# Patient Record
Sex: Female | Born: 1970 | Race: Black or African American | Hispanic: No | Smoking: Former smoker
Health system: Southern US, Community
[De-identification: ages and names within clinical notes are randomized; demographics above are authoritative.]

## PROBLEM LIST (undated history)

## (undated) DIAGNOSIS — B20 Human immunodeficiency virus [HIV] disease: Secondary | ICD-10-CM

## (undated) DIAGNOSIS — M1990 Unspecified osteoarthritis, unspecified site: Secondary | ICD-10-CM

## (undated) DIAGNOSIS — B9689 Other specified bacterial agents as the cause of diseases classified elsewhere: Secondary | ICD-10-CM

## (undated) DIAGNOSIS — F32A Depression, unspecified: Secondary | ICD-10-CM

## (undated) DIAGNOSIS — F329 Major depressive disorder, single episode, unspecified: Secondary | ICD-10-CM

## (undated) DIAGNOSIS — F419 Anxiety disorder, unspecified: Secondary | ICD-10-CM

## (undated) DIAGNOSIS — N760 Acute vaginitis: Secondary | ICD-10-CM

## (undated) DIAGNOSIS — G510 Bell's palsy: Secondary | ICD-10-CM

## (undated) DIAGNOSIS — E669 Obesity, unspecified: Secondary | ICD-10-CM

## (undated) DIAGNOSIS — I10 Essential (primary) hypertension: Secondary | ICD-10-CM

## (undated) HISTORY — PX: CHOLECYSTECTOMY: SHX55

---

## 2004-04-22 ENCOUNTER — Other Ambulatory Visit: Payer: Self-pay

## 2004-10-20 ENCOUNTER — Emergency Department: Payer: Self-pay | Admitting: Emergency Medicine

## 2004-12-23 ENCOUNTER — Emergency Department: Payer: Self-pay | Admitting: Unknown Physician Specialty

## 2005-03-15 ENCOUNTER — Emergency Department: Payer: Self-pay | Admitting: Internal Medicine

## 2005-03-26 ENCOUNTER — Emergency Department: Payer: Self-pay | Admitting: Emergency Medicine

## 2005-08-14 ENCOUNTER — Ambulatory Visit: Payer: Self-pay | Admitting: Family Medicine

## 2005-08-24 ENCOUNTER — Ambulatory Visit: Payer: Self-pay | Admitting: Family Medicine

## 2005-10-11 ENCOUNTER — Emergency Department: Payer: Self-pay | Admitting: Unknown Physician Specialty

## 2006-01-27 ENCOUNTER — Emergency Department: Payer: Self-pay | Admitting: Internal Medicine

## 2006-03-23 ENCOUNTER — Emergency Department: Payer: Self-pay | Admitting: General Practice

## 2006-06-22 ENCOUNTER — Emergency Department: Payer: Self-pay | Admitting: Emergency Medicine

## 2006-06-23 ENCOUNTER — Other Ambulatory Visit: Payer: Self-pay

## 2006-08-30 ENCOUNTER — Ambulatory Visit: Payer: Self-pay | Admitting: Internal Medicine

## 2006-08-31 ENCOUNTER — Emergency Department: Payer: Self-pay | Admitting: Emergency Medicine

## 2007-01-05 ENCOUNTER — Inpatient Hospital Stay: Payer: Self-pay | Admitting: Internal Medicine

## 2007-01-05 ENCOUNTER — Other Ambulatory Visit: Payer: Self-pay

## 2007-01-21 ENCOUNTER — Emergency Department: Payer: Self-pay | Admitting: Emergency Medicine

## 2007-01-31 ENCOUNTER — Emergency Department: Payer: Self-pay | Admitting: Emergency Medicine

## 2007-02-10 ENCOUNTER — Emergency Department: Payer: Self-pay | Admitting: Emergency Medicine

## 2007-03-22 ENCOUNTER — Other Ambulatory Visit: Payer: Self-pay

## 2007-03-22 ENCOUNTER — Emergency Department: Payer: Self-pay | Admitting: Emergency Medicine

## 2007-04-09 ENCOUNTER — Emergency Department: Payer: Self-pay | Admitting: Unknown Physician Specialty

## 2007-05-04 ENCOUNTER — Emergency Department: Payer: Self-pay | Admitting: Emergency Medicine

## 2007-05-04 ENCOUNTER — Other Ambulatory Visit: Payer: Self-pay

## 2007-05-11 ENCOUNTER — Other Ambulatory Visit: Payer: Self-pay

## 2007-05-11 ENCOUNTER — Emergency Department: Payer: Self-pay | Admitting: Emergency Medicine

## 2007-07-09 ENCOUNTER — Inpatient Hospital Stay: Payer: Self-pay | Admitting: Internal Medicine

## 2007-07-09 ENCOUNTER — Other Ambulatory Visit: Payer: Self-pay

## 2007-10-27 ENCOUNTER — Emergency Department: Payer: Self-pay | Admitting: Emergency Medicine

## 2007-10-27 ENCOUNTER — Other Ambulatory Visit: Payer: Self-pay

## 2007-11-23 ENCOUNTER — Emergency Department: Payer: Self-pay | Admitting: Emergency Medicine

## 2007-12-23 ENCOUNTER — Emergency Department: Payer: Self-pay | Admitting: Emergency Medicine

## 2008-01-08 ENCOUNTER — Other Ambulatory Visit: Payer: Self-pay

## 2008-01-08 ENCOUNTER — Emergency Department: Payer: Self-pay | Admitting: Emergency Medicine

## 2008-01-23 ENCOUNTER — Other Ambulatory Visit: Payer: Self-pay

## 2008-01-24 ENCOUNTER — Observation Stay: Payer: Self-pay | Admitting: Internal Medicine

## 2008-01-30 ENCOUNTER — Emergency Department: Payer: Self-pay | Admitting: Emergency Medicine

## 2008-01-30 ENCOUNTER — Other Ambulatory Visit: Payer: Self-pay

## 2008-06-12 ENCOUNTER — Emergency Department: Payer: Self-pay | Admitting: Emergency Medicine

## 2008-06-12 ENCOUNTER — Other Ambulatory Visit: Payer: Self-pay

## 2008-07-03 ENCOUNTER — Emergency Department: Payer: Self-pay | Admitting: Emergency Medicine

## 2008-07-03 ENCOUNTER — Other Ambulatory Visit: Payer: Self-pay

## 2008-08-24 ENCOUNTER — Other Ambulatory Visit: Payer: Self-pay

## 2008-08-24 ENCOUNTER — Emergency Department: Payer: Self-pay | Admitting: Emergency Medicine

## 2008-08-31 ENCOUNTER — Other Ambulatory Visit: Payer: Self-pay

## 2008-08-31 ENCOUNTER — Inpatient Hospital Stay: Payer: Self-pay | Admitting: Internal Medicine

## 2008-10-18 ENCOUNTER — Emergency Department: Payer: Self-pay | Admitting: Emergency Medicine

## 2008-11-04 ENCOUNTER — Emergency Department: Payer: Self-pay | Admitting: Emergency Medicine

## 2009-01-17 IMAGING — CR DG CHEST 1V PORT
1 series · 1 of 1 positions shown · non-contrast
Comparison: none

REASON FOR EXAM: SOB - RM 1
COMMENTS:

PROCEDURE:     DXR - DXR PORTABLE CHEST SINGLE VIEW  - July 09, 2007  [DATE]
RESULT:      The lung fields are clear. No pneumonia, pneumothorax or
pleural effusion is seen.  No interstitial or pulmonary edema is noted. The
heart size is normal.

[view not recorded]
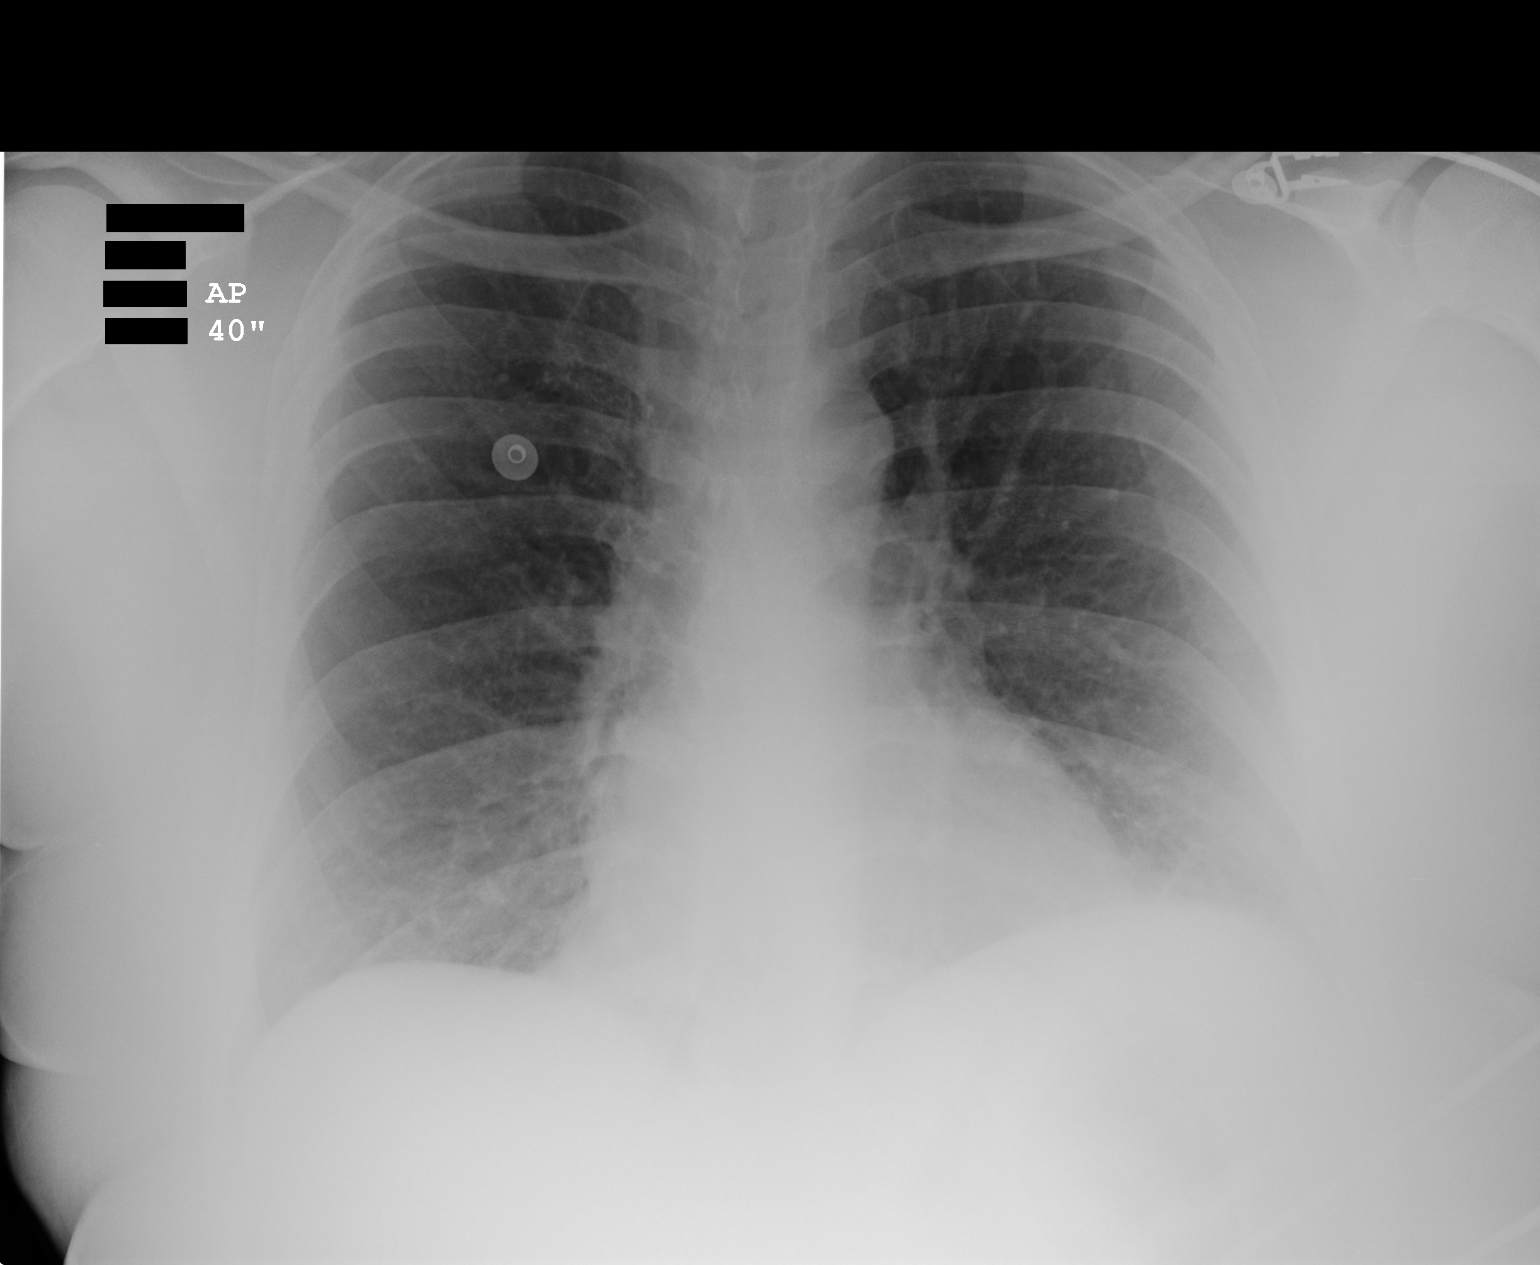

[1 of 1 positions shown; findings below may reference images not displayed]

IMPRESSION: No acute changes are identified.

## 2009-02-01 ENCOUNTER — Inpatient Hospital Stay: Payer: Self-pay | Admitting: Internal Medicine

## 2009-07-19 IMAGING — CR DG CHEST 2V
1 series · 2 of 2 positions shown · non-contrast
Comparison: none

REASON FOR EXAM: shortness of breath, chest pain
COMMENTS:

PROCEDURE:     DXR - DXR CHEST PA (OR AP) AND LATERAL  - January 08, 2008  [DATE]
RESULT:     Comparison: 12/23/2007.

[Series 1: view not recorded · 0.17mm/px · 2 of 2 slices shown]
[im 1/2]
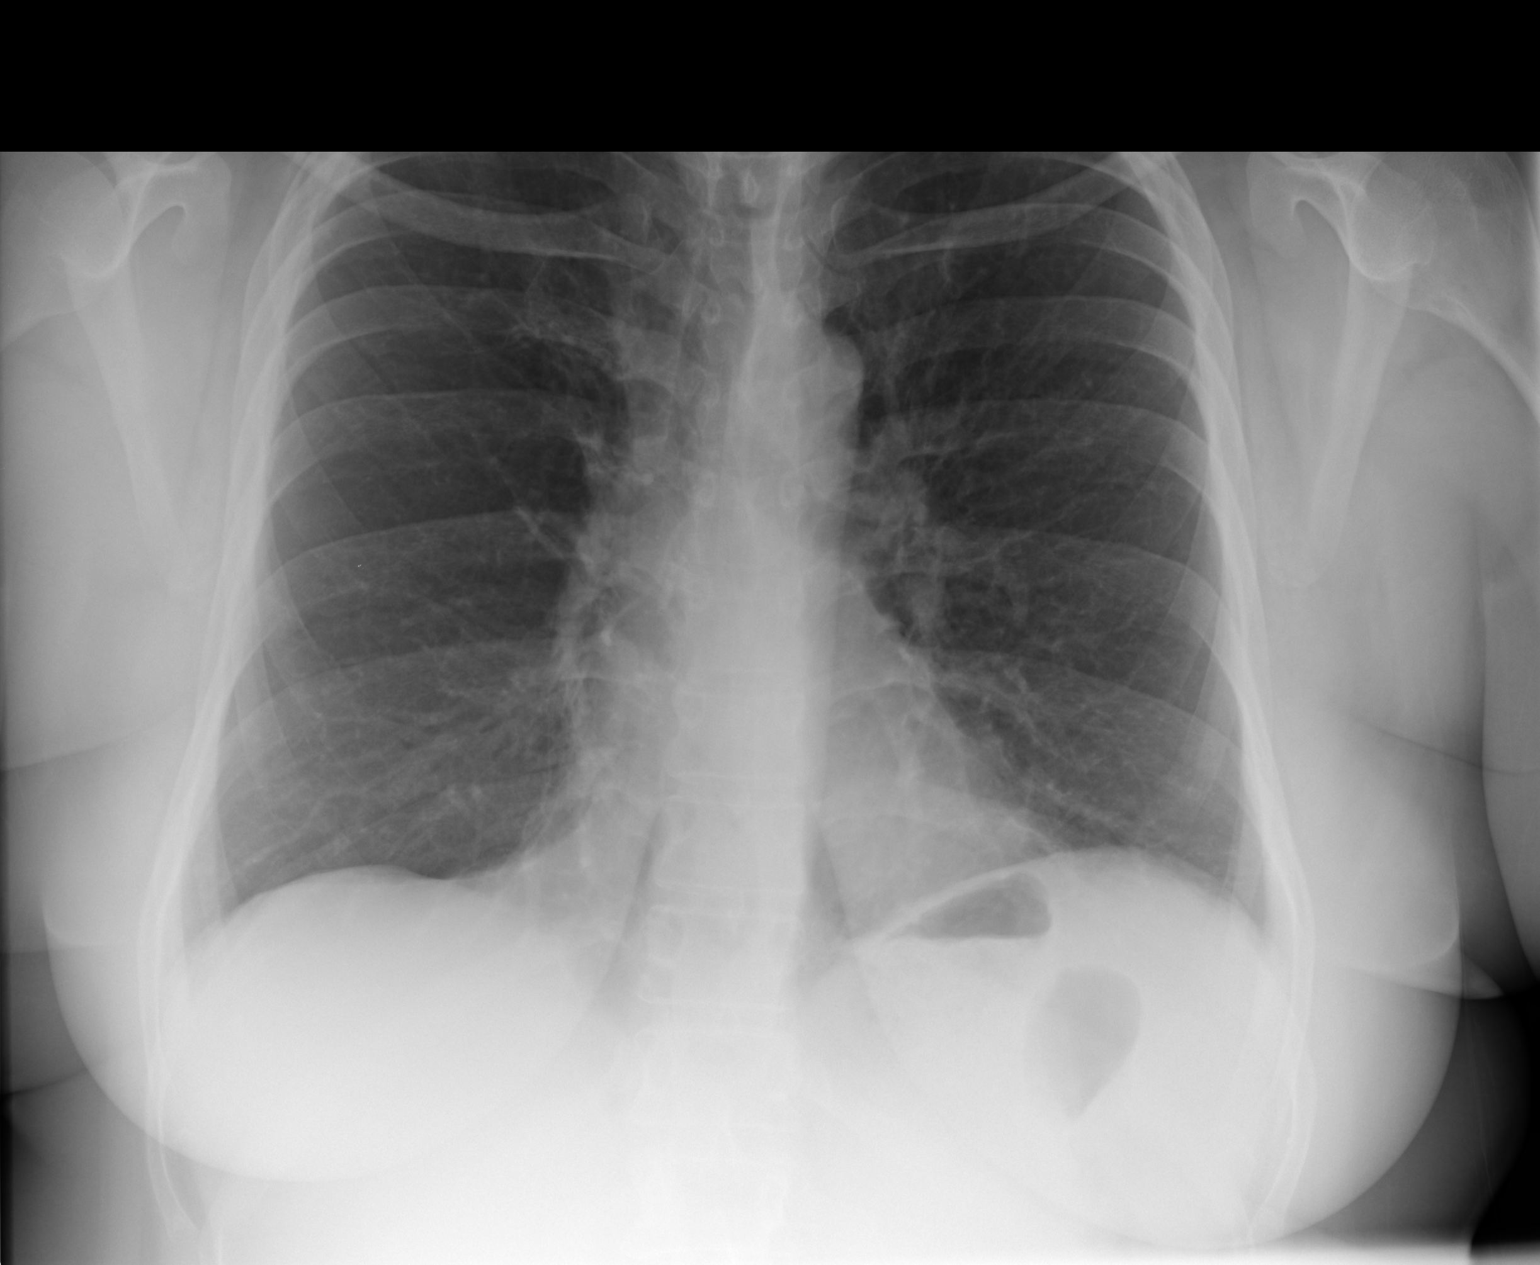
[im 2/2]
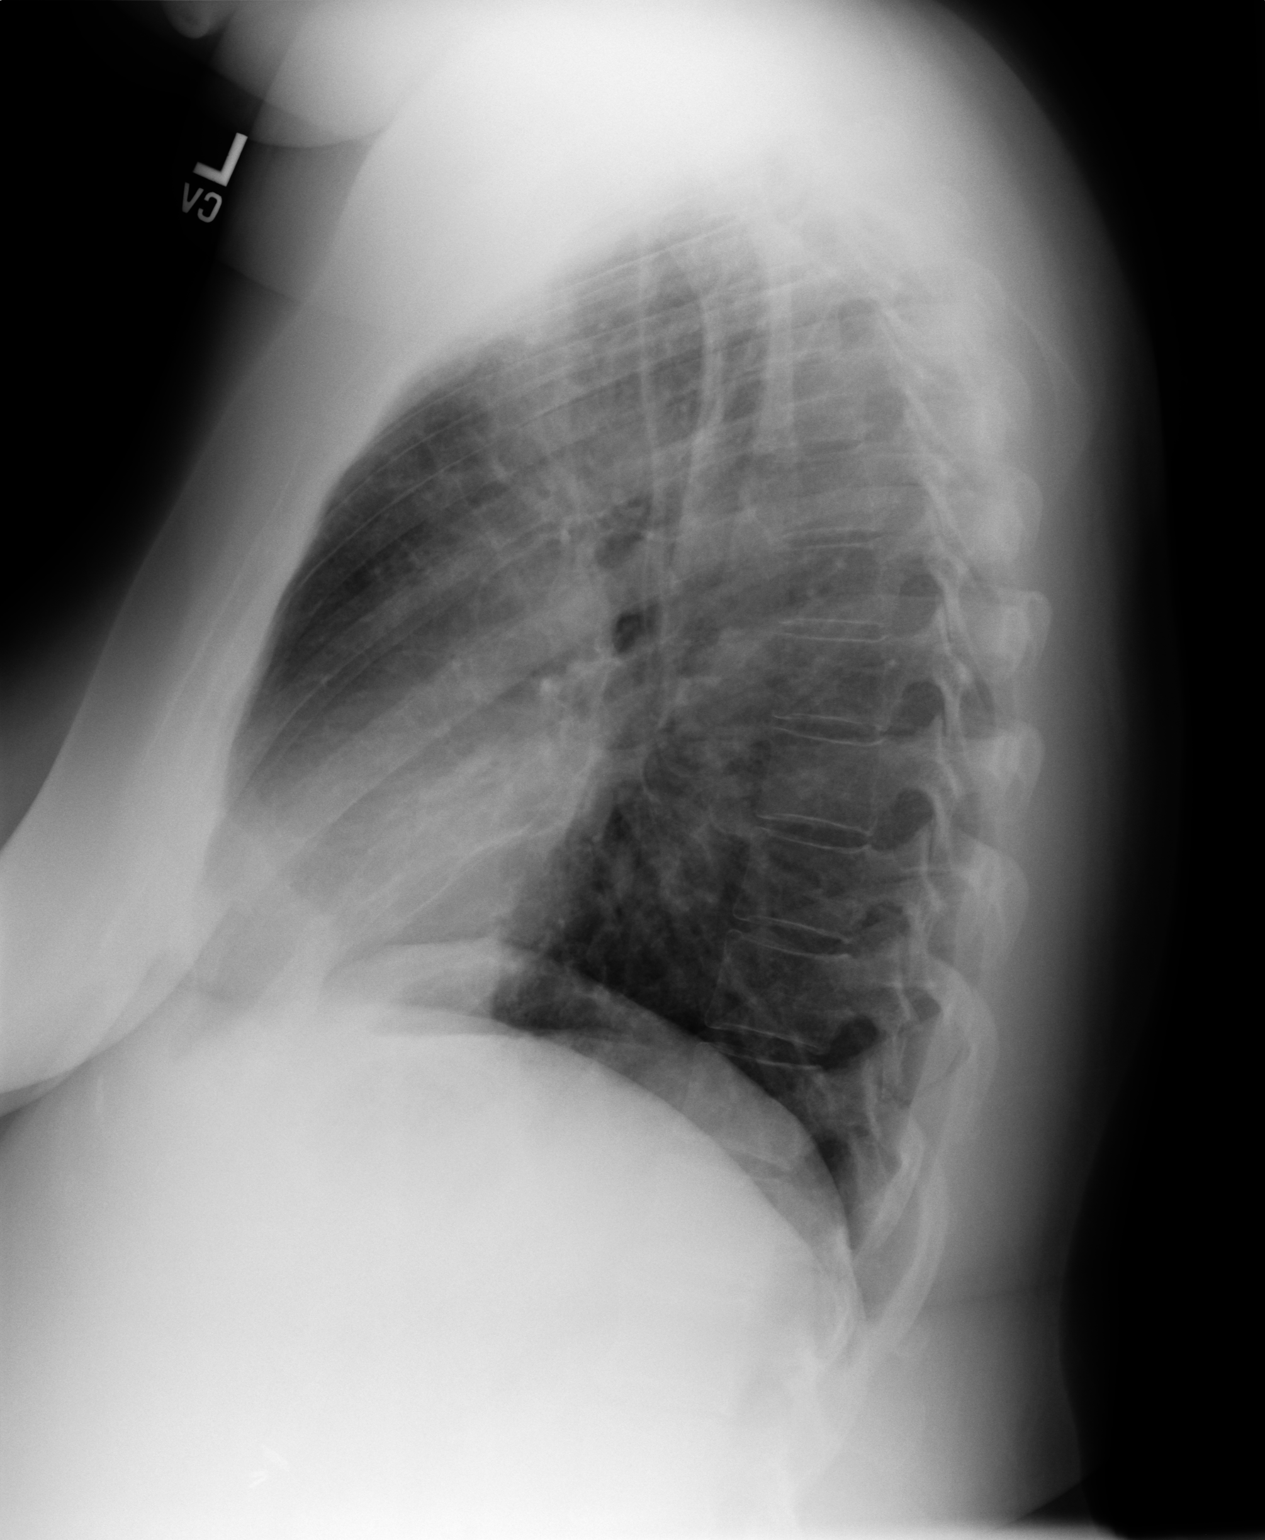

[2 of 2 positions shown; findings below may reference images not displayed]

FINDINGS: There is no significant pulmonary consolidation, pulmonary edema, pleural
effusion, nor pneumothorax. The cardiomediastinal silhouette is within
normal limits.

No grossly displaced rib fracture is noted.
IMPRESSION: 1. No acute cardiopulmonary abnormality is noted.

## 2010-02-25 ENCOUNTER — Emergency Department (HOSPITAL_COMMUNITY): Admission: EM | Admit: 2010-02-25 | Discharge: 2010-02-25 | Payer: Self-pay | Admitting: Emergency Medicine

## 2010-04-20 ENCOUNTER — Encounter: Admission: RE | Admit: 2010-04-20 | Discharge: 2010-04-20 | Payer: Self-pay | Admitting: Internal Medicine

## 2010-06-07 ENCOUNTER — Encounter: Admission: RE | Admit: 2010-06-07 | Discharge: 2010-06-07 | Payer: Self-pay | Admitting: Internal Medicine

## 2010-07-22 ENCOUNTER — Inpatient Hospital Stay (HOSPITAL_COMMUNITY): Admission: EM | Admit: 2010-07-22 | Discharge: 2010-07-24 | Payer: Self-pay | Admitting: Emergency Medicine

## 2010-07-24 ENCOUNTER — Encounter (INDEPENDENT_AMBULATORY_CARE_PROVIDER_SITE_OTHER): Payer: Self-pay | Admitting: Internal Medicine

## 2010-07-24 ENCOUNTER — Ambulatory Visit: Payer: Self-pay | Admitting: Surgery

## 2010-11-28 ENCOUNTER — Encounter: Admission: RE | Admit: 2010-11-28 | Discharge: 2010-11-28 | Payer: Self-pay | Admitting: Internal Medicine

## 2011-01-14 ENCOUNTER — Encounter: Payer: Self-pay | Admitting: Internal Medicine

## 2011-01-15 ENCOUNTER — Emergency Department (HOSPITAL_COMMUNITY)
Admission: EM | Admit: 2011-01-15 | Discharge: 2011-01-15 | Payer: Self-pay | Source: Home / Self Care | Admitting: Emergency Medicine

## 2011-01-16 LAB — URINALYSIS, ROUTINE W REFLEX MICROSCOPIC
Bilirubin Urine: NEGATIVE
Hgb urine dipstick: NEGATIVE
Ketones, ur: NEGATIVE mg/dL
Protein, ur: NEGATIVE mg/dL
Urobilinogen, UA: 1 mg/dL (ref 0.0–1.0)

## 2011-03-09 LAB — BASIC METABOLIC PANEL
BUN: 9 mg/dL (ref 6–23)
CO2: 27 mEq/L (ref 19–32)
Calcium: 8.6 mg/dL (ref 8.4–10.5)
Creatinine, Ser: 0.73 mg/dL (ref 0.4–1.2)
GFR calc Af Amer: >60 mL/min (ref >60)
Glucose, Bld: 299 mg/dL — ABNORMAL HIGH (ref 70–99)

## 2011-03-09 LAB — CBC
MCH: 24.7 pg — ABNORMAL LOW (ref 26.0–34.0)
MCHC: 32.3 g/dL (ref 30.0–36.0)
Platelets: 537 10*3/uL — ABNORMAL HIGH (ref 150–400)
RDW: 16 % — ABNORMAL HIGH (ref 11.5–15.5)

## 2011-03-09 LAB — GLUCOSE, CAPILLARY: Glucose-Capillary: 282 mg/dL — ABNORMAL HIGH (ref 70–99)

## 2011-03-10 LAB — BASIC METABOLIC PANEL
CO2: 24 mEq/L (ref 19–32)
Calcium: 8.6 mg/dL (ref 8.4–10.5)
Creatinine, Ser: 0.64 mg/dL (ref 0.4–1.2)
GFR calc Af Amer: >60 mL/min (ref >60)
GFR calc non Af Amer: >60 mL/min (ref >60)
Sodium: 135 mEq/L (ref 135–145)

## 2011-03-10 LAB — DIFFERENTIAL
Basophils Relative: 0 % (ref 0–1)
Eosinophils Absolute: 0 10*3/uL (ref 0.0–0.7)
Eosinophils Absolute: 0 10*3/uL (ref 0.0–0.7)
Eosinophils Relative: 0 % (ref 0–5)
Lymphocytes Relative: 12 % (ref 12–46)
Lymphs Abs: 0.9 10*3/uL (ref 0.7–4.0)
Monocytes Relative: 1 % — ABNORMAL LOW (ref 3–12)
Neutro Abs: 6.7 10*3/uL (ref 1.7–7.7)
Neutrophils Relative %: 87 % — ABNORMAL HIGH (ref 43–77)
Neutrophils Relative %: 95 % — ABNORMAL HIGH (ref 43–77)

## 2011-03-10 LAB — COMPREHENSIVE METABOLIC PANEL
ALT: 9 U/L (ref 0–35)
AST: 14 U/L (ref 0–37)
Albumin: 2.8 g/dL — ABNORMAL LOW (ref 3.5–5.2)
Alkaline Phosphatase: 92 U/L (ref 39–117)
CO2: 25 mEq/L (ref 19–32)
Chloride: 105 mEq/L (ref 96–112)
GFR calc Af Amer: >60 mL/min (ref >60)
GFR calc non Af Amer: >60 mL/min (ref >60)
Potassium: 4.2 mEq/L (ref 3.5–5.1)
Sodium: 137 mEq/L (ref 135–145)
Total Bilirubin: 0.2 mg/dL — ABNORMAL LOW (ref 0.3–1.2)

## 2011-03-10 LAB — POCT I-STAT, CHEM 8
BUN: 7 mg/dL (ref 6–23)
Creatinine, Ser: 0.5 mg/dL (ref 0.4–1.2)
Glucose, Bld: 288 mg/dL — ABNORMAL HIGH (ref 70–99)
Hemoglobin: 12.9 g/dL (ref 12.0–15.0)
Potassium: 3.8 mEq/L (ref 3.5–5.1)
TCO2: 21 mmol/L (ref 0–100)

## 2011-03-10 LAB — GLUCOSE, CAPILLARY
Glucose-Capillary: 202 mg/dL — ABNORMAL HIGH (ref 70–99)
Glucose-Capillary: 211 mg/dL — ABNORMAL HIGH (ref 70–99)
Glucose-Capillary: 259 mg/dL — ABNORMAL HIGH (ref 70–99)
Glucose-Capillary: 361 mg/dL — ABNORMAL HIGH (ref 70–99)

## 2011-03-10 LAB — CBC
Hemoglobin: 11.4 g/dL — ABNORMAL LOW (ref 12.0–15.0)
Hemoglobin: 11.8 g/dL — ABNORMAL LOW (ref 12.0–15.0)
MCH: 24.6 pg — ABNORMAL LOW (ref 26.0–34.0)
MCH: 24.6 pg — ABNORMAL LOW (ref 26.0–34.0)
MCH: 24.8 pg — ABNORMAL LOW (ref 26.0–34.0)
MCHC: 32.8 g/dL (ref 30.0–36.0)
MCV: 75 fL — ABNORMAL LOW (ref 78.0–100.0)
Platelets: 487 10*3/uL — ABNORMAL HIGH (ref 150–400)
Platelets: 495 10*3/uL — ABNORMAL HIGH (ref 150–400)
RBC: 4.58 MIL/uL (ref 3.87–5.11)
RBC: 4.78 MIL/uL (ref 3.87–5.11)
WBC: 19.7 10*3/uL — ABNORMAL HIGH (ref 4.0–10.5)
WBC: 7.7 10*3/uL (ref 4.0–10.5)

## 2011-03-10 LAB — POCT CARDIAC MARKERS: Myoglobin, poc: 44.1 ng/mL (ref 12–200)

## 2011-03-10 LAB — TROPONIN I: Troponin I: 0.01 ng/mL (ref 0.00–0.06)

## 2011-03-10 LAB — CARDIAC PANEL(CRET KIN+CKTOT+MB+TROPI)
CK, MB: 1.3 ng/mL (ref 0.3–4.0)
Total CK: 78 U/L (ref 7–177)

## 2011-03-10 LAB — CK TOTAL AND CKMB (NOT AT ARMC)
CK, MB: 1.4 ng/mL (ref 0.3–4.0)
Total CK: 95 U/L (ref 7–177)

## 2011-03-10 LAB — HEMOGLOBIN A1C: Hgb A1c MFr Bld: 6.5 % — ABNORMAL HIGH (ref <5.7)

## 2011-06-13 ENCOUNTER — Other Ambulatory Visit: Payer: Self-pay | Admitting: Internal Medicine

## 2011-06-13 DIAGNOSIS — N63 Unspecified lump in unspecified breast: Secondary | ICD-10-CM

## 2011-06-21 ENCOUNTER — Ambulatory Visit
Admission: RE | Admit: 2011-06-21 | Discharge: 2011-06-21 | Disposition: A | Discharge status: Home / Self Care | Payer: Medicaid Other | Source: Ambulatory Visit | Attending: Internal Medicine | Admitting: Internal Medicine

## 2011-06-21 DIAGNOSIS — N63 Unspecified lump in unspecified breast: Secondary | ICD-10-CM

## 2011-08-15 ENCOUNTER — Ambulatory Visit (HOSPITAL_COMMUNITY)
Admission: RE | Admit: 2011-08-15 | Discharge: 2011-08-15 | Disposition: A | Discharge status: Home / Self Care | Payer: Medicaid Other | Source: Ambulatory Visit | Attending: Emergency Medicine | Admitting: Emergency Medicine

## 2011-08-15 ENCOUNTER — Inpatient Hospital Stay (INDEPENDENT_AMBULATORY_CARE_PROVIDER_SITE_OTHER)
Admission: RE | Admit: 2011-08-15 | Discharge: 2011-08-15 | Disposition: A | Discharge status: Home / Self Care | Payer: Medicaid Other | Source: Ambulatory Visit | Attending: Family Medicine | Admitting: Family Medicine

## 2011-08-15 DIAGNOSIS — I10 Essential (primary) hypertension: Secondary | ICD-10-CM | POA: Insufficient documentation

## 2011-08-15 DIAGNOSIS — M7989 Other specified soft tissue disorders: Secondary | ICD-10-CM | POA: Insufficient documentation

## 2011-08-15 DIAGNOSIS — J45909 Unspecified asthma, uncomplicated: Secondary | ICD-10-CM | POA: Insufficient documentation

## 2011-08-15 DIAGNOSIS — F3289 Other specified depressive episodes: Secondary | ICD-10-CM | POA: Insufficient documentation

## 2011-08-15 DIAGNOSIS — M79609 Pain in unspecified limb: Secondary | ICD-10-CM | POA: Insufficient documentation

## 2011-08-15 DIAGNOSIS — F329 Major depressive disorder, single episode, unspecified: Secondary | ICD-10-CM | POA: Insufficient documentation

## 2011-08-15 DIAGNOSIS — F411 Generalized anxiety disorder: Secondary | ICD-10-CM | POA: Insufficient documentation

## 2011-08-15 DIAGNOSIS — E119 Type 2 diabetes mellitus without complications: Secondary | ICD-10-CM | POA: Insufficient documentation

## 2011-08-20 ENCOUNTER — Other Ambulatory Visit: Payer: Self-pay | Admitting: Orthopedic Surgery

## 2011-08-20 DIAGNOSIS — R609 Edema, unspecified: Secondary | ICD-10-CM

## 2011-08-20 DIAGNOSIS — M25572 Pain in left ankle and joints of left foot: Secondary | ICD-10-CM

## 2011-08-28 ENCOUNTER — Ambulatory Visit
Admission: RE | Admit: 2011-08-28 | Discharge: 2011-08-28 | Disposition: A | Discharge status: Home / Self Care | Payer: Medicaid Other | Source: Ambulatory Visit | Attending: Orthopedic Surgery | Admitting: Orthopedic Surgery

## 2011-08-28 DIAGNOSIS — R609 Edema, unspecified: Secondary | ICD-10-CM

## 2011-08-28 DIAGNOSIS — M25572 Pain in left ankle and joints of left foot: Secondary | ICD-10-CM

## 2011-08-29 ENCOUNTER — Ambulatory Visit (HOSPITAL_COMMUNITY)
Admission: RE | Admit: 2011-08-29 | Discharge: 2011-08-29 | Disposition: A | Discharge status: Home / Self Care | Payer: Medicaid Other | Source: Ambulatory Visit | Attending: Emergency Medicine | Admitting: Emergency Medicine

## 2011-08-29 DIAGNOSIS — M79609 Pain in unspecified limb: Secondary | ICD-10-CM | POA: Insufficient documentation

## 2011-08-29 DIAGNOSIS — I10 Essential (primary) hypertension: Secondary | ICD-10-CM | POA: Insufficient documentation

## 2011-08-29 DIAGNOSIS — E119 Type 2 diabetes mellitus without complications: Secondary | ICD-10-CM | POA: Insufficient documentation

## 2011-12-29 ENCOUNTER — Emergency Department: Payer: Self-pay | Admitting: Emergency Medicine

## 2012-04-25 ENCOUNTER — Other Ambulatory Visit: Payer: Self-pay | Admitting: Internal Medicine

## 2012-04-25 DIAGNOSIS — Z1231 Encounter for screening mammogram for malignant neoplasm of breast: Secondary | ICD-10-CM

## 2012-05-07 ENCOUNTER — Ambulatory Visit: Payer: Self-pay

## 2012-05-10 ENCOUNTER — Encounter (HOSPITAL_COMMUNITY): Payer: Self-pay | Admitting: *Deleted

## 2012-05-10 ENCOUNTER — Emergency Department (HOSPITAL_COMMUNITY)
Admission: EM | Admit: 2012-05-10 | Discharge: 2012-05-11 | Disposition: A | Discharge status: Home / Self Care | Payer: Medicaid Other | Attending: Emergency Medicine | Admitting: Emergency Medicine

## 2012-05-10 DIAGNOSIS — J320 Chronic maxillary sinusitis: Secondary | ICD-10-CM | POA: Insufficient documentation

## 2012-05-10 DIAGNOSIS — R51 Headache: Secondary | ICD-10-CM | POA: Insufficient documentation

## 2012-05-10 DIAGNOSIS — Z79899 Other long term (current) drug therapy: Secondary | ICD-10-CM | POA: Insufficient documentation

## 2012-05-10 DIAGNOSIS — J45909 Unspecified asthma, uncomplicated: Secondary | ICD-10-CM | POA: Insufficient documentation

## 2012-05-10 DIAGNOSIS — M542 Cervicalgia: Secondary | ICD-10-CM

## 2012-05-10 DIAGNOSIS — M62838 Other muscle spasm: Secondary | ICD-10-CM | POA: Insufficient documentation

## 2012-05-10 DIAGNOSIS — G8929 Other chronic pain: Secondary | ICD-10-CM | POA: Insufficient documentation

## 2012-05-10 DIAGNOSIS — R0981 Nasal congestion: Secondary | ICD-10-CM

## 2012-05-10 DIAGNOSIS — M25579 Pain in unspecified ankle and joints of unspecified foot: Secondary | ICD-10-CM | POA: Insufficient documentation

## 2012-05-10 DIAGNOSIS — E119 Type 2 diabetes mellitus without complications: Secondary | ICD-10-CM | POA: Insufficient documentation

## 2012-05-10 DIAGNOSIS — J321 Chronic frontal sinusitis: Secondary | ICD-10-CM | POA: Insufficient documentation

## 2012-05-10 DIAGNOSIS — I10 Essential (primary) hypertension: Secondary | ICD-10-CM | POA: Insufficient documentation

## 2012-05-10 DIAGNOSIS — M129 Arthropathy, unspecified: Secondary | ICD-10-CM | POA: Insufficient documentation

## 2012-05-10 HISTORY — DX: Essential (primary) hypertension: I10

## 2012-05-10 HISTORY — DX: Unspecified osteoarthritis, unspecified site: M19.90

## 2012-05-10 MED ORDER — KETOROLAC TROMETHAMINE 30 MG/ML IJ SOLN
30.0000 mg | Freq: Once | INTRAMUSCULAR | Status: AC
  Administered 2012-05-10: 30 mg via INTRAMUSCULAR
  Filled 2012-05-10: qty 1

## 2012-05-10 MED ORDER — PSEUDOEPHEDRINE HCL ER 120 MG PO TB12
120.0000 mg | ORAL_TABLET | Freq: Two times a day (BID) | ORAL | Status: DC
  Administered 2012-05-10: 120 mg via ORAL
  Filled 2012-05-10: qty 1

## 2012-05-10 NOTE — ED Notes (Signed)
Pt alert, nad, c/o headache, onset was yesterday, pt speech clear, ambulates to triage, steady gait, noted, resp even unlabored, skin pwd

## 2012-05-10 NOTE — ED Provider Notes (Signed)
History     CSN: [id number]  Arrival date & time 05/10/12  2126   First MD Initiated Contact with Patient 05/10/12 2258      Chief Complaint  Patient presents with  . Headache    (Consider location/radiation/quality/duration/timing/severity/associated sxs/prior treatment) HPI Comments: Patient states that she has recurrent headaches, usually lasting 2 days.  She can take over-the-counter Tylenol with relief.  Today's headache has been there for 2 days.  She has not taken any Tylenol today.  She has facial tenderness and fullness, worse when she bends over and touches her toes.  She does have seasonal allergies, for which she takes serotonin without a decongestant.  She also is complaining of left side.  Pain.  She has pain in her left ankle from a "tendinitis" but her arm.  Has been painful as well.  Pain starts at the base of her neck and radiates down to her fingertips  Patient is a 41 y.o. female presenting with headaches. The history is provided by the patient.  Headache  This is a recurrent problem. The current episode started yesterday. The problem occurs constantly. The problem has not changed since onset.The pain is located in the frontal region. The quality of the pain is described as dull. The pain is at a severity of 5/10. The pain is moderate. The pain does not radiate. Pertinent negatives include no fever, no chest pressure, no syncope, no shortness of breath, no nausea and no vomiting. She has tried nothing for the symptoms.    Past Medical History  Diagnosis Date  . Diabetes mellitus   . Arthritis   . Asthma   . Hypertension     Past Surgical History  Procedure Date  . Cholecystectomy     History reviewed. No pertinent family history.  History  Substance Use Topics  . Smoking status: Never Smoker   . Smokeless tobacco: Not on file  . Alcohol Use:      2/day    OB History    Grav Para Term Preterm Abortions TAB SAB Ect Mult Living                   Review of Systems  Constitutional: Negative for fever and chills.  HENT: Positive for hearing loss, congestion, neck pain and sinus pressure. Negative for ear pain, rhinorrhea, trouble swallowing and neck stiffness.   Respiratory: Negative for shortness of breath and wheezing.   Cardiovascular: Negative for syncope.  Gastrointestinal: Negative for nausea and vomiting.  Musculoskeletal: Negative for back pain and joint swelling.  Skin: Negative for pallor, rash and wound.  Neurological: Positive for headaches. Negative for dizziness, weakness and numbness.    Allergies  Penicillins and Shellfish allergy  Home Medications   Current Outpatient Rx  Name Route Sig Dispense Refill  . ALBUTEROL SULFATE (2.5 MG/3ML) 0.083% IN NEBU Nebulization Take 2.5 mg by nebulization every 6 (six) hours as needed. For shortness of breath    . IPRATROPIUM-ALBUTEROL 18-103 MCG/ACT IN AERO Inhalation Inhale 2 puffs into the lungs 4 (four) times daily.    . BECLOMETHASONE DIPROPIONATE 80 MCG/ACT IN AERS Inhalation Inhale 1 puff into the lungs 2 (two) times daily.    Marland Kitchen CETIRIZINE HCL 10 MG PO TABS Oral Take 10 mg by mouth daily.    Marland Kitchen METFORMIN HCL 500 MG PO TABS Oral Take 500 mg by mouth 2 (two) times daily with a meal.    . NAPROXEN 500 MG PO TABS Oral Take 500 mg by mouth  2 (two) times daily with a meal.    . QUETIAPINE FUMARATE 100 MG PO TABS Oral Take 100-200 mg by mouth at bedtime.    . TRAZODONE HCL 150 MG PO TABS Oral Take 150 mg by mouth at bedtime.    Marland Kitchen PSEUDOEPHEDRINE HCL ER 120 MG PO TB12 Oral Take 1 tablet (120 mg total) by mouth 2 (two) times daily. 60 tablet 0  . TRAMADOL HCL 50 MG PO TABS Oral Take 1 tablet (50 mg total) by mouth every 6 (six) hours as needed for pain. 15 tablet 0    BP 112/64  Pulse 70  Temp(Src) 98.9 F (37.2 C) (Oral)  Resp 18  SpO2 98%  LMP 03/30/2012  Physical Exam  Constitutional: She appears well-developed and well-nourished.  HENT:  Head: Normocephalic.   Right Ear: External ear normal.  Left Ear: External ear normal.  Nose: Right sinus exhibits maxillary sinus tenderness and frontal sinus tenderness. Left sinus exhibits maxillary sinus tenderness and frontal sinus tenderness.  Mouth/Throat: No oropharyngeal exudate.  Neck:    Cardiovascular: Normal rate.   Pulmonary/Chest: Effort normal.  Musculoskeletal:       Arms: Neurological: She is alert.  Skin: Skin is warm and dry. No rash noted.    ED Course  Procedures (including critical care time)  Labs Reviewed - No data to display No results found.   1. Sinus congestion   2. Cervical muscle pain     Approximate one-hour after patient received by mouth Sudafed and IM Toradol.  She reports almost complete resolution of her headache and neck pain  MDM  Sinusitis, cervical neck spasm as well as chronic L ankle pain         Arman Filter, NP 05/11/12 0110

## 2012-05-10 NOTE — ED Notes (Addendum)
Pt with a headache for several days that comes and goes. Currently 6/10 in face and in left temporal area. Pt also complains of "achin' feeling" in entire left side of body. Pt appears comfortable. Denies n/v, denies runny nose. Reports an infrequent cough. Pt reports regular activity and appetite.

## 2012-05-11 MED ORDER — TRAMADOL HCL 50 MG PO TABS: 50.0000 mg | ORAL_TABLET | Freq: Four times a day (QID) | ORAL | Status: AC | PRN

## 2012-05-11 MED ORDER — PSEUDOEPHEDRINE HCL ER 120 MG PO TB12: 120.0000 mg | ORAL_TABLET | Freq: Two times a day (BID) | ORAL | Status: DC

## 2012-05-11 NOTE — Discharge Instructions (Signed)

## 2012-05-11 NOTE — ED Provider Notes (Signed)
Medical screening examination/treatment/procedure(s) were performed by non-physician practitioner and as supervising physician I was immediately available for consultation/collaboration.   Dwayne Bulkley A Rosan Calbert, MD 05/11/12 0733 

## 2012-05-23 ENCOUNTER — Ambulatory Visit
Admission: RE | Admit: 2012-05-23 | Discharge: 2012-05-23 | Disposition: A | Discharge status: Home / Self Care | Payer: Medicaid Other | Source: Ambulatory Visit | Attending: Internal Medicine | Admitting: Internal Medicine

## 2012-05-23 DIAGNOSIS — Z1231 Encounter for screening mammogram for malignant neoplasm of breast: Secondary | ICD-10-CM

## 2012-05-29 ENCOUNTER — Other Ambulatory Visit: Payer: Self-pay | Admitting: Internal Medicine

## 2012-05-29 DIAGNOSIS — R928 Other abnormal and inconclusive findings on diagnostic imaging of breast: Secondary | ICD-10-CM

## 2012-07-02 ENCOUNTER — Ambulatory Visit
Admission: RE | Admit: 2012-07-02 | Discharge: 2012-07-02 | Disposition: A | Discharge status: Home / Self Care | Payer: Medicaid Other | Source: Ambulatory Visit | Attending: Internal Medicine | Admitting: Internal Medicine

## 2012-07-02 DIAGNOSIS — R928 Other abnormal and inconclusive findings on diagnostic imaging of breast: Secondary | ICD-10-CM

## 2012-10-27 ENCOUNTER — Encounter (HOSPITAL_COMMUNITY): Payer: Self-pay | Admitting: Emergency Medicine

## 2012-10-27 ENCOUNTER — Emergency Department (HOSPITAL_COMMUNITY)
Admission: EM | Admit: 2012-10-27 | Discharge: 2012-10-28 | Disposition: A | Discharge status: Home / Self Care | Payer: Medicaid Other | Attending: Emergency Medicine | Admitting: Emergency Medicine

## 2012-10-27 DIAGNOSIS — Z791 Long term (current) use of non-steroidal anti-inflammatories (NSAID): Secondary | ICD-10-CM | POA: Insufficient documentation

## 2012-10-27 DIAGNOSIS — G510 Bell's palsy: Secondary | ICD-10-CM | POA: Insufficient documentation

## 2012-10-27 DIAGNOSIS — Z79899 Other long term (current) drug therapy: Secondary | ICD-10-CM | POA: Insufficient documentation

## 2012-10-27 DIAGNOSIS — IMO0002 Reserved for concepts with insufficient information to code with codable children: Secondary | ICD-10-CM | POA: Insufficient documentation

## 2012-10-27 DIAGNOSIS — F3289 Other specified depressive episodes: Secondary | ICD-10-CM | POA: Insufficient documentation

## 2012-10-27 DIAGNOSIS — F411 Generalized anxiety disorder: Secondary | ICD-10-CM | POA: Insufficient documentation

## 2012-10-27 DIAGNOSIS — E119 Type 2 diabetes mellitus without complications: Secondary | ICD-10-CM | POA: Insufficient documentation

## 2012-10-27 DIAGNOSIS — J45909 Unspecified asthma, uncomplicated: Secondary | ICD-10-CM | POA: Insufficient documentation

## 2012-10-27 DIAGNOSIS — M129 Arthropathy, unspecified: Secondary | ICD-10-CM | POA: Insufficient documentation

## 2012-10-27 DIAGNOSIS — I10 Essential (primary) hypertension: Secondary | ICD-10-CM | POA: Insufficient documentation

## 2012-10-27 DIAGNOSIS — F329 Major depressive disorder, single episode, unspecified: Secondary | ICD-10-CM | POA: Insufficient documentation

## 2012-10-27 HISTORY — DX: Anxiety disorder, unspecified: F41.9

## 2012-10-27 HISTORY — DX: Major depressive disorder, single episode, unspecified: F32.9

## 2012-10-27 HISTORY — DX: Depression, unspecified: F32.A

## 2012-10-27 NOTE — ED Notes (Signed)
PT. REPORTS RIGHT EYE TWITCHING ONSET THIS MORNING .

## 2012-10-28 ENCOUNTER — Emergency Department (HOSPITAL_COMMUNITY): Payer: Medicaid Other

## 2012-10-28 MED ORDER — ACYCLOVIR 200 MG PO CAPS
400.0000 mg | ORAL_CAPSULE | Freq: Once | ORAL | Status: AC
  Administered 2012-10-28: 400 mg via ORAL
  Filled 2012-10-28: qty 2

## 2012-10-28 MED ORDER — PREDNISONE 20 MG PO TABS
60.0000 mg | ORAL_TABLET | Freq: Once | ORAL | Status: AC
  Administered 2012-10-28: 60 mg via ORAL
  Filled 2012-10-28: qty 3

## 2012-10-28 MED ORDER — PREDNISONE 20 MG PO TABS: ORAL_TABLET | ORAL | Status: DC

## 2012-10-28 MED ORDER — ACYCLOVIR 400 MG PO TABS: 400.0000 mg | ORAL_TABLET | Freq: Four times a day (QID) | ORAL | Status: DC

## 2012-10-28 NOTE — ED Notes (Signed)
Pt denies blurred vision or change in visual acutiy

## 2012-10-28 NOTE — ED Provider Notes (Signed)
History     CSN: [id number]  Arrival date & time 10/27/12  2049   First MD Initiated Contact with Patient 10/27/12 2356      Chief Complaint  Patient presents with  . Eye Problem    (Consider location/radiation/quality/duration/timing/severity/associated sxs/prior treatment) HPI Comments: Patient states she woke with R facial droop Also states she is taking her HTN meds regularly does not have HA, nausea, weakness States she does drool when drinking but not having any difficulty swallowing.    Patient is a 41 y.o. female presenting with eye problem. The history is provided by the patient.  Eye Problem  This is a new problem. The problem occurs constantly. The problem has not changed since onset.The right eye is affected.There was no injury mechanism. Pertinent negatives include no numbness, no blurred vision, no decreased vision, no double vision, no foreign body sensation, no photophobia, no eye redness, no nausea, no vomiting, no tingling, no weakness and no itching.    Past Medical History  Diagnosis Date  . Diabetes mellitus   . Arthritis   . Asthma   . Hypertension   . Depression   . Anxiety     Past Surgical History  Procedure Date  . Cholecystectomy     No family history on file.  History  Substance Use Topics  . Smoking status: Never Smoker   . Smokeless tobacco: Not on file  . Alcohol Use: Yes     Comment: 2/day    OB History    Grav Para Term Preterm Abortions TAB SAB Ect Mult Living                  Review of Systems  Constitutional: Negative for fever and chills.  HENT: Negative for ear pain, sore throat, rhinorrhea and trouble swallowing.   Eyes: Negative for blurred vision, double vision, photophobia, redness and visual disturbance.  Gastrointestinal: Negative for nausea and vomiting.  Musculoskeletal: Negative for back pain, joint swelling, arthralgias and gait problem.  Skin: Negative for itching, rash and wound.  Neurological: Negative for  dizziness, tingling, weakness, numbness and headaches.    Allergies  Penicillins and Shellfish allergy  Home Medications   Current Outpatient Rx  Name  Route  Sig  Dispense  Refill  . ALBUTEROL SULFATE (2.5 MG/3ML) 0.083% IN NEBU   Nebulization   Take 2.5 mg by nebulization every 6 (six) hours as needed. For shortness of breath         . IPRATROPIUM-ALBUTEROL 18-103 MCG/ACT IN AERO   Inhalation   Inhale 2 puffs into the lungs 4 (four) times daily.         . BECLOMETHASONE DIPROPIONATE 80 MCG/ACT IN AERS   Inhalation   Inhale 1 puff into the lungs 2 (two) times daily.         Marland Kitchen CETIRIZINE HCL 10 MG PO TABS   Oral   Take 10 mg by mouth daily.         Marland Kitchen METFORMIN HCL 500 MG PO TABS   Oral   Take 500 mg by mouth 2 (two) times daily with a meal.         . NAPROXEN 500 MG PO TABS   Oral   Take 500 mg by mouth 2 (two) times daily with a meal.         . QUETIAPINE FUMARATE 100 MG PO TABS   Oral   Take 100 mg by mouth at bedtime as needed. When needed for sleep         .  TRAZODONE HCL 150 MG PO TABS   Oral   Take 150 mg by mouth at bedtime as needed. For sleep         . ACYCLOVIR 400 MG PO TABS   Oral   Take 1 tablet (400 mg total) by mouth 4 (four) times daily.   50 tablet   0   . PREDNISONE 20 MG PO TABS      3 Tabs PO Days 1-3, then 2 tabs PO Days 4-6, then 1 tab PO Day 7-9, then Half Tab PO Day 10-12   20 tablet   0     BP 156/88  Pulse 73  Temp 98.1 F (36.7 C) (Oral)  Resp 14  SpO2 97%  LMP 10/26/2012  Physical Exam  Constitutional: She appears well-developed and well-nourished.  HENT:  Head: Normocephalic.  Eyes: Pupils are equal, round, and reactive to light.  Neck: Normal range of motion.  Cardiovascular: Normal rate.   Pulmonary/Chest: Effort normal.  Musculoskeletal: Normal range of motion. She exhibits no edema and no tenderness.  Neurological: She is alert. Coordination normal.       R facial droop, tounge deviates to the R  can raise eyebrows smile asymetric   Skin: Skin is warm and dry.    ED Course  Procedures (including critical care time)  Labs Reviewed - No data to display Ct Head Wo Contrast  10/28/2012  *RADIOLOGY REPORT*  Clinical Data: Right facial droop.  CT HEAD WITHOUT CONTRAST  Technique:  Contiguous axial images were obtained from the base of the skull through the vertex without contrast.  Comparison: None.  Findings: Ventricular system normal in size and appearance for age. No mass lesion.  No midline shift.  No acute hemorrhage or hematoma.  No extra-axial fluid collections.  No evidence of acute infarction.  No focal brain parenchymal abnormalities.  No focal osseous abnormalities involving the skull.  Visualized paranasal sinuses, mastoid air cells, and middle ear cavities well- aerated.  IMPRESSION: Normal examination.   Original Report Authenticated By: Hulan Saas, M.D.      1. Bell's palsy       MDM   CT scan reviewed negative for stroke.  Discharge home with prednisone, taper, as well as a second layer to treat a Bell's palsy.  Encourage followup with her primary care physician.  This week       Arman Filter, NP 10/28/12 0202

## 2012-11-18 NOTE — ED Provider Notes (Signed)
Medical screening examination/treatment/procedure(s) were performed by non-physician practitioner and as supervising physician I was immediately available for consultation/collaboration.    Vida Roller, MD 11/18/12 [phone]

## 2012-11-26 ENCOUNTER — Ambulatory Visit: Payer: Medicaid Other | Attending: Internal Medicine | Admitting: Physical Therapy

## 2012-11-26 DIAGNOSIS — R29810 Facial weakness: Secondary | ICD-10-CM | POA: Insufficient documentation

## 2012-11-26 DIAGNOSIS — IMO0001 Reserved for inherently not codable concepts without codable children: Secondary | ICD-10-CM | POA: Insufficient documentation

## 2012-12-03 ENCOUNTER — Ambulatory Visit: Payer: Medicaid Other | Admitting: Physical Therapy

## 2012-12-15 ENCOUNTER — Ambulatory Visit: Payer: Medicaid Other | Admitting: Physical Therapy

## 2013-04-14 ENCOUNTER — Other Ambulatory Visit: Payer: Self-pay

## 2013-04-14 DIAGNOSIS — Z1231 Encounter for screening mammogram for malignant neoplasm of breast: Secondary | ICD-10-CM

## 2013-05-25 ENCOUNTER — Ambulatory Visit
Admission: RE | Admit: 2013-05-25 | Discharge: 2013-05-25 | Disposition: A | Discharge status: Home / Self Care | Payer: Medicaid Other | Source: Ambulatory Visit

## 2013-05-25 DIAGNOSIS — Z1231 Encounter for screening mammogram for malignant neoplasm of breast: Secondary | ICD-10-CM

## 2013-06-16 ENCOUNTER — Other Ambulatory Visit: Payer: Self-pay | Admitting: Internal Medicine

## 2013-06-16 DIAGNOSIS — M79605 Pain in left leg: Secondary | ICD-10-CM

## 2013-06-17 ENCOUNTER — Other Ambulatory Visit: Payer: Self-pay

## 2013-06-19 ENCOUNTER — Ambulatory Visit
Admission: RE | Admit: 2013-06-19 | Discharge: 2013-06-19 | Disposition: A | Discharge status: Home / Self Care | Payer: Medicaid Other | Source: Ambulatory Visit | Attending: Internal Medicine | Admitting: Internal Medicine

## 2013-06-19 DIAGNOSIS — M79605 Pain in left leg: Secondary | ICD-10-CM

## 2013-11-14 ENCOUNTER — Encounter (HOSPITAL_COMMUNITY): Payer: Self-pay | Admitting: Emergency Medicine

## 2013-11-14 ENCOUNTER — Emergency Department (INDEPENDENT_AMBULATORY_CARE_PROVIDER_SITE_OTHER)
Admission: EM | Admit: 2013-11-14 | Discharge: 2013-11-14 | Disposition: A | Discharge status: Home / Self Care | Payer: Medicaid Other | Source: Home / Self Care | Attending: Family Medicine | Admitting: Family Medicine

## 2013-11-14 DIAGNOSIS — E669 Obesity, unspecified: Secondary | ICD-10-CM

## 2013-11-14 DIAGNOSIS — N912 Amenorrhea, unspecified: Secondary | ICD-10-CM

## 2013-11-14 LAB — POCT URINALYSIS DIP (DEVICE)
Nitrite: NEGATIVE
Protein, ur: NEGATIVE mg/dL
pH: 6.5 (ref 5.0–8.0)

## 2013-11-14 NOTE — ED Provider Notes (Signed)
Latoya Roth is a 42 y.o. female who presents to Urgent Care today for amenorrhea since September. She has not had a period since September. She has had unprotected sex and could be pregnant. She has had 2 episodes of vomiting. She feels well currently no fevers or chills dysuria nausea vomiting or diarrhea. She has not checked for pregnancy since October.  She is G2P2001. One of her children died 77 years old due to cancer. She has a living 76 year old son.  She is asymptomatic and feels well currently.   Past Medical History  Diagnosis Date  . Diabetes mellitus   . Arthritis   . Asthma   . Hypertension   . Depression   . Anxiety    History  Substance Use Topics  . Smoking status: Never Smoker   . Smokeless tobacco: Not on file  . Alcohol Use: Yes     Comment: 2/day   ROS as above Medications reviewed. No current facility-administered medications for this encounter.   Current Outpatient Prescriptions  Medication Sig Dispense Refill  . acyclovir (ZOVIRAX) 400 MG tablet Take 1 tablet (400 mg total) by mouth 4 (four) times daily.  50 tablet  0  . albuterol (PROVENTIL) (2.5 MG/3ML) 0.083% nebulizer solution Take 2.5 mg by nebulization every 6 (six) hours as needed. For shortness of breath      . albuterol-ipratropium (COMBIVENT) 18-103 MCG/ACT inhaler Inhale 2 puffs into the lungs 4 (four) times daily.      . beclomethasone (QVAR) 80 MCG/ACT inhaler Inhale 1 puff into the lungs 2 (two) times daily.      . cetirizine (ZYRTEC) 10 MG tablet Take 10 mg by mouth daily.      . metFORMIN (GLUCOPHAGE) 500 MG tablet Take 500 mg by mouth 2 (two) times daily with a meal.      . naproxen (NAPROSYN) 500 MG tablet Take 500 mg by mouth 2 (two) times daily with a meal.      . predniSONE (DELTASONE) 20 MG tablet 3 Tabs PO Days 1-3, then 2 tabs PO Days 4-6, then 1 tab PO Day 7-9, then Half Tab PO Day 10-12  20 tablet  0  . QUEtiapine (SEROQUEL) 100 MG tablet Take 100 mg by mouth at bedtime as  needed. When needed for sleep      . traZODone (DESYREL) 150 MG tablet Take 150 mg by mouth at bedtime as needed. For sleep        Exam:  BP 151/62  Pulse 74  Temp(Src) 98 F (36.7 C) (Oral)  Resp 16  SpO2 100%  LMP 08/24/2013 Gen: Well NAD, obese HEENT: EOMI,  MMM Lungs: Normal work of breathing. CTABL Heart: RRR no MRG Abd: NABS, Soft. NT, ND Exts: Non edematous BL  LE, warm and well perfused.   Results for orders placed during the hospital encounter of 11/14/13 (from the past 24 hour(s))  POCT URINALYSIS DIP (DEVICE)     Status: Abnormal   Collection Time    11/14/13  2:49 PM      Result Value Range   Glucose, UA NEGATIVE  NEGATIVE mg/dL   Bilirubin Urine NEGATIVE  NEGATIVE   Ketones, ur NEGATIVE  NEGATIVE mg/dL   Specific Gravity, Urine 1.020  1.005 - 1.030   Hgb urine dipstick SMALL (*) NEGATIVE   pH 6.5  5.0 - 8.0   Protein, ur NEGATIVE  NEGATIVE mg/dL   Urobilinogen, UA 0.2  0.0 - 1.0 mg/dL   Nitrite NEGATIVE  NEGATIVE  Leukocytes, UA TRACE (*) NEGATIVE  POCT PREGNANCY, URINE     Status: None   Collection Time    11/14/13  2:59 PM      Result Value Range   Preg Test, Ur NEGATIVE  NEGATIVE   No results found.  Assessment and Plan: 42 y.o. female with amenorrhea. Likely related to irregular menstrual bleeding. She has diabetes and I suspect she has anovulatory cycles. Plan to followup with primary care provider for further evaluation and management.  Discussed warning signs or symptoms. Please see discharge instructions. Patient expresses understanding.     Rodolph Bong, MD 11/14/13 [phone]

## 2013-11-14 NOTE — ED Notes (Signed)
Pt reports she has not had a period x 3 months. Pt also reports vomitting on numerous occasions. Home prenancy test in October was negative. Pt is alert and oriented.

## 2013-11-27 ENCOUNTER — Inpatient Hospital Stay (HOSPITAL_COMMUNITY)
Admission: AD | Admit: 2013-11-27 | Discharge: 2013-11-27 | Disposition: A | Discharge status: Home / Self Care | Payer: Medicaid Other | Source: Ambulatory Visit | Attending: Family Medicine | Admitting: Family Medicine

## 2013-11-27 ENCOUNTER — Encounter (HOSPITAL_COMMUNITY): Payer: Self-pay | Admitting: *Deleted

## 2013-11-27 DIAGNOSIS — A499 Bacterial infection, unspecified: Secondary | ICD-10-CM | POA: Insufficient documentation

## 2013-11-27 DIAGNOSIS — N912 Amenorrhea, unspecified: Secondary | ICD-10-CM | POA: Insufficient documentation

## 2013-11-27 DIAGNOSIS — N760 Acute vaginitis: Secondary | ICD-10-CM

## 2013-11-27 DIAGNOSIS — B9689 Other specified bacterial agents as the cause of diseases classified elsewhere: Secondary | ICD-10-CM

## 2013-11-27 DIAGNOSIS — M549 Dorsalgia, unspecified: Secondary | ICD-10-CM | POA: Insufficient documentation

## 2013-11-27 LAB — URINALYSIS, ROUTINE W REFLEX MICROSCOPIC
Bilirubin Urine: NEGATIVE
Glucose, UA: NEGATIVE mg/dL
Protein, ur: NEGATIVE mg/dL
Urobilinogen, UA: 0.2 mg/dL (ref 0.0–1.0)

## 2013-11-27 LAB — URINE MICROSCOPIC-ADD ON

## 2013-11-27 LAB — WET PREP, GENITAL
Trich, Wet Prep: NONE SEEN
Yeast Wet Prep HPF POC: NONE SEEN

## 2013-11-27 LAB — HCG, SERUM, QUALITATIVE: Preg, Serum: NEGATIVE

## 2013-11-27 MED ORDER — METRONIDAZOLE 500 MG PO TABS: 500.0000 mg | ORAL_TABLET | Freq: Two times a day (BID) | ORAL | Status: DC

## 2013-11-27 NOTE — MAU Provider Note (Signed)
Chart reviewed and agree with management and plan.  

## 2013-11-27 NOTE — MAU Note (Signed)
Patient states she has had a vaginal discharge since last night. Has had back pain for about one week. States her abdomen has been getting bigger for the past 4 months and has not had a period for 4 months.

## 2013-11-27 NOTE — MAU Note (Addendum)
No period in 4 months.  Been having back pain, started last night- no pain currently.  Vag d/c past 3 days.  Stomach is getting bigger, has been to primary and to urgent care- told  Not preg.  Had pap smear 6 months ago.

## 2013-11-27 NOTE — MAU Provider Note (Signed)
History     CSN: [id number]  Arrival date and time: 11/27/13 4098   First Provider Initiated Contact with Patient 11/27/13 1020      Chief Complaint  Patient presents with  . Vaginal Discharge  . Back Pain   HPI [name] is a 42 y.o. [id number] who presents to MAU today with complaint of discharge x 3 days, back pain and amenorrhea x 4 months. The patient states that she saw her PCP and had a negative UPT recently. She feels like her abdomen is getting bigger and she feels movement in her abdomen. She also states that her boyfriend was able to hear a heart beat when he put his head on her abdomen. She patient denies abdominal pain, N/V/D or constipation, UTI symptoms, vaginal bleeding or fever. She is having mild lower back pain and a clear, watery discharge x 3 days.    OB History   Grav Para Term Preterm Abortions TAB SAB Ect Mult Living   2 2 2  0 0 0 0 0 0 2      Past Medical History  Diagnosis Date  . Diabetes mellitus   . Arthritis   . Asthma   . Hypertension   . Depression   . Anxiety     Past Surgical History  Procedure Laterality Date  . Cholecystectomy      Family History  Problem Relation Age of Onset  . Hypertension Mother   . Cancer Brother 8    brain tumor    History  Substance Use Topics  . Smoking status: Never Smoker   . Smokeless tobacco: Never Used  . Alcohol Use: 0.6 oz/week    1 Cans of beer per week     Comment: 2/day    Allergies:  Allergies  Allergen Reactions  . Penicillins Anaphylaxis  . Shellfish Allergy Anaphylaxis, Hives and Swelling    No prescriptions prior to admission    Review of Systems  Constitutional: Negative for fever and malaise/fatigue.  Gastrointestinal: Negative for nausea, vomiting, abdominal pain, diarrhea and constipation.  Genitourinary: Negative for dysuria, urgency and frequency.       + vaginal discharge Neg - vaginal bleeding   Physical Exam   Blood pressure 122/73, pulse 80,  temperature 98.3 F (36.8 C), temperature source Oral, resp. rate 18, height 5\' 6"  (1.676 m), weight 262 lb 9.6 oz (119.115 kg), last menstrual period 08/24/2013, SpO2 99.00%.  Physical Exam  Constitutional: She is oriented to person, place, and time. She appears well-developed and well-nourished. No distress.  HENT:  Head: Normocephalic and atraumatic.  Cardiovascular: Normal rate, regular rhythm and normal heart sounds.   Respiratory: Effort normal and breath sounds normal. No respiratory distress.  GI: Soft. Bowel sounds are normal. She exhibits no distension and no mass. There is no tenderness. There is no rebound and no guarding.  Genitourinary: Uterus is not enlarged and not tender. Cervix exhibits no motion tenderness, no discharge and no friability. Right adnexum displays no mass and no tenderness. Left adnexum displays no mass and no tenderness. No bleeding around the vagina. Vaginal discharge (scant thin, white discharge noted) found.  Neurological: She is alert and oriented to person, place, and time.  Skin: Skin is warm and dry. No erythema.  Psychiatric: She has a normal mood and affect.   Results for orders placed during the hospital encounter of 11/27/13 (from the past 24 hour(s))  URINALYSIS, ROUTINE W REFLEX MICROSCOPIC     Status: Abnormal   Collection  Time    11/27/13 10:05 AM      Result Value Range   Color, Urine YELLOW  YELLOW   APPearance HAZY (*) CLEAR   Specific Gravity, Urine 1.015  1.005 - 1.030   pH 7.5  5.0 - 8.0   Glucose, UA NEGATIVE  NEGATIVE mg/dL   Hgb urine dipstick TRACE (*) NEGATIVE   Bilirubin Urine NEGATIVE  NEGATIVE   Ketones, ur NEGATIVE  NEGATIVE mg/dL   Protein, ur NEGATIVE  NEGATIVE mg/dL   Urobilinogen, UA 0.2  0.0 - 1.0 mg/dL   Nitrite NEGATIVE  NEGATIVE   Leukocytes, UA NEGATIVE  NEGATIVE  URINE MICROSCOPIC-ADD ON     Status: Abnormal   Collection Time    11/27/13 10:05 AM      Result Value Range   Squamous Epithelial / LPF MANY (*)  RARE   RBC / HPF 0-2  <3 RBC/hpf   Bacteria, UA RARE  RARE  POCT PREGNANCY, URINE     Status: None   Collection Time    11/27/13 10:28 AM      Result Value Range   Preg Test, Ur NEGATIVE  NEGATIVE  WET PREP, GENITAL     Status: Abnormal   Collection Time    11/27/13 11:00 AM      Result Value Range   Yeast Wet Prep HPF POC NONE SEEN  NONE SEEN   Trich, Wet Prep NONE SEEN  NONE SEEN   Clue Cells Wet Prep HPF POC FEW (*) NONE SEEN   WBC, Wet Prep HPF POC FEW (*) NONE SEEN  HCG, SERUM, QUALITATIVE     Status: None   Collection Time    11/27/13 11:06 AM      Result Value Range   Preg, Serum NEGATIVE  NEGATIVE    MAU Course  Procedures None  MDM Wet prep, GC/Chlamydia, UA, UPT today  Assessment and Plan  A: Secondary amenorrhea Bacterial vaginosis  P: Discharge home Rx for Flagyl sent to patient's pharmacy Patient referred to [facility] clinic for follow-up for Amenorrhea. They will contact the patient with an appointment Patient advised to follow-up with PCP as needed for back pain Patient may return to MAU as needed or if her condition were to change or worsen  Freddi Starr, PA-C  11/27/2013, 2:54 PM

## 2013-11-28 LAB — GC/CHLAMYDIA PROBE AMP
CT Probe RNA: NEGATIVE
GC Probe RNA: NEGATIVE

## 2013-12-07 ENCOUNTER — Encounter: Payer: Medicaid Other | Admitting: Obstetrics & Gynecology

## 2014-01-15 ENCOUNTER — Ambulatory Visit (INDEPENDENT_AMBULATORY_CARE_PROVIDER_SITE_OTHER): Payer: Medicaid Other | Admitting: Advanced Practice Midwife

## 2014-01-15 ENCOUNTER — Encounter: Payer: Self-pay | Admitting: Advanced Practice Midwife

## 2014-01-15 ENCOUNTER — Encounter (INDEPENDENT_AMBULATORY_CARE_PROVIDER_SITE_OTHER): Payer: Self-pay

## 2014-01-15 VITALS — BP 144/79 | HR 77 | Temp 98.0°F | Ht 67.0 in | Wt 265.0 lb

## 2014-01-15 DIAGNOSIS — R143 Flatulence: Secondary | ICD-10-CM

## 2014-01-15 DIAGNOSIS — R109 Unspecified abdominal pain: Secondary | ICD-10-CM

## 2014-01-15 DIAGNOSIS — R350 Frequency of micturition: Secondary | ICD-10-CM

## 2014-01-15 DIAGNOSIS — R141 Gas pain: Secondary | ICD-10-CM

## 2014-01-15 DIAGNOSIS — R142 Eructation: Secondary | ICD-10-CM

## 2014-01-15 DIAGNOSIS — R140 Abdominal distension (gaseous): Secondary | ICD-10-CM

## 2014-01-15 DIAGNOSIS — N912 Amenorrhea, unspecified: Secondary | ICD-10-CM

## 2014-01-15 DIAGNOSIS — I10 Essential (primary) hypertension: Secondary | ICD-10-CM | POA: Insufficient documentation

## 2014-01-15 DIAGNOSIS — Z6841 Body Mass Index (BMI) 40.0 and over, adult: Secondary | ICD-10-CM

## 2014-01-15 LAB — POCT URINE PREGNANCY: Preg Test, Ur: NEGATIVE

## 2014-01-15 LAB — POCT URINALYSIS DIPSTICK
Bilirubin, UA: NEGATIVE
Glucose, UA: NEGATIVE
Ketones, UA: NEGATIVE
Leukocytes, UA: NEGATIVE
Nitrite, UA: NEGATIVE
PROTEIN UA: NEGATIVE
RBC UA: NEGATIVE
SPEC GRAV UA: 1.015
UROBILINOGEN UA: NEGATIVE
pH, UA: 7

## 2014-01-15 NOTE — Addendum Note (Signed)
Addended by: Edward Qualia V on: 01/15/2014 12:00 PM   Modules accepted: Orders

## 2014-01-15 NOTE — Progress Notes (Signed)
  Subjective:     Latoya Roth is a 43 y.o. female who presents for evaluation of amenorrhea. Patient reports bloating, distention and amenorrhea since 08/24/2013. She states she wants an [place] and ask what do I have to do to get one. Onset was sudden occurring daily . ago. Symptoms have been unchanged since. Aggravating factors: none. Alleviating factors: none. Associated symptoms: none. The patient denies other symptoms including pain, dysmenorrhea, denies hot flashes, vaginal dryness. Risk factors for pelvic/abdominal pain include none. Patient denies recent contraceptives, reports used pills over 2 years ago. Would like to get pregnant, last had unprotected IC this morning. Is in a monogamous relationship the past few years, desires GC/CT screening today. Denies other screening. Reports had a recent pap w/ her PCP that was normal, denies GYN history, surgical procedures or cervical procedures. Reports has had 2 NVD, last in 1997.   Patient reports she is established at the breast center and it is time for a repeat mammogram.   Patient denies GYN cancer, or BRCA in her family.  Patient reports regular BM, urination. Denies other medical concerns.  Menstrual History: OB History   Grav Para Term Preterm Abortions TAB SAB Ect Mult Living   '2 2 2 '$ 0 0 0 0 0 0 2      Menarche age: NA  Patient's last menstrual period was 08/24/2013.    The following portions of the patient's history were reviewed and updated as appropriate: allergies, current medications, past family history, past medical history, past social history, past surgical history and problem list.   Review of Systems A comprehensive review of systems was negative.    Objective:    BP 144/79  Pulse 77  Temp(Src) 98 F (36.7 C)  Ht $R'5\' 7"'zz$  (1.702 m)  Wt 265 lb (120.203 kg)  BMI 41.50 kg/m2  LMP 08/24/2013 General:   alert and appears stated age  Lungs:   clear to auscultation bilaterally  Heart:   regular rate and rhythm, S1,  S2 normal, no murmur, click, rub or gallop  Abdomen:  soft, non-tender; bowel sounds normal; no masses,  no organomegaly  CVA:   absent  Pelvis:  Vulva and vagina appear normal. Bimanual exam reveals normal uterus and adnexa. Exam limited by body habitus  Extremities:   extremities normal, atraumatic, no cyanosis or edema  Neurologic:   negative  Psychiatric:   normal mood, behavior, speech, dress, and thought processes  Physical Examination: Breasts - breasts appear normal, no suspicious masses, no skin or nipple changes or axillary nodes   Lab Review Labs: Urine pregnancy test and Urinalysis - with micro   Imaging Ultrasound - Pelvic Vaginal    Assessment:    Amenorrhea x4 months  Abdominal bloating and distention Pregnancy test negative Cannot rule out menopause Hypertension Advanced maternal age Negative breast exam today    Plan:    The diagnosis was discussed with the patient and evaluation and treatment plans outlined.  Encouraged patient to use condoms to prevent pregnancy Pelvic US ordered and pending GC/CT pending Encouraged patient to make routine mammogram.   45 min spent with patient greater than 80% spent in counseling and coordination of care.    Kyndahl Jablon Roni Bread CNM

## 2014-01-15 NOTE — Progress Notes (Signed)
Pt in office today for problems visit, reports no menstrual cycle since sept 2014, has had pain to lower abd and lower back since period stopped, states pressure and irritation with urination.

## 2014-01-17 LAB — GC/CHLAMYDIA PROBE AMP
CT PROBE, AMP APTIMA: NEGATIVE
GC Probe RNA: NEGATIVE

## 2014-01-18 ENCOUNTER — Ambulatory Visit (HOSPITAL_COMMUNITY)
Admission: RE | Admit: 2014-01-18 | Discharge: 2014-01-18 | Disposition: A | Discharge status: Home / Self Care | Payer: Medicaid Other | Source: Ambulatory Visit | Attending: Advanced Practice Midwife | Admitting: Advanced Practice Midwife

## 2014-01-18 DIAGNOSIS — R140 Abdominal distension (gaseous): Secondary | ICD-10-CM

## 2014-01-18 DIAGNOSIS — N912 Amenorrhea, unspecified: Secondary | ICD-10-CM | POA: Insufficient documentation

## 2014-01-18 DIAGNOSIS — R143 Flatulence: Secondary | ICD-10-CM

## 2014-01-18 DIAGNOSIS — N949 Unspecified condition associated with female genital organs and menstrual cycle: Secondary | ICD-10-CM | POA: Insufficient documentation

## 2014-01-18 DIAGNOSIS — R142 Eructation: Secondary | ICD-10-CM | POA: Insufficient documentation

## 2014-01-18 DIAGNOSIS — D251 Intramural leiomyoma of uterus: Secondary | ICD-10-CM | POA: Insufficient documentation

## 2014-01-18 DIAGNOSIS — R141 Gas pain: Secondary | ICD-10-CM | POA: Insufficient documentation

## 2014-02-05 ENCOUNTER — Ambulatory Visit (INDEPENDENT_AMBULATORY_CARE_PROVIDER_SITE_OTHER): Payer: Medicaid Other | Admitting: Obstetrics & Gynecology

## 2014-02-05 VITALS — BP 155/88 | HR 89 | Temp 98.1°F | Wt 267.0 lb

## 2014-02-05 DIAGNOSIS — N912 Amenorrhea, unspecified: Secondary | ICD-10-CM

## 2014-02-05 LAB — POCT URINE PREGNANCY: Preg Test, Ur: NEGATIVE

## 2014-02-05 MED ORDER — MEDROXYPROGESTERONE ACETATE 10 MG PO TABS: 10.0000 mg | ORAL_TABLET | Freq: Every day | ORAL | Status: DC

## 2014-02-05 NOTE — Patient Instructions (Signed)
Preparing for Pregnancy Preparing for pregnancy (preconceptual care) by getting counseling and information from your caregiver before getting pregnant is a good idea. It will help you and your baby have a better chance to have a healthy, safe pregnancy and delivery of your baby. Make an appointment with your caregiver to talk about your health, medical, and family history and how to prepare yourself before getting pregnant. Your caregiver will do a complete physical exam and a Pap test. They will want to know:  About you, your spouse or partner, and your family's medical and genetic history.  If you are eating a balanced diet and drinking enough fluids.  What vitamins and mineral supplements you are taking. This includes taking folic acid before getting pregnant to help prevent birth defects.  What medications you are taking including prescription, over-the-counter and herbal medications.  If there is any substance abuse like alcohol, smoking, and illegal drugs.  If there is any mental or physical domestic violence.  If there is any risk of sexually transmitted disease between you and your partner.  What immunizations and vaccinations you have had and what you may need before getting pregnant.  If you should get tested for HIV infection.  If there is any exposure to chemical or toxic substances at home or work.  If there are medical problems you have that need to be treated and kept under control before getting pregnant such as diabetes, high blood pressure or others.  If there were any past surgeries, pregnancies and problems with them.  What your current weight is and to set a goal as to how much weight you should gain while pregnant. Also, they will check if you should lose or gain weight before getting pregnant.  What is your exercise routine and what it is safe when you are pregnant.  If there are any physical disabilities that need to be addressed.  About spacing your  pregnancies when there are other children.  If there is a financial problem that may affect you having a child. After talking about the above points with your caregiver, your caregiver will give you advice on how to help treat and work with you on solving any issues, if necessary, before getting pregnant. The goal is to have a healthy and safe pregnancy for you and your baby. You should keep an accurate record of your menstrual periods because it will help in determining your due date. Immunizations that you should have before getting pregnant:   Regular measles, German measles (rubella) and mumps.  Tetanus and diphtheria.  Chickenpox, if not immune.  Herpes zoster (Varicella) if not immune.  Human papilloma virus vaccine (HPV) between the age of 9 and 26 years old.  Hepatitis A vaccine.  Hepatitis B vaccine.  Influenza vaccine.  Pneumococcal vaccine (pneumonia). You should avoid getting pregnant for one month after getting vaccinated with a live virus vaccine such as German measles (rubella) which is in the MMR (Measles, Mumps and Rubella) vaccine. Other immunizations may be necessary depending on where you live, such as malaria. Ask your caregiver if any other immunizations are needed for you. HOME CARE INSTRUCTIONS   Follow the advice of your caregiver.  Before getting pregnant:  Begin taking vitamins, supplements, and 0.4 milligrams folic acid daily.  Get your immunizations up-to-date.  Get help from a nutrition counselor if you do not understand what a balanced diet is, need help with a special medical diet or if you need help to lose or gain weight.    Begin exercising.  Stop smoking, taking illegal drugs, and drinking alcoholic beverages.  Get counseling if there is and type of domestic violence.  Get checked for sexually transmitted diseases including HIV.  Get any medical problems under control (diabetes, high blood pressure, convulsions, asthma or  others).  Resolve any financial concerns or create a plan to do so.  Be sure you and your spouse or partner are ready to have a baby.  Keep an accurate record of your menstrual periods. Document Released: 11/22/2008 Document Revised: 09/30/2013 Document Reviewed: 11/22/2008 [facility] Patient Information 2014 [place].

## 2014-02-05 NOTE — Progress Notes (Signed)
Pt is in office today to discuss u/s results, complete pelvis on 01/15/14.  A/P Irregular menses ?Fertility  Cycle w/Provera for now Orders Placed This Encounter  Procedures  . Estradiol  . TSH  . Prolactin  . Testosterone, free, total  . 17-Hydroxyprogesterone  . [place]  . Anti mullerian hormone  . DHEA-sulfate  . POCT urine pregnancy  Return in a few weeks

## 2014-02-06 LAB — TSH: TSH: 1.439 u[IU]/mL (ref 0.350–4.500)

## 2014-02-06 LAB — ESTRADIOL: Estradiol: 238.1 pg/mL

## 2014-02-06 LAB — PROLACTIN: Prolactin: 11.9 ng/mL

## 2014-02-06 LAB — FOLLICLE STIMULATING HORMONE: FSH: 11.8 m[IU]/mL

## 2014-02-07 ENCOUNTER — Encounter: Payer: Self-pay | Admitting: Obstetrics & Gynecology

## 2014-02-08 LAB — TESTOSTERONE, FREE, TOTAL, SHBG
SEX HORMONE BINDING: 44 nmol/L (ref 18–114)
TESTOSTERONE FREE: 4.3 pg/mL (ref 0.6–6.8)
Testosterone-% Free: 1.5 % (ref 0.4–2.4)
Testosterone: 29 ng/dL (ref 10–70)

## 2014-02-08 LAB — DHEA-SULFATE: DHEA SO4: 88 ug/dL (ref 35–430)

## 2014-02-08 LAB — ANTI MULLERIAN HORMONE: AMH AssessR: 0.56 ng/mL

## 2014-02-09 LAB — 17-HYDROXYPROGESTERONE: 17-OH-PROGESTERONE, LC/MS/MS: 26 ng/dL

## 2014-02-26 ENCOUNTER — Ambulatory Visit (INDEPENDENT_AMBULATORY_CARE_PROVIDER_SITE_OTHER): Payer: Medicaid Other | Admitting: Advanced Practice Midwife

## 2014-02-26 ENCOUNTER — Encounter: Payer: Self-pay | Admitting: Advanced Practice Midwife

## 2014-02-26 VITALS — BP 134/85 | HR 80 | Temp 97.9°F | Ht 67.0 in | Wt 265.0 lb

## 2014-02-26 DIAGNOSIS — N926 Irregular menstruation, unspecified: Secondary | ICD-10-CM

## 2014-02-26 DIAGNOSIS — N912 Amenorrhea, unspecified: Secondary | ICD-10-CM

## 2014-02-26 DIAGNOSIS — N939 Abnormal uterine and vaginal bleeding, unspecified: Secondary | ICD-10-CM

## 2014-02-26 NOTE — Progress Notes (Signed)
Subjective:     Patient ID: Latoya Roth, female   DOB: 03-Jun-1971, 43 y.o.   MRN: [id number]  Patient had amenorrhea. S/P Progesterone Challenge. Had bleeding x7 days and has stopped. Denies other concerns. Would like to not have to take birth control. Patient is not interested in an IUD. Would like to get pregnant but aware of the risk associated w/ pregnancy. Patient does not smoke.  Vaginal Bleeding The patient is experiencing no pain. She is not pregnant. Pertinent negatives include no abdominal pain. She is sexually active. No, her partner does not have an STD. She uses nothing for contraception. Her menstrual history has been irregular.     Review of Systems  Constitutional: Negative.   HENT: Negative.   Eyes: Negative.   Respiratory: Negative.   Cardiovascular: Negative.   Gastrointestinal: Negative.  Negative for abdominal pain.  Endocrine: Negative.   Genitourinary: Positive for vaginal bleeding.  Musculoskeletal: Negative.   Skin: Negative.   Allergic/Immunologic: Negative.   Neurological: Negative.   Hematological: Negative.   Psychiatric/Behavioral: Negative.        Objective:   Physical Exam  Constitutional: She is oriented to person, place, and time. She appears well-developed and well-nourished.  HENT:  Head: Normocephalic.  Genitourinary: Vagina normal and uterus normal.  Musculoskeletal: Normal range of motion.  Neurological: She is alert and oriented to person, place, and time. She has normal reflexes.  Psychiatric: She has a normal mood and affect. Her behavior is normal. Judgment and thought content normal.   Filed Vitals:   02/26/14 1027  BP: 134/85  Pulse: 80  Temp: 97.9 F (36.6 C)       Assessment:     S/P Progesterone Challenge, +Vaginal Bleeding Patient declines OCP (not ideal b/c of HTN and age) or Mirena at this time Lab panel done and WNL, Pelvic US WNL Patient Active Problem List   Diagnosis Date Noted  . BMI 40.0-44.9, adult  01/15/2014  . High blood pressure 01/15/2014  . Amenorrhea 01/15/2014        Plan:     Patient to see PCP for management of HTN Encouraged Mirena for prevention, patient declined today. Patient to f/u in 3 months to evaluate bleeding.  15 min spent with patient greater than 80% spent in counseling and coordination of care.   Hudson Majkowski Roni Bread CNM

## 2014-02-26 NOTE — Progress Notes (Signed)
Patient is in the office today for a follow up visit for lab results. Patient denies any concerns.

## 2014-03-01 ENCOUNTER — Other Ambulatory Visit: Payer: Self-pay | Admitting: Obstetrics & Gynecology

## 2014-03-02 NOTE — Telephone Encounter (Signed)
Please review

## 2014-03-13 ENCOUNTER — Encounter (HOSPITAL_COMMUNITY): Payer: Self-pay | Admitting: Emergency Medicine

## 2014-03-13 ENCOUNTER — Emergency Department (HOSPITAL_COMMUNITY)
Admission: EM | Admit: 2014-03-13 | Discharge: 2014-03-13 | Disposition: A | Discharge status: Home / Self Care | Payer: Medicaid Other | Attending: Emergency Medicine | Admitting: Emergency Medicine

## 2014-03-13 DIAGNOSIS — Z791 Long term (current) use of non-steroidal anti-inflammatories (NSAID): Secondary | ICD-10-CM | POA: Insufficient documentation

## 2014-03-13 DIAGNOSIS — R358 Other polyuria: Secondary | ICD-10-CM

## 2014-03-13 DIAGNOSIS — R3589 Other polyuria: Secondary | ICD-10-CM

## 2014-03-13 DIAGNOSIS — R631 Polydipsia: Secondary | ICD-10-CM

## 2014-03-13 DIAGNOSIS — J069 Acute upper respiratory infection, unspecified: Secondary | ICD-10-CM | POA: Insufficient documentation

## 2014-03-13 DIAGNOSIS — E119 Type 2 diabetes mellitus without complications: Secondary | ICD-10-CM | POA: Insufficient documentation

## 2014-03-13 DIAGNOSIS — Z8659 Personal history of other mental and behavioral disorders: Secondary | ICD-10-CM | POA: Insufficient documentation

## 2014-03-13 DIAGNOSIS — Z79899 Other long term (current) drug therapy: Secondary | ICD-10-CM | POA: Insufficient documentation

## 2014-03-13 DIAGNOSIS — Z880 Allergy status to penicillin: Secondary | ICD-10-CM | POA: Insufficient documentation

## 2014-03-13 DIAGNOSIS — M545 Low back pain, unspecified: Secondary | ICD-10-CM | POA: Insufficient documentation

## 2014-03-13 DIAGNOSIS — I10 Essential (primary) hypertension: Secondary | ICD-10-CM | POA: Insufficient documentation

## 2014-03-13 DIAGNOSIS — R739 Hyperglycemia, unspecified: Secondary | ICD-10-CM

## 2014-03-13 DIAGNOSIS — R300 Dysuria: Secondary | ICD-10-CM | POA: Insufficient documentation

## 2014-03-13 DIAGNOSIS — M129 Arthropathy, unspecified: Secondary | ICD-10-CM | POA: Insufficient documentation

## 2014-03-13 DIAGNOSIS — J45909 Unspecified asthma, uncomplicated: Secondary | ICD-10-CM | POA: Insufficient documentation

## 2014-03-13 LAB — URINALYSIS, ROUTINE W REFLEX MICROSCOPIC
Bilirubin Urine: NEGATIVE
Glucose, UA: >1000 mg/dL — AB
Ketones, ur: NEGATIVE mg/dL
Leukocytes, UA: NEGATIVE
Nitrite: NEGATIVE
Protein, ur: NEGATIVE mg/dL
Specific Gravity, Urine: 1.02 (ref 1.005–1.030)
Urobilinogen, UA: 1 mg/dL (ref 0.0–1.0)
pH: 7 (ref 5.0–8.0)

## 2014-03-13 LAB — URINE MICROSCOPIC-ADD ON

## 2014-03-13 LAB — CBG MONITORING, ED: Glucose-Capillary: 333 mg/dL — ABNORMAL HIGH (ref 70–99)

## 2014-03-13 MED ORDER — SALINE SPRAY 0.65 % NA SOLN: 1.0000 | NASAL | Status: DC | PRN

## 2014-03-13 MED ORDER — GUAIFENESIN 100 MG/5ML PO LIQD: 100.0000 mg | ORAL | Status: DC | PRN

## 2014-03-13 NOTE — ED Notes (Signed)
Pt states she has been having productive cough, subjective fever, and body aches x 1 wk.

## 2014-03-13 NOTE — ED Provider Notes (Signed)
CSN: [id number]     Arrival date & time 03/13/14  5009 History   First MD Initiated Contact with Patient 03/13/14 [phone]     Chief Complaint  Patient presents with  . Cough  . Headache  . Generalized Body Aches     (Consider location/radiation/quality/duration/timing/severity/associated sxs/prior Treatment) HPI Pt is a 43yo female with hx of DM, asthma, arthritis, HTN, anxiety and depression c/o 1 week hx of cough, congestion, headache, and generalized body aches associated with subjective fever. Pt states she has been trying OTC cough drops w/o much relief. Pt also reports using home albuterol nebulizer more frequently w/o much relief.  Denies n/v/d. Does report some dysuria, but denies hematuria, urgency or frequency. Denies sick contacts or recent travel. Pt seen by Dr. Jeanie Cooks, PCP, was seen 1 month ago.  Did not f/u yesterday for current symptoms due to lack of money for co-pay.  Past Medical History  Diagnosis Date  . Diabetes mellitus   . Arthritis   . Asthma   . Hypertension   . Depression   . Anxiety    Past Surgical History  Procedure Laterality Date  . Cholecystectomy     Family History  Problem Relation Age of Onset  . Hypertension Mother   . Cancer Son    History  Substance Use Topics  . Smoking status: Never Smoker   . Smokeless tobacco: Never Used  . Alcohol Use: 0.6 oz/week    1 Cans of beer per week     Comment: 2/day   OB History   Grav Para Term Preterm Abortions TAB SAB Ect Mult Living   2 2 2  0 0 0 0 0 0 2     Review of Systems  Constitutional: Positive for fever ( subjective). Negative for chills.  HENT: Positive for congestion and sore throat. Negative for trouble swallowing and voice change.   Respiratory: Positive for cough. Negative for shortness of breath, wheezing and stridor.   Cardiovascular: Negative for chest pain.  Gastrointestinal: Negative for nausea, vomiting, abdominal pain and diarrhea.  Endocrine: Positive for polydipsia and  polyuria. Negative for polyphagia.  Genitourinary: Positive for dysuria and frequency. Negative for urgency, hematuria, decreased urine volume, vaginal bleeding, vaginal discharge, vaginal pain and pelvic pain.  Musculoskeletal: Positive for back pain ( lower) and myalgias.  All other systems reviewed and are negative.      Allergies  Penicillins and Shellfish allergy  Home Medications   Current Outpatient Rx  Name  Route  Sig  Dispense  Refill  . albuterol (PROVENTIL) (2.5 MG/3ML) 0.083% nebulizer solution   Nebulization   Take 2.5 mg by nebulization every 6 (six) hours as needed. For shortness of breath         . albuterol-ipratropium (COMBIVENT) 18-103 MCG/ACT inhaler   Inhalation   Inhale 2 puffs into the lungs 4 (four) times daily.         . cetirizine (ZYRTEC) 10 MG tablet   Oral   Take 10 mg by mouth daily.         . furosemide (LASIX) 20 MG tablet   Oral   Take 20 mg by mouth daily.         . hydrochlorothiazide (HYDRODIURIL) 25 MG tablet   Oral   Take 25 mg by mouth daily.         . medroxyPROGESTERone (PROVERA) 10 MG tablet   Oral   Take 1 tablet (10 mg total) by mouth daily.   36 tablet  1   . metFORMIN (GLUCOPHAGE) 500 MG tablet   Oral   Take 500 mg by mouth 2 (two) times daily with a meal.         . naproxen (NAPROSYN) 500 MG tablet   Oral   Take 500 mg by mouth 2 (two) times daily with a meal.         . guaiFENesin (ROBITUSSIN) 100 MG/5ML liquid   Oral   Take 5-10 mLs (100-200 mg total) by mouth every 4 (four) hours as needed for cough.   60 mL   0   . sodium chloride (OCEAN) 0.65 % SOLN nasal spray   Each Nare   Place 1 spray into both nostrils as needed for congestion.   1 Bottle   0    BP 149/89  Pulse 87  Temp(Src) 99 F (37.2 C) (Oral)  Resp 16  SpO2 100%  LMP 02/19/2014 Physical Exam  Nursing note and vitals reviewed. Constitutional: She appears well-developed and well-nourished. No distress.  Pt sitting  comfortably in exam bed, NAD.   HENT:  Head: Normocephalic and atraumatic.  Eyes: Conjunctivae are normal. No scleral icterus.  Neck: Normal range of motion. Neck supple.  Cardiovascular: Normal rate, regular rhythm and normal heart sounds.   Pulmonary/Chest: Effort normal and breath sounds normal. No respiratory distress. She has no wheezes. She has no rales. She exhibits no tenderness.  No respiratory distress, able to speak in full sentences w/o difficulty. Lungs: CTAB  Abdominal: Soft. Bowel sounds are normal. She exhibits no distension and no mass. There is no tenderness. There is no rebound and no guarding.  Soft, non-distended, non-tender. No CVAT  Musculoskeletal: Normal range of motion.  Neurological: She is alert.  Skin: Skin is warm and dry. She is not diaphoretic.    ED Course  Procedures (including critical care time) Labs Review Labs Reviewed  URINALYSIS, ROUTINE W REFLEX MICROSCOPIC - Abnormal; Notable for the following:    Glucose, UA >1000 (*)    Hgb urine dipstick TRACE (*)    All other components within normal limits  CBG MONITORING, ED - Abnormal; Notable for the following:    Glucose-Capillary 333 (*)    All other components within normal limits  URINE MICROSCOPIC-ADD ON   Imaging Review No results found.   EKG Interpretation None      MDM   Final diagnoses:  Upper respiratory infection with cough and congestion  Polyuria  Polydipsia  Hyperglycemia    pt with hx of asthma and DM presenting with 1 week hx of productive cough, subjective fever, body aches, polyuria and polydipsia.  On exam, Vitals: Temp-99, otherwise unremarkable   UA: no evidence of UTI but glucose in urine, change from previous UAs.   CBG: 333. Could be due to pt's current URI. Discussed importance of staying well hydrated and taking all medications as prescribed. Discussed importance of regularly checking CBG and f/u with PCP. Return precautions provided. Pt verbalized  understanding and agreement with tx plan.  Discussed pt with Dr. Maryan Rued who agrees with plan.      Noland Fordyce, PA-C 03/13/14 [phone]

## 2014-03-13 NOTE — Discharge Instructions (Signed)
Take 600 mg Ibuprofen (Motrin) every 6-8 hours for fever and pain  Alternate with Tylenol  Take50-1000 mg Tylenol every 4-6 hours as needed for fever and pain  Follow-up with your primary care provider next week for recheck of symptoms if not improving.  Be sure to drink plenty of fluids and rest, at least 8hrs of sleep a night, preferably more while you are sick. Return to the ED if you cannot keep down fluids/signs of dehydration, fever not reducing with Tylenol, difficulty breathing/wheezing, stiff neck, worsening condition, or other concerns.   For your elevated blood sugar, be sure to drink plenty of water through out the day, take all prescription medications as prescribed, follow up with primary care provider next week for recheck of blood glucose.  See further instructions below.

## 2014-03-14 NOTE — ED Provider Notes (Signed)
Medical screening examination/treatment/procedure(s) were performed by non-physician practitioner and as supervising physician I was immediately available for consultation/collaboration.   EKG Interpretation None        Blanchie Dessert, MD 03/14/14 2133

## 2014-05-27 ENCOUNTER — Other Ambulatory Visit: Payer: Self-pay

## 2014-05-27 DIAGNOSIS — Z1231 Encounter for screening mammogram for malignant neoplasm of breast: Secondary | ICD-10-CM

## 2014-05-31 ENCOUNTER — Ambulatory Visit (INDEPENDENT_AMBULATORY_CARE_PROVIDER_SITE_OTHER): Payer: Medicaid Other | Admitting: Obstetrics & Gynecology

## 2014-05-31 ENCOUNTER — Encounter: Payer: Self-pay | Admitting: Obstetrics & Gynecology

## 2014-05-31 VITALS — BP 139/85 | HR 77 | Temp 97.9°F | Wt 263.0 lb

## 2014-05-31 DIAGNOSIS — N912 Amenorrhea, unspecified: Secondary | ICD-10-CM

## 2014-05-31 DIAGNOSIS — Z6841 Body Mass Index (BMI) 40.0 and over, adult: Secondary | ICD-10-CM

## 2014-05-31 NOTE — Progress Notes (Signed)
Latoya Roth is a 43 y.o.who presents for irregular menses.Recent episode of secondary amenorrhea.  There was a withdrawal bleed with a progesterone challenge.  Subsequently she had 3 normal menstrual cycles.  Patient Active Problem List   Diagnosis Date Noted  . BMI 40.0-44.9, adult 01/15/2014  . High blood pressure 01/15/2014  . Amenorrhea 01/15/2014   Past Medical History  Diagnosis Date  . Diabetes mellitus   . Arthritis   . Asthma   . Hypertension   . Depression   . Anxiety     Past Surgical History  Procedure Laterality Date  . Cholecystectomy      Current outpatient prescriptions:albuterol (PROVENTIL) (2.5 MG/3ML) 0.083% nebulizer solution, Take 2.5 mg by nebulization every 6 (six) hours as needed. For shortness of breath, Disp: , Rfl: ;  albuterol-ipratropium (COMBIVENT) 18-103 MCG/ACT inhaler, Inhale 2 puffs into the lungs 4 (four) times daily., Disp: , Rfl: ;  cetirizine (ZYRTEC) 10 MG tablet, Take 10 mg by mouth daily., Disp: , Rfl:  furosemide (LASIX) 20 MG tablet, Take 20 mg by mouth daily., Disp: , Rfl: ;  hydrochlorothiazide (HYDRODIURIL) 25 MG tablet, Take 25 mg by mouth daily., Disp: , Rfl: ;  metFORMIN (GLUCOPHAGE) 500 MG tablet, Take 500 mg by mouth 2 (two) times daily with a meal., Disp: , Rfl: ;  naproxen (NAPROSYN) 500 MG tablet, Take 500 mg by mouth 2 (two) times daily with a meal., Disp: , Rfl:  sodium chloride (OCEAN) 0.65 % SOLN nasal spray, Place 1 spray into both nostrils as needed for congestion., Disp: 1 Bottle, Rfl: 0;  guaiFENesin (ROBITUSSIN) 100 MG/5ML liquid, Take 5-10 mLs (100-200 mg total) by mouth every 4 (four) hours as needed for cough., Disp: 60 mL, Rfl: 0;  medroxyPROGESTERone (PROVERA) 10 MG tablet, Take 1 tablet (10 mg total) by mouth daily., Disp: 36 tablet, Rfl: 1 Allergies  Allergen Reactions  . Penicillins Anaphylaxis  . Shellfish Allergy Anaphylaxis, Hives and Swelling    History  Substance Use Topics  . Smoking status: Never  Smoker   . Smokeless tobacco: Never Used  . Alcohol Use: 0.6 oz/week    1 Cans of beer per week     Comment: 2/day    Family History  Problem Relation Age of Onset  . Hypertension Mother   . Cancer Son      Review of Systems Constitutional: negative for fatigue and weight loss Respiratory: negative for cough and wheezing Cardiovascular: negative for chest pain, fatigue and palpitations Gastrointestinal: negative for abdominal pain and change in bowel habits Genitourinary:negative for vaginal discharge Integument/breast: negative for nipple discharge Musculoskeletal:negative for myalgias Neurological: negative for gait problems and tremors Behavioral/Psych: negative for abusive relationship, depression Endocrine: negative for temperature intolerance     Lab Review Urine pregnancy test Labs reviewed yes Radiologic studies reviewed yes  Objective:  BP 139/85  Pulse 77  Temp(Src) 97.9 F (36.6 C)  Wt 119.296 kg (263 lb)  LMP 04/30/2014   50% of 15 min visit spent on counseling and coordination of care.  Assessment:    AUB/secondary amenorrhea resolved Small fibroid uterus AMH c/w borderline ovarian reserve     Plan:  Folic acid Weight loss Follow up as needed.

## 2014-05-31 NOTE — Patient Instructions (Signed)
Preparing for Pregnancy Preparing for pregnancy (preconceptual care) by getting counseling and information from your caregiver before getting pregnant is a good idea. It will help you and your baby have a better chance to have a healthy, safe pregnancy and delivery of your baby. Make an appointment with your caregiver to talk about your health, medical, and family history and how to prepare yourself before getting pregnant. Your caregiver will do a complete physical exam and a Pap test. They will want to know:  About you, your spouse or partner, and your family's medical and genetic history.  If you are eating a balanced diet and drinking enough fluids.  What vitamins and mineral supplements you are taking. This includes taking folic acid before getting pregnant to help prevent birth defects.  What medications you are taking including prescription, over-the-counter and herbal medications.  If there is any substance abuse like alcohol, smoking, and illegal drugs.  If there is any mental or physical domestic violence.  If there is any risk of sexually transmitted disease between you and your partner.  What immunizations and vaccinations you have had and what you may need before getting pregnant.  If you should get tested for HIV infection.  If there is any exposure to chemical or toxic substances at home or work.  If there are medical problems you have that need to be treated and kept under control before getting pregnant such as diabetes, high blood pressure or others.  If there were any past surgeries, pregnancies and problems with them.  What your current weight is and to set a goal as to how much weight you should gain while pregnant. Also, they will check if you should lose or gain weight before getting pregnant.  What is your exercise routine and what it is safe when you are pregnant.  If there are any physical disabilities that need to be addressed.  About spacing your  pregnancies when there are other children.  If there is a financial problem that may affect you having a child. After talking about the above points with your caregiver, your caregiver will give you advice on how to help treat and work with you on solving any issues, if necessary, before getting pregnant. The goal is to have a healthy and safe pregnancy for you and your baby. You should keep an accurate record of your menstrual periods because it will help in determining your due date. Immunizations that you should have before getting pregnant:   Regular measles, German measles (rubella) and mumps.  Tetanus and diphtheria.  Chickenpox, if not immune.  Herpes zoster (Varicella) if not immune.  Human papilloma virus vaccine (HPV) between the age of 9 and 26 years old.  Hepatitis A vaccine.  Hepatitis B vaccine.  Influenza vaccine.  Pneumococcal vaccine (pneumonia). You should avoid getting pregnant for one month after getting vaccinated with a live virus vaccine such as German measles (rubella) which is in the MMR (Measles, Mumps and Rubella) vaccine. Other immunizations may be necessary depending on where you live, such as malaria. Ask your caregiver if any other immunizations are needed for you. HOME CARE INSTRUCTIONS   Follow the advice of your caregiver.  Before getting pregnant:  Begin taking vitamins, supplements, and 0.4 milligrams folic acid daily.  Get your immunizations up-to-date.  Get help from a nutrition counselor if you do not understand what a balanced diet is, need help with a special medical diet or if you need help to lose or gain weight.    Begin exercising.  Stop smoking, taking illegal drugs, and drinking alcoholic beverages.  Get counseling if there is and type of domestic violence.  Get checked for sexually transmitted diseases including HIV.  Get any medical problems under control (diabetes, high blood pressure, convulsions, asthma or  others).  Resolve any financial concerns or create a plan to do so.  Be sure you and your spouse or partner are ready to have a baby.  Keep an accurate record of your menstrual periods. Document Released: 11/22/2008 Document Revised: 09/30/2013 Document Reviewed: 11/22/2008 [facility] Patient Information 2014 [place].

## 2014-06-02 ENCOUNTER — Inpatient Hospital Stay: Admission: RE | Admit: 2014-06-02 | Payer: Medicaid Other | Source: Ambulatory Visit

## 2014-06-07 ENCOUNTER — Encounter (HOSPITAL_COMMUNITY): Payer: Self-pay | Admitting: Emergency Medicine

## 2014-06-07 ENCOUNTER — Ambulatory Visit (HOSPITAL_COMMUNITY)
Admission: RE | Admit: 2014-06-07 | Discharge: 2014-06-07 | Disposition: A | Discharge status: Home / Self Care | Payer: Medicaid Other | Source: Ambulatory Visit | Attending: Family Medicine | Admitting: Family Medicine

## 2014-06-07 ENCOUNTER — Emergency Department (INDEPENDENT_AMBULATORY_CARE_PROVIDER_SITE_OTHER)
Admission: EM | Admit: 2014-06-07 | Discharge: 2014-06-07 | Disposition: A | Discharge status: Home / Self Care | Payer: Medicaid Other | Source: Home / Self Care | Attending: Family Medicine | Admitting: Family Medicine

## 2014-06-07 ENCOUNTER — Other Ambulatory Visit (HOSPITAL_COMMUNITY): Payer: Self-pay | Admitting: Family Medicine

## 2014-06-07 DIAGNOSIS — M79609 Pain in unspecified limb: Secondary | ICD-10-CM | POA: Insufficient documentation

## 2014-06-07 DIAGNOSIS — M25579 Pain in unspecified ankle and joints of unspecified foot: Secondary | ICD-10-CM

## 2014-06-07 DIAGNOSIS — M7989 Other specified soft tissue disorders: Secondary | ICD-10-CM

## 2014-06-07 DIAGNOSIS — M25473 Effusion, unspecified ankle: Secondary | ICD-10-CM

## 2014-06-07 DIAGNOSIS — R600 Localized edema: Secondary | ICD-10-CM

## 2014-06-07 DIAGNOSIS — R609 Edema, unspecified: Secondary | ICD-10-CM

## 2014-06-07 MED ORDER — INDOMETHACIN 25 MG PO CAPS: 25.0000 mg | ORAL_CAPSULE | Freq: Three times a day (TID) | ORAL | Status: DC | PRN

## 2014-06-07 NOTE — Discharge Instructions (Signed)
Your ultrasound of your leg was without evidence of a blood clot. Continue to elevate foot as much as possible and apply ice to try to reduce swelling. You may take medication as prescribed for pain and swelling as well. Please follow up with your doctor if symptoms do not improve. Splint as needed for comfort during the day. Do not sleep in splint.  Peripheral Edema You have swelling in your legs (peripheral edema). This swelling is due to excess accumulation of salt and water in your body. Edema may be a sign of heart, kidney or liver disease, or a side effect of a medication. It may also be due to problems in the leg veins. Elevating your legs and using special support stockings may be very helpful, if the cause of the swelling is due to poor venous circulation. Avoid long periods of standing, whatever the cause. Treatment of edema depends on identifying the cause. Chips, pretzels, pickles and other salty foods should be avoided. Restricting salt in your diet is almost always needed. Water pills (diuretics) are often used to remove the excess salt and water from your body via urine. These medicines prevent the kidney from reabsorbing sodium. This increases urine flow. Diuretic treatment may also result in lowering of potassium levels in your body. Potassium supplements may be needed if you have to use diuretics daily. Daily weights can help you keep track of your progress in clearing your edema. You should call your caregiver for follow up care as recommended. SEEK IMMEDIATE MEDICAL CARE IF:   You have increased swelling, pain, redness, or heat in your legs.  You develop shortness of breath, especially when lying down.  You develop chest or abdominal pain, weakness, or fainting.  You have a fever. Document Released: 01/17/2005 Document Revised: 03/03/2012 Document Reviewed: 12/28/2009 [facility] Patient Information 2014 [place].

## 2014-06-07 NOTE — ED Notes (Signed)
C/o left foot pain and swelling since last Thursday  States she does take fluid pill  Did elevate and ice foot Denies any injury States she used naproxen for tx

## 2014-06-07 NOTE — Progress Notes (Signed)
*  PRELIMINARY RESULTS* Vascular Ultrasound Left lower extremity venous duplex has been completed.  Preliminary findings: no evidence of DVT or baker's cyst.  Called results to Annett Gula.   Landry Mellow, RDMS, RVT  06/07/2014, 5:22 PM

## 2014-06-07 NOTE — ED Provider Notes (Signed)
CSN: [id number]     Arrival date & time 06/07/14  1338 History   First MD Initiated Contact with Patient 06/07/14 1556     Chief Complaint  Patient presents with  . Foot Pain   (Consider location/radiation/quality/duration/timing/severity/associated sxs/prior Treatment) HPI Comments: Patient reports progressive pain and swelling of left foot, ankle and calf over past 5 days without history of injury or hx of gout. Right lower extremity without issue. Denies CP, dyspnea or palpitations. Patient states this condition has occurred intermittently over the past three years.  PCP: Dr. Jeanie Cooks  The history is provided by the patient.    Past Medical History  Diagnosis Date  . Diabetes mellitus   . Arthritis   . Asthma   . Hypertension   . Depression   . Anxiety    Past Surgical History  Procedure Laterality Date  . Cholecystectomy     Family History  Problem Relation Age of Onset  . Hypertension Mother   . Cancer Son    History  Substance Use Topics  . Smoking status: Never Smoker   . Smokeless tobacco: Never Used  . Alcohol Use: 0.6 oz/week    1 Cans of beer per week     Comment: 2/day   OB History   Grav Para Term Preterm Abortions TAB SAB Ect Mult Living   2 2 2  0 0 0 0 0 0 2     Review of Systems  All other systems reviewed and are negative.   Allergies  Penicillins and Shellfish allergy  Home Medications   Prior to Admission medications   Medication Sig Start Date End Date Taking? Authorizing Provider  albuterol (PROVENTIL) (2.5 MG/3ML) 0.083% nebulizer solution Take 2.5 mg by nebulization every 6 (six) hours as needed. For shortness of breath    Historical Provider, MD  albuterol-ipratropium (COMBIVENT) 18-103 MCG/ACT inhaler Inhale 2 puffs into the lungs 4 (four) times daily.    Historical Provider, MD  cetirizine (ZYRTEC) 10 MG tablet Take 10 mg by mouth daily.    Historical Provider, MD  furosemide (LASIX) 20 MG tablet Take 20 mg by mouth daily.     Historical Provider, MD  guaiFENesin (ROBITUSSIN) 100 MG/5ML liquid Take 5-10 mLs (100-200 mg total) by mouth every 4 (four) hours as needed for cough. 03/13/14   Noland Fordyce, PA-C  hydrochlorothiazide (HYDRODIURIL) 25 MG tablet Take 25 mg by mouth daily.    Historical Provider, MD  indomethacin (INDOCIN) 25 MG capsule Take 1 capsule (25 mg total) by mouth 3 (three) times daily as needed for mild pain or moderate pain (do not use in combination with ibuprofen or naproxen). 06/07/14   Lahoma Rocker, PA  medroxyPROGESTERone (PROVERA) 10 MG tablet Take 1 tablet (10 mg total) by mouth daily. 02/05/14   Lahoma Crocker, MD  metFORMIN (GLUCOPHAGE) 500 MG tablet Take 500 mg by mouth 2 (two) times daily with a meal.    Historical Provider, MD  naproxen (NAPROSYN) 500 MG tablet Take 500 mg by mouth 2 (two) times daily with a meal.    Historical Provider, MD  sodium chloride (OCEAN) 0.65 % SOLN nasal spray Place 1 spray into both nostrils as needed for congestion. 03/13/14   Noland Fordyce, PA-C   BP 167/86  Pulse 84  Temp(Src) 97.7 F (36.5 C) (Oral)  Resp 20  SpO2 97%  LMP 04/30/2014 Physical Exam  Nursing note and vitals reviewed. Constitutional: She is oriented to person, place, and time. She appears well-developed and well-nourished. No  distress.  +obese  HENT:  Head: Normocephalic and atraumatic.  Eyes: Conjunctivae are normal. No scleral icterus.  Cardiovascular: Normal rate, regular rhythm and normal heart sounds.   Pulmonary/Chest: Effort normal and breath sounds normal.  Musculoskeletal: Normal range of motion. She exhibits edema. She exhibits no tenderness.       Left lower leg: She exhibits swelling and edema. She exhibits no tenderness, no bony tenderness, no deformity and no laceration.       Legs: 2+ non-pitting edema. Distal pulses and sensatin intact  Neurological: She is alert and oriented to person, place, and time.  Skin: Skin is warm and dry. No rash noted. No  erythema.  No erythema or induration to suggest cellulitis. Left foot, ankle and knee joints with FROM and without tenderness.   Psychiatric: She has a normal mood and affect. Her behavior is normal.    ED Course  Procedures (including critical care time) Labs Review Labs Reviewed - No data to display  Imaging Review No results found.   MDM   1. Edema of left foot    Sent to [facility] for LLE doppler U/S and study was reported to be negative for DVT. Will advise patient to continue ice and elevation and use indocin as directed. Patient requests splint for her ankle as she feels this would help with swelling, so she was provided with ASO and ice pack. Advised to follow up with her PCP for further investigation.     [place], [place] 06/07/14 [phone]

## 2014-06-07 NOTE — ED Notes (Signed)
Patient transported to vascular. 

## 2014-06-08 NOTE — ED Provider Notes (Signed)
Medical screening examination/treatment/procedure(s) were performed by resident physician or non-physician practitioner and as supervising physician I was immediately available for consultation/collaboration.   Pauline Good MD.   Billy Fischer, MD 06/08/14 [phone]

## 2014-10-25 ENCOUNTER — Encounter (HOSPITAL_COMMUNITY): Payer: Self-pay | Admitting: Emergency Medicine

## 2014-12-20 ENCOUNTER — Encounter: Payer: Self-pay | Admitting: *Deleted

## 2014-12-21 ENCOUNTER — Encounter: Payer: Self-pay | Admitting: Obstetrics & Gynecology

## 2015-06-08 ENCOUNTER — Encounter (HOSPITAL_COMMUNITY): Payer: Self-pay

## 2015-06-08 ENCOUNTER — Emergency Department (HOSPITAL_COMMUNITY): Payer: Medicaid Other

## 2015-06-08 ENCOUNTER — Emergency Department (HOSPITAL_COMMUNITY)
Admission: EM | Admit: 2015-06-08 | Discharge: 2015-06-08 | Disposition: A | Discharge status: Home / Self Care | Payer: Medicaid Other | Attending: Emergency Medicine | Admitting: Emergency Medicine

## 2015-06-08 DIAGNOSIS — R0789 Other chest pain: Secondary | ICD-10-CM | POA: Insufficient documentation

## 2015-06-08 DIAGNOSIS — E119 Type 2 diabetes mellitus without complications: Secondary | ICD-10-CM | POA: Insufficient documentation

## 2015-06-08 DIAGNOSIS — Z791 Long term (current) use of non-steroidal anti-inflammatories (NSAID): Secondary | ICD-10-CM | POA: Diagnosis not present

## 2015-06-08 DIAGNOSIS — Z8659 Personal history of other mental and behavioral disorders: Secondary | ICD-10-CM | POA: Insufficient documentation

## 2015-06-08 DIAGNOSIS — I10 Essential (primary) hypertension: Secondary | ICD-10-CM | POA: Diagnosis not present

## 2015-06-08 DIAGNOSIS — M79662 Pain in left lower leg: Secondary | ICD-10-CM | POA: Insufficient documentation

## 2015-06-08 DIAGNOSIS — Z79899 Other long term (current) drug therapy: Secondary | ICD-10-CM | POA: Diagnosis not present

## 2015-06-08 DIAGNOSIS — M7989 Other specified soft tissue disorders: Secondary | ICD-10-CM | POA: Diagnosis not present

## 2015-06-08 DIAGNOSIS — Z880 Allergy status to penicillin: Secondary | ICD-10-CM | POA: Diagnosis not present

## 2015-06-08 DIAGNOSIS — R079 Chest pain, unspecified: Secondary | ICD-10-CM | POA: Diagnosis present

## 2015-06-08 DIAGNOSIS — J45901 Unspecified asthma with (acute) exacerbation: Secondary | ICD-10-CM | POA: Insufficient documentation

## 2015-06-08 DIAGNOSIS — M1990 Unspecified osteoarthritis, unspecified site: Secondary | ICD-10-CM | POA: Insufficient documentation

## 2015-06-08 LAB — CBC
HEMATOCRIT: 36.8 % (ref 36.0–46.0)
Hemoglobin: 11.9 g/dL — ABNORMAL LOW (ref 12.0–15.0)
MCH: 23.6 pg — ABNORMAL LOW (ref 26.0–34.0)
MCHC: 32.3 g/dL (ref 30.0–36.0)
MCV: 72.9 fL — ABNORMAL LOW (ref 78.0–100.0)
PLATELETS: 349 10*3/uL (ref 150–400)
RBC: 5.05 MIL/uL (ref 3.87–5.11)
RDW: 16.4 % — AB (ref 11.5–15.5)
WBC: 9.1 10*3/uL (ref 4.0–10.5)

## 2015-06-08 LAB — BASIC METABOLIC PANEL
Anion gap: 8 (ref 5–15)
BUN: 6 mg/dL (ref 6–20)
CO2: 27 mmol/L (ref 22–32)
Calcium: 8.1 mg/dL — ABNORMAL LOW (ref 8.9–10.3)
Chloride: 97 mmol/L — ABNORMAL LOW (ref 101–111)
Creatinine, Ser: 0.58 mg/dL (ref 0.44–1.00)
GFR calc non Af Amer: >60 mL/min (ref >60)
GLUCOSE: 291 mg/dL — AB (ref 65–99)
POTASSIUM: 3.4 mmol/L — AB (ref 3.5–5.1)
Sodium: 132 mmol/L — ABNORMAL LOW (ref 135–145)

## 2015-06-08 LAB — D-DIMER, QUANTITATIVE: D-Dimer, Quant: <0.27 ug/mL-FEU (ref 0.00–0.48)

## 2015-06-08 LAB — I-STAT TROPONIN, ED: Troponin i, poc: 0 ng/mL (ref 0.00–0.08)

## 2015-06-08 MED ORDER — IPRATROPIUM-ALBUTEROL 0.5-2.5 (3) MG/3ML IN SOLN
3.0000 mL | Freq: Once | RESPIRATORY_TRACT | Status: AC
  Administered 2015-06-08: 3 mL via RESPIRATORY_TRACT
  Filled 2015-06-08: qty 3

## 2015-06-08 MED ORDER — GI COCKTAIL ~~LOC~~
30.0000 mL | Freq: Once | ORAL | Status: AC
  Administered 2015-06-08: 30 mL via ORAL
  Filled 2015-06-08: qty 30

## 2015-06-08 MED ORDER — PANTOPRAZOLE SODIUM 40 MG PO TBEC
40.0000 mg | DELAYED_RELEASE_TABLET | Freq: Once | ORAL | Status: AC
  Administered 2015-06-08: 40 mg via ORAL
  Filled 2015-06-08: qty 1

## 2015-06-08 NOTE — ED Provider Notes (Signed)
CSN: [id number]     Arrival date & time 06/08/15  0356 History   First MD Initiated Contact with Patient 06/08/15 0451     Chief Complaint  Patient presents with  . Chest Pain  . Shortness of Breath     (Consider location/radiation/quality/duration/timing/severity/associated sxs/prior Treatment) HPI  Pt is a 44yo female with hx of HTN. NIDDM, asthma, HTN, anxiety and depression, presenting to ED with c/o being sick with cough and congestion for 3 days.  This morning she woke around 02:30 with SOB and centralized chest pain that is pressure-like "pain", aching and burning, 7/10. Has not tried anything for pain. When asked about leg pain or swelling pt states "my left leg has poor circulation."  Pt c/o Left lower leg aching and swelling. Denies prior hx of DVT or PE. She is not on blood thinners.  Past Medical History  Diagnosis Date  . Diabetes mellitus   . Arthritis   . Asthma   . Hypertension   . Depression   . Anxiety    Past Surgical History  Procedure Laterality Date  . Cholecystectomy     Family History  Problem Relation Age of Onset  . Hypertension Mother   . Cancer Son    History  Substance Use Topics  . Smoking status: Never Smoker   . Smokeless tobacco: Never Used  . Alcohol Use: 0.6 oz/week    1 Cans of beer per week     Comment: 2/day   OB History    Gravida Para Term Preterm AB TAB SAB Ectopic Multiple Living   2 2 2  0 0 0 0 0 0 2     Review of Systems  Constitutional: Negative for fever, chills, diaphoresis, appetite change and fatigue.  HENT: Positive for congestion.   Respiratory: Positive for cough and shortness of breath.   Cardiovascular: Positive for chest pain and leg swelling (Left). Negative for palpitations.  Gastrointestinal: Negative for nausea, vomiting, abdominal pain and diarrhea.  Musculoskeletal: Positive for myalgias ( Left lower leg). Negative for arthralgias.  All other systems reviewed and are negative.     Allergies   Penicillins and Shellfish allergy  Home Medications   Prior to Admission medications   Medication Sig Start Date End Date Taking? Authorizing Provider  albuterol (PROVENTIL) (2.5 MG/3ML) 0.083% nebulizer solution Take 2.5 mg by nebulization every 6 (six) hours as needed. For shortness of breath    Historical Provider, MD  albuterol-ipratropium (COMBIVENT) 18-103 MCG/ACT inhaler Inhale 2 puffs into the lungs 4 (four) times daily.    Historical Provider, MD  cetirizine (ZYRTEC) 10 MG tablet Take 10 mg by mouth daily.    Historical Provider, MD  furosemide (LASIX) 20 MG tablet Take 20 mg by mouth daily.    Historical Provider, MD  guaiFENesin (ROBITUSSIN) 100 MG/5ML liquid Take 5-10 mLs (100-200 mg total) by mouth every 4 (four) hours as needed for cough. 03/13/14   Noland Fordyce, PA-C  hydrochlorothiazide (HYDRODIURIL) 25 MG tablet Take 25 mg by mouth daily.    Historical Provider, MD  indomethacin (INDOCIN) 25 MG capsule Take 1 capsule (25 mg total) by mouth 3 (three) times daily as needed for mild pain or moderate pain (do not use in combination with ibuprofen or naproxen). 06/07/14   Audelia Hives Presson, PA  medroxyPROGESTERone (PROVERA) 10 MG tablet Take 1 tablet (10 mg total) by mouth daily. 02/05/14   Lahoma Crocker, MD  metFORMIN (GLUCOPHAGE) 500 MG tablet Take 500 mg by mouth 2 (two)  times daily with a meal.    Historical Provider, MD  naproxen (NAPROSYN) 500 MG tablet Take 500 mg by mouth 2 (two) times daily with a meal.    Historical Provider, MD  sodium chloride (OCEAN) 0.65 % SOLN nasal spray Place 1 spray into both nostrils as needed for congestion. 03/13/14   Noland Fordyce, PA-C   BP 127/72 mmHg  Pulse 82  Temp(Src) 98.5 F (36.9 C) (Oral)  Resp 14  SpO2 95%  LMP 04/30/2014 Physical Exam  Constitutional: She appears well-developed and well-nourished. No distress.  HENT:  Head: Normocephalic and atraumatic.  Eyes: Conjunctivae are normal. No scleral icterus.  Neck:  Normal range of motion. Neck supple.  Cardiovascular: Normal rate, regular rhythm and normal heart sounds.   Pulses:      Dorsalis pedis pulses are 2+ on the right side, and 2+ on the left side.  Pulmonary/Chest: Effort normal and breath sounds normal. No respiratory distress. She has no wheezes. She has no rales. She exhibits no tenderness.  Abdominal: Soft. Bowel sounds are normal. She exhibits no distension and no mass. There is no tenderness. There is no rebound and no guarding.  Musculoskeletal: Normal range of motion. She exhibits edema and tenderness.  Bilateral lower leg edema, L>R. Tenderness to Left calf, compartments is soft.  Neurological: She is alert.  Skin: Skin is warm and dry. She is not diaphoretic. No erythema.  Nursing note and vitals reviewed.   ED Course  Procedures (including critical care time) Labs Review Labs Reviewed  CBC - Abnormal; Notable for the following:    Hemoglobin 11.9 (*)    MCV 72.9 (*)    MCH 23.6 (*)    RDW 16.4 (*)    All other components within normal limits  BASIC METABOLIC PANEL  I-STAT TROPOININ, ED    Imaging Review No results found.   EKG Interpretation None      MDM   Final diagnoses:  None   Pt with recent URI symptoms woke this morning with CP and SOB. Hx of poor circulation in Left lower leg.  Pt is low risk for ACS, however, will get D-dimer to help r/o DVT/PE as Left lower leg pain and swelling sound chronic, however, pt unable to state if symptoms in Left leg have worsened.    Cardiac workup: unremarkable.  D-dimer pending. Pt given GI cocktail due to hx of acid reflux. Pt signed out to [employer], PA-C at shift change. Plan is to f/u with D-dimer, reexamine pt and tx appropriately.   Noland Fordyce, PA-C 06/08/15 [id number]  Linton Flemings, MD 06/08/15 [phone]

## 2015-06-08 NOTE — ED Provider Notes (Signed)
PROGRESS NOTE                                                                                                                 This is a sign-out from [place] at shift change: Latoya Roth is a 44 y.o. female presenting with Q to onset of chest pain which woke her from sleep at 2 AM. EKG is nonischemic, troponin and basic blood work unremarkable. Patient has left lower extremity pain and swelling. D-dimer is ordered. Plan is to follow-up dimer.  Please refer to previous note for full HPI, ROS, PMH and PE.   Dimer is negative, discussed results with patient who states she will follow with her primary care doctor at [facility]. Extensive discussion of return precautions. Patient verbalizes understanding.     Monico Blitz, PA-C 06/08/15 3329  Linton Flemings, MD 06/08/15 1840

## 2015-06-08 NOTE — Discharge Instructions (Signed)
Please follow with your primary care doctor in the next 2 days for a check-up. They must obtain records for further management.   Do not hesitate to return to the Emergency Department for any new, worsening or concerning symptoms.    Chest Pain (Nonspecific) It is often hard to give a diagnosis for the cause of chest pain. There is always a chance that your pain could be related to something serious, such as a heart attack or a blood clot in the lungs. You need to follow up with your doctor. HOME CARE  If antibiotic medicine was given, take it as directed by your doctor. Finish the medicine even if you start to feel better.  For the next few days, avoid activities that bring on chest pain. Continue physical activities as told by your doctor.  Do not use any tobacco products. This includes cigarettes, chewing tobacco, and e-cigarettes.  Avoid drinking alcohol.  Only take medicine as told by your doctor.  Follow your doctor's suggestions for more testing if your chest pain does not go away.  Keep all doctor visits you made. GET HELP IF:  Your chest pain does not go away, even after treatment.  You have a rash with blisters on your chest.  You have a fever. GET HELP RIGHT AWAY IF:   You have more pain or pain that spreads to your arm, neck, jaw, back, or belly (abdomen).  You have shortness of breath.  You cough more than usual or cough up blood.  You have very bad back or belly pain.  You feel sick to your stomach (nauseous) or throw up (vomit).  You have very bad weakness.  You pass out (faint).  You have chills. This is an emergency. Do not wait to see if the problems will go away. Call your local emergency services (911 in [place] Do not drive yourself to the hospital. MAKE SURE YOU:   Understand these instructions.  Will watch your condition.  Will get help right away if you are not doing well or get worse. Document Released: 05/28/2008 Document Revised:  12/15/2013 Document Reviewed: 05/28/2008 [facility] Patient Information 2015 [place], [place]. This information is not intended to replace advice given to you by your health care provider. Make sure you discuss any questions you have with your health care provider.

## 2015-06-08 NOTE — ED Notes (Signed)
Per EMS - pt from home. Sick w/ cold x3 days, woke up around 0230 with shortness of breath and CP. Lungs clear, minor distress. Hx asthma. CP across chest, occurs with cough and deep breathing. No radiation. 98% RA, BP 155/86, hr 78bpm, RR 18. 16G LAC

## 2015-07-30 IMAGING — US US PELVIS COMPLETE
1 series · 14 of 25 positions shown · non-contrast
Comparison: None.

CLINICAL DATA: Pelvic discomfort. Abdominal distention and
bloating. Amenorrhea.

EXAM:
TRANSABDOMINAL ULTRASOUND OF PELVIS
TECHNIQUE: Transabdominal ultrasound examination of the pelvis was performed
including evaluation of the uterus, ovaries, adnexal regions, and
pelvic cul-de-sac. Patient refused transvaginal sonography.

[Series 1: us pelvis complete · 31 acquisitions, 14 frames shown]
[im 1/31]
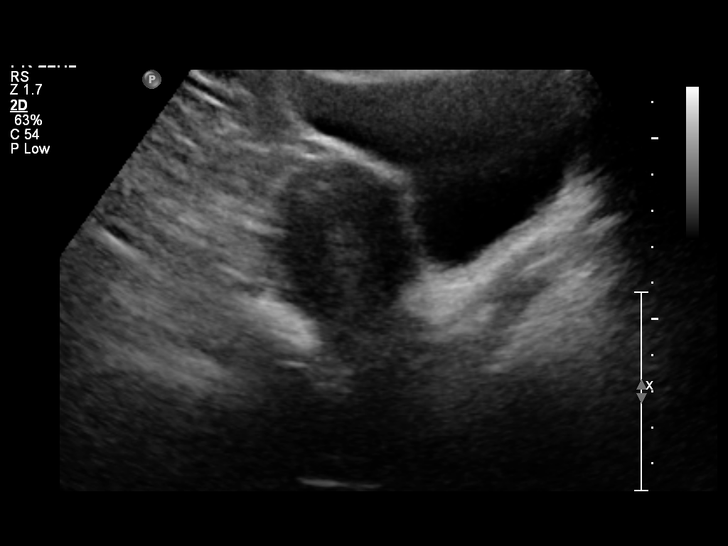
[im 3/31]
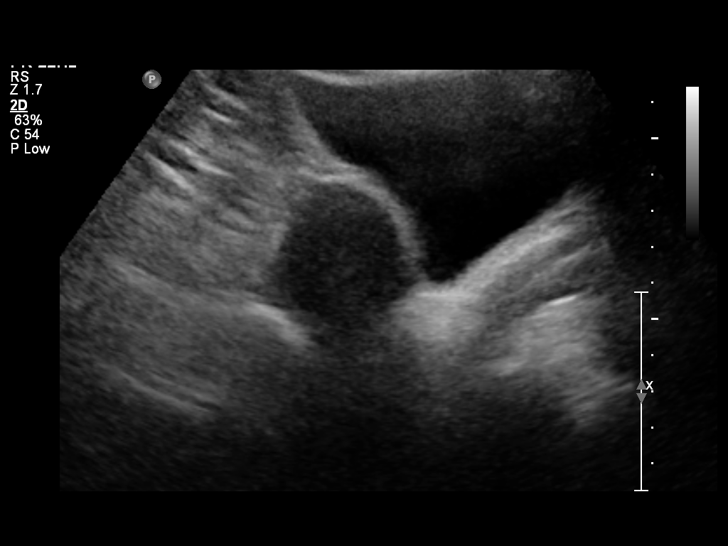
[im 6/31]
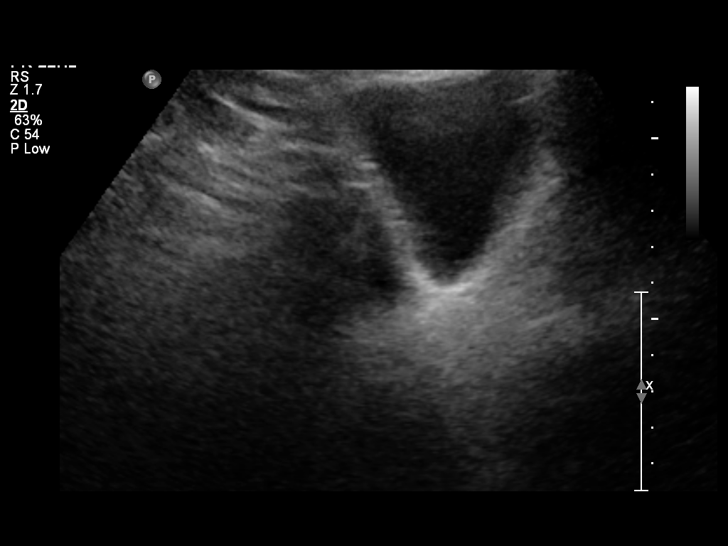
[im 8/31]
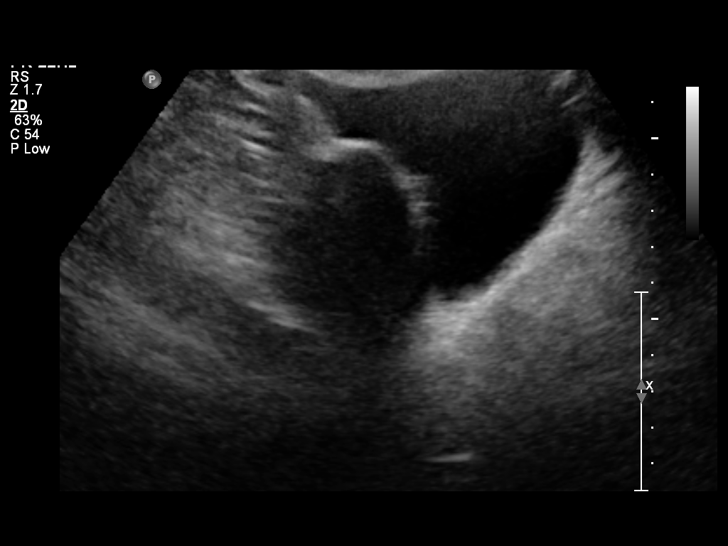
[im 11/31]
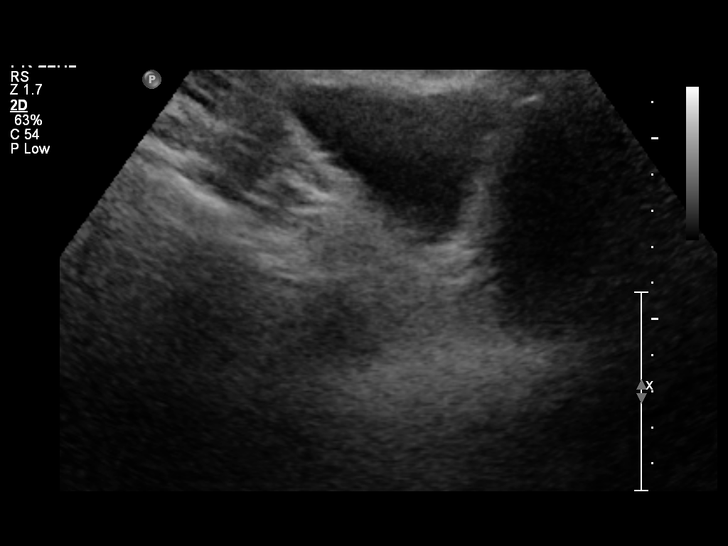
[im 12/31]
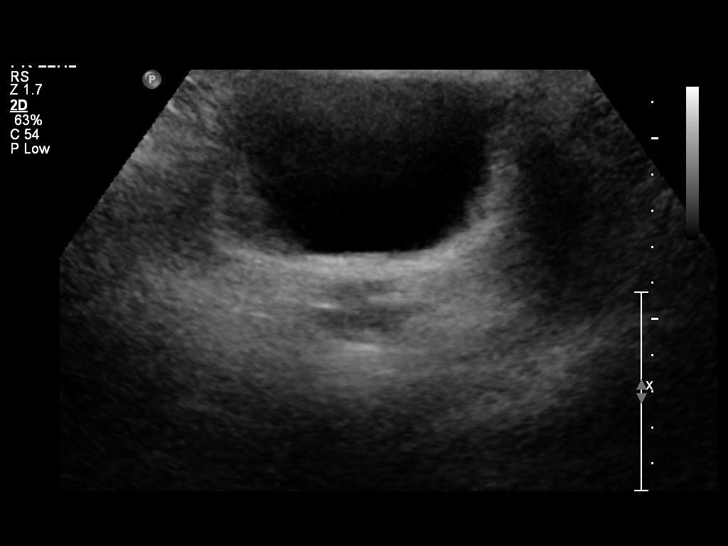
[im 14/31]
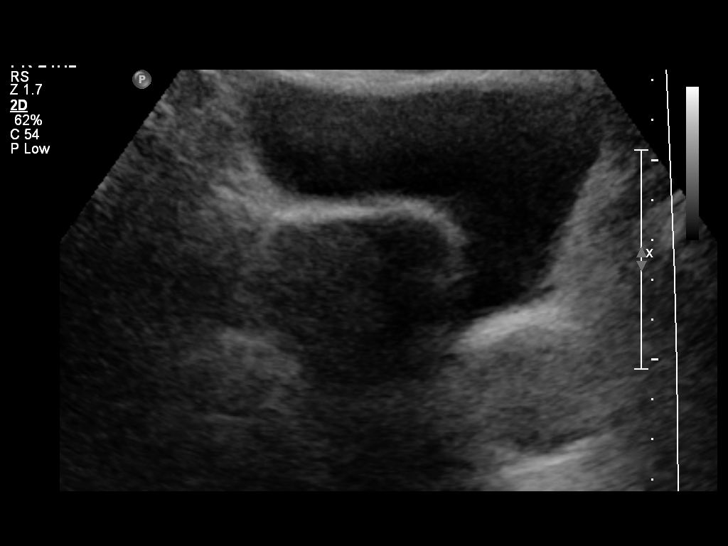
[im 17/31]
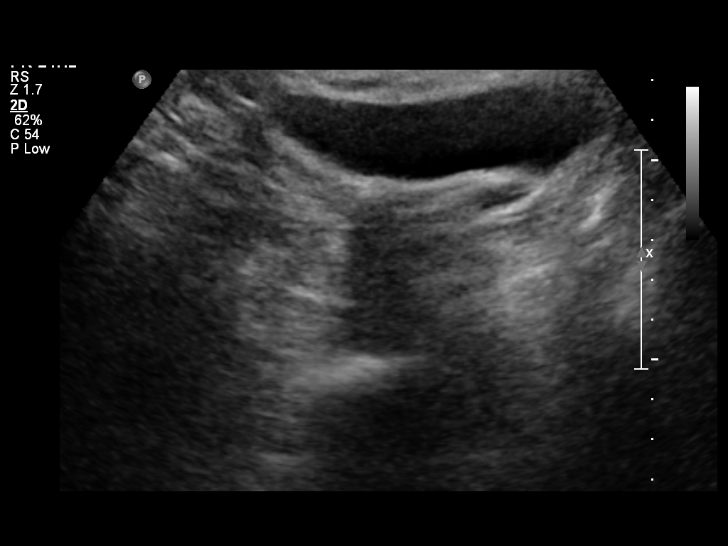
[im 19/31]
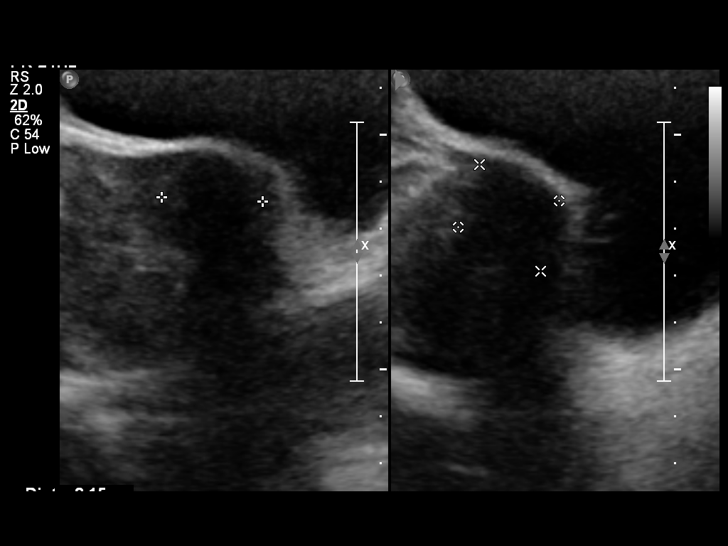
[im 21/31]
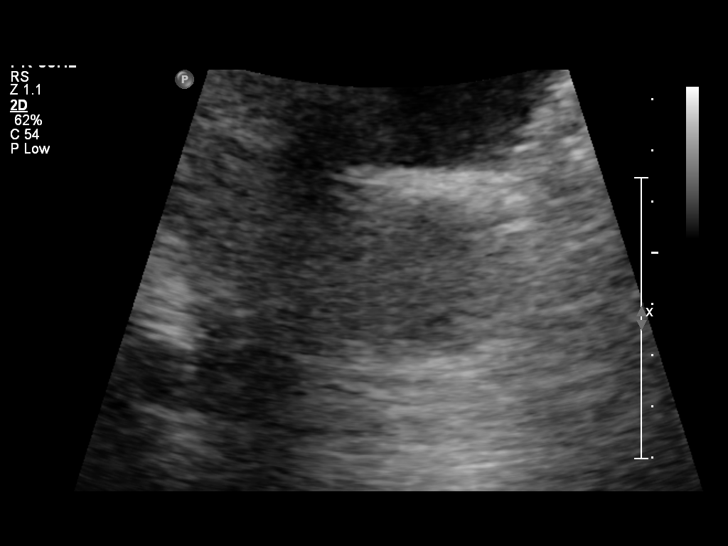
[im 23/31]
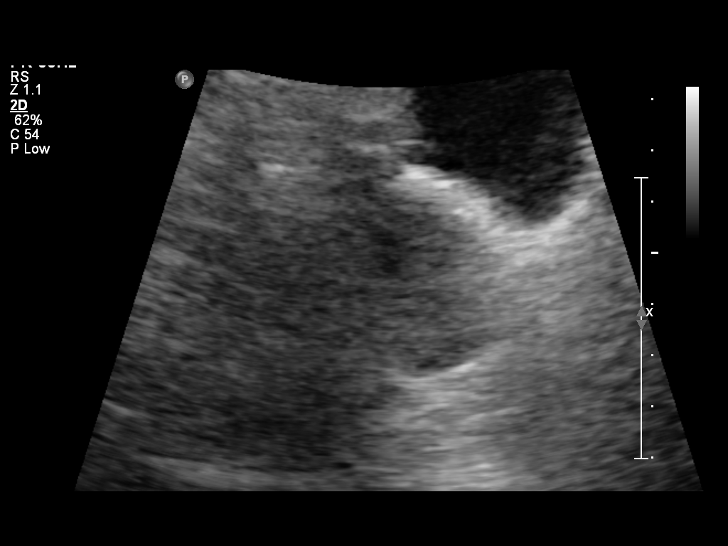
[im 26/31]
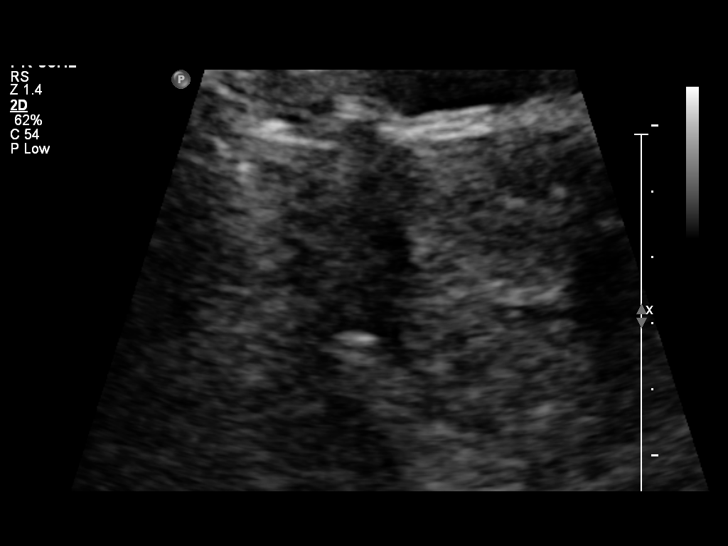
[im 28/31]
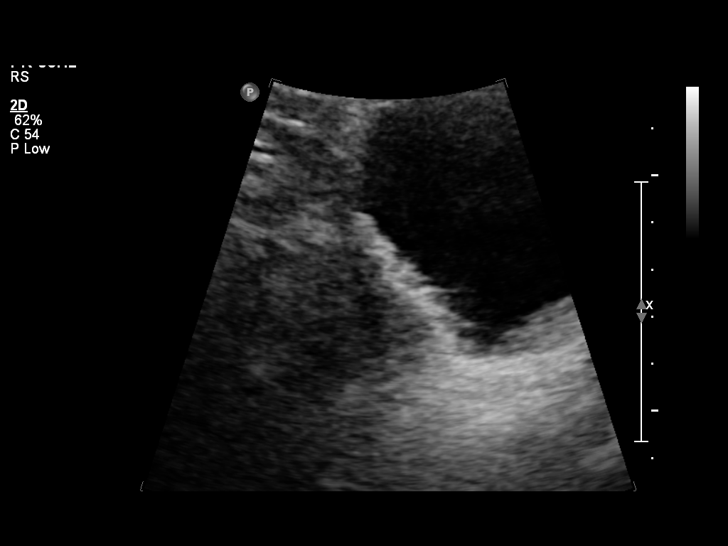
[im 31/31]
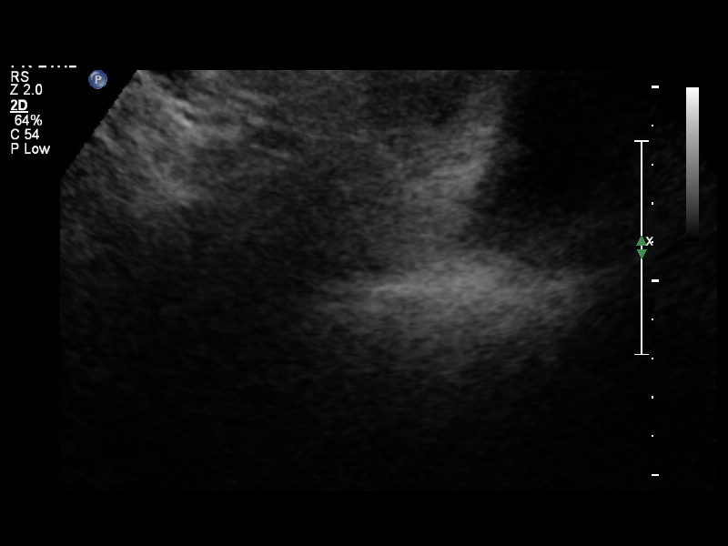

[14 of 25 positions shown; findings below may reference images not displayed]

FINDINGS: Uterus

Measurements: 8.4 x 3.6 by 5.0 cm. An intramural fibroid is seen in
the left fundal region which measures 2.6 cm in maximum diameter. No
other fibroids visualized by trans abdominal sonography.

Endometrium

Thickness: Approximately 7 mm measured transabdominally. No focal
abnormality visualized transabdominally.

Right ovary

Measurements: 2.9 x 2.5 x 1.9 cm. Normal appearance/no adnexal mass.

Left ovary

Measurements: 3.7 x 3.0 x 4.2 cm. Normal appearance/no adnexal mass.

Other findings:  No free fluid
IMPRESSION: 2.6 cm left fundal fibroid. No other definite fibroids visualized by
transabdominal sonography. Transvaginal sonography was refused by
patient.

Normal transabdominal appearance of both ovaries. No adnexal mass
identified.

## 2016-04-12 ENCOUNTER — Encounter (HOSPITAL_COMMUNITY): Payer: Self-pay | Admitting: Emergency Medicine

## 2016-04-12 ENCOUNTER — Emergency Department (HOSPITAL_COMMUNITY)
Admission: EM | Admit: 2016-04-12 | Discharge: 2016-04-13 | Disposition: A | Discharge status: Home / Self Care | Payer: Medicaid Other | Attending: Emergency Medicine | Admitting: Emergency Medicine

## 2016-04-12 ENCOUNTER — Emergency Department (HOSPITAL_COMMUNITY): Payer: Medicaid Other

## 2016-04-12 DIAGNOSIS — Z7951 Long term (current) use of inhaled steroids: Secondary | ICD-10-CM | POA: Diagnosis not present

## 2016-04-12 DIAGNOSIS — Z880 Allergy status to penicillin: Secondary | ICD-10-CM | POA: Diagnosis not present

## 2016-04-12 DIAGNOSIS — E119 Type 2 diabetes mellitus without complications: Secondary | ICD-10-CM | POA: Diagnosis not present

## 2016-04-12 DIAGNOSIS — J45909 Unspecified asthma, uncomplicated: Secondary | ICD-10-CM | POA: Insufficient documentation

## 2016-04-12 DIAGNOSIS — E669 Obesity, unspecified: Secondary | ICD-10-CM | POA: Diagnosis not present

## 2016-04-12 DIAGNOSIS — R509 Fever, unspecified: Secondary | ICD-10-CM | POA: Diagnosis present

## 2016-04-12 DIAGNOSIS — J111 Influenza due to unidentified influenza virus with other respiratory manifestations: Secondary | ICD-10-CM | POA: Diagnosis not present

## 2016-04-12 DIAGNOSIS — Z7984 Long term (current) use of oral hypoglycemic drugs: Secondary | ICD-10-CM | POA: Diagnosis not present

## 2016-04-12 DIAGNOSIS — I10 Essential (primary) hypertension: Secondary | ICD-10-CM | POA: Diagnosis not present

## 2016-04-12 DIAGNOSIS — F419 Anxiety disorder, unspecified: Secondary | ICD-10-CM | POA: Insufficient documentation

## 2016-04-12 DIAGNOSIS — Z79899 Other long term (current) drug therapy: Secondary | ICD-10-CM | POA: Insufficient documentation

## 2016-04-12 DIAGNOSIS — Z3202 Encounter for pregnancy test, result negative: Secondary | ICD-10-CM | POA: Insufficient documentation

## 2016-04-12 DIAGNOSIS — M1990 Unspecified osteoarthritis, unspecified site: Secondary | ICD-10-CM | POA: Diagnosis not present

## 2016-04-12 HISTORY — DX: Obesity, unspecified: E66.9

## 2016-04-12 LAB — URINALYSIS, ROUTINE W REFLEX MICROSCOPIC
BILIRUBIN URINE: NEGATIVE
Glucose, UA: 500 mg/dL — AB
KETONES UR: NEGATIVE mg/dL
NITRITE: NEGATIVE
Protein, ur: 30 mg/dL — AB
SPECIFIC GRAVITY, URINE: 1.009 (ref 1.005–1.030)
pH: 6 (ref 5.0–8.0)

## 2016-04-12 LAB — COMPREHENSIVE METABOLIC PANEL
ALT: 20 U/L (ref 14–54)
ANION GAP: 14 (ref 5–15)
AST: 27 U/L (ref 15–41)
Albumin: 2.9 g/dL — ABNORMAL LOW (ref 3.5–5.0)
Alkaline Phosphatase: 104 U/L (ref 38–126)
BUN: <5 mg/dL — ABNORMAL LOW (ref 6–20)
CHLORIDE: 93 mmol/L — AB (ref 101–111)
CO2: 23 mmol/L (ref 22–32)
CREATININE: 0.83 mg/dL (ref 0.44–1.00)
Calcium: 9 mg/dL (ref 8.9–10.3)
Glucose, Bld: 314 mg/dL — ABNORMAL HIGH (ref 65–99)
Potassium: 3.4 mmol/L — ABNORMAL LOW (ref 3.5–5.1)
Sodium: 130 mmol/L — ABNORMAL LOW (ref 135–145)
Total Bilirubin: 0.4 mg/dL (ref 0.3–1.2)
Total Protein: 8.1 g/dL (ref 6.5–8.1)

## 2016-04-12 LAB — URINE MICROSCOPIC-ADD ON

## 2016-04-12 LAB — CBC WITH DIFFERENTIAL/PLATELET
Basophils Absolute: 0 10*3/uL (ref 0.0–0.1)
Basophils Relative: 0 %
EOS PCT: 0 %
Eosinophils Absolute: 0 10*3/uL (ref 0.0–0.7)
HCT: 38 % (ref 36.0–46.0)
Hemoglobin: 12.6 g/dL (ref 12.0–15.0)
LYMPHS ABS: 2.1 10*3/uL (ref 0.7–4.0)
LYMPHS PCT: 21 %
MCH: 25.3 pg — AB (ref 26.0–34.0)
MCHC: 33.2 g/dL (ref 30.0–36.0)
MCV: 76.2 fL — AB (ref 78.0–100.0)
MONO ABS: 0.6 10*3/uL (ref 0.1–1.0)
MONOS PCT: 6 %
Neutro Abs: 7 10*3/uL (ref 1.7–7.7)
Neutrophils Relative %: 73 %
PLATELETS: 302 10*3/uL (ref 150–400)
RBC: 4.99 MIL/uL (ref 3.87–5.11)
RDW: 16.4 % — AB (ref 11.5–15.5)
WBC: 9.7 10*3/uL (ref 4.0–10.5)

## 2016-04-12 LAB — POC URINE PREG, ED: PREG TEST UR: NEGATIVE

## 2016-04-12 MED ORDER — ACETAMINOPHEN 325 MG PO TABS
650.0000 mg | ORAL_TABLET | Freq: Once | ORAL | Status: AC
  Administered 2016-04-12: 650 mg via ORAL

## 2016-04-12 MED ORDER — ACETAMINOPHEN 325 MG PO TABS
ORAL_TABLET | ORAL | Status: AC
  Administered 2016-04-12: 650 mg via ORAL
  Filled 2016-04-12: qty 2

## 2016-04-12 NOTE — ED Notes (Signed)
Pt. reports productive cough with emesis , fever and fatigue onset today .

## 2016-04-13 MED ORDER — ONDANSETRON HCL 4 MG/2ML IJ SOLN: 4.0000 mg | Freq: Once | INTRAMUSCULAR | Status: DC

## 2016-04-13 MED ORDER — SODIUM CHLORIDE 0.9 % IV BOLUS (SEPSIS)
1000.0000 mL | Freq: Once | INTRAVENOUS | Status: AC
  Administered 2016-04-13: 1000 mL via INTRAVENOUS

## 2016-04-13 MED ORDER — KETOROLAC TROMETHAMINE 30 MG/ML IJ SOLN
30.0000 mg | Freq: Once | INTRAMUSCULAR | Status: AC
  Administered 2016-04-13: 30 mg via INTRAVENOUS
  Filled 2016-04-13: qty 1

## 2016-04-13 MED ORDER — KETOROLAC TROMETHAMINE 30 MG/ML IJ SOLN: 30.0000 mg | Freq: Once | INTRAMUSCULAR | Status: DC

## 2016-04-13 MED ORDER — SODIUM CHLORIDE 0.9 % IV BOLUS (SEPSIS): 1000.0000 mL | Freq: Once | INTRAVENOUS | Status: DC

## 2016-04-13 NOTE — ED Provider Notes (Signed)
CSN: [id number]     Arrival date & time 04/12/16  2113 History   By signing my name below, I, Altamease Oiler, attest that this documentation has been prepared under the direction and in the presence of Everlene Balls, MD. Electronically Signed: Altamease Oiler, ED Scribe. 04/13/2016. 1:39 AM    Chief Complaint  Patient presents with  . Cough  . Emesis    The history is provided by the patient. No language interpreter was used.   KISTA HUSCHKA is a 45 y.o. female who presents to the Emergency Department complaining of constant and diffuse left sided pain with onset 2 days ago. Pt states "my whole left side hurts".  The pain has no modifying factors. Associated symptoms include subjective fever, decreased appetite, 1 episode of emesis, and cough. She also complains of increased frequency stating "every time I drink water it comes right back out". Pt denies rhinorrhea. Pt was vaccinated against influenza this season.  Past Medical History  Diagnosis Date  . Diabetes mellitus   . Arthritis   . Asthma   . Hypertension   . Depression   . Anxiety   . Obesity    Past Surgical History  Procedure Laterality Date  . Cholecystectomy     Family History  Problem Relation Age of Onset  . Hypertension Mother   . Cancer Son    Social History  Substance Use Topics  . Smoking status: Never Smoker   . Smokeless tobacco: Never Used  . Alcohol Use: 0.6 oz/week    1 Cans of beer per week     Comment: 2/day   OB History    Gravida Para Term Preterm AB TAB SAB Ectopic Multiple Living   2 2 2  0 0 0 0 0 0 2     Review of Systems 10 Systems reviewed and all are negative for acute change except as noted in the HPI.   Allergies  Penicillins and Shellfish allergy  Home Medications   Prior to Admission medications   Medication Sig Start Date End Date Taking? Authorizing Provider  albuterol (PROVENTIL HFA;VENTOLIN HFA) 108 (90 BASE) MCG/ACT inhaler Inhale 2 puffs into the lungs every 6  (six) hours as needed for wheezing or shortness of breath.    Historical Provider, MD  albuterol (PROVENTIL) (2.5 MG/3ML) 0.083% nebulizer solution Take 2.5 mg by nebulization every 6 (six) hours as needed. For shortness of breath    Historical Provider, MD  albuterol-ipratropium (COMBIVENT) 18-103 MCG/ACT inhaler Inhale 2 puffs into the lungs 4 (four) times daily.    Historical Provider, MD  budesonide-formoterol (SYMBICORT) 160-4.5 MCG/ACT inhaler Inhale 2 puffs into the lungs 2 (two) times daily.    Historical Provider, MD  cetirizine (ZYRTEC) 10 MG tablet Take 10 mg by mouth daily.    Historical Provider, MD  glimepiride (AMARYL) 4 MG tablet Take 4 mg by mouth daily with breakfast.    Historical Provider, MD  hydrochlorothiazide (HYDRODIURIL) 25 MG tablet Take 25 mg by mouth daily.    Historical Provider, MD  hydrOXYzine (ATARAX/VISTARIL) 25 MG tablet Take 25 mg by mouth 3 (three) times daily as needed for itching.    Historical Provider, MD  metFORMIN (GLUCOPHAGE) 500 MG tablet Take 500 mg by mouth 2 (two) times daily with a meal.    Historical Provider, MD  Multiple Vitamin (MULTIVITAMIN WITH MINERALS) TABS tablet Take 1 tablet by mouth daily.    Historical Provider, MD  naproxen (NAPROSYN) 500 MG tablet Take 500 mg by mouth  2 (two) times daily as needed for mild pain.     Historical Provider, MD   BP 148/92 mmHg  Pulse 108  Temp(Src) 102.4 F (39.1 C) (Oral)  Resp 18  Ht 5\' 6"  (1.676 m)  Wt 250 lb (113.399 kg)  BMI 40.37 kg/m2  SpO2 97%  LMP 04/30/2014 Physical Exam  Constitutional: She is oriented to person, place, and time. She appears well-developed and well-nourished. No distress.  HENT:  Head: Normocephalic and atraumatic.  Nose: Nose normal.  Mouth/Throat: Oropharynx is clear and moist. No oropharyngeal exudate.  Eyes: Conjunctivae and EOM are normal. Pupils are equal, round, and reactive to light. No scleral icterus.  Neck: Normal range of motion. Neck supple. No JVD  present. No tracheal deviation present. No thyromegaly present.  Cardiovascular: Normal rate, regular rhythm and normal heart sounds.  Exam reveals no gallop and no friction rub.   No murmur heard. Pulmonary/Chest: Effort normal and breath sounds normal. No respiratory distress. She has no wheezes. She exhibits no tenderness.  Abdominal: Soft. Bowel sounds are normal. She exhibits no distension and no mass. There is no tenderness. There is no rebound and no guarding.  Musculoskeletal: Normal range of motion. She exhibits no edema or tenderness.  Lymphadenopathy:    She has no cervical adenopathy.  Neurological: She is alert and oriented to person, place, and time. No cranial nerve deficit. She exhibits normal muscle tone.  Skin: Skin is warm and dry. No rash noted. No erythema. No pallor.  Tactile fever  Nursing note and vitals reviewed.   ED Course  Procedures (including critical care time) DIAGNOSTIC STUDIES: Oxygen Saturation is 97% on RA,  normal by my interpretation.    COORDINATION OF CARE: 1:28 AM Discussed treatment plan which includes lab work, CXR, Toradol, and IVF  with pt at bedside and pt agreed to plan.  Labs Review Labs Reviewed  CBC WITH DIFFERENTIAL/PLATELET - Abnormal; Notable for the following:    MCV 76.2 (*)    MCH 25.3 (*)    RDW 16.4 (*)    All other components within normal limits  COMPREHENSIVE METABOLIC PANEL - Abnormal; Notable for the following:    Sodium 130 (*)    Potassium 3.4 (*)    Chloride 93 (*)    Glucose, Bld 314 (*)    BUN <5 (*)    Albumin 2.9 (*)    All other components within normal limits  URINALYSIS, ROUTINE W REFLEX MICROSCOPIC (NOT AT [facility]) - Abnormal; Notable for the following:    APPearance CLOUDY (*)    Glucose, UA 500 (*)    Hgb urine dipstick LARGE (*)    Protein, ur 30 (*)    Leukocytes, UA MODERATE (*)    All other components within normal limits  URINE MICROSCOPIC-ADD ON - Abnormal; Notable for the following:     Squamous Epithelial / LPF 6-30 (*)    Bacteria, UA FEW (*)    All other components within normal limits  POC URINE PREG, ED    Imaging Review Dg Chest 2 View  04/12/2016  CLINICAL DATA:  Weakness EXAM: CHEST  2 VIEW COMPARISON:  06/08/2015 FINDINGS: Cardiac shadow is within normal limits. The lungs are well aerated bilaterally. No focal infiltrate or sizable effusion is seen. Mild chronic interstitial changes are again noted. No bony abnormality is seen. IMPRESSION: No active cardiopulmonary disease. Electronically Signed   By: Inez Catalina M.D.   On: 04/12/2016 21:56   I have personally reviewed and evaluated  these images and lab results as part of my medical decision-making.   EKG Interpretation None      MDM   Final diagnoses:  None   Patient presents emergency department for multiple complaints consistent with a viral syndrome, possibly influenza. She is initially febrile and tachycardic, this resolved after Tylenol and Toradol given. She was also given 1 L of IV fluids. She is advised that her polyuria and polydipsia is related to her diabetes. Primary care follow-up advised in 3 days.    Vital signs have come back to normal. Patient feels better. She is safe for discharge. Tylenol and ibuprofen recommended at home. Aggressive fluid hydration at home as well.   I personally performed the services described in this documentation, which was scribed in my presence. The recorded information has been reviewed and is accurate.     Everlene Balls, MD 04/13/16 [phone]

## 2016-04-13 NOTE — ED Notes (Signed)
Pt verbalized understanding of d/c instructions and has no further questions. Pt stable and NAD.  

## 2016-04-13 NOTE — Discharge Instructions (Signed)
Influenza, Adult Latoya Roth, you likely have the flu.  Your chest xray does not show pneumonia.  See a primary care doctor within 3 days for close follow up.  Take tylenol and ibuprofen as needed for fever and pain.  Be sure to stay well hydrated and drink plenty of fluids.  If any symptoms worsen, come back to the ED immediately. Thank you. Influenza (flu) is an infection in the mouth, nose, and throat (respiratory tract) caused by a virus. The flu can make you feel very ill. Influenza spreads easily from person to person (contagious).  HOME CARE   Only take medicines as told by your doctor.  Use a cool mist humidifier to make breathing easier.  Get plenty of rest until your fever goes away. This usually takes 3 to 4 days.  Drink enough fluids to keep your pee (urine) clear or pale yellow.  Cover your mouth and nose when you cough or sneeze.  Wash your hands well to avoid spreading the flu.  Stay home from work or school until your fever has been gone for at least 1 full day.  Get a flu shot every year. GET HELP RIGHT AWAY IF:   You have trouble breathing or feel short of breath.  Your skin or nails turn blue.  You have severe neck pain or stiffness.  You have a severe headache, facial pain, or earache.  Your fever gets worse or keeps coming back.  You feel sick to your stomach (nauseous), throw up (vomit), or have watery poop (diarrhea).  You have chest pain.  You have a deep cough that gets worse, or you cough up more thick spit (mucus). MAKE SURE YOU:   Understand these instructions.  Will watch your condition.  Will get help right away if you are not doing well or get worse.   This information is not intended to replace advice given to you by your health care provider. Make sure you discuss any questions you have with your health care provider.   Document Released: 09/18/2008 Document Revised: 12/31/2014 Document Reviewed: 03/10/2012 Elsevier Interactive Patient  Education [employer].

## 2016-04-27 ENCOUNTER — Inpatient Hospital Stay (HOSPITAL_COMMUNITY)
Admission: EM | Admit: 2016-04-27 | Discharge: 2016-04-30 | DRG: 638 | Disposition: A | Discharge status: Home / Self Care | Payer: Medicaid Other | Attending: Internal Medicine | Admitting: Internal Medicine

## 2016-04-27 ENCOUNTER — Encounter (HOSPITAL_COMMUNITY): Payer: Self-pay | Admitting: Family Medicine

## 2016-04-27 ENCOUNTER — Emergency Department (HOSPITAL_COMMUNITY): Payer: Medicaid Other

## 2016-04-27 DIAGNOSIS — E1100 Type 2 diabetes mellitus with hyperosmolarity without nonketotic hyperglycemic-hyperosmolar coma (NKHHC): Principal | ICD-10-CM | POA: Diagnosis present

## 2016-04-27 DIAGNOSIS — E860 Dehydration: Secondary | ICD-10-CM | POA: Diagnosis present

## 2016-04-27 DIAGNOSIS — M1990 Unspecified osteoarthritis, unspecified site: Secondary | ICD-10-CM | POA: Diagnosis present

## 2016-04-27 DIAGNOSIS — R531 Weakness: Secondary | ICD-10-CM | POA: Diagnosis present

## 2016-04-27 DIAGNOSIS — Z79899 Other long term (current) drug therapy: Secondary | ICD-10-CM

## 2016-04-27 DIAGNOSIS — Z7984 Long term (current) use of oral hypoglycemic drugs: Secondary | ICD-10-CM

## 2016-04-27 DIAGNOSIS — E1310 Other specified diabetes mellitus with ketoacidosis without coma: Secondary | ICD-10-CM

## 2016-04-27 DIAGNOSIS — E876 Hypokalemia: Secondary | ICD-10-CM | POA: Diagnosis present

## 2016-04-27 DIAGNOSIS — Z6841 Body Mass Index (BMI) 40.0 and over, adult: Secondary | ICD-10-CM

## 2016-04-27 DIAGNOSIS — E669 Obesity, unspecified: Secondary | ICD-10-CM | POA: Diagnosis present

## 2016-04-27 DIAGNOSIS — J45909 Unspecified asthma, uncomplicated: Secondary | ICD-10-CM | POA: Diagnosis present

## 2016-04-27 DIAGNOSIS — N179 Acute kidney failure, unspecified: Secondary | ICD-10-CM | POA: Diagnosis present

## 2016-04-27 DIAGNOSIS — I10 Essential (primary) hypertension: Secondary | ICD-10-CM | POA: Diagnosis present

## 2016-04-27 DIAGNOSIS — R1310 Dysphagia, unspecified: Secondary | ICD-10-CM

## 2016-04-27 DIAGNOSIS — J45901 Unspecified asthma with (acute) exacerbation: Secondary | ICD-10-CM | POA: Diagnosis present

## 2016-04-27 DIAGNOSIS — F419 Anxiety disorder, unspecified: Secondary | ICD-10-CM | POA: Diagnosis present

## 2016-04-27 DIAGNOSIS — B370 Candidal stomatitis: Secondary | ICD-10-CM | POA: Diagnosis present

## 2016-04-27 DIAGNOSIS — N390 Urinary tract infection, site not specified: Secondary | ICD-10-CM | POA: Diagnosis present

## 2016-04-27 DIAGNOSIS — F329 Major depressive disorder, single episode, unspecified: Secondary | ICD-10-CM | POA: Diagnosis present

## 2016-04-27 DIAGNOSIS — E1101 Type 2 diabetes mellitus with hyperosmolarity with coma: Secondary | ICD-10-CM | POA: Diagnosis not present

## 2016-04-27 DIAGNOSIS — E1110 Type 2 diabetes mellitus with ketoacidosis without coma: Secondary | ICD-10-CM | POA: Insufficient documentation

## 2016-04-27 DIAGNOSIS — J4520 Mild intermittent asthma, uncomplicated: Secondary | ICD-10-CM | POA: Diagnosis not present

## 2016-04-27 LAB — CBC
HCT: 41.4 % (ref 36.0–46.0)
HEMOGLOBIN: 13.4 g/dL (ref 12.0–15.0)
MCH: 24.1 pg — ABNORMAL LOW (ref 26.0–34.0)
MCHC: 32.4 g/dL (ref 30.0–36.0)
MCV: 74.6 fL — ABNORMAL LOW (ref 78.0–100.0)
PLATELETS: 499 10*3/uL — AB (ref 150–400)
RBC: 5.55 MIL/uL — ABNORMAL HIGH (ref 3.87–5.11)
RDW: 16.3 % — AB (ref 11.5–15.5)
WBC: 8.7 10*3/uL (ref 4.0–10.5)

## 2016-04-27 LAB — BASIC METABOLIC PANEL
ANION GAP: 19 — AB (ref 5–15)
Anion gap: 16 — ABNORMAL HIGH (ref 5–15)
Anion gap: 15 (ref 5–15)
BUN: 13 mg/dL (ref 6–20)
BUN: 12 mg/dL (ref 6–20)
BUN: 11 mg/dL (ref 6–20)
CALCIUM: 9.1 mg/dL (ref 8.9–10.3)
CHLORIDE: 92 mmol/L — AB (ref 101–111)
CHLORIDE: 94 mmol/L — AB (ref 101–111)
CO2: 24 mmol/L (ref 22–32)
CO2: 27 mmol/L (ref 22–32)
CO2: 26 mmol/L (ref 22–32)
CREATININE: 1.41 mg/dL — AB (ref 0.44–1.00)
CREATININE: 0.99 mg/dL (ref 0.44–1.00)
CREATININE: 0.95 mg/dL (ref 0.44–1.00)
Calcium: 8.7 mg/dL — ABNORMAL LOW (ref 8.9–10.3)
Calcium: 8.4 mg/dL — ABNORMAL LOW (ref 8.9–10.3)
Chloride: 87 mmol/L — ABNORMAL LOW (ref 101–111)
GFR calc Af Amer: 51 mL/min — ABNORMAL LOW (ref >60)
GFR calc non Af Amer: >60 mL/min (ref >60)
GFR calc non Af Amer: >60 mL/min (ref >60)
GFR, EST NON AFRICAN AMERICAN: 44 mL/min — AB (ref >60)
GLUCOSE: 479 mg/dL — AB (ref 65–99)
Glucose, Bld: 220 mg/dL — ABNORMAL HIGH (ref 65–99)
Glucose, Bld: 132 mg/dL — ABNORMAL HIGH (ref 65–99)
Potassium: 2.7 mmol/L — CL (ref 3.5–5.1)
Potassium: 2.8 mmol/L — ABNORMAL LOW (ref 3.5–5.1)
Potassium: 3.3 mmol/L — ABNORMAL LOW (ref 3.5–5.1)
Sodium: 130 mmol/L — ABNORMAL LOW (ref 135–145)
Sodium: 135 mmol/L (ref 135–145)
Sodium: 135 mmol/L (ref 135–145)

## 2016-04-27 LAB — GLUCOSE, CAPILLARY
GLUCOSE-CAPILLARY: 156 mg/dL — AB (ref 65–99)
Glucose-Capillary: 148 mg/dL — ABNORMAL HIGH (ref 65–99)
Glucose-Capillary: 141 mg/dL — ABNORMAL HIGH (ref 65–99)
Glucose-Capillary: 160 mg/dL — ABNORMAL HIGH (ref 65–99)

## 2016-04-27 LAB — URINE MICROSCOPIC-ADD ON

## 2016-04-27 LAB — CBG MONITORING, ED
GLUCOSE-CAPILLARY: 501 mg/dL — AB (ref 65–99)
GLUCOSE-CAPILLARY: 229 mg/dL — AB (ref 65–99)

## 2016-04-27 LAB — URINALYSIS, ROUTINE W REFLEX MICROSCOPIC
Glucose, UA: >1000 mg/dL — AB
KETONES UR: 15 mg/dL — AB
NITRITE: NEGATIVE
PROTEIN: 100 mg/dL — AB
SPECIFIC GRAVITY, URINE: 1.026 (ref 1.005–1.030)
pH: 6 (ref 5.0–8.0)

## 2016-04-27 LAB — BETA-HYDROXYBUTYRIC ACID: Beta-Hydroxybutyric Acid: 2.13 mmol/L — ABNORMAL HIGH (ref 0.05–0.27)

## 2016-04-27 LAB — MAGNESIUM: Magnesium: 1.8 mg/dL (ref 1.7–2.4)

## 2016-04-27 MED ORDER — SODIUM CHLORIDE 0.9 % IV SOLN
INTRAVENOUS | Status: DC
  Administered 2016-04-27: 4.2 [IU]/h via INTRAVENOUS
  Filled 2016-04-27: qty 2.5

## 2016-04-27 MED ORDER — SODIUM CHLORIDE 0.9 % IV SOLN
INTRAVENOUS | Status: DC
  Administered 2016-04-27: 3.4 [IU]/h via INTRAVENOUS

## 2016-04-27 MED ORDER — MAGIC MOUTHWASH
5.0000 mL | Freq: Four times a day (QID) | ORAL | Status: DC | PRN
  Filled 2016-04-27: qty 5

## 2016-04-27 MED ORDER — DEXTROSE-NACL 5-0.45 % IV SOLN
INTRAVENOUS | Status: DC
  Administered 2016-04-27 – 2016-04-28 (×3): via INTRAVENOUS

## 2016-04-27 MED ORDER — MOMETASONE FURO-FORMOTEROL FUM 200-5 MCG/ACT IN AERO
2.0000 | INHALATION_SPRAY | Freq: Two times a day (BID) | RESPIRATORY_TRACT | Status: DC
  Administered 2016-04-28 – 2016-04-29 (×2): 2 via RESPIRATORY_TRACT
  Filled 2016-04-27 (×3): qty 8.8

## 2016-04-27 MED ORDER — IPRATROPIUM-ALBUTEROL 0.5-2.5 (3) MG/3ML IN SOLN
3.0000 mL | Freq: Four times a day (QID) | RESPIRATORY_TRACT | Status: DC
  Administered 2016-04-27: 3 mL via RESPIRATORY_TRACT
  Filled 2016-04-27: qty 3

## 2016-04-27 MED ORDER — SODIUM CHLORIDE 0.9 % IV BOLUS (SEPSIS)
1000.0000 mL | Freq: Once | INTRAVENOUS | Status: AC
  Administered 2016-04-27: 1000 mL via INTRAVENOUS

## 2016-04-27 MED ORDER — POTASSIUM CHLORIDE 10 MEQ/100ML IV SOLN
10.0000 meq | INTRAVENOUS | Status: AC
  Administered 2016-04-27 (×6): 10 meq via INTRAVENOUS
  Filled 2016-04-27 (×6): qty 100

## 2016-04-27 MED ORDER — ALBUTEROL SULFATE HFA 108 (90 BASE) MCG/ACT IN AERS: 2.0000 | INHALATION_SPRAY | Freq: Four times a day (QID) | RESPIRATORY_TRACT | Status: DC | PRN

## 2016-04-27 MED ORDER — POTASSIUM CHLORIDE CRYS ER 20 MEQ PO TBCR
40.0000 meq | EXTENDED_RELEASE_TABLET | Freq: Once | ORAL | Status: AC
  Administered 2016-04-27: 40 meq via ORAL
  Filled 2016-04-27: qty 2

## 2016-04-27 MED ORDER — SODIUM CHLORIDE 0.9 % IV SOLN
INTRAVENOUS | Status: DC
  Administered 2016-04-27: 14:00:00 via INTRAVENOUS

## 2016-04-27 MED ORDER — CIPROFLOXACIN IN D5W 200 MG/100ML IV SOLN
200.0000 mg | Freq: Two times a day (BID) | INTRAVENOUS | Status: DC
  Administered 2016-04-27: 200 mg via INTRAVENOUS
  Filled 2016-04-27 (×3): qty 100

## 2016-04-27 MED ORDER — ALBUTEROL SULFATE (2.5 MG/3ML) 0.083% IN NEBU: 2.5000 mg | INHALATION_SOLUTION | Freq: Four times a day (QID) | RESPIRATORY_TRACT | Status: DC | PRN

## 2016-04-27 MED ORDER — CIPROFLOXACIN IN D5W 400 MG/200ML IV SOLN
400.0000 mg | Freq: Once | INTRAVENOUS | Status: AC
  Administered 2016-04-27: 400 mg via INTRAVENOUS
  Filled 2016-04-27: qty 200

## 2016-04-27 MED ORDER — HYDROCHLOROTHIAZIDE 25 MG PO TABS
25.0000 mg | ORAL_TABLET | Freq: Every day | ORAL | Status: DC
  Administered 2016-04-28 – 2016-04-30 (×3): 25 mg via ORAL
  Filled 2016-04-27 (×3): qty 1

## 2016-04-27 MED ORDER — SODIUM CHLORIDE 0.9 % IV SOLN
INTRAVENOUS | Status: DC
  Administered 2016-04-27: 16:00:00 via INTRAVENOUS

## 2016-04-27 MED ORDER — POTASSIUM CHLORIDE CRYS ER 20 MEQ PO TBCR
40.0000 meq | EXTENDED_RELEASE_TABLET | Freq: Once | ORAL | Status: DC
  Filled 2016-04-27: qty 2

## 2016-04-27 MED ORDER — DEXTROSE-NACL 5-0.45 % IV SOLN
INTRAVENOUS | Status: DC
  Administered 2016-04-27: 16:00:00 via INTRAVENOUS

## 2016-04-27 MED ORDER — SODIUM CHLORIDE 0.9 % IV SOLN
INTRAVENOUS | Status: DC
  Administered 2016-04-27 – 2016-04-29 (×2): via INTRAVENOUS

## 2016-04-27 MED ORDER — IPRATROPIUM-ALBUTEROL 18-103 MCG/ACT IN AERO: 2.0000 | INHALATION_SPRAY | Freq: Four times a day (QID) | RESPIRATORY_TRACT | Status: DC

## 2016-04-27 NOTE — ED Notes (Signed)
Pt here with hyperglycemia. sts that she has been weak and off balance the last week. Sent here by her doctor. given insulin PTA

## 2016-04-27 NOTE — ED Provider Notes (Signed)
CSN: [id number]     Arrival date & time 04/27/16  1048 History   First MD Initiated Contact with Patient 04/27/16 1207     Chief Complaint  Patient presents with  . Hyperglycemia     (Consider location/radiation/quality/duration/timing/severity/associated sxs/prior Treatment) HPI Comments: Pt with hx of diabetes comes in with cc of weakness, elevated blood sugar. Pt reports that since the last 2 weeks, she has been having some pain with swallowing and difficulty swallowing. She hasb een having less po consumption due to that. She has no emesis. She also reports being unsteady everytime she walks. No hx of strokes. Pt denies any focal numbness, tingling, weakness, vision complains. Pt also reports that her sugar has been running high today. She has been taking her meds as prescribed.   ROS 10 Systems reviewed and are negative for acute change except as noted in the HPI.     Patient is a 45 y.o. female presenting with hyperglycemia. The history is provided by the patient.  Hyperglycemia   Past Medical History  Diagnosis Date  . Diabetes mellitus   . Arthritis   . Asthma   . Hypertension   . Depression   . Anxiety   . Obesity    Past Surgical History  Procedure Laterality Date  . Cholecystectomy     Family History  Problem Relation Age of Onset  . Hypertension Mother   . Cancer Son    Social History  Substance Use Topics  . Smoking status: Never Smoker   . Smokeless tobacco: Never Used  . Alcohol Use: 0.6 oz/week    1 Cans of beer per week     Comment: 2/day   OB History    Gravida Para Term Preterm AB TAB SAB Ectopic Multiple Living   2 2 2  0 0 0 0 0 0 2     Review of Systems    Allergies  Penicillins and Shellfish allergy  Home Medications   Prior to Admission medications   Medication Sig Start Date End Date Taking? Authorizing Provider  albuterol (PROVENTIL HFA;VENTOLIN HFA) 108 (90 BASE) MCG/ACT inhaler Inhale 2 puffs into the lungs every 6 (six)  hours as needed for wheezing or shortness of breath.    Historical Provider, MD  albuterol (PROVENTIL) (2.5 MG/3ML) 0.083% nebulizer solution Take 2.5 mg by nebulization every 6 (six) hours as needed. For shortness of breath    Historical Provider, MD  albuterol-ipratropium (COMBIVENT) 18-103 MCG/ACT inhaler Inhale 2 puffs into the lungs 4 (four) times daily.    Historical Provider, MD  budesonide-formoterol (SYMBICORT) 160-4.5 MCG/ACT inhaler Inhale 2 puffs into the lungs 2 (two) times daily.    Historical Provider, MD  cetirizine (ZYRTEC) 10 MG tablet Take 10 mg by mouth daily.    Historical Provider, MD  glimepiride (AMARYL) 4 MG tablet Take 4 mg by mouth daily with breakfast.    Historical Provider, MD  hydrochlorothiazide (HYDRODIURIL) 25 MG tablet Take 25 mg by mouth daily.    Historical Provider, MD  hydrOXYzine (ATARAX/VISTARIL) 25 MG tablet Take 25 mg by mouth 3 (three) times daily as needed for itching.    Historical Provider, MD  metFORMIN (GLUCOPHAGE) 500 MG tablet Take 500 mg by mouth 2 (two) times daily with a meal.    Historical Provider, MD  Multiple Vitamin (MULTIVITAMIN WITH MINERALS) TABS tablet Take 1 tablet by mouth daily.    Historical Provider, MD  naproxen (NAPROSYN) 500 MG tablet Take 500 mg by mouth 2 (two) times daily  as needed for mild pain.     Historical Provider, MD   BP 114/83 mmHg  Pulse 98  Temp(Src) 98 F (36.7 C) (Oral)  Resp 18  Ht 5\' 7"  (1.702 m)  Wt 262 lb (118.842 kg)  BMI 41.03 kg/m2  SpO2 96%  LMP 04/30/2014 Physical Exam  Constitutional: She is oriented to person, place, and time. She appears well-developed.  HENT:  Head: Normocephalic and atraumatic.  Mouth/Throat: No oropharyngeal exudate.  No enlarged thyroid  Eyes: EOM are normal.  Neck: Normal range of motion. Neck supple. No JVD present.  Cardiovascular: Normal rate.   Pulmonary/Chest: Effort normal.  Abdominal: Bowel sounds are normal. She exhibits no distension. There is no  tenderness.  Neurological: She is alert and oriented to person, place, and time. No cranial nerve deficit. Coordination normal.  Cerebellar exam is normal (finger to nose) Sensory exam normal for bilateral upper and lower extremities - and patient is able to discriminate between sharp and dull. Motor exam is 4+/5   Skin: Skin is warm and dry.  Nursing note and vitals reviewed.   ED Course  .Critical Care Performed by: Varney Biles Authorized by: Varney Biles Total critical care time: 40 minutes Critical care time was exclusive of separately billable procedures and treating other patients. Critical care was necessary to treat or prevent imminent or life-threatening deterioration of the following conditions: metabolic crisis. Critical care was time spent personally by me on the following activities: blood draw for specimens, evaluation of patient's response to treatment, obtaining history from patient or surrogate, ordering and review of laboratory studies, development of treatment plan with patient or surrogate, discussions with primary provider, ordering and performing treatments and interventions and examination of patient.   (including critical care time) Labs Review Labs Reviewed  BASIC METABOLIC PANEL - Abnormal; Notable for the following:    Sodium 130 (*)    Potassium 2.7 (*)    Chloride 87 (*)    Glucose, Bld 479 (*)    Creatinine, Ser 1.41 (*)    GFR calc non Af Amer 44 (*)    GFR calc Af Amer 51 (*)    Anion gap 19 (*)    All other components within normal limits  CBC - Abnormal; Notable for the following:    RBC 5.55 (*)    MCV 74.6 (*)    MCH 24.1 (*)    RDW 16.3 (*)    Platelets 499 (*)    All other components within normal limits  URINALYSIS, ROUTINE W REFLEX MICROSCOPIC (NOT AT [facility]) - Abnormal; Notable for the following:    Color, Urine AMBER (*)    APPearance TURBID (*)    Glucose, UA >1000 (*)    Hgb urine dipstick MODERATE (*)    Bilirubin Urine  LARGE (*)    Ketones, ur 15 (*)    Protein, ur 100 (*)    Leukocytes, UA LARGE (*)    All other components within normal limits  URINE MICROSCOPIC-ADD ON - Abnormal; Notable for the following:    Squamous Epithelial / LPF 0-5 (*)    Bacteria, UA MANY (*)    All other components within normal limits  BETA-HYDROXYBUTYRIC ACID - Abnormal; Notable for the following:    Beta-Hydroxybutyric Acid 2.13 (*)    All other components within normal limits  CBG MONITORING, ED - Abnormal; Notable for the following:    Glucose-Capillary 501 (*)    All other components within normal limits  MAGNESIUM  CBG MONITORING,  ED  CBG MONITORING, ED  CBG MONITORING, ED    Imaging Review No results found. I have personally reviewed and evaluated these images and lab results as part of my medical decision-making.   EKG Interpretation None      MDM   Final diagnoses:  Type 2 diabetes mellitus with ketoacidosis without coma, unspecified long term insulin use status (HCC)  UTI (lower urinary tract infection)  Hypokalemia    Pt comes in with cc of elevated blood sugar. She also c/o slurred speech, trouble swallowing and abnormal balance.  - DKA - Stroke -Esophagitis - UTI - Dehydration -Hypokalemia  She has a non focal neuro exam.  ? Stroke. CT head ordered.  There is no trismus, crepitus of the neck. Our exam reveals no signs of infection in the posterior pharynx. No lymphadenopathy. No indication for CT/Xrays - will need swallow evaluation. Esophagitis is a possible dx as well.  DKA treatment will be started here. AG is 19. UA is + - so we will start meds for that, as it might be the underlying cause of the DKA.    Varney Biles, MD 04/27/16 1345

## 2016-04-27 NOTE — H&P (Signed)
History and Physical    MARINELLE HODGMAN [id number] DOB: 13-Aug-1971 DOA: 04/27/2016  Referring MD/NP/PA: EDP PCP: PROVIDER NOT IN SYSTEM  Outpatient Specialists: Patient Care Team: Provider Not In System as PCP - General  Patient coming from:  Home   Chief Complaint: Hyperglycemia  HPI: Latoya Roth is a 45 y.o. female with medical history significant for HTN, Asthma, DM2 non insulin dependent, presnting to the ED in increasing generalized weakness. Over the last 2 weeks, she reported decreased oral intake, due to increased odynophagia without dysphagia or dysphasia, accompanied by increased gait instability.  Denies any chest pain, or shortness of breath. Denies any fever or chills, or night sweats. Had some dizziness, with acute onset of nausea   Does not smoke, drink or partake recreational drugs. No new meds or hormonal supplements. Denies any recent long distance trips.   ED Course:  BP 110/67 mmHg  Pulse 94  Temp(Src) 98.1 F (36.7 C) (Oral)  Resp 16  Ht 5\' 7"  (1.702 m)  Wt 118.842 kg (262 lb)  BMI 41.03 kg/m2  SpO2 96%  LMP 04/30/2014     Review of Systems: As per HPI otherwise 10 point review of systems negative.   Past Medical History  Diagnosis Date  . Diabetes mellitus   . Arthritis   . Asthma   . Hypertension   . Depression   . Anxiety   . Obesity     Past Surgical History  Procedure Laterality Date  . Cholecystectomy       reports that she has never smoked. She has never used smokeless tobacco. She reports that she drinks about 0.6 oz of alcohol per week. She reports that she does not use illicit drugs.  Allergies  Allergen Reactions  . Penicillins Anaphylaxis  . Shellfish Allergy Anaphylaxis, Hives and Swelling    Family History  Problem Relation Age of Onset  . Hypertension Mother   . Cancer Son     Family history reviewed and not pertinent (If you reviewed it)  Prior to Admission medications   Medication Sig Start Date End  Date Taking? Authorizing Provider  albuterol (PROVENTIL HFA;VENTOLIN HFA) 108 (90 BASE) MCG/ACT inhaler Inhale 2 puffs into the lungs every 6 (six) hours as needed for wheezing or shortness of breath.    Historical Provider, MD  albuterol (PROVENTIL) (2.5 MG/3ML) 0.083% nebulizer solution Take 2.5 mg by nebulization every 6 (six) hours as needed. For shortness of breath    Historical Provider, MD  albuterol-ipratropium (COMBIVENT) 18-103 MCG/ACT inhaler Inhale 2 puffs into the lungs 4 (four) times daily. Reported on 04/27/2016    Historical Provider, MD  budesonide-formoterol (SYMBICORT) 160-4.5 MCG/ACT inhaler Inhale 2 puffs into the lungs 2 (two) times daily.    Historical Provider, MD  cetirizine (ZYRTEC) 10 MG tablet Take 10 mg by mouth daily.    Historical Provider, MD  glimepiride (AMARYL) 4 MG tablet Take 4 mg by mouth daily with breakfast.    Historical Provider, MD  hydrochlorothiazide (HYDRODIURIL) 25 MG tablet Take 25 mg by mouth daily.    Historical Provider, MD  hydrOXYzine (ATARAX/VISTARIL) 25 MG tablet Take 25 mg by mouth 3 (three) times daily as needed for itching.    Historical Provider, MD  metFORMIN (GLUCOPHAGE) 500 MG tablet Take 500 mg by mouth 2 (two) times daily with a meal.    Historical Provider, MD  Multiple Vitamin (MULTIVITAMIN WITH MINERALS) TABS tablet Take 1 tablet by mouth daily.    Historical Provider, MD  naproxen (NAPROSYN) 500 MG tablet Take 500 mg by mouth 2 (two) times daily as needed for mild pain.     Historical Provider, MD    Physical Exam:    Filed Vitals:   04/27/16 1100 04/27/16 1317 04/27/16 1423 04/27/16 1458  BP: 101/76 114/83  110/67  Pulse: 107 98  94  Temp: 98 F (36.7 C)  98.1 F (36.7 C)   TempSrc: Oral     Resp: 18   16  Height: 5\' 7"  (1.702 m)     Weight: 118.842 kg (262 lb)     SpO2: 98% 96%  96%      Constitutional: NAD, calm, comfortable Filed Vitals:   04/27/16 1100 04/27/16 1317 04/27/16 1423 04/27/16 1458  BP: 101/76  114/83  110/67  Pulse: 107 98  94  Temp: 98 F (36.7 C)  98.1 F (36.7 C)   TempSrc: Oral     Resp: 18   16  Height: 5\' 7"  (1.702 m)     Weight: 118.842 kg (262 lb)     SpO2: 98% 96%  96%   Eyes: PERRL, lids and conjunctivae normal ENMT: Mucous membranes are moist. Posterior pharynx clear of any exudate or lesions.Normal dentition.  Neck: normal, supple, no masses, no thyromegaly Respiratory: clear to auscultation bilaterally, no wheezing, no crackles. Normal respiratory effort. No accessory muscle use.  Cardiovascular: Regular rate and rhythm, no murmurs / rubs / gallops. No extremity edema. 2+ pedal pulses. No carotid bruits.  Abdomen: no tenderness, no masses palpated. No hepatosplenomegaly. Bowel sounds positive.  Musculoskeletal: no clubbing / cyanosis. No joint deformity upper and lower extremities. Good ROM, no contractures. Normal muscle tone.  Skin: no rashes, lesions, ulcers. No induration Neurologic: CN 2-12 grossly intact. Sensation intact, DTR normal. Strength 5/5 in all 4.  Psychiatric: Normal judgment and insight. Alert and oriented x 3. Normal mood.     Labs on Admission: I have personally reviewed following labs and imaging studies  CBC:  Recent Labs Lab 04/27/16 1122  WBC 8.7  HGB 13.4  HCT 41.4  MCV 74.6*  PLT 499*    Basic Metabolic Panel:  Recent Labs Lab 04/27/16 1122 04/27/16 1242  NA 130*  --   K 2.7*  --   CL 87*  --   CO2 24  --   GLUCOSE 479*  --   BUN 13  --   CREATININE 1.41*  --   CALCIUM 9.1  --   MG  --  1.8    GFR: Estimated Creatinine Clearance: 66.5 mL/min (by C-G formula based on Cr of 1.41).  Liver Function Tests: No results for input(s): AST, ALT, ALKPHOS, BILITOT, PROT, ALBUMIN in the last 168 hours. No results for input(s): LIPASE, AMYLASE in the last 168 hours. No results for input(s): AMMONIA in the last 168 hours.  Coagulation Profile: No results for input(s): INR, PROTIME in the last 168 hours.  Cardiac  Enzymes: No results for input(s): CKTOTAL, CKMB, CKMBINDEX, TROPONINI in the last 168 hours.  BNP (last 3 results) No results for input(s): PROBNP in the last 8760 hours.  HbA1C: No results for input(s): HGBA1C in the last 72 hours.  CBG:  Recent Labs Lab 04/27/16 1100 04/27/16 1419  GLUCAP 501* 229*    Lipid Profile: No results for input(s): CHOL, HDL, LDLCALC, TRIG, CHOLHDL, LDLDIRECT in the last 72 hours.  Thyroid Function Tests: No results for input(s): TSH, T4TOTAL, FREET4, T3FREE, THYROIDAB in the last 72 hours.  Anemia Panel: No  results for input(s): VITAMINB12, FOLATE, FERRITIN, TIBC, IRON, RETICCTPCT in the last 72 hours.  Urine analysis:    Component Value Date/Time   COLORURINE AMBER* 04/27/2016 1124   APPEARANCEUR TURBID* 04/27/2016 1124   LABSPEC 1.026 04/27/2016 1124   PHURINE 6.0 04/27/2016 1124   GLUCOSEU >1000* 04/27/2016 1124   HGBUR MODERATE* 04/27/2016 1124   BILIRUBINUR LARGE* 04/27/2016 1124   BILIRUBINUR neg 01/15/2014 0933   KETONESUR 15* 04/27/2016 1124   PROTEINUR 100* 04/27/2016 1124   PROTEINUR neg 01/15/2014 0933   UROBILINOGEN 1.0 03/13/2014 0902   UROBILINOGEN negative 01/15/2014 0933   NITRITE NEGATIVE 04/27/2016 1124   NITRITE neg 01/15/2014 0933   LEUKOCYTESUR LARGE* 04/27/2016 1124    Sepsis Labs: @LABRCNTIP (procalcitonin:4,lacticidven:4) )No results found for this or any previous visit (from the past 240 hour(s)).   Radiological Exams on Admission: Ct Head Wo Contrast  04/27/2016  CLINICAL DATA:  Hyperglycemia.  Weakness.  Off balance. EXAM: CT HEAD WITHOUT CONTRAST TECHNIQUE: Contiguous axial images were obtained from the base of the skull through the vertex without intravenous contrast. COMPARISON:  10/28/2012 FINDINGS: Examination is degraded due to patient motion artifact Brain: Gray-white differentiation is maintained. No CT evidence of acute large territory infarct. No intraparenchymal or extra-axial mass or hemorrhage.  Normal size and configuration of the ventricles and basilar cisterns. No midline shift. Vascular: No hyperdense vessel or unexpected calcification. Skull: Negative for fracture or focal lesion. Sinuses/Orbits: There is underpneumatization the bilateral frontal sinuses. The remaining paranasal sinuses and mastoid air cells are normal. No air-fluid levels. Other: Regional soft tissues appear normal. IMPRESSION: Degraded examination without acute intracranial process. Electronically Signed   By: Sandi Mariscal M.D.   On: 04/27/2016 13:54    EKG: Independently reviewed.  Assessment/Plan Active Problems:   DKA (diabetic ketoacidoses) (HCC)   Hypertension   Generalized weakness   Odynophagia   Oral candidiasis   Asthma    Diabetic Ketoacidosis unknown etiology. She is on oral agents as outpatient and on inhaled steroids.  Admission Glucose was 501 479, Anion Gap was 19, Lactic Acid 3.82,  K 2.7  S/p 1 L NS, then 125 cc/h, Insulin drip initiated . She also received Kdur 40 meq oral x1 Admit for SDU Aggressive hydration at 125 cc/h with 20 kcl Check BMET q 4 hrs  Potassium IV 6 runs  DKA protocol    Odynophagia without dysphagia,  in the setting of DM and Asthma on inhalers; Candidiasis noted in posterior pharynx   Magic mouthwash 5 ml q 4 hrs  UTI  WBC 8.7, UA with large leuko -  F/u urine culture Start Cipro    History of Asthma, not currently on exacerbation. Afebrile. WBC 8.7  Continue home inhalers  O2 - CBC in am Incentive spirometry   Hypertension BP 110/67 mmHg  Pulse 94  Temp(Src) 98.1 F (36.7 C) (Oral)  Resp 16  Ht 5\' 7"  (1.702 m)  Wt 118.842 kg (262 lb)  BMI 41.03 kg/m2  SpO2 96%  LMP 04/30/2014 Controlled Continue home anti-hypertensive medications.  Add Hydralazine Q6 hours as needed for SBP >160 and /or DBP >110.   Acute Kidney Injury due to Volume depletion Cr 1.41 IV Fluids as above   Strict I/O and daily weights  Follow Cr in am.     Deconditioning/Generalized weakness due to decreased oral intake over the last 1 weeks due to odynophagia  Will consider OT/PT if symptoms not improving   DVT prophylaxis: Heparin Code Status:   Full  Family Communication:  Discussed with  husband  Disposition Plan: Expect patient to be discharged to home Consults called:    None Admission status:SDU    Rondel Jumbo, PA-C [facility]   If 7PM-7AM, please contact night-coverage [email or web address] Password [email or web address]  04/27/2016, 3:20 PM

## 2016-04-28 ENCOUNTER — Inpatient Hospital Stay (HOSPITAL_COMMUNITY): Payer: Medicaid Other

## 2016-04-28 DIAGNOSIS — N179 Acute kidney failure, unspecified: Secondary | ICD-10-CM | POA: Diagnosis present

## 2016-04-28 DIAGNOSIS — N390 Urinary tract infection, site not specified: Secondary | ICD-10-CM | POA: Diagnosis present

## 2016-04-28 DIAGNOSIS — J4520 Mild intermittent asthma, uncomplicated: Secondary | ICD-10-CM

## 2016-04-28 DIAGNOSIS — I10 Essential (primary) hypertension: Secondary | ICD-10-CM

## 2016-04-28 DIAGNOSIS — R1310 Dysphagia, unspecified: Secondary | ICD-10-CM

## 2016-04-28 DIAGNOSIS — B370 Candidal stomatitis: Secondary | ICD-10-CM

## 2016-04-28 LAB — BASIC METABOLIC PANEL
ANION GAP: 12 (ref 5–15)
ANION GAP: 14 (ref 5–15)
BUN: 8 mg/dL (ref 6–20)
BUN: 6 mg/dL (ref 6–20)
CALCIUM: 8.3 mg/dL — AB (ref 8.9–10.3)
CHLORIDE: 95 mmol/L — AB (ref 101–111)
CO2: 28 mmol/L (ref 22–32)
CO2: 28 mmol/L (ref 22–32)
Calcium: 8.2 mg/dL — ABNORMAL LOW (ref 8.9–10.3)
Chloride: 95 mmol/L — ABNORMAL LOW (ref 101–111)
Creatinine, Ser: 0.88 mg/dL (ref 0.44–1.00)
Creatinine, Ser: 0.81 mg/dL (ref 0.44–1.00)
GFR calc non Af Amer: >60 mL/min (ref >60)
GLUCOSE: 154 mg/dL — AB (ref 65–99)
Glucose, Bld: 144 mg/dL — ABNORMAL HIGH (ref 65–99)
POTASSIUM: 2.8 mmol/L — AB (ref 3.5–5.1)
Potassium: 3.2 mmol/L — ABNORMAL LOW (ref 3.5–5.1)
SODIUM: 135 mmol/L (ref 135–145)
SODIUM: 137 mmol/L (ref 135–145)

## 2016-04-28 LAB — GLUCOSE, CAPILLARY
GLUCOSE-CAPILLARY: 153 mg/dL — AB (ref 65–99)
GLUCOSE-CAPILLARY: 164 mg/dL — AB (ref 65–99)
GLUCOSE-CAPILLARY: 204 mg/dL — AB (ref 65–99)
GLUCOSE-CAPILLARY: 162 mg/dL — AB (ref 65–99)
GLUCOSE-CAPILLARY: 160 mg/dL — AB (ref 65–99)
Glucose-Capillary: 155 mg/dL — ABNORMAL HIGH (ref 65–99)
Glucose-Capillary: 159 mg/dL — ABNORMAL HIGH (ref 65–99)
Glucose-Capillary: 165 mg/dL — ABNORMAL HIGH (ref 65–99)
Glucose-Capillary: 173 mg/dL — ABNORMAL HIGH (ref 65–99)

## 2016-04-28 LAB — MAGNESIUM: Magnesium: 1.7 mg/dL (ref 1.7–2.4)

## 2016-04-28 LAB — MRSA PCR SCREENING: MRSA BY PCR: NEGATIVE

## 2016-04-28 LAB — TROPONIN I
Troponin I: 0.03 ng/mL (ref <0.031)
Troponin I: <0.03 ng/mL (ref <0.031)

## 2016-04-28 MED ORDER — INSULIN ASPART 100 UNIT/ML ~~LOC~~ SOLN: 0.0000 [IU] | Freq: Every day | SUBCUTANEOUS | Status: DC

## 2016-04-28 MED ORDER — FLUCONAZOLE 100 MG PO TABS
100.0000 mg | ORAL_TABLET | Freq: Every day | ORAL | Status: DC
  Administered 2016-04-29 – 2016-04-30 (×2): 100 mg via ORAL
  Filled 2016-04-28 (×3): qty 1

## 2016-04-28 MED ORDER — FLUCONAZOLE IN SODIUM CHLORIDE 200-0.9 MG/100ML-% IV SOLN
200.0000 mg | Freq: Once | INTRAVENOUS | Status: DC
  Administered 2016-04-28: 200 mg via INTRAVENOUS
  Filled 2016-04-28: qty 100

## 2016-04-28 MED ORDER — CIPROFLOXACIN IN D5W 400 MG/200ML IV SOLN: 400.0000 mg | Freq: Two times a day (BID) | INTRAVENOUS | Status: DC

## 2016-04-28 MED ORDER — INSULIN STARTER KIT- PEN NEEDLES (ENGLISH)
1.0000 | Freq: Once | Status: DC
  Filled 2016-04-28: qty 1

## 2016-04-28 MED ORDER — MAGNESIUM SULFATE 50 % IJ SOLN
3.0000 g | Freq: Once | INTRAVENOUS | Status: AC
  Administered 2016-04-28: 3 g via INTRAVENOUS
  Filled 2016-04-28 (×2): qty 6

## 2016-04-28 MED ORDER — INSULIN ASPART 100 UNIT/ML ~~LOC~~ SOLN
0.0000 [IU] | Freq: Three times a day (TID) | SUBCUTANEOUS | Status: DC
  Administered 2016-04-28: 2 [IU] via SUBCUTANEOUS

## 2016-04-28 MED ORDER — IPRATROPIUM-ALBUTEROL 0.5-2.5 (3) MG/3ML IN SOLN: 3.0000 mL | Freq: Four times a day (QID) | RESPIRATORY_TRACT | Status: DC | PRN

## 2016-04-28 MED ORDER — ADULT MULTIVITAMIN W/MINERALS CH
1.0000 | ORAL_TABLET | Freq: Every day | ORAL | Status: DC
  Administered 2016-04-28 – 2016-04-30 (×3): 1 via ORAL
  Filled 2016-04-28 (×3): qty 1

## 2016-04-28 MED ORDER — INSULIN GLARGINE 100 UNIT/ML ~~LOC~~ SOLN
10.0000 [IU] | Freq: Every day | SUBCUTANEOUS | Status: DC
  Administered 2016-04-28: 10 [IU] via SUBCUTANEOUS
  Filled 2016-04-28 (×2): qty 0.1

## 2016-04-28 MED ORDER — POTASSIUM CHLORIDE CRYS ER 20 MEQ PO TBCR
40.0000 meq | EXTENDED_RELEASE_TABLET | ORAL | Status: DC
  Administered 2016-04-28: 40 meq via ORAL
  Filled 2016-04-28: qty 2

## 2016-04-28 MED ORDER — LIVING WELL WITH DIABETES BOOK
Freq: Once | Status: AC
  Administered 2016-04-28: 10:00:00
  Filled 2016-04-28: qty 1

## 2016-04-28 MED ORDER — LORATADINE 10 MG PO TABS
10.0000 mg | ORAL_TABLET | Freq: Every day | ORAL | Status: DC
  Administered 2016-04-28 – 2016-04-30 (×3): 10 mg via ORAL
  Filled 2016-04-28 (×3): qty 1

## 2016-04-28 MED ORDER — POTASSIUM CHLORIDE CRYS ER 20 MEQ PO TBCR
40.0000 meq | EXTENDED_RELEASE_TABLET | ORAL | Status: AC
  Administered 2016-04-28 (×3): 40 meq via ORAL
  Filled 2016-04-28 (×3): qty 2

## 2016-04-28 MED ORDER — FLUCONAZOLE 100 MG PO TABS: 100.0000 mg | ORAL_TABLET | Freq: Every day | ORAL | Status: DC

## 2016-04-28 MED ORDER — HYDROXYZINE HCL 25 MG PO TABS: 25.0000 mg | ORAL_TABLET | Freq: Three times a day (TID) | ORAL | Status: DC | PRN

## 2016-04-28 MED ORDER — CIPROFLOXACIN HCL 500 MG PO TABS
500.0000 mg | ORAL_TABLET | Freq: Two times a day (BID) | ORAL | Status: DC
  Administered 2016-04-28 – 2016-04-30 (×5): 500 mg via ORAL
  Filled 2016-04-28 (×6): qty 1

## 2016-04-28 MED ORDER — INSULIN ASPART 100 UNIT/ML ~~LOC~~ SOLN
0.0000 [IU] | Freq: Three times a day (TID) | SUBCUTANEOUS | Status: DC
  Administered 2016-04-28: 5 [IU] via SUBCUTANEOUS
  Administered 2016-04-28 – 2016-04-29 (×3): 3 [IU] via SUBCUTANEOUS
  Administered 2016-04-29: 5 [IU] via SUBCUTANEOUS
  Administered 2016-04-30: 2 [IU] via SUBCUTANEOUS
  Administered 2016-04-30 (×2): 3 [IU] via SUBCUTANEOUS

## 2016-04-28 NOTE — Progress Notes (Addendum)
ANTIBIOTIC CONSULT NOTE  Pharmacy Consult for Cipro Indication: UTI  Allergies  Allergen Reactions  . Penicillins Anaphylaxis  . Shellfish Allergy Anaphylaxis, Hives and Swelling   Patient Measurements: Height: 5\' 7"  (170.2 cm) Weight: 239 lb (108.41 kg) IBW/kg (Calculated) : 61.6  Labs:  Recent Labs  04/27/16 1122 04/27/16 1543 04/27/16 2323 04/28/16 0224  WBC 8.7  --   --   --   HGB 13.4  --   --   --   PLT 499*  --   --   --   CREATININE 1.41* 0.99 0.95 0.88   Estimated Creatinine Clearance: 101.3 mL/min (by C-G formula based on Cr of 0.88).  Microbiology: Recent Results (from the past 720 hour(s))  MRSA PCR Screening     Status: None   Collection Time: 04/27/16  7:56 PM  Result Value Ref Range Status   MRSA by PCR NEGATIVE NEGATIVE Final    Comment:        The GeneXpert MRSA Assay (FDA approved for NASAL specimens only), is one component of a comprehensive MRSA colonization surveillance program. It is not intended to diagnose MRSA infection nor to guide or monitor treatment for MRSA infections.    Medical History: Past Medical History  Diagnosis Date  . Diabetes mellitus   . Arthritis   . Asthma   . Hypertension   . Depression   . Anxiety   . Obesity    Medications:  Anti-infectives    Start     Dose/Rate Route Frequency Ordered Stop   04/27/16 2200  ciprofloxacin (CIPRO) IVPB 200 mg     200 mg 100 mL/hr over 60 Minutes Intravenous Every 12 hours 04/27/16 1557     04/27/16 1315  ciprofloxacin (CIPRO) IVPB 400 mg     400 mg 200 mL/hr over 60 Minutes Intravenous  Once 04/27/16 1301 04/27/16 1712     Assessment: 45yo patient started on IV Cipro for empiric therapy to cover UTI.  She received one dose last night without noted complications.  Her Scr is 0.88 and her estimated crcl is ~ 144ml/min.  She has a noted allergy to penicillins.  No micro - data available. She is afebrile and WBC WNL last evening.    Goal of Therapy:  Therapeutic  response to IV antibiotics  Plan:  IV Cipro as ordered - would aim for 3-5 days of therapy, consider changing to oral therapy in next 24 hours. Pharmacy will sign off, please let us know if we can assist further in the care of this patient.  Rober Minion, PharmD., MS Clinical Pharmacist Pager:  [phone] Thank you for allowing pharmacy to be part of this patients care team. 04/28/2016,8:01 AM

## 2016-04-28 NOTE — Progress Notes (Signed)
Inpatient Diabetes Program Recommendations  AACE/ADA: New Consensus Statement on Inpatient Glycemic Control (2015)  Target Ranges:  Prepandial:   less than 140 mg/dL      Peak postprandial:   less than 180 mg/dL (1-2 hours)      Critically ill patients:  140 - 180 mg/dL   Review of Glycemic ControlResults for LYLIAN, SANAGUSTIN (MRN [id number]) as of 04/28/2016 09:49  Ref. Range 04/27/2016 11:00  Glucose-Capillary Latest Ref Range: 65-99 mg/dL 501 (H)   Diabetes history: Type 2 diabetes- A1C pending Outpatient Diabetes medications:  Amaryl 4 mg with breakfast, Metformin 500 mg bid Current orders for Inpatient glycemic control:  Lantus 10 units q HS, Novolog moderate tid with meals and HS  Inpatient Diabetes Program Recommendations:    Consider increasing Lantus to 20 units daily (0.2 units/kg).  Will order "Living Well with Diabetes Booklet" for patient and insulin starter kit for bedside RN to review with patient.    Thanks, Adah Perl, RN, BC-ADM Inpatient Diabetes Coordinator Pager [phone] (8a-5p)

## 2016-04-28 NOTE — Progress Notes (Signed)
PROGRESS NOTE    Latoya Roth  WEX:[id number] DOB: [birth year] DOA: 04/27/2016 PCP: Benito Mccreedy, MD  Outpatient Specialists:    Assessment & Plan:   Principal Problem:   DKA (diabetic ketoacidoses) ([place]) Active Problems:   Hypokalemia   BMI 40.0-44.9, adult ([place])   Hypertension   Generalized weakness   Odynophagia   Oral candidiasis   Asthma   AKI (acute kidney injury) ([place])  #1 DKA versus hyperosmolar hyperglycemic state Questionable etiology. Likely secondary to UTI as urinalysis was consistent with a UTI. Check urine cultures. Check a EKG. Check a chest x-ray. Cycle cardiac enzymes every 6 hours 3. Check a hemoglobin A1c. Patient was on the glucose stabilizer however have subsequently transitioned to subcutaneous insulin Lantus 10 units daily with sliding scale insulin. Patient has been placed on a carb modified diet. Patient states on the on oral hypoglycemic agents however states she is a type I diabetic. Patient noted on admission to have elevated beta hydroxy butyric acid with a anion gap of 19 and the blood glucose level of 479. Patient however was not acidotic on admission. Anion gap closed. Decrease IV fluids. Also diabetic coordinator for diabetic education. Follow.  #2 hypokalemia Likely secondary to problem #1. Check a magnesium level. Replete.  #3 Odynophagia secondary to oral candidiasis Patient noted to have oral candidiasis per admitting physician and patient had presented with odynophagia. Clinical improvement. Place on Diflucan. Continue Magic mouthwash. Follow.  #4 urinary tract infection Check urine cultures. Change IV ciprofloxacin to oral ciprofloxacin.  #5 asthma Stable. Continue Dulera.  #6 hypertension Stable. Continue HCTZ.  #7 acute kidney injury Likely secondary to dehydration. Resolved with IV fluids. Follow.   DVT prophylaxis: SCDs Code Status: Full Family Communication: Updated patient. No family at bedside. Disposition Plan:  Transfer to telemetry.   Consultants:   None  Procedures:   Chest x-ray pending 04/28/2016  Antimicrobials:   IV ciprofloxacin 04/27/2016>>>> oral ciprofloxacin 04/28/2016   Subjective: Patient states she's feeling better. Patient states odynophagia has improved. No chest pain. No shortness of breath. Tolerating current diet.  Objective: Filed Vitals:   04/28/16 0200 04/28/16 0402 04/28/16 0600 04/28/16 0833  BP: 121/79 125/80 119/81   Pulse: 91 84 81   Temp:  98.2 F (36.8 C)  97.8 F (36.6 C)  TempSrc:  Oral  Oral  Resp: '23 23 16   ' Height:      Weight:      SpO2: 93% 95% 97% 99%    Intake/Output Summary (Last 24 hours) at 04/28/16 1002 Last data filed at 04/28/16 0554  Gross per 24 hour  Intake 1442.08 ml  Output    450 ml  Net 992.08 ml   Filed Weights   04/27/16 1100 04/28/16 0000  Weight: 118.842 kg (262 lb) 108.41 kg (239 lb)    Examination:  General exam: Appears calm and comfortable.  Respiratory system: Clear to auscultation. Respiratory effort normal. Cardiovascular system: S1 & S2 heard, RRR. No JVD, murmurs, rubs, gallops or clicks. No pedal edema. Gastrointestinal system: Abdomen is nondistended, soft and nontender. No organomegaly or masses felt. Normal bowel sounds heard. Central nervous system: Alert and oriented. No focal neurological deficits. Extremities: Symmetric 5 x 5 power. Skin: No rashes, lesions or ulcers Psychiatry: Judgement and insight appear normal. Mood & affect appropriate.     Data Reviewed: I have personally reviewed following labs and imaging studies  CBC:  Recent Labs Lab 04/27/16 1122  WBC 8.7  HGB 13.4  HCT 41.4  MCV  74.6*  PLT 701*   Basic Metabolic Panel:  Recent Labs Lab 04/27/16 1122 04/27/16 1242 04/27/16 1543 04/27/16 2323 04/28/16 0224 04/28/16 0652  NA 130*  --  135 135 135 137  K 2.7*  --  2.8* 3.3* 3.2* 2.8*  CL 87*  --  92* 94* 95* 95*  CO2 24  --  '27 26 28 28  ' GLUCOSE 479*  --  220*  132* 144* 154*  BUN 13  --  '12 11 8 6  ' CREATININE 1.41*  --  0.99 0.95 0.88 0.81  CALCIUM 9.1  --  8.7* 8.4* 8.3* 8.2*  MG  --  1.8  --   --   --   --    GFR: Estimated Creatinine Clearance: 110 mL/min (by C-G formula based on Cr of 0.81). Liver Function Tests: No results for input(s): AST, ALT, ALKPHOS, BILITOT, PROT, ALBUMIN in the last 168 hours. No results for input(s): LIPASE, AMYLASE in the last 168 hours. No results for input(s): AMMONIA in the last 168 hours. Coagulation Profile: No results for input(s): INR, PROTIME in the last 168 hours. Cardiac Enzymes: No results for input(s): CKTOTAL, CKMB, CKMBINDEX, TROPONINI in the last 168 hours. BNP (last 3 results) No results for input(s): PROBNP in the last 8760 hours. HbA1C: No results for input(s): HGBA1C in the last 72 hours. CBG:  Recent Labs Lab 04/28/16 0137 04/28/16 0241 04/28/16 0400 04/28/16 0502 04/28/16 0804  GLUCAP 153* 164* 165* 155* 173*   Lipid Profile: No results for input(s): CHOL, HDL, LDLCALC, TRIG, CHOLHDL, LDLDIRECT in the last 72 hours. Thyroid Function Tests: No results for input(s): TSH, T4TOTAL, FREET4, T3FREE, THYROIDAB in the last 72 hours. Anemia Panel: No results for input(s): VITAMINB12, FOLATE, FERRITIN, TIBC, IRON, RETICCTPCT in the last 72 hours. Urine analysis:    Component Value Date/Time   COLORURINE AMBER* 04/27/2016 1124   APPEARANCEUR TURBID* 04/27/2016 1124   LABSPEC 1.026 04/27/2016 1124   PHURINE 6.0 04/27/2016 1124   GLUCOSEU >1000* 04/27/2016 1124   HGBUR MODERATE* 04/27/2016 1124   BILIRUBINUR LARGE* 04/27/2016 1124   BILIRUBINUR neg 01/15/2014 0933   KETONESUR 15* 04/27/2016 1124   PROTEINUR 100* 04/27/2016 1124   PROTEINUR neg 01/15/2014 0933   UROBILINOGEN 1.0 03/13/2014 0902   UROBILINOGEN negative 01/15/2014 0933   NITRITE NEGATIVE 04/27/2016 1124   NITRITE neg 01/15/2014 0933   LEUKOCYTESUR LARGE* 04/27/2016 1124   Sepsis  Labs: '@LABRCNTIP' (procalcitonin:4,lacticidven:4)  ) Recent Results (from the past 240 hour(s))  MRSA PCR Screening     Status: None   Collection Time: 04/27/16  7:56 PM  Result Value Ref Range Status   MRSA by PCR NEGATIVE NEGATIVE Final    Comment:        The GeneXpert MRSA Assay (FDA approved for NASAL specimens only), is one component of a comprehensive MRSA colonization surveillance program. It is not intended to diagnose MRSA infection nor to guide or monitor treatment for MRSA infections.          Radiology Studies: Ct Head Wo Contrast  04/27/2016  CLINICAL DATA:  Hyperglycemia.  Weakness.  Off balance. EXAM: CT HEAD WITHOUT CONTRAST TECHNIQUE: Contiguous axial images were obtained from the base of the skull through the vertex without intravenous contrast. COMPARISON:  10/28/2012 FINDINGS: Examination is degraded due to patient motion artifact Brain: Gray-white differentiation is maintained. No CT evidence of acute large territory infarct. No intraparenchymal or extra-axial mass or hemorrhage. Normal size and configuration of the ventricles and basilar cisterns. No midline  shift. Vascular: No hyperdense vessel or unexpected calcification. Skull: Negative for fracture or focal lesion. Sinuses/Orbits: There is underpneumatization the bilateral frontal sinuses. The remaining paranasal sinuses and mastoid air cells are normal. No air-fluid levels. Other: Regional soft tissues appear normal. IMPRESSION: Degraded examination without acute intracranial process. Electronically Signed   By: Sandi Mariscal M.D.   On: 04/27/2016 13:54        Scheduled Meds: . ciprofloxacin  400 mg Intravenous Q12H  . fluconazole (DIFLUCAN) IV  200 mg Intravenous Once  . [START ON 04/29/2016] fluconazole  100 mg Oral Daily  . hydrochlorothiazide  25 mg Oral Daily  . insulin aspart  0-15 Units Subcutaneous TID WC  . insulin aspart  0-5 Units Subcutaneous QHS  . insulin glargine  10 Units Subcutaneous QHS   . insulin starter kit- pen needles  1 kit Other Once  . living well with diabetes book   Does not apply Once  . mometasone-formoterol  2 puff Inhalation BID  . potassium chloride  40 mEq Oral Once  . potassium chloride  40 mEq Oral Q4H   Continuous Infusions: . sodium chloride Stopped (04/27/16 1957)     LOS: 1 day    Time spent: 72 minutes    Merwyn Hodapp, MD [facility] Pager [phone]  If 7PM-7AM, please contact night-coverage [email or web address] Password [email or web address] 04/28/2016, 10:02 AM

## 2016-04-29 LAB — GLUCOSE, CAPILLARY
GLUCOSE-CAPILLARY: 228 mg/dL — AB (ref 65–99)
GLUCOSE-CAPILLARY: 227 mg/dL — AB (ref 65–99)
GLUCOSE-CAPILLARY: 177 mg/dL — AB (ref 65–99)
GLUCOSE-CAPILLARY: 216 mg/dL — AB (ref 65–99)
GLUCOSE-CAPILLARY: 167 mg/dL — AB (ref 65–99)
Glucose-Capillary: 214 mg/dL — ABNORMAL HIGH (ref 65–99)
Glucose-Capillary: 186 mg/dL — ABNORMAL HIGH (ref 65–99)

## 2016-04-29 LAB — CBC
HEMATOCRIT: 35.3 % — AB (ref 36.0–46.0)
HEMOGLOBIN: 10.8 g/dL — AB (ref 12.0–15.0)
MCH: 23.3 pg — ABNORMAL LOW (ref 26.0–34.0)
MCHC: 30.6 g/dL (ref 30.0–36.0)
MCV: 76.2 fL — AB (ref 78.0–100.0)
Platelets: 406 10*3/uL — ABNORMAL HIGH (ref 150–400)
RBC: 4.63 MIL/uL (ref 3.87–5.11)
RDW: 17.3 % — ABNORMAL HIGH (ref 11.5–15.5)
WBC: 5.4 10*3/uL (ref 4.0–10.5)

## 2016-04-29 LAB — BASIC METABOLIC PANEL
ANION GAP: 12 (ref 5–15)
BUN: <5 mg/dL — ABNORMAL LOW (ref 6–20)
CHLORIDE: 98 mmol/L — AB (ref 101–111)
CO2: 26 mmol/L (ref 22–32)
Calcium: 8.1 mg/dL — ABNORMAL LOW (ref 8.9–10.3)
Creatinine, Ser: 0.82 mg/dL (ref 0.44–1.00)
GFR calc Af Amer: >60 mL/min (ref >60)
GFR calc non Af Amer: >60 mL/min (ref >60)
GLUCOSE: 195 mg/dL — AB (ref 65–99)
POTASSIUM: 3.5 mmol/L (ref 3.5–5.1)
Sodium: 136 mmol/L (ref 135–145)

## 2016-04-29 LAB — MAGNESIUM: Magnesium: 1.9 mg/dL (ref 1.7–2.4)

## 2016-04-29 MED ORDER — POTASSIUM CHLORIDE CRYS ER 20 MEQ PO TBCR
40.0000 meq | EXTENDED_RELEASE_TABLET | Freq: Once | ORAL | Status: AC
  Administered 2016-04-29: 40 meq via ORAL
  Filled 2016-04-29: qty 2

## 2016-04-29 MED ORDER — INSULIN GLARGINE 100 UNIT/ML ~~LOC~~ SOLN
20.0000 [IU] | Freq: Every day | SUBCUTANEOUS | Status: DC
  Administered 2016-04-29: 20 [IU] via SUBCUTANEOUS
  Filled 2016-04-29 (×2): qty 0.2

## 2016-04-29 NOTE — Progress Notes (Signed)
PROGRESS NOTE    Latoya Roth  CEY:[id number] DOB: [birth year] DOA: 04/27/2016 PCP: Benito Mccreedy, MD  Outpatient Specialists:    Assessment & Plan:   Principal Problem:   DKA (diabetic ketoacidoses) ([place]) Active Problems:   Hypokalemia   BMI 40.0-44.9, adult ([place])   Hypertension   Generalized weakness   Odynophagia   Oral candidiasis   Asthma   AKI (acute kidney injury) ([place])   UTI (lower urinary tract infection)  #1 DKA versus hyperosmolar hyperglycemic state Questionable etiology. Likely secondary to UTI as urinalysis was consistent with a UTI. Check urine cultures. EKG with normal sinus rhythm. No ischemic changes. Prolonged QT. Chest x-ray unremarkable. Cycle cardiac enzymes negative 3. Hemoglobin A1c pending. Patient was on the glucose stabilizer however have subsequently transitioned to subcutaneous insulin Lantus 10 units daily with sliding scale insulin. Patient has been placed on a carb modified diet. Patient states on the on oral hypoglycemic agents however states she is a type I diabetic. Patient noted on admission to have elevated beta hydroxy butyric acid with a anion gap of 19 and the blood glucose level of 479. Patient however was not acidotic on admission. Anion gap closed. Increase Lantus to 20 units daily. Saline lock IV fluids. Diabetic coordinator consulting.  #2 hypokalemia Likely secondary to problem #1 in the setting of diuretics. Magnesium level at 1.9 today. Replete.   #3 Odynophagia secondary to oral candidiasis Patient noted to have oral candidiasis per admitting physician and patient had presented with odynophagia. Clinical improvement. Continue Diflucan. Continue Magic mouthwash. Follow.  #4 urinary tract infection Urine cultures pending. Continue ciprofloxacin.   #5 asthma Stable. Continue Dulera.  #6 hypertension Stable. Continue HCTZ.  #7 acute kidney injury Likely secondary to dehydration. Resolved with IV fluids. Follow.   DVT  prophylaxis: SCDs Code Status: Full Family Communication: Updated patient. No family at bedside. Disposition Plan: Hopefully home tomorrow.   Consultants:   None  Procedures:   Chest x-ray 04/28/2016  Antimicrobials:   IV ciprofloxacin 04/27/2016>>>> oral ciprofloxacin 04/28/2016   Subjective: Patient states feeling better. No chest pain. No shortness of breath. Swallowing difficulties improved.  Objective: Filed Vitals:   04/28/16 1619 04/28/16 2122 04/29/16 0529 04/29/16 0945  BP: 138/92 123/73 134/75 124/81  Pulse: 86 90 83 87  Temp: 97.8 F (36.6 C) 98.4 F (36.9 C) 98 F (36.7 C) 98.2 F (36.8 C)  TempSrc: Oral Oral Oral Oral  Resp: '17 18 16 16  ' Height:      Weight:      SpO2: 96% 97% 97% 98%    Intake/Output Summary (Last 24 hours) at 04/29/16 1531 Last data filed at 04/29/16 0912  Gross per 24 hour  Intake 2412.5 ml  Output    500 ml  Net 1912.5 ml   Filed Weights   04/27/16 1100 04/28/16 0000  Weight: 118.842 kg (262 lb) 108.41 kg (239 lb)    Examination:  General exam: Appears calm and comfortable.  Respiratory system: Clear to auscultation. Respiratory effort normal. Cardiovascular system: S1 & S2 heard, RRR. No JVD, murmurs, rubs, gallops or clicks. No pedal edema. Gastrointestinal system: Abdomen is nondistended, soft and nontender. No organomegaly or masses felt. Normal bowel sounds heard. Central nervous system: Alert and oriented. No focal neurological deficits. Extremities: Symmetric 5 x 5 power. Skin: No rashes, lesions or ulcers Psychiatry: Judgement and insight appear normal. Mood & affect appropriate.     Data Reviewed: I have personally reviewed following labs and imaging studies  CBC:  Recent  Labs Lab 04/27/16 1122 04/29/16 0457  WBC 8.7 5.4  HGB 13.4 10.8*  HCT 41.4 35.3*  MCV 74.6* 76.2*  PLT 499* 491*   Basic Metabolic Panel:  Recent Labs Lab 04/27/16 1242 04/27/16 1543 04/27/16 2323 04/28/16 0224  04/28/16 0652 04/28/16 0929 04/29/16 0457  NA  --  135 135 135 137  --  136  K  --  2.8* 3.3* 3.2* 2.8*  --  3.5  CL  --  92* 94* 95* 95*  --  98*  CO2  --  '27 26 28 28  ' --  26  GLUCOSE  --  220* 132* 144* 154*  --  195*  BUN  --  '12 11 8 6  ' --  <5*  CREATININE  --  0.99 0.95 0.88 0.81  --  0.82  CALCIUM  --  8.7* 8.4* 8.3* 8.2*  --  8.1*  MG 1.8  --   --   --   --  1.7 1.9   GFR: Estimated Creatinine Clearance: 108.7 mL/min (by C-G formula based on Cr of 0.82). Liver Function Tests: No results for input(s): AST, ALT, ALKPHOS, BILITOT, PROT, ALBUMIN in the last 168 hours. No results for input(s): LIPASE, AMYLASE in the last 168 hours. No results for input(s): AMMONIA in the last 168 hours. Coagulation Profile: No results for input(s): INR, PROTIME in the last 168 hours. Cardiac Enzymes:  Recent Labs Lab 04/28/16 0929 04/28/16 1438 04/28/16 1821  TROPONINI 0.03 <0.03 <0.03   BNP (last 3 results) No results for input(s): PROBNP in the last 8760 hours. HbA1C: No results for input(s): HGBA1C in the last 72 hours. CBG:  Recent Labs Lab 04/28/16 0804 04/28/16 1212 04/28/16 1613 04/28/16 2153 04/29/16 0752  GLUCAP 173* 204* 162* 160* 186*   Lipid Profile: No results for input(s): CHOL, HDL, LDLCALC, TRIG, CHOLHDL, LDLDIRECT in the last 72 hours. Thyroid Function Tests: No results for input(s): TSH, T4TOTAL, FREET4, T3FREE, THYROIDAB in the last 72 hours. Anemia Panel: No results for input(s): VITAMINB12, FOLATE, FERRITIN, TIBC, IRON, RETICCTPCT in the last 72 hours. Urine analysis:    Component Value Date/Time   COLORURINE AMBER* 04/27/2016 1124   APPEARANCEUR TURBID* 04/27/2016 1124   LABSPEC 1.026 04/27/2016 1124   PHURINE 6.0 04/27/2016 1124   GLUCOSEU >1000* 04/27/2016 1124   HGBUR MODERATE* 04/27/2016 1124   BILIRUBINUR LARGE* 04/27/2016 1124   BILIRUBINUR neg 01/15/2014 0933   KETONESUR 15* 04/27/2016 1124   PROTEINUR 100* 04/27/2016 1124   PROTEINUR  neg 01/15/2014 0933   UROBILINOGEN 1.0 03/13/2014 0902   UROBILINOGEN negative 01/15/2014 0933   NITRITE NEGATIVE 04/27/2016 1124   NITRITE neg 01/15/2014 0933   LEUKOCYTESUR LARGE* 04/27/2016 1124   Sepsis Labs: '@[email or web address]' (procalcitonin:4,lacticidven:4)  ) Recent Results (from the past 240 hour(s))  MRSA PCR Screening     Status: None   Collection Time: 04/27/16  7:56 PM  Result Value Ref Range Status   MRSA by PCR NEGATIVE NEGATIVE Final    Comment:        The GeneXpert MRSA Assay (FDA approved for NASAL specimens only), is one component of a comprehensive MRSA colonization surveillance program. It is not intended to diagnose MRSA infection nor to guide or monitor treatment for MRSA infections.          Radiology Studies: Dg Chest 2 View  04/28/2016  CLINICAL DATA:  Diabetic ketoacidosis versus hyper osmolar hyperglycemic state. EXAM: CHEST  2 VIEW COMPARISON:  April 12, 2016 FINDINGS: The heart size and  mediastinal contours are within normal limits. Both lungs are clear. The visualized skeletal structures are unremarkable. IMPRESSION: No active cardiopulmonary disease. Electronically Signed   By: Dorise Bullion III M.D   On: 04/28/2016 11:47        Scheduled Meds: . ciprofloxacin  500 mg Oral BID  . fluconazole  100 mg Oral Daily  . hydrochlorothiazide  25 mg Oral Daily  . insulin aspart  0-15 Units Subcutaneous TID WC  . insulin aspart  0-5 Units Subcutaneous QHS  . insulin glargine  20 Units Subcutaneous QHS  . insulin starter kit- pen needles  1 kit Other Once  . loratadine  10 mg Oral Daily  . mometasone-formoterol  2 puff Inhalation BID  . multivitamin with minerals  1 tablet Oral Daily  . potassium chloride  40 mEq Oral Once   Continuous Infusions:     LOS: 2 days    Time spent: 35 minutes    Mohab Ashby, MD [facility] Pager [phone]  If 7PM-7AM, please contact night-coverage [email or web address] Password [email or web address] 04/29/2016, 3:31 PM

## 2016-04-30 DIAGNOSIS — E1101 Type 2 diabetes mellitus with hyperosmolarity with coma: Secondary | ICD-10-CM

## 2016-04-30 DIAGNOSIS — E1100 Type 2 diabetes mellitus with hyperosmolarity without nonketotic hyperglycemic-hyperosmolar coma (NKHHC): Secondary | ICD-10-CM | POA: Diagnosis present

## 2016-04-30 DIAGNOSIS — E1110 Type 2 diabetes mellitus with ketoacidosis without coma: Secondary | ICD-10-CM | POA: Insufficient documentation

## 2016-04-30 LAB — CBC
HCT: 34.9 % — ABNORMAL LOW (ref 36.0–46.0)
Hemoglobin: 10.7 g/dL — ABNORMAL LOW (ref 12.0–15.0)
MCH: 23.9 pg — AB (ref 26.0–34.0)
MCHC: 30.7 g/dL (ref 30.0–36.0)
MCV: 78.1 fL (ref 78.0–100.0)
PLATELETS: 340 10*3/uL (ref 150–400)
RBC: 4.47 MIL/uL (ref 3.87–5.11)
RDW: 17.5 % — AB (ref 11.5–15.5)
WBC: 5.4 10*3/uL (ref 4.0–10.5)

## 2016-04-30 LAB — GLUCOSE, CAPILLARY
GLUCOSE-CAPILLARY: 201 mg/dL — AB (ref 65–99)
Glucose-Capillary: 168 mg/dL — ABNORMAL HIGH (ref 65–99)
Glucose-Capillary: 143 mg/dL — ABNORMAL HIGH (ref 65–99)
Glucose-Capillary: 157 mg/dL — ABNORMAL HIGH (ref 65–99)

## 2016-04-30 LAB — BASIC METABOLIC PANEL
Anion gap: 13 (ref 5–15)
CALCIUM: 8.4 mg/dL — AB (ref 8.9–10.3)
CO2: 28 mmol/L (ref 22–32)
Chloride: 95 mmol/L — ABNORMAL LOW (ref 101–111)
Creatinine, Ser: 0.77 mg/dL (ref 0.44–1.00)
GFR calc Af Amer: >60 mL/min (ref >60)
GLUCOSE: 167 mg/dL — AB (ref 65–99)
POTASSIUM: 3.4 mmol/L — AB (ref 3.5–5.1)
SODIUM: 136 mmol/L (ref 135–145)

## 2016-04-30 LAB — URINE CULTURE: Culture: 60000 — AB

## 2016-04-30 LAB — HEMOGLOBIN A1C
Hgb A1c MFr Bld: 12.7 % — ABNORMAL HIGH (ref 4.8–5.6)
Mean Plasma Glucose: 318 mg/dL

## 2016-04-30 MED ORDER — BLOOD GLUCOSE METER KIT: PACK | Status: DC

## 2016-04-30 MED ORDER — POTASSIUM CHLORIDE CRYS ER 20 MEQ PO TBCR: 20.0000 meq | EXTENDED_RELEASE_TABLET | Freq: Every day | ORAL | Status: DC

## 2016-04-30 MED ORDER — INSULIN PEN NEEDLE 31G X 5 MM MISC: 20.0000 [IU] | Freq: Every day | Status: DC

## 2016-04-30 MED ORDER — POTASSIUM CHLORIDE CRYS ER 20 MEQ PO TBCR
40.0000 meq | EXTENDED_RELEASE_TABLET | Freq: Once | ORAL | Status: AC
  Administered 2016-04-30: 40 meq via ORAL

## 2016-04-30 MED ORDER — FLUCONAZOLE 100 MG PO TABS: 100.0000 mg | ORAL_TABLET | Freq: Every day | ORAL | Status: DC

## 2016-04-30 MED ORDER — POTASSIUM CHLORIDE CRYS ER 20 MEQ PO TBCR
20.0000 meq | EXTENDED_RELEASE_TABLET | Freq: Every day | ORAL | Status: DC
  Filled 2016-04-30 (×2): qty 1

## 2016-04-30 MED ORDER — INSULIN GLARGINE 100 UNIT/ML SOLOSTAR PEN: 20.0000 [IU] | PEN_INJECTOR | Freq: Every day | SUBCUTANEOUS | Status: DC

## 2016-04-30 NOTE — Care Management Note (Signed)
Case Management Note  Patient Details  Name: WILTON ANSARA MRN: [id number] Date of Birth: 09/15/1971  Subjective/Objective:   DKA               Action/Plan: Discharge Planning: AVS reviewed:  NCM spoke to pt at bedside. States her mother and grandmother had DM. States she does have Medicaid. Explained to pt that Lantus is a preferred drug for [place] Medicaid. Pt has received teaching on DM. States she does walk but no exercise program. Encouraged her to follow up with her PCP for continued ways to manage DM.   PCP- Benito Mccreedy MD  Expected Discharge Date:  04/30/2016               Expected Discharge Plan:  Home/Self Care  In-House Referral:  NA  Discharge planning Services  Medication Assistance, CM Consult  Post Acute Care Choice:  NA Choice offered to:  NA  DME Arranged:  N/A DME Agency:  NA  HH Arranged:  NA HH Agency:  NA  Status of Service:  Completed, signed off  Medicare Important Message Given:    Date Medicare IM Given:    Medicare IM give by:    Date Additional Medicare IM Given:    Additional Medicare Important Message give by:     If discussed at [place] of Stay Meetings, dates discussed:    Additional Comments:  Erenest Rasher, RN 04/30/2016, 4:14 PM

## 2016-04-30 NOTE — Plan of Care (Signed)
Problem: Food- and Nutrition-Related Knowledge Deficit (NB-1.1) Goal: Nutrition education Formal process to instruct or train a patient/client in a skill or to impart knowledge to help patients/clients voluntarily manage or modify food choices and eating behavior to maintain or improve health. Outcome: Completed/Met Date Met:  04/30/16 NUTRITION EDUCATION NOTE:  Pt seen for consult for diabetes diet education.   Pt provided with handouts "Diabetes Label Reading Tips" and "Carbohydrate Counting for People with Diabetes" from the [employer]. Intern discussed foods with carbohydrates, servings sizes, how to read a label, and meal planning tips.   Expect poor compliance.    Pt would greatly benefit from outpatient diabetes counseling. Pt had no prior knowledge of a diabetic diet. Intern discussed diet in detail and utilized teach back method. Pt had difficulty understanding content. Will need further follow up upon discharge. Labs and meds reviewed.   Geoffery Lyons, [place] [facility] Dietetic Intern Pager [phone]

## 2016-04-30 NOTE — Progress Notes (Signed)
Per pharmacy insulin Starter Kit on order and should be here tomorrow.

## 2016-04-30 NOTE — Discharge Summary (Signed)
Physician Discharge Summary  Latoya Roth [id number] DOB: 1971/05/13 DOA: 04/27/2016  PCP: Benito Mccreedy, MD  Admit date: 04/27/2016 Discharge date: 04/30/2016  Time spent: 65 minutes  Recommendations for Outpatient Follow-up:  1. Follow-up with OSEI-BONSU,GEORGE, MD in 1 week. On follow-up patient will need a basic metabolic profile done to follow-up on electrolytes and renal function. Patient has been placed on K Dur 20 mEq daily due to hypokalemia felt in part to be secondary to HCTZ. Patient has also been started on Lantus in addition to oral hypoglycemic agents which will need to be followed upon per PCP.   Discharge Diagnoses:  Principal Problem:   Type 2 diabetes mellitus with hyperosmolar nonketotic hyperglycemia (HCC) Active Problems:   Hypokalemia   BMI 40.0-44.9, adult (HCC)   Hypertension   Generalized weakness   Odynophagia   Oral candidiasis   Asthma   AKI (acute kidney injury) ([place])   UTI (lower urinary tract infection)   Discharge Condition: stable and improved.  Diet recommendation: Carb modified  Filed Weights   04/27/16 1100 04/28/16 0000 04/29/16 2137  Weight: 118.842 kg (262 lb) 108.41 kg (239 lb) 117.3 kg (258 lb 9.6 oz)    History of present illness:  Per Dr Landis Gandy Haskel Khan is a 45 y.o. female with medical history significant for HTN, Asthma, DM2 non insulin dependent, presented to the ED in increasing generalized weakness. Over the last 2 weeks, she reported decreased oral intake, due to increased odynophagia without dysphagia or dysphasia, accompanied by increased gait instability. Denied any chest pain, or shortness of breath. Denied any fever or chills, or night sweats. Had some dizziness, with acute onset of nausea Does not smoke, drink or partake recreational drugs. No new meds or hormonal supplements. Denied any recent long distance trips.   Hospital Course:  #1 Type 2 diabetes mellitus with hyperosmolar hyperglycemic  state Questionable etiology. Likely secondary to UTI as urinalysis was consistent with a UTI. Urine cultures were obtained were pending at time of discharge. EKG with normal sinus rhythm. No ischemic changes. Prolonged QT. Chest x-ray unremarkable. Cycle cardiac enzymes negative 3. Patient was admitted placed in the step down unit and placed on the glucose stabilizer. Patient underwent serial BMETs and electrolytes replaced accordingly. Patient's blood sugars improved and patient was subsequently transitioned to subcutaneous insulin. Patient has been placed on a carb modified diet. Patient states on the on oral hypoglycemic agents however states she is a type I diabetic. Patient noted on admission to have elevated beta hydroxy butyric acid with a anion gap of 19 and the blood glucose level of 479. Patient however was not acidotic on admission. Anion gap closed. Lantus dose was adjusted to 20 units daily. Patient's oral hypoglycemic agents were held and patient was maintained on a sliding scale insulin. Hemoglobin A1c obtained came back elevated at 12.7. Patient received diabetic education. Patient be discharged home on Lantus 20 units daily in addition to Amaryl and metformin. Outpatient follow-up.   #2 hypokalemia Likely secondary to problem #1 in the setting of diuretics. Magnesium level at 1.9 today. Repleted. Patient will be discharged home on 20 mEq of potassium daily. Close outpatient follow-up.  #3 Odynophagia secondary to oral candidiasis Patient noted to have oral candidiasis per admitting physician and patient had presented with odynophagia. Patient improved clinically. Patient was placed on Magic mouthwash and Diflucan and will be discharged on 5 more days of treatment.   #4 urinary tract infection Urine cultures pending. Patient received 3 days of ciprofloxacin.   #  5 asthma Stable. Continued on Dulera.  #6 hypertension Stable. Continued on home regimen HCTZ.  #7 acute kidney  injury Likely secondary to dehydration. Resolved with IV fluids. Follow.   Procedures:  Chest x-ray 04/28/2016    Consultations:  None  Discharge Exam: Filed Vitals:   04/30/16 0544 04/30/16 0945  BP: 141/82 115/57  Pulse: 85 84  Temp: 98.9 F (37.2 C) 98.2 F (36.8 C)  Resp: 18 16    General: NAD Cardiovascular: RRR Respiratory: CTAB  Discharge Instructions   Discharge Instructions    Ambulatory referral to Nutrition and Diabetic Education    Complete by:  As directed      Diet Carb Modified    Complete by:  As directed      Discharge instructions    Complete by:  As directed   Follow up with OSEI-BONSU,GEORGE, MD in 1-2 weeks.     Increase activity slowly    Complete by:  As directed           Current Discharge Medication List    START taking these medications   Details  blood glucose meter kit and supplies Dispense based on patient and insurance preference. Use up to four times daily as directed. (FOR ICD-9 250.00, 250.01). Qty: 1 each, Refills: 0    fluconazole (DIFLUCAN) 100 MG tablet Take 1 tablet (100 mg total) by mouth daily. Take for 5 days then stop. Qty: 5 tablet, Refills: 0    Insulin Glargine (LANTUS SOLOSTAR) 100 UNIT/ML Solostar Pen Inject 20 Units into the skin daily at 10 pm. Qty: 15 mL, Refills: 3    Insulin Pen Needle 31G X 5 MM MISC 20 Units by Does not apply route at bedtime. Qty: 100 each, Refills: 0    potassium chloride SA (K-DUR,KLOR-CON) 20 MEQ tablet Take 1 tablet (20 mEq total) by mouth daily. Qty: 30 tablet, Refills: 0      CONTINUE these medications which have NOT CHANGED   Details  albuterol (PROVENTIL HFA;VENTOLIN HFA) 108 (90 BASE) MCG/ACT inhaler Inhale 2 puffs into the lungs every 6 (six) hours as needed for wheezing or shortness of breath.    albuterol (PROVENTIL) (2.5 MG/3ML) 0.083% nebulizer solution Take 2.5 mg by nebulization every 6 (six) hours as needed. For shortness of breath    albuterol-ipratropium  (COMBIVENT) 18-103 MCG/ACT inhaler Inhale 2 puffs into the lungs every 6 (six) hours as needed for shortness of breath. Reported on 04/27/2016    budesonide-formoterol (SYMBICORT) 160-4.5 MCG/ACT inhaler Inhale 2 puffs into the lungs 2 (two) times daily.    cetirizine (ZYRTEC) 10 MG tablet Take 10 mg by mouth daily.    glimepiride (AMARYL) 4 MG tablet Take 4 mg by mouth daily with breakfast.    hydrochlorothiazide (HYDRODIURIL) 25 MG tablet Take 25 mg by mouth daily.    hydrOXYzine (ATARAX/VISTARIL) 25 MG tablet Take 25 mg by mouth 3 (three) times daily as needed for itching.    metFORMIN (GLUCOPHAGE) 500 MG tablet Take 500 mg by mouth 2 (two) times daily with a meal.    Multiple Vitamin (MULTIVITAMIN WITH MINERALS) TABS tablet Take 1 tablet by mouth daily.       Allergies  Allergen Reactions  . Penicillins Anaphylaxis  . Shellfish Allergy Anaphylaxis, Hives and Swelling   Follow-up Information    Follow up with OSEI-BONSU,GEORGE, MD. Schedule an appointment as soon as possible for a visit in 1 week.   Specialty:  Internal Medicine   Why:  f/u in 1 weeks  Contact information:   [address] [place] [address] [phone]        The results of significant diagnostics from this hospitalization (including imaging, microbiology, ancillary and laboratory) are listed below for reference.    Significant Diagnostic Studies: Dg Chest 2 View  04/28/2016  CLINICAL DATA:  Diabetic ketoacidosis versus hyper osmolar hyperglycemic state. EXAM: CHEST  2 VIEW COMPARISON:  April 12, 2016 FINDINGS: The heart size and mediastinal contours are within normal limits. Both lungs are clear. The visualized skeletal structures are unremarkable. IMPRESSION: No active cardiopulmonary disease. Electronically Signed   By: Dorise Bullion III M.D   On: 04/28/2016 11:47   Dg Chest 2 View  04/12/2016  CLINICAL DATA:  Weakness EXAM: CHEST  2 VIEW COMPARISON:  06/08/2015 FINDINGS: Cardiac shadow is  within normal limits. The lungs are well aerated bilaterally. No focal infiltrate or sizable effusion is seen. Mild chronic interstitial changes are again noted. No bony abnormality is seen. IMPRESSION: No active cardiopulmonary disease. Electronically Signed   By: Inez Catalina M.D.   On: 04/12/2016 21:56   Ct Head Wo Contrast  04/27/2016  CLINICAL DATA:  Hyperglycemia.  Weakness.  Off balance. EXAM: CT HEAD WITHOUT CONTRAST TECHNIQUE: Contiguous axial images were obtained from the base of the skull through the vertex without intravenous contrast. COMPARISON:  10/28/2012 FINDINGS: Examination is degraded due to patient motion artifact Brain: Gray-white differentiation is maintained. No CT evidence of acute large territory infarct. No intraparenchymal or extra-axial mass or hemorrhage. Normal size and configuration of the ventricles and basilar cisterns. No midline shift. Vascular: No hyperdense vessel or unexpected calcification. Skull: Negative for fracture or focal lesion. Sinuses/Orbits: There is underpneumatization the bilateral frontal sinuses. The remaining paranasal sinuses and mastoid air cells are normal. No air-fluid levels. Other: Regional soft tissues appear normal. IMPRESSION: Degraded examination without acute intracranial process. Electronically Signed   By: Sandi Mariscal M.D.   On: 04/27/2016 13:54    Microbiology: Recent Results (from the past 240 hour(s))  MRSA PCR Screening     Status: None   Collection Time: 04/27/16  7:56 PM  Result Value Ref Range Status   MRSA by PCR NEGATIVE NEGATIVE Final    Comment:        The GeneXpert MRSA Assay (FDA approved for NASAL specimens only), is one component of a comprehensive MRSA colonization surveillance program. It is not intended to diagnose MRSA infection nor to guide or monitor treatment for MRSA infections.      Labs: Basic Metabolic Panel:  Recent Labs Lab 04/27/16 1242  04/27/16 2323 04/28/16 0224 04/28/16 1093  04/28/16 0929 04/29/16 0457 04/30/16 0502  NA  --   < > 135 135 137  --  136 136  K  --   < > 3.3* 3.2* 2.8*  --  3.5 3.4*  CL  --   < > 94* 95* 95*  --  98* 95*  CO2  --   < > '26 28 28  ' --  26 28  GLUCOSE  --   < > 132* 144* 154*  --  195* 167*  BUN  --   < > '11 8 6  ' --  <5* <5*  CREATININE  --   < > 0.95 0.88 0.81  --  0.82 0.77  CALCIUM  --   < > 8.4* 8.3* 8.2*  --  8.1* 8.4*  MG 1.8  --   --   --   --  1.7 1.9  --   < > =  values in this interval not displayed. Liver Function Tests: No results for input(s): AST, ALT, ALKPHOS, BILITOT, PROT, ALBUMIN in the last 168 hours. No results for input(s): LIPASE, AMYLASE in the last 168 hours. No results for input(s): AMMONIA in the last 168 hours. CBC:  Recent Labs Lab 04/27/16 1122 04/29/16 0457 04/30/16 0502  WBC 8.7 5.4 5.4  HGB 13.4 10.8* 10.7*  HCT 41.4 35.3* 34.9*  MCV 74.6* 76.2* 78.1  PLT 499* 406* 340   Cardiac Enzymes:  Recent Labs Lab 04/28/16 0929 04/28/16 1438 04/28/16 1821  TROPONINI 0.03 <0.03 <0.03   BNP: BNP (last 3 results) No results for input(s): BNP in the last 8760 hours.  ProBNP (last 3 results) No results for input(s): PROBNP in the last 8760 hours.  CBG:  Recent Labs Lab 04/29/16 1158 04/29/16 1708 04/29/16 2136 04/30/16 0807 04/30/16 1219  GLUCAP 201* 216* 167* 168* 143*       Signed:  THOMPSON,DANIEL MD.  [facility] 04/30/2016, 4:20 PM

## 2016-04-30 NOTE — Progress Notes (Signed)
Patient self administered SQ insulin.  RN observed.  When asked about how she felt about giving herself insulin SQ patient stated "I feel very comfortable".

## 2016-04-30 NOTE — Progress Notes (Signed)
Patient viewed Diabetes videos thorough the patient education network.   Discharge instructions and medications discussed with patient. Prescriptions given to patient.  All questions answered.

## 2016-04-30 NOTE — Progress Notes (Signed)
Inpatient Diabetes Program Recommendations  AACE/ADA: New Consensus Statement on Inpatient Glycemic Control (2015)  Target Ranges:  Prepandial:   less than 140 mg/dL      Peak postprandial:   less than 180 mg/dL (1-2 hours)      Critically ill patients:  140 - 180 mg/dL   Review of Glycemic Control:   Results for CARMISHA, LARUSSO (MRN [id number]) as of 04/30/2016 16:02  Ref. Range 04/28/2016 09:21  Hemoglobin A1C Latest Ref Range: 4.8-5.6 % 12.7 (H)   Diabetes history: Type 2 Diabetes Outpatient Diabetes medications:  Amaryl 4 mg with breakfast, Metformin 500 mg bid Current orders for Inpatient glycemic control:  Lantus 20 units q HS, Novolog moderate tid with meals and HS Outpatient Diabetes medications:   Inpatient Diabetes Program Recommendations:    Spoke with patient regarding home diabetes management.  Discussed results of A1C with patient and gave her "Know your numbers" handout regarding standards of care and goal A1C.  Patient states she needs a new glucose meter.  Discussed that she will likely need insulin at d/c and she was agreeable.  Discussed survival skills of diabetes including hyperglycemia, hypoglycemia, and normal blood sugars.  Patient was able to teach back signs and symptoms and treatment of hypoglycemia.  Demonstrated use of insulin pen to patient and gave her insulin pen kit.  She was able to properly demonstrate use of insulin pen including 2 unit prime, dialing dose, administration and counting to 10 seconds.  Discussed proper disposal of sharps also.  Will also order "outpatient diabetes" follow-up per protocol.  Discussed with Dr. Grandville Silos.  Patient is to follow-up with Dr. Vista Lawman.  Thanks, Adah Perl, RN, BC-ADM Inpatient Diabetes Coordinator Pager [phone] (8a-5p)

## 2016-06-11 ENCOUNTER — Other Ambulatory Visit: Payer: Self-pay | Admitting: Family Medicine

## 2016-06-11 NOTE — Telephone Encounter (Signed)
We received a refill request for Tylenol #3.  Dr. Adrian Blackwater has never seen this patient or prescribed any medications for her. This patient has also never been seen in our clinic.  I denied the refill request.  I called [employer] and asked that they find the original prescription and verify that it had Dr. Adrian Blackwater name on that to ensure that it was not a data entry issue on their part. They will call me back with what they find and we can proceed from there.

## 2016-06-12 NOTE — Telephone Encounter (Signed)
Spoke with [employer] and they filed the prescription under the wrong name. They have corrected the issue.

## 2016-10-01 ENCOUNTER — Encounter (HOSPITAL_COMMUNITY): Payer: Self-pay

## 2016-10-01 ENCOUNTER — Emergency Department (HOSPITAL_COMMUNITY)
Admission: EM | Admit: 2016-10-01 | Discharge: 2016-10-01 | Disposition: A | Discharge status: Home / Self Care | Payer: Medicaid Other | Attending: Emergency Medicine | Admitting: Emergency Medicine

## 2016-10-01 ENCOUNTER — Emergency Department (HOSPITAL_COMMUNITY): Payer: Medicaid Other

## 2016-10-01 DIAGNOSIS — Y9301 Activity, walking, marching and hiking: Secondary | ICD-10-CM | POA: Insufficient documentation

## 2016-10-01 DIAGNOSIS — Z7984 Long term (current) use of oral hypoglycemic drugs: Secondary | ICD-10-CM | POA: Diagnosis not present

## 2016-10-01 DIAGNOSIS — I10 Essential (primary) hypertension: Secondary | ICD-10-CM | POA: Insufficient documentation

## 2016-10-01 DIAGNOSIS — Z79899 Other long term (current) drug therapy: Secondary | ICD-10-CM | POA: Diagnosis not present

## 2016-10-01 DIAGNOSIS — Y999 Unspecified external cause status: Secondary | ICD-10-CM | POA: Diagnosis not present

## 2016-10-01 DIAGNOSIS — M25561 Pain in right knee: Secondary | ICD-10-CM | POA: Insufficient documentation

## 2016-10-01 DIAGNOSIS — E1165 Type 2 diabetes mellitus with hyperglycemia: Secondary | ICD-10-CM | POA: Insufficient documentation

## 2016-10-01 DIAGNOSIS — W1839XA Other fall on same level, initial encounter: Secondary | ICD-10-CM | POA: Insufficient documentation

## 2016-10-01 DIAGNOSIS — Y929 Unspecified place or not applicable: Secondary | ICD-10-CM | POA: Diagnosis not present

## 2016-10-01 DIAGNOSIS — Z794 Long term (current) use of insulin: Secondary | ICD-10-CM | POA: Diagnosis not present

## 2016-10-01 DIAGNOSIS — E1110 Type 2 diabetes mellitus with ketoacidosis without coma: Secondary | ICD-10-CM | POA: Insufficient documentation

## 2016-10-01 DIAGNOSIS — J45909 Unspecified asthma, uncomplicated: Secondary | ICD-10-CM | POA: Insufficient documentation

## 2016-10-01 MED ORDER — HYDROCODONE-ACETAMINOPHEN 5-325 MG PO TABS
1.0000 | ORAL_TABLET | Freq: Once | ORAL | Status: AC
  Administered 2016-10-01: 1 via ORAL
  Filled 2016-10-01: qty 1

## 2016-10-01 MED ORDER — HYDROCODONE-ACETAMINOPHEN 5-325 MG PO TABS: 1.0000 | ORAL_TABLET | Freq: Four times a day (QID) | ORAL | 0 refills | Status: DC | PRN

## 2016-10-01 MED ORDER — IBUPROFEN 800 MG PO TABS: 800.0000 mg | ORAL_TABLET | Freq: Three times a day (TID) | ORAL | 0 refills | Status: DC | PRN

## 2016-10-01 NOTE — Discharge Instructions (Signed)
Ibuprofen as needed for mild to moderate pain. Pain medication only as needed for severe pain.  Ice and elevate for additional pain relief.  If symptoms persist on Friday, please call the orthopedic physician listed to schedule a follow up appointment.  Return to ER for new or worsening symptoms, any additional concerns.

## 2016-10-01 NOTE — ED Provider Notes (Signed)
[facility] DEPT Provider Note   CSN: [id number] Arrival date & time: 10/01/16  1243 By signing my name below, I, Georgette Shell, attest that this documentation has been prepared under the direction and in the presence of [facility], PA-C. Electronically Signed: Georgette Shell, ED Scribe. 10/01/16. 3:52 PM.  History   Chief Complaint Chief Complaint  Patient presents with  . knee pain x 1 week   HPI Comments: Latoya Roth is a 45 y.o. female who presents to the Emergency Department complaining of persistent, unchanged 7/10 right knee pain with swelling s/p mechanical fall one week ago. Pt states she was walking and fell on her side. She reports that pain is exacerbated with ambulating. No OTC medications tried PTA. Denies h/o similar symptoms. She denies fever, numbness, weakness.   The history is provided by the patient. No language interpreter was used.    Past Medical History:  Diagnosis Date  . Anxiety   . Arthritis   . Asthma   . Depression   . Diabetes mellitus   . Hypertension   . Obesity     Patient Active Problem List   Diagnosis Date Noted  . Type 2 diabetes mellitus with hyperosmolar nonketotic hyperglycemia ([place]) 04/30/2016  . Type 2 diabetes mellitus with ketoacidosis without coma   . AKI (acute kidney injury) ([place]) 04/28/2016  . UTI (lower urinary tract infection)   . DKA (diabetic ketoacidoses) ([place]) 04/27/2016  . Hypertension 04/27/2016  . Generalized weakness 04/27/2016  . Odynophagia 04/27/2016  . Oral candidiasis 04/27/2016  . Asthma 04/27/2016  . Hypokalemia   . BMI 40.0-44.9, adult ([place]) 01/15/2014  . High blood pressure 01/15/2014    Past Surgical History:  Procedure Laterality Date  . CHOLECYSTECTOMY      OB History    Gravida Para Term Preterm AB Living   2 2 2  0 0 2   SAB TAB Ectopic Multiple Live Births   0 0 0 0 2       Home Medications    Prior to Admission medications   Medication Sig Start Date End Date Taking? Authorizing  Provider  albuterol (PROVENTIL HFA;VENTOLIN HFA) 108 (90 BASE) MCG/ACT inhaler Inhale 2 puffs into the lungs every 6 (six) hours as needed for wheezing or shortness of breath.   Yes Historical Provider, MD  albuterol (PROVENTIL) (2.5 MG/3ML) 0.083% nebulizer solution Take 2.5 mg by nebulization every 6 (six) hours as needed. For shortness of breath   Yes Historical Provider, MD  budesonide-formoterol (SYMBICORT) 160-4.5 MCG/ACT inhaler Inhale 2 puffs into the lungs 2 (two) times daily.   Yes Historical Provider, MD  cetirizine (ZYRTEC) 10 MG tablet Take 10 mg by mouth daily.   Yes Historical Provider, MD  hydrochlorothiazide (HYDRODIURIL) 25 MG tablet Take 25 mg by mouth daily.   Yes Historical Provider, MD  hydrOXYzine (ATARAX/VISTARIL) 25 MG tablet Take 25 mg by mouth 3 (three) times daily as needed for itching.   Yes Historical Provider, MD  Insulin Glargine (LANTUS SOLOSTAR) 100 UNIT/ML Solostar Pen Inject 20 Units into the skin daily at 10 pm. 04/30/16  Yes Eugenie Filler, MD  metFORMIN (GLUCOPHAGE) 500 MG tablet Take 500 mg by mouth 2 (two) times daily with a meal.   Yes Historical Provider, MD  Multiple Vitamin (MULTIVITAMIN WITH MINERALS) TABS tablet Take 1 tablet by mouth daily.   Yes Historical Provider, MD  potassium chloride SA (K-DUR,KLOR-CON) 20 MEQ tablet Take 1 tablet (20 mEq total) by mouth daily. 05/01/16  Yes Quillian Quince  Durene Cal, MD  albuterol-ipratropium (COMBIVENT) 18-103 MCG/ACT inhaler Inhale 2 puffs into the lungs every 6 (six) hours as needed for shortness of breath. Reported on 04/27/2016    Historical Provider, MD  fluconazole (DIFLUCAN) 100 MG tablet Take 1 tablet (100 mg total) by mouth daily. Take for 5 days then stop. Patient not taking: Reported on 10/01/2016 04/30/16   Eugenie Filler, MD  glimepiride (AMARYL) 4 MG tablet Take 4 mg by mouth daily with breakfast.    Historical Provider, MD  HYDROcodone-acetaminophen (NORCO) 5-325 MG tablet Take 1 tablet by mouth every 6 (six)  hours as needed for moderate pain. 10/01/16   Jaime Pilcher Ward, PA-C  ibuprofen (ADVIL,MOTRIN) 800 MG tablet Take 1 tablet (800 mg total) by mouth every 8 (eight) hours as needed. 10/01/16   Ozella Almond Ward, PA-C    Family History Family History  Problem Relation Age of Onset  . Hypertension Mother   . Cancer Son     Social History Social History  Substance Use Topics  . Smoking status: Never Smoker  . Smokeless tobacco: Never Used  . Alcohol use 0.6 oz/week    1 Cans of beer per week     Comment: 2/day     Allergies   Penicillins; Shellfish allergy; and Amoxicillin   Review of Systems Review of Systems  Constitutional: Negative for fever.  Cardiovascular: Positive for leg swelling.  Musculoskeletal: Positive for arthralgias.  Neurological: Negative for numbness.     Physical Exam Updated Vital Signs BP 158/74 (BP Location: Right Arm)   Pulse 77   Temp 98.7 F (37.1 C) (Oral)   Resp 18   LMP 07/31/2014   SpO2 99%   Physical Exam  Constitutional: She is oriented to person, place, and time. She appears well-developed and well-nourished. No distress.  HENT:  Head: Normocephalic and atraumatic.  Cardiovascular: Normal rate, regular rhythm, normal heart sounds and intact distal pulses.   No murmur heard. Pulmonary/Chest: Effort normal and breath sounds normal. No respiratory distress.  Musculoskeletal:  Right knee: No gross deformity noted. Knee with full ROM. TTP along the medial joint line with swelling. + McMurray's. No abnormal alignment or patellar mobility. No bruising, erythema, or warmth overlaying the joint. No varus/valgus laxity. Negative drawer's, negative Lachman's. 2+ DP pulses bilaterally. All compartments are soft. Sensation intact distal to injury.  Neurological: She is alert and oriented to person, place, and time.  Skin: Skin is warm and dry.  Nursing note and vitals reviewed.    ED Treatments / Results  DIAGNOSTIC STUDIES: Oxygen  Saturation is 98% on RA, normal by my interpretation.    COORDINATION OF CARE: 3:52 PM Discussed treatment plan with pt at bedside which includes x-ray and pt agreed to plan.  Labs (all labs ordered are listed, but only abnormal results are displayed) Labs Reviewed - No data to display  EKG  EKG Interpretation None       Radiology Dg Knee Complete 4 Views Right  Result Date: 10/01/2016 CLINICAL DATA:  Patient fell onto right knee outside 7 days ago, now with pain and swelling involving medial aspect of the right knee. EXAM: RIGHT KNEE - COMPLETE 4+ VIEW COMPARISON:  None. FINDINGS: No fracture or dislocation. 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 No evidence of chondrocalcinosis. Small joint effusion. Regional soft tissues appear normal. IMPRESSION: 1. Small joint effusion.  Otherwise, no acute findings.  2. 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 Electronically Signed   By: Sandi Mariscal M.D.   On: 10/01/2016 15:49    Procedures Procedures (including critical care time)  Medications Ordered in ED Medications  HYDROcodone-acetaminophen (NORCO/VICODIN) 5-325 MG per tablet 1 tablet (not administered)     Initial Impression / Assessment and Plan / ED Course  I have reviewed the triage vital signs and the nursing notes.  Pertinent labs & imaging results that were available during my care of the patient were reviewed by me and considered in my medical decision making (see chart for details).  Clinical Course   Latoya Roth is a 45 y.o. female who presents to ED for right knee pain. She is ambulatory in ED. RLE is NVI. X-ray obtained:   IMPRESSION: 1. Small joint effusion. Otherwise, no acute findings. 2. Moderate tricompartmental degenerative change of the  knee, worse within the lateral compartment.  Ace wrap provided in ED. Crutches offered but patient declined. Rx for ibuprofen and short course pain meds given. Ortho follow up if no improvement.   Reasons to return to ED discussed and all questions answered.    Final Clinical Impressions(s) / ED Diagnoses   Final diagnoses:  Acute pain of right knee   I personally performed the services described in this documentation, which was scribed in my presence. The recorded information has been reviewed and is accurate.   New Prescriptions New Prescriptions   HYDROCODONE-ACETAMINOPHEN (NORCO) 5-325 MG TABLET    Take 1 tablet by mouth every 6 (six) hours as needed for moderate pain.   IBUPROFEN (ADVIL,MOTRIN) 800 MG TABLET    Take 1 tablet (800 mg total) by mouth every 8 (eight) hours as needed.     Ozella Almond Ward, PA-C 10/01/16 [place], MD 10/03/16 [phone]

## 2016-10-01 NOTE — ED Triage Notes (Signed)
Patient complains of right knee pain with ambulation x 1 week following fall, NAD. Alert and oriented

## 2016-10-01 NOTE — ED Notes (Signed)
Declined W/C at D/C and was escorted to lobby by RN. 

## 2016-10-01 NOTE — ED Notes (Signed)
Patient returned from xray.

## 2016-10-01 NOTE — ED Notes (Signed)
Patient transported to X-ray 

## 2017-03-13 ENCOUNTER — Ambulatory Visit (INDEPENDENT_AMBULATORY_CARE_PROVIDER_SITE_OTHER): Payer: Medicaid Other | Admitting: Internal Medicine

## 2017-03-13 ENCOUNTER — Encounter: Payer: Self-pay | Admitting: Internal Medicine

## 2017-03-13 DIAGNOSIS — B20 Human immunodeficiency virus [HIV] disease: Secondary | ICD-10-CM | POA: Diagnosis not present

## 2017-03-13 LAB — COMPLETE METABOLIC PANEL WITH GFR
ALT: 11 U/L (ref 6–29)
AST: 19 U/L (ref 10–35)
Albumin: 3.7 g/dL (ref 3.6–5.1)
Alkaline Phosphatase: 100 U/L (ref 33–115)
BUN: 4 mg/dL — ABNORMAL LOW (ref 7–25)
CHLORIDE: 100 mmol/L (ref 98–110)
CO2: 29 mmol/L (ref 20–31)
Calcium: 8.9 mg/dL (ref 8.6–10.2)
Creat: 0.57 mg/dL (ref 0.50–1.10)
Glucose, Bld: 78 mg/dL (ref 65–99)
Potassium: 3.5 mmol/L (ref 3.5–5.3)
Sodium: 137 mmol/L (ref 135–146)
Total Bilirubin: 0.3 mg/dL (ref 0.2–1.2)
Total Protein: 7.7 g/dL (ref 6.1–8.1)

## 2017-03-13 LAB — LIPID PANEL
CHOL/HDL RATIO: 3.7 ratio (ref <5.0)
CHOLESTEROL: 143 mg/dL (ref <200)
HDL: 39 mg/dL — ABNORMAL LOW (ref >50)
LDL Cholesterol: 74 mg/dL (ref <100)
TRIGLYCERIDES: 150 mg/dL — AB (ref <150)
VLDL: 30 mg/dL (ref <30)

## 2017-03-14 LAB — CBC WITH DIFFERENTIAL/PLATELET
BASOS ABS: 0 {cells}/uL (ref 0–200)
Basophils Relative: 0 %
EOS ABS: 88 {cells}/uL (ref 15–500)
Eosinophils Relative: 2 %
HCT: 35.3 % (ref 35.0–45.0)
Hemoglobin: 11.2 g/dL — ABNORMAL LOW (ref 11.7–15.5)
LYMPHS PCT: 51 %
Lymphs Abs: 2244 cells/uL (ref 850–3900)
MCH: 24.1 pg — AB (ref 27.0–33.0)
MCHC: 31.7 g/dL — ABNORMAL LOW (ref 32.0–36.0)
MCV: 75.9 fL — AB (ref 80.0–100.0)
MONOS PCT: 7 %
MPV: 8.7 fL (ref 7.5–12.5)
Monocytes Absolute: 308 cells/uL (ref 200–950)
NEUTROS PCT: 40 %
Neutro Abs: 1760 cells/uL (ref 1500–7800)
PLATELETS: 342 10*3/uL (ref 140–400)
RBC: 4.65 MIL/uL (ref 3.80–5.10)
RDW: 16.2 % — AB (ref 11.0–15.0)
WBC: 4.4 10*3/uL (ref 3.8–10.8)

## 2017-03-14 LAB — URINALYSIS
Bilirubin Urine: NEGATIVE
GLUCOSE, UA: NEGATIVE
HGB URINE DIPSTICK: NEGATIVE
Ketones, ur: NEGATIVE
Nitrite: NEGATIVE
Protein, ur: NEGATIVE
SPECIFIC GRAVITY, URINE: 1.007 (ref 1.001–1.035)
pH: 7 (ref 5.0–8.0)

## 2017-03-14 LAB — RPR

## 2017-03-14 LAB — T-HELPER CELL (CD4) - (RCID CLINIC ONLY)
CD4 % Helper T Cell: 19 % — ABNORMAL LOW (ref 33–55)
CD4 T CELL ABS: 440 /uL (ref 400–2700)

## 2017-03-14 LAB — HEPATITIS B CORE ANTIBODY, TOTAL: Hep B Core Total Ab: NONREACTIVE

## 2017-03-14 LAB — HEPATITIS B SURFACE ANTIGEN: Hepatitis B Surface Ag: NEGATIVE

## 2017-03-14 LAB — HEPATITIS B SURFACE ANTIBODY,QUALITATIVE: Hep B S Ab: NEGATIVE

## 2017-03-14 LAB — HEPATITIS C ANTIBODY: HCV Ab: NEGATIVE

## 2017-03-14 LAB — HEPATITIS A ANTIBODY, TOTAL: Hep A Total Ab: NONREACTIVE

## 2017-03-14 NOTE — Progress Notes (Signed)
Patient ID: Latoya Roth, female    DOB: 10-17-1971, 46 y.o.   MRN: [id number]  Reason for visit: to establish care as a new patient with HIV  HPI:   Patient was first diagnosed about 2 months ago by the health department.  She was tested as part routine screening.  Previously tested negative about 3 years ago.  The CD4 count is unknown, viral load is unknown.  There have been associated diarrhea with about a 10 lb weight loss in the last week.  She is here today with her female partner who states he recently was tested negative, though partner testing was done here and he is positive.   She is adjusting to the diagnosis.  Possible recent thrush but no opportunistic infections otherwise.  Previous hospitalizations reviewed and no previous testing here.  Had recent pneumovax by her PCP.   Past Medical History:  Diagnosis Date  . Anxiety   . Arthritis   . Asthma   . Depression   . Diabetes mellitus   . Hypertension   . Obesity     Prior to Admission medications   Medication Sig Start Date End Date Taking? Authorizing Provider  albuterol (PROVENTIL HFA;VENTOLIN HFA) 108 (90 BASE) MCG/ACT inhaler Inhale 2 puffs into the lungs every 6 (six) hours as needed for wheezing or shortness of breath.   Yes Historical Provider, MD  albuterol (PROVENTIL) (2.5 MG/3ML) 0.083% nebulizer solution Take 2.5 mg by nebulization every 6 (six) hours as needed. For shortness of breath   Yes Historical Provider, MD  albuterol-ipratropium (COMBIVENT) 18-103 MCG/ACT inhaler Inhale 2 puffs into the lungs every 6 (six) hours as needed for shortness of breath. Reported on 04/27/2016   Yes Historical Provider, MD  budesonide-formoterol (SYMBICORT) 160-4.5 MCG/ACT inhaler Inhale 2 puffs into the lungs 2 (two) times daily.   Yes Historical Provider, MD  cetirizine (ZYRTEC) 10 MG tablet Take 10 mg by mouth daily.   Yes Historical Provider, MD  Insulin Glargine (LANTUS SOLOSTAR) 100 UNIT/ML Solostar Pen Inject 20 Units into  the skin daily at 10 pm. 04/30/16  Yes Eugenie Filler, MD  lisinopril (PRINIVIL,ZESTRIL) 10 MG tablet Take 10 mg by mouth daily.   Yes Historical Provider, MD  metFORMIN (GLUCOPHAGE) 500 MG tablet Take 500 mg by mouth 2 (two) times daily with a meal.   Yes Historical Provider, MD  Multiple Vitamin (MULTIVITAMIN WITH MINERALS) TABS tablet Take 1 tablet by mouth daily.   Yes Historical Provider, MD  potassium chloride SA (K-DUR,KLOR-CON) 20 MEQ tablet Take 1 tablet (20 mEq total) by mouth daily. 05/01/16  Yes Eugenie Filler, MD  glimepiride (AMARYL) 4 MG tablet Take 4 mg by mouth daily with breakfast.    Historical Provider, MD  hydrOXYzine (ATARAX/VISTARIL) 25 MG tablet Take 25 mg by mouth 3 (three) times daily as needed for itching.    Historical Provider, MD    Allergies  Allergen Reactions  . Penicillins Anaphylaxis    Has patient had a PCN reaction causing immediate rash, facial/tongue/throat swelling, SOB or lightheadedness with hypotension: Yes Has patient had a PCN reaction causing severe rash involving mucus membranes or skin necrosis: No Has patient had a PCN reaction that required hospitalization No Has patient had a PCN reaction occurring within the last 10 years: No If all of the above answers are "NO", then may proceed with Cephalosporin use.   . Shellfish Allergy Anaphylaxis, Hives and Swelling  . Amoxicillin Rash    Social History  Substance Use  Topics  . Smoking status: Former Research scientist (life sciences)  . Smokeless tobacco: Never Used  . Alcohol use 8.4 oz/week    14 Cans of beer per week     Comment: 2/day    Family History  Problem Relation Age of Onset  . Hypertension Mother   . Cancer Son      Review of Systems Constitutional: negative for fatigue, malaise and anorexia Gastrointestinal: positive for diarrhea, negative for melena Integument/breast: positive for rash and pruritus Musculoskeletal: negative for myalgias and arthralgias Behavioral/Psych: positive for anxiety  related to the diagnosis All other systems reviewed and are negative    CONSTITUTIONAL:in no apparent distress and alert  Vitals:   03/13/17 1511  BP: (!) 165/83  Pulse: 66  Temp: 98.3 F (36.8 C)   EYES: anicteric HENT: no thrush CARD:Cor RRR RESP:CTA B; normal respiratory effort OR:JGYLU sounds are normal, morbidly obese, nt MS:no pedal edema noted SKIN: some subtle facial rash on bilateral cheeks NEURO: non-focal  No results found for: HIV1RNAQUANT No components found for: HIV1GENOTYPRPLUS No components found for: THELPERCELL  Assessment: new patient here with HIV.  Discussed with patient treatment options and side effects, benefits of treatment, long term outcomes.  I discussed the severity of untreated HIV including higher cancer risk, opportunistic infections, renal failure.  Also discussed needing to use condoms, partner disclosure, necessary vaccines, blood monitoring.  All questions answered.    Plan: 1) labs today 2) partner testing  3) rtc in 2 weeks with me to discuss treatment options, will consider [employer]

## 2017-03-15 LAB — HIV-1 RNA,QN PCR W/REFLEX GENOTYPE
HIV-1 RNA, QN PCR: 5 {Log_copies}/mL — AB
HIV-1 RNA, QN PCR: 99100 Copies/mL — ABNORMAL HIGH

## 2017-03-15 LAB — URINE CYTOLOGY ANCILLARY ONLY
Chlamydia: NEGATIVE
NEISSERIA GONORRHEA: NEGATIVE

## 2017-03-18 LAB — QUANTIFERON TB GOLD ASSAY (BLOOD)
INTERFERON GAMMA RELEASE ASSAY: NEGATIVE
QUANTIFERON NIL VALUE: 0.08 [IU]/mL
Quantiferon Tb Ag Minus Nil Value: 0.02 IU/mL

## 2017-03-21 LAB — HIV-1 GENOTYPR PLUS

## 2017-03-21 LAB — HLA B*5701: HLA-B*5701 w/rflx HLA-B High: NEGATIVE

## 2017-03-25 ENCOUNTER — Encounter: Payer: Self-pay | Admitting: Licensed Clinical Social Worker

## 2017-04-04 ENCOUNTER — Ambulatory Visit (INDEPENDENT_AMBULATORY_CARE_PROVIDER_SITE_OTHER): Payer: Medicaid Other | Admitting: Internal Medicine

## 2017-04-04 ENCOUNTER — Encounter: Payer: Self-pay | Admitting: Internal Medicine

## 2017-04-04 VITALS — BP 153/90 | HR 69 | Temp 98.1°F | Ht 67.0 in | Wt 240.0 lb

## 2017-04-04 DIAGNOSIS — Z21 Asymptomatic human immunodeficiency virus [HIV] infection status: Secondary | ICD-10-CM

## 2017-04-04 DIAGNOSIS — Z5900 Homelessness unspecified: Secondary | ICD-10-CM | POA: Insufficient documentation

## 2017-04-04 DIAGNOSIS — I10 Essential (primary) hypertension: Secondary | ICD-10-CM | POA: Diagnosis not present

## 2017-04-04 DIAGNOSIS — E119 Type 2 diabetes mellitus without complications: Secondary | ICD-10-CM | POA: Insufficient documentation

## 2017-04-04 DIAGNOSIS — B20 Human immunodeficiency virus [HIV] disease: Secondary | ICD-10-CM

## 2017-04-04 MED ORDER — ELVITEG-COBIC-EMTRICIT-TENOFAF 150-150-200-10 MG PO TABS: 1.0000 | ORAL_TABLET | Freq: Every day | ORAL | 5 refills | Status: DC

## 2017-04-04 NOTE — Progress Notes (Signed)
   Subjective:    Patient ID: Latoya Roth, female    DOB: 1970/12/28, 46 y.o.   MRN: [id number]  HPI Here for follow up of HIV  I saw her as a new patient recently diagnosed.  Labs done and CD4 of 440, viral load 99,000, genotype wild type.  No new issues.  She is homeless.  Hepatitis A and B non-immune. Feels ready for treatment.  No weight loss, no diarrhea.  Partner tested positive.  Had pneumovax.    Review of Systems  Constitutional: Negative for activity change and appetite change.  Respiratory: Negative for cough.   Skin: Negative for rash.  Neurological: Negative for dizziness.       Objective:   Physical Exam  Constitutional: She appears well-developed and well-nourished. No distress.  Eyes: No scleral icterus.  Cardiovascular: Normal rate, regular rhythm and normal heart sounds.   No murmur heard. Pulmonary/Chest: No respiratory distress.  Skin: No rash noted.    SH: homeless      Assessment & Plan:

## 2017-04-04 NOTE — Assessment & Plan Note (Signed)
Discussed treatment options and will start her on Genvoya.  Labs in 3-4 weeks then follow up with me.

## 2017-04-04 NOTE — Assessment & Plan Note (Signed)
Will get her plugged in with [facility]

## 2017-04-04 NOTE — Assessment & Plan Note (Signed)
Elevated.  Will continue to monitor and she will discuss with her PCP

## 2017-04-22 ENCOUNTER — Other Ambulatory Visit: Payer: Self-pay | Admitting: Internal Medicine

## 2017-04-22 ENCOUNTER — Other Ambulatory Visit: Payer: Medicaid Other

## 2017-04-22 ENCOUNTER — Other Ambulatory Visit (HOSPITAL_BASED_OUTPATIENT_CLINIC_OR_DEPARTMENT_OTHER): Payer: Self-pay

## 2017-04-22 DIAGNOSIS — B20 Human immunodeficiency virus [HIV] disease: Secondary | ICD-10-CM

## 2017-04-22 DIAGNOSIS — G4730 Sleep apnea, unspecified: Secondary | ICD-10-CM

## 2017-04-22 LAB — COMPREHENSIVE METABOLIC PANEL
AG RATIO: 0.9 ratio — AB (ref 1.0–2.5)
ALK PHOS: 85 U/L (ref 33–115)
ALT: 11 U/L (ref 6–29)
AST: 16 U/L (ref 10–35)
Albumin: 3.6 g/dL (ref 3.6–5.1)
BILIRUBIN TOTAL: 0.3 mg/dL (ref 0.2–1.2)
BUN / CREAT RATIO: 9.4 ratio (ref 6–22)
BUN: 6 mg/dL — AB (ref 7–25)
CO2: 31 mmol/L (ref 20–31)
Calcium: 9.3 mg/dL (ref 8.6–10.2)
Chloride: 96 mmol/L — ABNORMAL LOW (ref 98–110)
Creat: 0.64 mg/dL (ref 0.50–1.10)
GFR, Est African American: >89 mL/min (ref >=60)
GLOBULIN: 4.2 g/dL — AB (ref 1.9–3.7)
GLUCOSE: 182 mg/dL — AB (ref 65–99)
POTASSIUM: 4.3 mmol/L (ref 3.5–5.3)
SODIUM: 136 mmol/L (ref 135–146)
Total Protein: 7.8 g/dL (ref 6.1–8.1)

## 2017-04-22 LAB — COMPLETE METABOLIC PANEL WITH GFR

## 2017-04-22 LAB — CBC WITH DIFFERENTIAL/PLATELET
BASOS ABS: 45 {cells}/uL (ref 0–200)
Basophils Relative: 1 %
EOS ABS: 135 {cells}/uL (ref 15–500)
EOS PCT: 3 %
HCT: 38.7 % (ref 35.0–45.0)
Hemoglobin: 12.3 g/dL (ref 11.7–15.5)
Lymphocytes Relative: 48 %
Lymphs Abs: 2160 cells/uL (ref 850–3900)
MCH: 24.2 pg — AB (ref 27.0–33.0)
MCHC: 31.8 g/dL — AB (ref 32.0–36.0)
MCV: 76 fL — AB (ref 80.0–100.0)
MONOS PCT: 8 %
MPV: 8.6 fL (ref 7.5–12.5)
Monocytes Absolute: 360 cells/uL (ref 200–950)
NEUTROS PCT: 40 %
Neutro Abs: 1800 cells/uL (ref 1500–7800)
PLATELETS: 356 10*3/uL (ref 140–400)
RBC: 5.09 MIL/uL (ref 3.80–5.10)
RDW: 15.2 % — ABNORMAL HIGH (ref 11.0–15.0)
WBC: 4.5 10*3/uL (ref 3.8–10.8)

## 2017-04-22 LAB — CMP 10231
AG RATIO: 0.9 ratio — AB (ref 1.0–2.5)
ALBUMIN: 3.6 g/dL (ref 3.6–5.1)
ALK PHOS: 85 U/L (ref 33–115)
ALT: 11 U/L (ref 6–29)
AST: 16 U/L (ref 10–35)
BUN/Creatinine Ratio: 9.4 Ratio (ref 6–22)
BUN: 6 mg/dL — AB (ref 7–25)
CO2: 31 mmol/L (ref 20–31)
Calcium: 9.3 mg/dL (ref 8.6–10.2)
Chloride: 96 mmol/L — ABNORMAL LOW (ref 98–110)
Creat: 0.64 mg/dL (ref 0.50–1.10)
GFR, Est African American: >89 mL/min (ref >=60)
GFR, Est Non African American: >89 mL/min (ref >=60)
Globulin: 4.2 g/dL — ABNORMAL HIGH (ref 1.9–3.7)
Glucose, Bld: 182 mg/dL — ABNORMAL HIGH (ref 65–99)
POTASSIUM: 4.3 mmol/L (ref 3.5–5.3)
SODIUM: 136 mmol/L (ref 135–146)
TOTAL PROTEIN: 7.8 g/dL (ref 6.1–8.1)
Total Bilirubin: 0.3 mg/dL (ref 0.2–1.2)

## 2017-04-22 MED ORDER — ONDANSETRON HCL 4 MG PO TABS: 4.0000 mg | ORAL_TABLET | Freq: Every day | ORAL | 1 refills | Status: DC | PRN

## 2017-04-23 LAB — T-HELPER CELL (CD4) - (RCID CLINIC ONLY)
CD4 % Helper T Cell: 22 % — ABNORMAL LOW (ref 33–55)
CD4 T CELL ABS: 450 /uL (ref 400–2700)

## 2017-04-24 LAB — HIV-1 RNA QUANT-NO REFLEX-BLD
HIV 1 RNA Quant: 181 copies/mL — ABNORMAL HIGH
HIV-1 RNA Quant, Log: 2.26 Log copies/mL — ABNORMAL HIGH

## 2017-05-06 ENCOUNTER — Ambulatory Visit (INDEPENDENT_AMBULATORY_CARE_PROVIDER_SITE_OTHER): Payer: Medicaid Other | Admitting: Internal Medicine

## 2017-05-06 ENCOUNTER — Encounter: Payer: Self-pay | Admitting: Internal Medicine

## 2017-05-06 VITALS — BP 148/87 | HR 75 | Temp 98.0°F | Ht 69.0 in | Wt 239.0 lb

## 2017-05-06 DIAGNOSIS — J4520 Mild intermittent asthma, uncomplicated: Secondary | ICD-10-CM | POA: Diagnosis not present

## 2017-05-06 DIAGNOSIS — R110 Nausea: Secondary | ICD-10-CM

## 2017-05-06 DIAGNOSIS — Z5181 Encounter for therapeutic drug level monitoring: Secondary | ICD-10-CM | POA: Diagnosis not present

## 2017-05-06 DIAGNOSIS — Z23 Encounter for immunization: Secondary | ICD-10-CM

## 2017-05-06 DIAGNOSIS — Z21 Asymptomatic human immunodeficiency virus [HIV] infection status: Secondary | ICD-10-CM

## 2017-05-06 DIAGNOSIS — B20 Human immunodeficiency virus [HIV] disease: Secondary | ICD-10-CM | POA: Diagnosis not present

## 2017-05-06 NOTE — Assessment & Plan Note (Signed)
Helped with zofran. May be partly post tussive emesis.  Continue with zofran to assure she is taking the [place] daily

## 2017-05-06 NOTE — Progress Notes (Signed)
   Subjective:    Patient ID: Latoya Roth, female    DOB: 10/18/1971, 46 y.o.   MRN: [id number]  HPI Here for follow up of HIV.  Started on [place] and has had some nausea relieved with zofran.  She has had a cough recently with URI symptoms but no missed doses.  Her CD4 count is 450 and viral load down from 99,000 to 181.  No other new issues.  No weight loss, no diarrhea.  No associated rash.  Asking if ok to drink occasional alcohol with medication.  Hepatitis A and B immune.     Review of Systems  Constitutional: Negative for chills, fatigue and fever.  Respiratory: Positive for cough. Negative for shortness of breath.   Gastrointestinal: Positive for nausea.  Skin: Negative for rash.  Neurological: Negative for dizziness.       Objective:   Physical Exam  Constitutional: She appears well-developed and well-nourished. No distress.  HENT:  Mouth/Throat: No oropharyngeal exudate.  Eyes: No scleral icterus.  Cardiovascular: Normal rate, regular rhythm and normal heart sounds.   No murmur heard. Pulmonary/Chest: Effort normal and breath sounds normal. No respiratory distress.  Lymphadenopathy:    She has no cervical adenopathy.  Skin: No rash noted.    SH: occasional alcohol but none since diagnosis      Assessment & Plan:

## 2017-05-06 NOTE — Assessment & Plan Note (Addendum)
Doing well and suppressing virus.  rtc 3 months with labs prior

## 2017-05-06 NOTE — Assessment & Plan Note (Signed)
No issues with creat, LFTs.

## 2017-05-06 NOTE — Assessment & Plan Note (Signed)
Some cough now but no sob and no fever.  Exam reassuring

## 2017-05-06 NOTE — Addendum Note (Signed)
Addended by: Roma Kayser on: 05/06/2017 04:34 PM   Modules accepted: Orders

## 2017-05-17 ENCOUNTER — Other Ambulatory Visit: Payer: Self-pay

## 2017-05-17 ENCOUNTER — Encounter (HOSPITAL_COMMUNITY): Payer: Self-pay | Admitting: Emergency Medicine

## 2017-05-17 ENCOUNTER — Emergency Department (HOSPITAL_COMMUNITY)
Admission: EM | Admit: 2017-05-17 | Discharge: 2017-05-17 | Disposition: A | Discharge status: Home / Self Care | Payer: Medicaid Other | Attending: Emergency Medicine | Admitting: Emergency Medicine

## 2017-05-17 ENCOUNTER — Emergency Department (HOSPITAL_COMMUNITY): Payer: Medicaid Other

## 2017-05-17 DIAGNOSIS — J4521 Mild intermittent asthma with (acute) exacerbation: Secondary | ICD-10-CM | POA: Diagnosis not present

## 2017-05-17 DIAGNOSIS — R0789 Other chest pain: Secondary | ICD-10-CM | POA: Diagnosis not present

## 2017-05-17 DIAGNOSIS — Z87891 Personal history of nicotine dependence: Secondary | ICD-10-CM | POA: Diagnosis not present

## 2017-05-17 DIAGNOSIS — E1165 Type 2 diabetes mellitus with hyperglycemia: Secondary | ICD-10-CM | POA: Diagnosis not present

## 2017-05-17 DIAGNOSIS — B9789 Other viral agents as the cause of diseases classified elsewhere: Secondary | ICD-10-CM

## 2017-05-17 DIAGNOSIS — R739 Hyperglycemia, unspecified: Secondary | ICD-10-CM

## 2017-05-17 DIAGNOSIS — Z794 Long term (current) use of insulin: Secondary | ICD-10-CM | POA: Diagnosis not present

## 2017-05-17 DIAGNOSIS — I10 Essential (primary) hypertension: Secondary | ICD-10-CM | POA: Diagnosis not present

## 2017-05-17 DIAGNOSIS — Z79899 Other long term (current) drug therapy: Secondary | ICD-10-CM | POA: Diagnosis not present

## 2017-05-17 DIAGNOSIS — J069 Acute upper respiratory infection, unspecified: Secondary | ICD-10-CM | POA: Diagnosis not present

## 2017-05-17 DIAGNOSIS — R05 Cough: Secondary | ICD-10-CM | POA: Diagnosis present

## 2017-05-17 HISTORY — DX: Human immunodeficiency virus (HIV) disease: B20

## 2017-05-17 HISTORY — DX: Bell's palsy: G51.0

## 2017-05-17 HISTORY — DX: Acute vaginitis: B96.89

## 2017-05-17 HISTORY — DX: Other specified bacterial agents as the cause of diseases classified elsewhere: N76.0

## 2017-05-17 LAB — I-STAT TROPONIN, ED: Troponin i, poc: 0 ng/mL (ref 0.00–0.08)

## 2017-05-17 LAB — CBC
HCT: 35.2 % — ABNORMAL LOW (ref 36.0–46.0)
HEMOGLOBIN: 11.3 g/dL — AB (ref 12.0–15.0)
MCH: 24.6 pg — ABNORMAL LOW (ref 26.0–34.0)
MCHC: 32.1 g/dL (ref 30.0–36.0)
MCV: 76.7 fL — AB (ref 78.0–100.0)
Platelets: 364 10*3/uL (ref 150–400)
RBC: 4.59 MIL/uL (ref 3.87–5.11)
RDW: 14.4 % (ref 11.5–15.5)
WBC: 5.7 10*3/uL (ref 4.0–10.5)

## 2017-05-17 LAB — BASIC METABOLIC PANEL
Anion gap: 8 (ref 5–15)
BUN: 10 mg/dL (ref 6–20)
CHLORIDE: 99 mmol/L — AB (ref 101–111)
CO2: 26 mmol/L (ref 22–32)
Calcium: 8.8 mg/dL — ABNORMAL LOW (ref 8.9–10.3)
Creatinine, Ser: 0.74 mg/dL (ref 0.44–1.00)
GFR calc Af Amer: >60 mL/min (ref >60)
GFR calc non Af Amer: >60 mL/min (ref >60)
Glucose, Bld: 268 mg/dL — ABNORMAL HIGH (ref 65–99)
POTASSIUM: 3.6 mmol/L (ref 3.5–5.1)
SODIUM: 133 mmol/L — AB (ref 135–145)

## 2017-05-17 MED ORDER — GUAIFENESIN-CODEINE 100-10 MG/5ML PO SOLN: 10.0000 mL | Freq: Four times a day (QID) | ORAL | 0 refills | Status: DC | PRN

## 2017-05-17 MED ORDER — GUAIFENESIN-CODEINE 100-10 MG/5ML PO SOLN
10.0000 mL | Freq: Once | ORAL | Status: AC
  Administered 2017-05-17: 10 mL via ORAL
  Filled 2017-05-17: qty 10

## 2017-05-17 MED ORDER — ACETAMINOPHEN 500 MG PO TABS
1000.0000 mg | ORAL_TABLET | Freq: Once | ORAL | Status: AC
  Administered 2017-05-17: 1000 mg via ORAL
  Filled 2017-05-17: qty 2

## 2017-05-17 MED ORDER — IPRATROPIUM-ALBUTEROL 0.5-2.5 (3) MG/3ML IN SOLN
3.0000 mL | Freq: Once | RESPIRATORY_TRACT | Status: AC
  Administered 2017-05-17: 3 mL via RESPIRATORY_TRACT
  Filled 2017-05-17: qty 3

## 2017-05-17 MED ORDER — PREDNISONE 20 MG PO TABS: 60.0000 mg | ORAL_TABLET | Freq: Every day | ORAL | 0 refills | Status: DC

## 2017-05-17 MED ORDER — PREDNISONE 20 MG PO TABS
60.0000 mg | ORAL_TABLET | Freq: Once | ORAL | Status: AC
  Administered 2017-05-17: 60 mg via ORAL
  Filled 2017-05-17: qty 3

## 2017-05-17 MED ORDER — ALBUTEROL SULFATE HFA 108 (90 BASE) MCG/ACT IN AERS
2.0000 | INHALATION_SPRAY | Freq: Once | RESPIRATORY_TRACT | Status: AC
  Administered 2017-05-17: 2 via RESPIRATORY_TRACT
  Filled 2017-05-17: qty 6.7

## 2017-05-17 NOTE — ED Provider Notes (Signed)
TIME SEEN: 4:44 AM  CHIEF COMPLAINT: Chest pain  HPI: Patient is a 46 year old female with history of hypertension, diabetes, HIV, asthma who presents the emergency department with complaints of 3 days of cough and achy chest pain with coughing. She is not a smoker. States she has been wheezing. No fevers, chills. No nausea or vomiting. Has had diarrhea. No shortness of breath. No lower extremity swelling or pain.  Last viral load was 99,000 and a CD4 count of 450. She is being followed by infectious disease.  ROS: See HPI Constitutional: no fever  Eyes: no drainage  ENT: no runny nose   Cardiovascular:   chest pain with coughing Resp: no SOB  GI: no vomiting GU: no dysuria Integumentary: no rash  Allergy: no hives  Musculoskeletal: no leg swelling  Neurological: no slurred speech ROS otherwise negative  PAST MEDICAL HISTORY/PAST SURGICAL HISTORY:  Past Medical History:  Diagnosis Date  . Anxiety   . Arthritis   . Asthma   . Bell's palsy   . BV (bacterial vaginosis)   . Depression   . Diabetes mellitus   . HIV disease ([place])   . Hypertension   . Obesity     MEDICATIONS:  Prior to Admission medications   Medication Sig Start Date End Date Taking? Authorizing Provider  albuterol (PROVENTIL HFA;VENTOLIN HFA) 108 (90 BASE) MCG/ACT inhaler Inhale 2 puffs into the lungs every 6 (six) hours as needed for wheezing or shortness of breath.    [provider]  albuterol (PROVENTIL) (2.5 MG/3ML) 0.083% nebulizer solution Take 2.5 mg by nebulization every 6 (six) hours as needed. For shortness of breath    [provider]  albuterol-ipratropium (COMBIVENT) 18-103 MCG/ACT inhaler Inhale 2 puffs into the lungs every 6 (six) hours as needed for shortness of breath. Reported on 04/27/2016    [provider]  budesonide-formoterol (SYMBICORT) 160-4.5 MCG/ACT inhaler Inhale 2 puffs into the lungs 2 (two) times daily.    [provider]  cetirizine (ZYRTEC) 10  MG tablet Take 10 mg by mouth daily.    [provider]  elvitegravir-cobicistat-emtricitabine-tenofovir (GENVOYA) 150-150-200-10 MG TABS tablet Take 1 tablet by mouth daily. 04/04/17   Comer, Okey Regal, MD  glimepiride (AMARYL) 4 MG tablet Take 4 mg by mouth daily with breakfast.    [provider]  hydrOXYzine (ATARAX/VISTARIL) 25 MG tablet Take 25 mg by mouth 3 (three) times daily as needed for itching.    [provider]  Insulin Glargine (LANTUS SOLOSTAR) 100 UNIT/ML Solostar Pen Inject 20 Units into the skin daily at 10 pm. 04/30/16   Eugenie Filler, MD  lisinopril (PRINIVIL,ZESTRIL) 10 MG tablet Take 10 mg by mouth daily.    [provider]  metFORMIN (GLUCOPHAGE) 500 MG tablet Take 500 mg by mouth 2 (two) times daily with a meal.    [provider]  Multiple Vitamin (MULTIVITAMIN WITH MINERALS) TABS tablet Take 1 tablet by mouth daily.    [provider]  ondansetron (ZOFRAN) 4 MG tablet Take 1 tablet (4 mg total) by mouth daily as needed for nausea or vomiting (30 minutes prior to [facility] as needed). 04/22/17   Comer, Okey Regal, MD  potassium chloride SA (K-DUR,KLOR-CON) 20 MEQ tablet Take 1 tablet (20 mEq total) by mouth daily. 05/01/16   Eugenie Filler, MD    ALLERGIES:  Allergies  Allergen Reactions  . Penicillins Anaphylaxis    Has patient had a PCN reaction causing immediate rash, facial/tongue/throat swelling, SOB or  lightheadedness with hypotension: Yes Has patient had a PCN reaction causing severe rash involving mucus membranes or skin necrosis: No Has patient had a PCN reaction that required hospitalization No Has patient had a PCN reaction occurring within the last 10 years: No If all of the above answers are "NO", then may proceed with Cephalosporin use.   . Shellfish Allergy Anaphylaxis, Hives and Swelling  . Amoxicillin Rash    SOCIAL HISTORY:  Social History  Substance Use Topics  . Smoking status: Former Research scientist (life sciences)   . Smokeless tobacco: Never Used  . Alcohol use 8.4 oz/week    14 Cans of beer per week     Comment: 2/day    FAMILY HISTORY: Family History  Problem Relation Age of Onset  . Hypertension Mother   . Cancer Son     EXAM: BP (!) 141/71 (BP Location: Right Arm)   Pulse 81   Temp 98.9 F (37.2 C) (Oral)   Resp 18   SpO2 93%  CONSTITUTIONAL: Alert and oriented and responds appropriately to questions. Well-appearing; well-nourished, Afebrile, nontoxic-appearing HEAD: Normocephalic EYES: Conjunctivae clear, pupils appear equal, EOMI ENT: normal nose; moist mucous membranes NECK: Supple, no meningismus, no nuchal rigidity, no LAD  CARD: RRR; S1 and S2 appreciated; no murmurs, no clicks, no rubs, no gallops RESP: Normal chest excursion without splinting or tachypnea; breath sounds clear and equal bilaterally; no wheezes, no rhonchi, no rales, no hypoxia or respiratory distress, speaking full sentences ABD/GI: Normal bowel sounds; non-distended; soft, non-tender, no rebound, no guarding, no peritoneal signs, no hepatosplenomegaly BACK:  The back appears normal and is non-tender to palpation, there is no CVA tenderness EXT: Normal ROM in all joints; non-tender to palpation; no edema; normal capillary refill; no cyanosis, no calf tenderness or swelling    SKIN: Normal color for age and race; warm; no rash NEURO: Moves all extremities equally PSYCH: The patient's mood and manner are appropriate. Grooming and personal hygiene are appropriate.  MEDICAL DECISION MAKING: Patient here with chest pain with cough. Atypical and I doubt ACS, PE or dissection. Troponin negative. EKG shows no new ischemic abnormality. Chest x-ray is clear showing no pneumonia, edema, pneumothorax. She reports she has had some wheezing. Her lungs are currently clear and she feels her breathing treatment would be helpful. We'll give DuoNeb, guaifenesin with codeine, prednisone. Laceration mild hyperglycemia without DKA. She  states she has taken prednisone before and done fine. She agrees to monitor her blood sugar very closely at home.  ED PROGRESS: 6:00 AM  Pt Reports feeling like the breathing treatment made her feel significantly better. Lungs are still clear to auscultation without hypoxia. I feel she is safe to be discharged home. Given an albuterol inhaler to take home with her. We'll discharge with prednisone burst, guaifenesin with codeine. Recommended alternating Tylenol and Motrin as needed for fever and pain. Discussed return precautions. Discussed supportive care instructions.   At this time, I do not feel there is any life-threatening condition present. I have reviewed and discussed all results (EKG, imaging, lab, urine as appropriate) and exam findings with patient/family. I have reviewed nursing notes and appropriate previous records.  I feel the patient is safe to be discharged home without further emergent workup and can continue workup as an outpatient as needed. Discussed usual and customary return precautions. Patient/family verbalize understanding and are comfortable with this plan.  Outpatient follow-up has been provided if needed. All questions have been answered.    EKG Interpretation  Date/Time:  Friday  May 17 2017 00:08:18 EDT Ventricular Rate:  82 PR Interval:  138 QRS Duration: 96 QT Interval:  398 QTC Calculation: 464 R Axis:   15 Text Interpretation:  Normal sinus rhythm Nonspecific ST abnormality Abnormal ECG No significant change since last tracing Confirmed by WARD,  DO, KRISTEN ([id number]) on 05/17/2017 4:39:59 AM         Ward, Delice Bison, DO 05/17/17 0559

## 2017-05-17 NOTE — ED Triage Notes (Signed)
Patient reports intermittent central chest pain with SOB , productive cough onset this week  , mild nausea with no diaphoresis , denies fever or chills .

## 2017-05-17 NOTE — Discharge Instructions (Signed)
You may alternate Tylenol 1000 mg every 6 hours as needed for pain and Ibuprofen 800 mg every 8 hours as needed for pain.  Please take Ibuprofen with food. ° °

## 2017-06-16 ENCOUNTER — Ambulatory Visit (HOSPITAL_BASED_OUTPATIENT_CLINIC_OR_DEPARTMENT_OTHER): Payer: Medicaid Other | Attending: Physician Assistant

## 2017-06-18 ENCOUNTER — Other Ambulatory Visit: Payer: Self-pay | Admitting: Internal Medicine

## 2017-07-30 ENCOUNTER — Other Ambulatory Visit: Payer: Medicaid Other

## 2017-07-31 ENCOUNTER — Other Ambulatory Visit: Payer: Medicaid Other

## 2017-08-05 ENCOUNTER — Other Ambulatory Visit: Payer: Medicaid Other

## 2017-08-05 DIAGNOSIS — B20 Human immunodeficiency virus [HIV] disease: Secondary | ICD-10-CM

## 2017-08-06 LAB — T-HELPER CELL (CD4) - (RCID CLINIC ONLY)
CD4 % Helper T Cell: 23 % — ABNORMAL LOW (ref 33–55)
CD4 T Cell Abs: 570 /uL (ref 400–2700)

## 2017-08-08 LAB — HIV-1 RNA QUANT-NO REFLEX-BLD
HIV 1 RNA Quant: 32 copies/mL — ABNORMAL HIGH
HIV-1 RNA Quant, Log: 1.51 Log copies/mL — ABNORMAL HIGH

## 2017-08-12 ENCOUNTER — Other Ambulatory Visit: Payer: Self-pay | Admitting: Internal Medicine

## 2017-08-13 ENCOUNTER — Ambulatory Visit (INDEPENDENT_AMBULATORY_CARE_PROVIDER_SITE_OTHER): Payer: Medicaid Other | Admitting: Internal Medicine

## 2017-08-13 ENCOUNTER — Encounter: Payer: Self-pay | Admitting: Internal Medicine

## 2017-08-13 VITALS — BP 131/86 | HR 85 | Temp 97.9°F | Ht 67.0 in | Wt 244.0 lb

## 2017-08-13 DIAGNOSIS — R110 Nausea: Secondary | ICD-10-CM

## 2017-08-13 DIAGNOSIS — B20 Human immunodeficiency virus [HIV] disease: Secondary | ICD-10-CM

## 2017-08-13 MED ORDER — BICTEGRAVIR-EMTRICITAB-TENOFOV 50-200-25 MG PO TABS: 1.0000 | ORAL_TABLET | Freq: Every day | ORAL | 11 refills | Status: DC

## 2017-08-13 NOTE — Progress Notes (Signed)
   Subjective:    Patient ID: Latoya Roth, female    DOB: 02-04-1971, 46 y.o.   MRN: [id number]  HPI Here for follow up of HIV. Has been on Genvoya and denies any missed doses.  CD4 570 and viral load suppressed at 32 copies.  No new complaints.     Review of Systems  Constitutional: Negative for fatigue.  Gastrointestinal: Negative for diarrhea.  Skin: Negative for rash.       Objective:   Physical Exam  Constitutional: She appears well-developed and well-nourished. No distress.  Eyes: No scleral icterus.  Cardiovascular: Normal rate, regular rhythm and normal heart sounds.   No murmur heard. Pulmonary/Chest: Effort normal and breath sounds normal. No respiratory distress.  Lymphadenopathy:    She has no cervical adenopathy.  Skin: No rash noted.          Assessment & Plan:

## 2017-08-13 NOTE — Assessment & Plan Note (Signed)
resolved 

## 2017-08-13 NOTE — Assessment & Plan Note (Addendum)
Doing well.  rtc 4 months.  Will change to [facility] with next refill due to multiple drugs and potential interactions.  Hepatitis A and B next visit.

## 2017-08-15 ENCOUNTER — Encounter: Payer: Self-pay | Admitting: Internal Medicine

## 2017-10-17 ENCOUNTER — Other Ambulatory Visit: Payer: Self-pay | Admitting: Internal Medicine

## 2017-10-22 IMAGING — DX DG CHEST 2V
2 series · 2 of 2 positions shown · non-contrast
Comparison: 06/08/2015

CLINICAL DATA: Weakness

EXAM:
CHEST  2 VIEW

[chest pa]
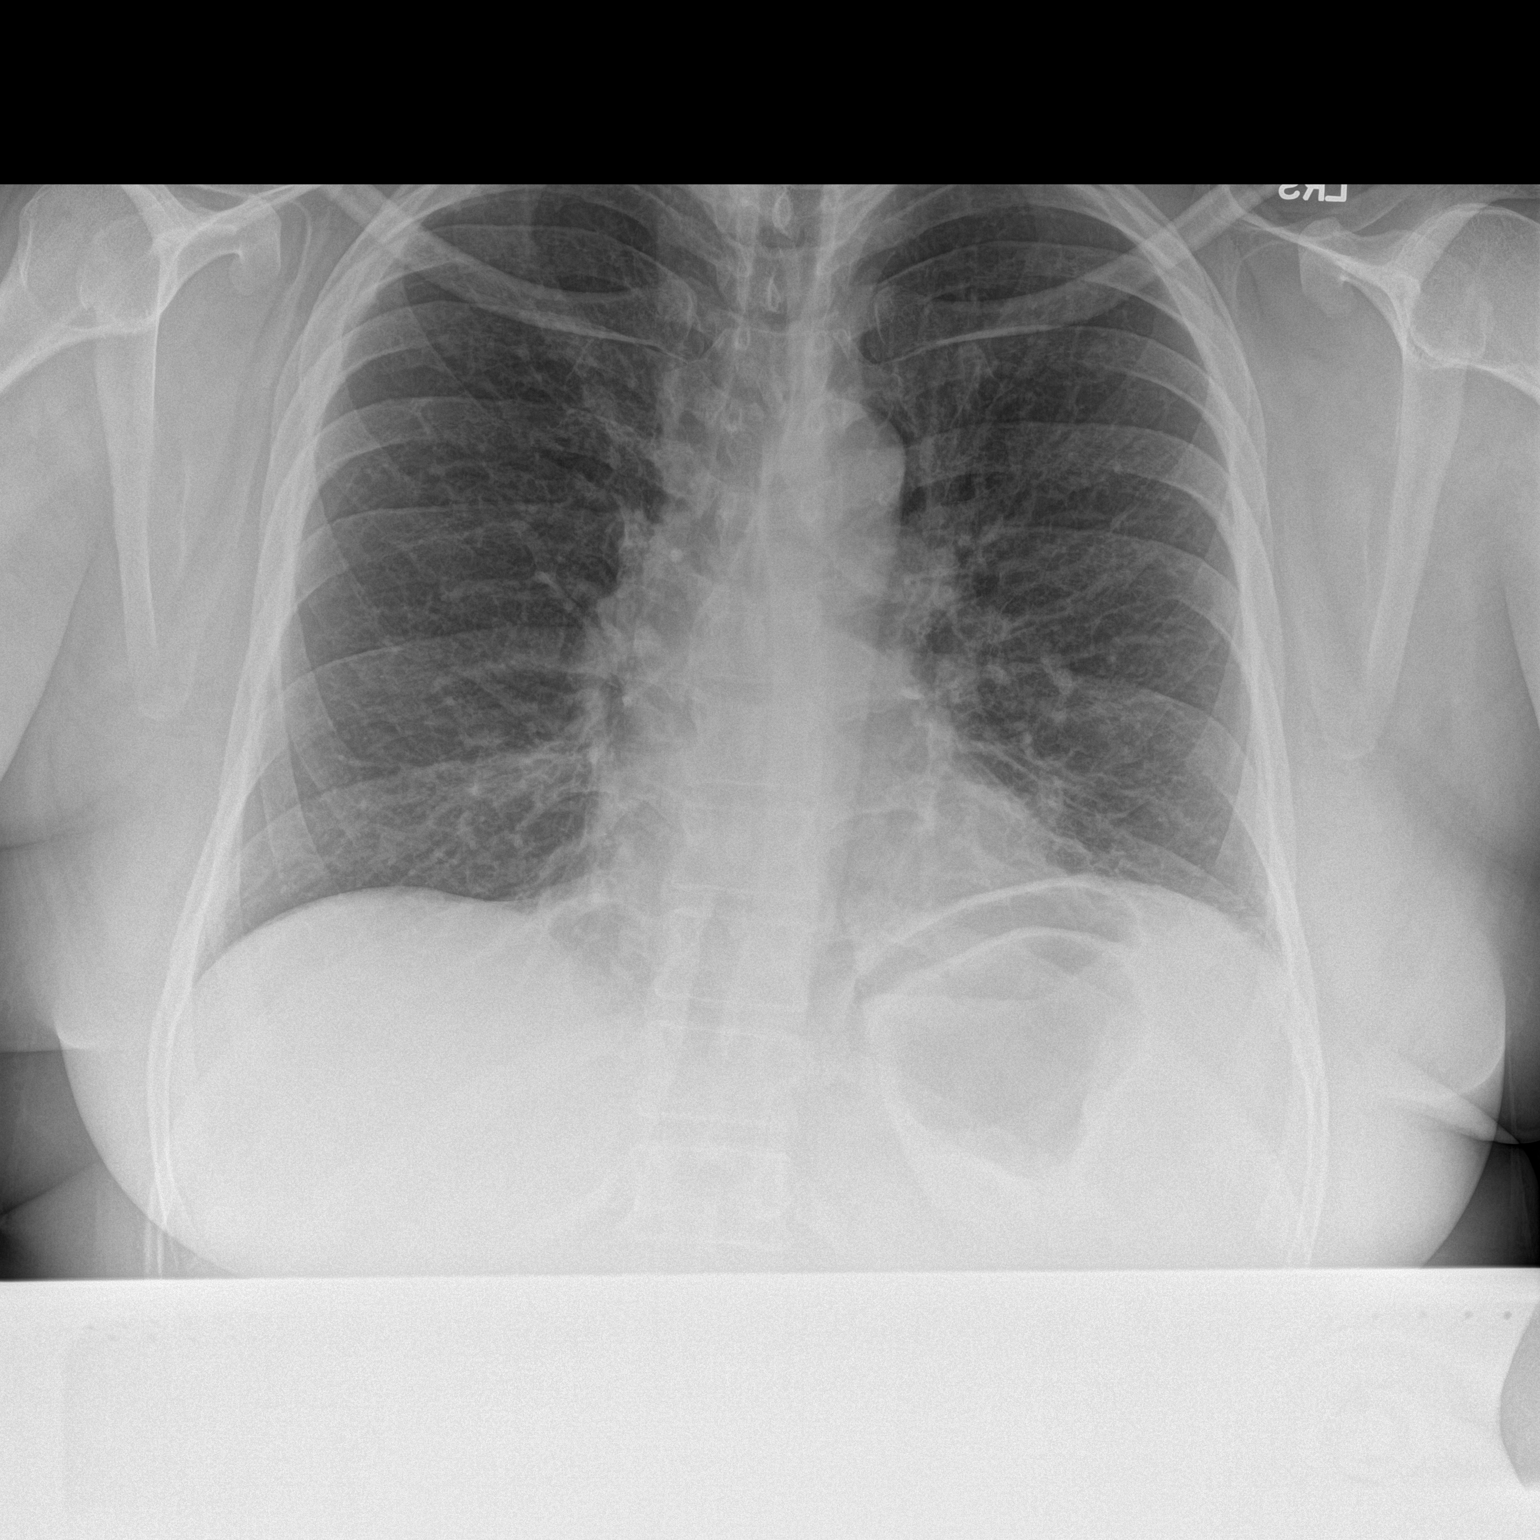

[chest lat]
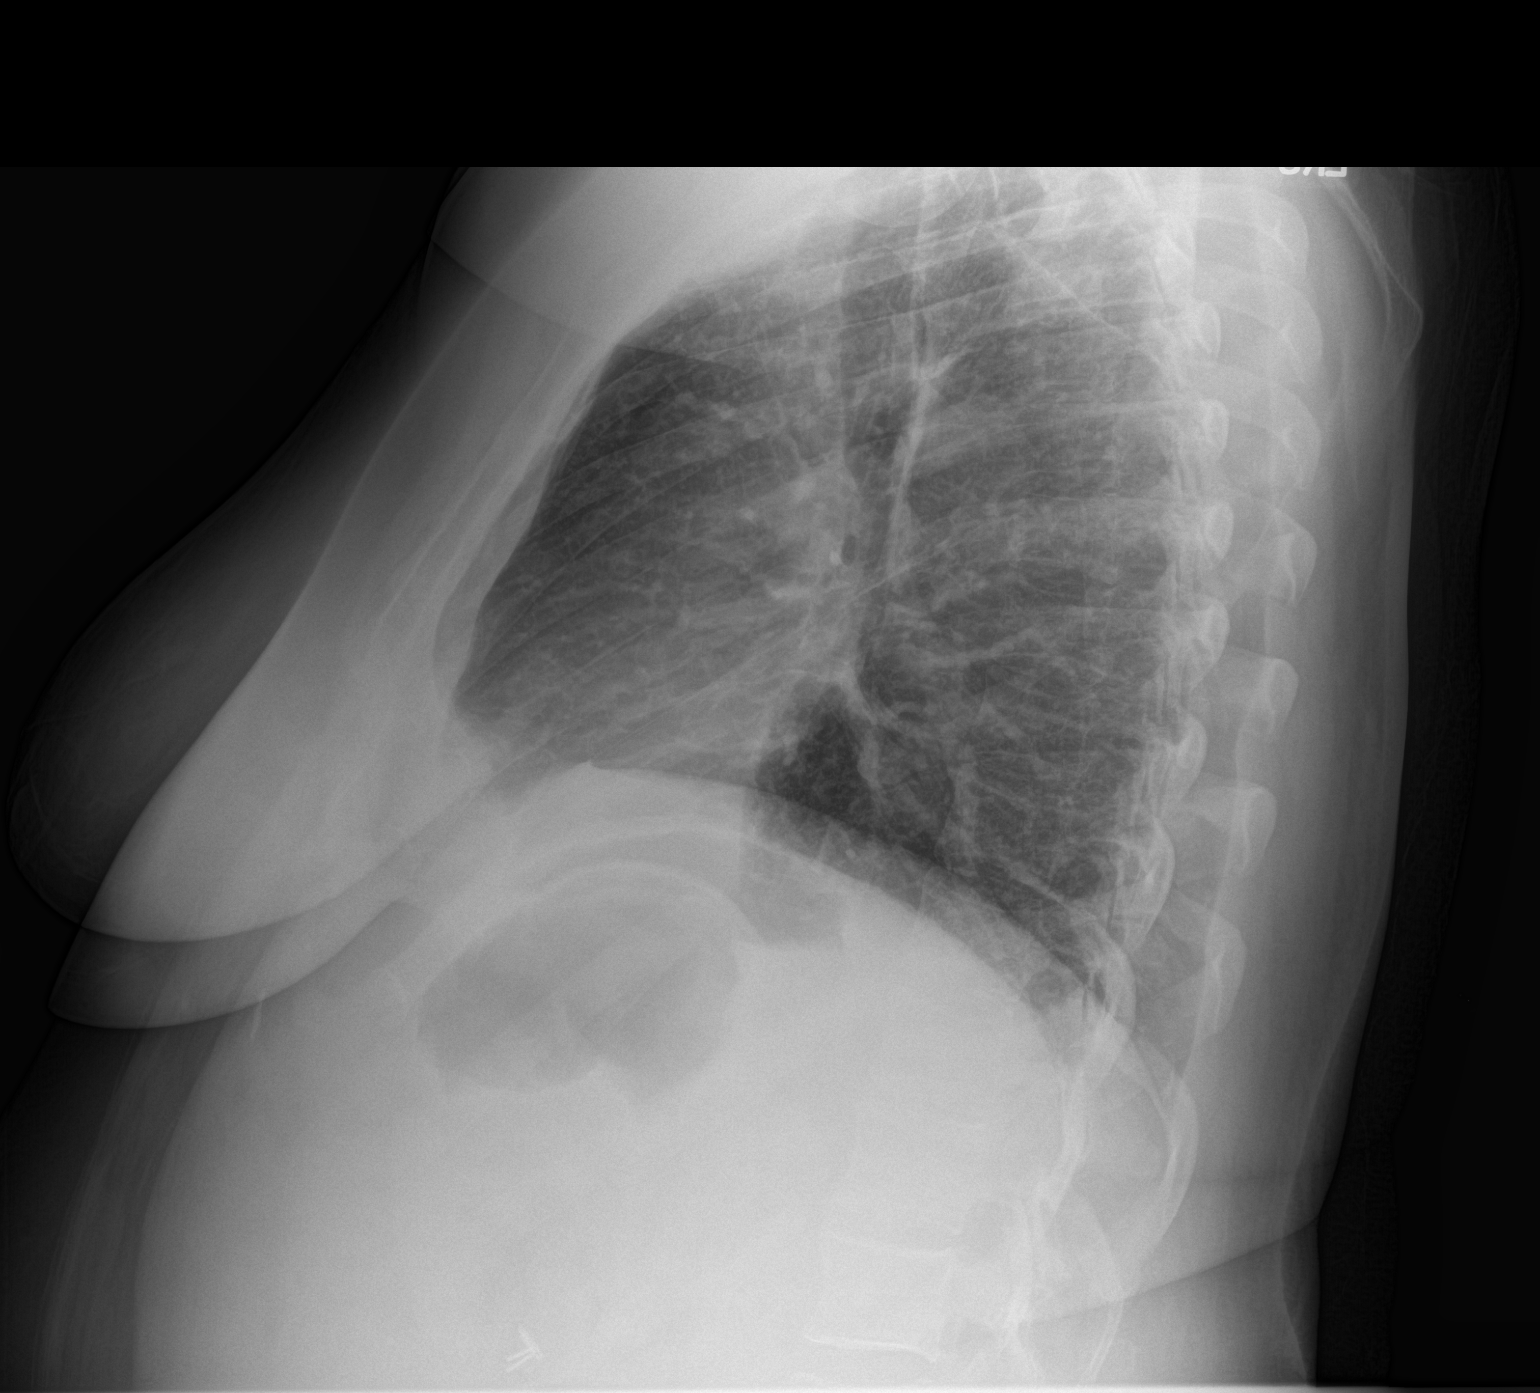

[2 of 2 positions shown; findings below may reference images not displayed]

FINDINGS: Cardiac shadow is within normal limits. The lungs are well aerated
bilaterally. No focal infiltrate or sizable effusion is seen. Mild
chronic interstitial changes are again noted. No bony abnormality is
seen.
IMPRESSION: No active cardiopulmonary disease.

## 2017-12-11 ENCOUNTER — Other Ambulatory Visit: Payer: Self-pay | Admitting: Internal Medicine

## 2017-12-19 ENCOUNTER — Other Ambulatory Visit: Payer: Medicaid Other

## 2017-12-19 DIAGNOSIS — B20 Human immunodeficiency virus [HIV] disease: Secondary | ICD-10-CM

## 2017-12-19 LAB — COMPLETE METABOLIC PANEL WITH GFR
AG Ratio: 1.2 (calc) (ref 1.0–2.5)
ALBUMIN MSPROF: 3.8 g/dL (ref 3.6–5.1)
ALT: 8 U/L (ref 6–29)
AST: 13 U/L (ref 10–35)
Alkaline phosphatase (APISO): 85 U/L (ref 33–115)
BUN / CREAT RATIO: 10 (calc) (ref 6–22)
BUN: 6 mg/dL — AB (ref 7–25)
CO2: 29 mmol/L (ref 20–32)
CREATININE: 0.62 mg/dL (ref 0.50–1.10)
Calcium: 9.1 mg/dL (ref 8.6–10.2)
Chloride: 100 mmol/L (ref 98–110)
GFR, EST AFRICAN AMERICAN: 124 mL/min/{1.73_m2} (ref >=60)
GFR, Est Non African American: 107 mL/min/{1.73_m2} (ref >=60)
GLUCOSE: 187 mg/dL — AB (ref 65–99)
Globulin: 3.1 g/dL (calc) (ref 1.9–3.7)
Potassium: 4.2 mmol/L (ref 3.5–5.3)
Sodium: 136 mmol/L (ref 135–146)
TOTAL PROTEIN: 6.9 g/dL (ref 6.1–8.1)
Total Bilirubin: 0.3 mg/dL (ref 0.2–1.2)

## 2017-12-20 LAB — T-HELPER CELL (CD4) - (RCID CLINIC ONLY)
CD4 % Helper T Cell: 26 % — ABNORMAL LOW (ref 33–55)
CD4 T Cell Abs: 470 /uL (ref 400–2700)

## 2017-12-21 LAB — HIV-1 RNA QUANT-NO REFLEX-BLD
HIV 1 RNA Quant: 27 copies/mL — ABNORMAL HIGH
HIV-1 RNA Quant, Log: 1.43 Log copies/mL — ABNORMAL HIGH

## 2017-12-31 ENCOUNTER — Ambulatory Visit (INDEPENDENT_AMBULATORY_CARE_PROVIDER_SITE_OTHER): Payer: Medicaid Other | Admitting: Internal Medicine

## 2017-12-31 ENCOUNTER — Encounter: Payer: Self-pay | Admitting: Internal Medicine

## 2017-12-31 DIAGNOSIS — Z79899 Other long term (current) drug therapy: Secondary | ICD-10-CM | POA: Diagnosis not present

## 2017-12-31 DIAGNOSIS — Z21 Asymptomatic human immunodeficiency virus [HIV] infection status: Secondary | ICD-10-CM

## 2017-12-31 DIAGNOSIS — Z113 Encounter for screening for infections with a predominantly sexual mode of transmission: Secondary | ICD-10-CM

## 2017-12-31 DIAGNOSIS — B20 Human immunodeficiency virus [HIV] disease: Secondary | ICD-10-CM | POA: Diagnosis not present

## 2017-12-31 DIAGNOSIS — Z5181 Encounter for therapeutic drug level monitoring: Secondary | ICD-10-CM

## 2017-12-31 DIAGNOSIS — Z7189 Other specified counseling: Secondary | ICD-10-CM | POA: Diagnosis not present

## 2017-12-31 DIAGNOSIS — Z23 Encounter for immunization: Secondary | ICD-10-CM | POA: Diagnosis not present

## 2017-12-31 DIAGNOSIS — Z7185 Encounter for immunization safety counseling: Secondary | ICD-10-CM

## 2017-12-31 NOTE — Assessment & Plan Note (Signed)
Creat remains good.

## 2017-12-31 NOTE — Assessment & Plan Note (Signed)
Hepatitis A and B today.  Will still need one more heaptitis A. Flu shot today. Will do pneumovax next visit with hep B #3.

## 2017-12-31 NOTE — Progress Notes (Signed)
   Subjective:    Patient ID: Latoya Roth, female    DOB: October 14, 1971, 47 y.o.   MRN: [id number]  HPI Here for follow up of HIV Changed to [facility] and denies any issues.  No missed doses.  Doing well with no associated nv/d.  Pleased with Biktarvy.  CD4 470 and vrial load just 27.  Creat wnl.  No rashes.     Review of Systems  Constitutional: Negative for fatigue.  Gastrointestinal: Negative for diarrhea.  Skin: Negative for rash.       Objective:   Physical Exam  Constitutional: She appears well-developed and well-nourished. No distress.  HENT:  Mouth/Throat: No oropharyngeal exudate.  Eyes: No scleral icterus.  Cardiovascular: Normal rate, regular rhythm and normal heart sounds.  No murmur heard. Pulmonary/Chest: Effort normal and breath sounds normal. No respiratory distress.  Skin: No rash noted.   SH: no tobacco       Assessment & Plan:

## 2017-12-31 NOTE — Assessment & Plan Note (Signed)
Doing well with Biktarvy.  No changes and rtc 6 months

## 2018-01-07 NOTE — Addendum Note (Signed)
Addended by: Landis Gandy on: 01/07/2018 02:03 PM   Modules accepted: Orders

## 2018-01-24 ENCOUNTER — Encounter (HOSPITAL_COMMUNITY): Payer: Self-pay | Admitting: Family Medicine

## 2018-01-24 ENCOUNTER — Ambulatory Visit (HOSPITAL_COMMUNITY)
Admission: EM | Admit: 2018-01-24 | Discharge: 2018-01-24 | Disposition: A | Discharge status: Home / Self Care | Payer: Medicaid Other | Attending: Internal Medicine | Admitting: Internal Medicine

## 2018-01-24 ENCOUNTER — Ambulatory Visit (INDEPENDENT_AMBULATORY_CARE_PROVIDER_SITE_OTHER): Payer: Medicaid Other

## 2018-01-24 DIAGNOSIS — W000XXA Fall on same level due to ice and snow, initial encounter: Secondary | ICD-10-CM

## 2018-01-24 DIAGNOSIS — M79622 Pain in left upper arm: Secondary | ICD-10-CM

## 2018-01-24 DIAGNOSIS — W19XXXA Unspecified fall, initial encounter: Secondary | ICD-10-CM

## 2018-01-24 MED ORDER — MELOXICAM 7.5 MG PO TABS: 7.5000 mg | ORAL_TABLET | Freq: Every day | ORAL | 0 refills | Status: DC

## 2018-01-24 NOTE — ED Provider Notes (Signed)
[facility]    CSN: [id number] Arrival date & time: 01/24/18  1229     History   Chief Complaint Chief Complaint  Patient presents with  . Fall    HPI JENAE TOMASELLO is a 47 y.o. female.   Roshaunda presents with complaints of left upper arm pain after a fall on 1/30. She slipped on ice and landed on her left upper arm/shoulder. States she did not immediately have pain. Pain worse last night with laying on the shoulder and pain and weakness yesterday with any raising of arm. Without elbow, wrist or hand pain. Denies previous shoulder injury. Has not taken any medications for her pain. Rates pain 7/10. Without numbness or tingling to hand or fingers. History of arthritis, diabetes, hypertension, hiv.    ROS per HPI.       Past Medical History:  Diagnosis Date  . Anxiety   . Arthritis   . Asthma   . Bell's palsy   . BV (bacterial vaginosis)   . Depression   . Diabetes mellitus   . HIV disease ([place])   . Hypertension   . Obesity     Patient Active Problem List   Diagnosis Date Noted  . Screening examination for venereal disease 12/31/2017  . Encounter for long-term (current) use of high-risk medication 12/31/2017  . Vaccine counseling 12/31/2017  . Medication monitoring encounter 05/06/2017  . Homelessness 04/04/2017  . Type 2 diabetes mellitus without complications ([place]) 40/98/1191  . Human immunodeficiency virus I infection ([employer]) 03/13/2017  . Hypertension 04/27/2016  . Generalized weakness 04/27/2016  . Asthma 04/27/2016  . BMI 40.0-44.9, adult ([place]) 01/15/2014    Past Surgical History:  Procedure Laterality Date  . CHOLECYSTECTOMY      OB History    Gravida Para Term Preterm AB Living   2 2 2  0 0 2   SAB TAB Ectopic Multiple Live Births   0 0 0 0 2       Home Medications    Prior to Admission medications   Medication Sig Start Date End Date Taking? Authorizing Provider  albuterol (PROVENTIL HFA;VENTOLIN HFA) 108 (90 BASE)  MCG/ACT inhaler Inhale 2 puffs into the lungs every 6 (six) hours as needed for wheezing or shortness of breath.    [provider]  albuterol (PROVENTIL) (2.5 MG/3ML) 0.083% nebulizer solution Take 2.5 mg by nebulization every 6 (six) hours as needed. For shortness of breath    [provider]  albuterol-ipratropium (COMBIVENT) 18-103 MCG/ACT inhaler Inhale 2 puffs into the lungs every 6 (six) hours as needed for shortness of breath. Reported on 04/27/2016    [provider]  bictegravir-emtricitabine-tenofovir AF (BIKTARVY) 50-200-25 MG TABS tablet Take 1 tablet by mouth daily. 08/13/17   Thayer Headings, MD  budesonide-formoterol (SYMBICORT) 160-4.5 MCG/ACT inhaler Inhale 2 puffs into the lungs 2 (two) times daily.    [provider]  cetirizine (ZYRTEC) 10 MG tablet Take 10 mg by mouth daily.    [provider]  glimepiride (AMARYL) 4 MG tablet Take 4 mg by mouth daily with breakfast.    [provider]  hydrOXYzine (ATARAX/VISTARIL) 25 MG tablet Take 25 mg by mouth 3 (three) times daily as needed for itching.    [provider]  Insulin Glargine (LANTUS SOLOSTAR) 100 UNIT/ML Solostar Pen Inject 20 Units into the skin daily at 10 pm. 04/30/16   Eugenie Filler, MD  lisinopril (PRINIVIL,ZESTRIL) 10 MG tablet Take 10 mg by mouth daily.  [provider]  meloxicam (MOBIC) 7.5 MG tablet Take 1 tablet (7.5 mg total) by mouth daily. 01/24/18   Zigmund Gottron, NP  metFORMIN (GLUCOPHAGE) 500 MG tablet Take 500 mg by mouth 2 (two) times daily with a meal.    [provider]  Multiple Vitamin (MULTIVITAMIN WITH MINERALS) TABS tablet Take 1 tablet by mouth daily.    [provider]  ondansetron (ZOFRAN) 4 MG tablet TAKE 1 TABLET (4 MG TOTAL) BY MOUTH DAILY AS NEEDED FOR NAUSEA OR VOMITING (30 MINUTES PRIOR TO GENVOYA AS NEEDED). 12/18/17   Comer, Okey Regal, MD  potassium chloride SA (K-DUR,KLOR-CON) 20 MEQ tablet Take 1  tablet (20 mEq total) by mouth daily. 05/01/16   Eugenie Filler, MD  predniSONE (DELTASONE) 20 MG tablet Take 3 tablets (60 mg total) by mouth daily. 05/17/17   Ward, Delice Bison, DO    Family History Family History  Problem Relation Age of Onset  . Hypertension Mother   . Cancer Son     Social History Social History   Tobacco Use  . Smoking status: Former Research scientist (life sciences)  . Smokeless tobacco: Never Used  Substance Use Topics  . Alcohol use: Yes    Alcohol/week: 8.4 oz    Types: 14 Cans of beer per week    Comment: 2/day  . Drug use: No     Allergies   Penicillins; Shellfish allergy; and Amoxicillin   Review of Systems Review of Systems   Physical Exam Triage Vital Signs ED Triage Vitals  Enc Vitals Group     BP 01/24/18 1258 (!) 151/94     Pulse Rate 01/24/18 1258 88     Resp 01/24/18 1258 18     Temp 01/24/18 1258 98.2 F (36.8 C)     Temp src --      SpO2 01/24/18 1258 100 %     Weight --      Height --      Head Circumference --      Peak Flow --      Pain Score 01/24/18 1257 10     Pain Loc --      Pain Edu? --      Excl. in [place]? --    No data found.  Updated Vital Signs BP (!) 151/94   Pulse 88   Temp 98.2 F (36.8 C)   Resp 18   SpO2 100%   Visual Acuity Right Eye Distance:   Left Eye Distance:   Bilateral Distance:    Right Eye Near:   Left Eye Near:    Bilateral Near:     Physical Exam  Constitutional: She is oriented to person, place, and time. She appears well-developed and well-nourished. No distress.  Cardiovascular: Normal rate, regular rhythm and normal heart sounds.  Pulmonary/Chest: Effort normal and breath sounds normal.  Musculoskeletal:       Left shoulder: She exhibits decreased range of motion and decreased strength. She exhibits no tenderness, no bony tenderness, no swelling, no effusion, no crepitus, no deformity, no laceration and normal pulse.       Left elbow: Normal. She exhibits normal range of motion, no swelling, no  effusion, no deformity and no laceration. No tenderness found.       Left wrist: Normal.       Left upper arm: She exhibits tenderness and bony tenderness.       Arms:      Left hand: Normal. Normal sensation noted. Normal strength noted.  Unable to raise arm due to pain to lateral deltoid; unable to externally rotate shoulder; unable to perform arc test due to weakness and pain with any raising of arm;  full ROM to elbow wrist and hand; sensation intact; strong radial pulse  Neurological: She is alert and oriented to person, place, and time.  Skin: Skin is warm and dry.     UC Treatments / Results  Labs (all labs ordered are listed, but only abnormal results are displayed) Labs Reviewed - No data to display  EKG  EKG Interpretation None       Radiology Dg Humerus Left  Result Date: 01/24/2018 CLINICAL DATA:  Fall. EXAM: LEFT HUMERUS - 2+ VIEW COMPARISON:  No recent prior. FINDINGS: Mild acromioclavicular and glenohumeral degenerative change. Mild degenerative changes left elbow. No acute bony abnormality identified. No evidence of fracture. IMPRESSION: Mild degenerative changes left shoulder and elbow. No acute bony abnormality identified. Electronically Signed   By: Marcello Moores  Register   On: 01/24/2018 14:13    Procedures Procedures (including critical care time)  Medications Ordered in UC Medications - No data to display   Initial Impression / Assessment and Plan / UC Course  I have reviewed the triage vital signs and the nursing notes.  Pertinent labs & imaging results that were available during my care of the patient were reviewed by me and considered in my medical decision making (see chart for details).     Xray without acute findings. Concern for rotator cuff injury. mobic daily. To follow with pcp as risk for renal injury due to HIV medications with long term nsaid. Follow up in the next 1-2 weeks for recheck as may need ortho referral if symptoms persist or worsen.  ROM exercises provided. Patient verbalized understanding and agreeable to plan.     Final Clinical Impressions(s) / UC Diagnoses   Final diagnoses:  Fall, initial encounter  Left upper arm pain    ED Discharge Orders        Ordered    meloxicam (MOBIC) 7.5 MG tablet  Daily     01/24/18 1439       Controlled Substance Prescriptions [place] Controlled Substance Registry consulted? Not Applicable   Zigmund Gottron, NP 01/24/18 1442

## 2018-01-24 NOTE — ED Triage Notes (Signed)
Pt here for left arm pain after fall on Wednesday in the parking lot. Pt unable to raise arm up.

## 2018-01-24 NOTE — Discharge Instructions (Signed)
Mobic daily for pain. Range of motion exercises as provided. Please make an appointment with your primary care provider for reevaluation of your symptoms in the next 1-2 weeks as you may need to follow with orthopedics if symptoms persist.

## 2018-05-19 ENCOUNTER — Emergency Department (HOSPITAL_COMMUNITY)
Admission: EM | Admit: 2018-05-19 | Discharge: 2018-05-19 | Disposition: A | Discharge status: Home / Self Care | Payer: Medicaid Other | Attending: Emergency Medicine | Admitting: Emergency Medicine

## 2018-05-19 ENCOUNTER — Encounter (HOSPITAL_COMMUNITY): Payer: Self-pay | Admitting: Emergency Medicine

## 2018-05-19 DIAGNOSIS — Z794 Long term (current) use of insulin: Secondary | ICD-10-CM | POA: Insufficient documentation

## 2018-05-19 DIAGNOSIS — I10 Essential (primary) hypertension: Secondary | ICD-10-CM | POA: Insufficient documentation

## 2018-05-19 DIAGNOSIS — M7989 Other specified soft tissue disorders: Secondary | ICD-10-CM | POA: Diagnosis not present

## 2018-05-19 DIAGNOSIS — E119 Type 2 diabetes mellitus without complications: Secondary | ICD-10-CM | POA: Diagnosis not present

## 2018-05-19 DIAGNOSIS — J45909 Unspecified asthma, uncomplicated: Secondary | ICD-10-CM | POA: Diagnosis not present

## 2018-05-19 DIAGNOSIS — L230 Allergic contact dermatitis due to metals: Secondary | ICD-10-CM | POA: Insufficient documentation

## 2018-05-19 DIAGNOSIS — R21 Rash and other nonspecific skin eruption: Secondary | ICD-10-CM | POA: Diagnosis present

## 2018-05-19 DIAGNOSIS — Z79899 Other long term (current) drug therapy: Secondary | ICD-10-CM | POA: Insufficient documentation

## 2018-05-19 DIAGNOSIS — B20 Human immunodeficiency virus [HIV] disease: Secondary | ICD-10-CM | POA: Diagnosis not present

## 2018-05-19 DIAGNOSIS — Z87891 Personal history of nicotine dependence: Secondary | ICD-10-CM | POA: Insufficient documentation

## 2018-05-19 MED ORDER — DIPHENHYDRAMINE HCL 25 MG PO CAPS
50.0000 mg | ORAL_CAPSULE | Freq: Once | ORAL | Status: AC
  Administered 2018-05-19: 50 mg via ORAL
  Filled 2018-05-19: qty 2

## 2018-05-19 MED ORDER — PREDNISONE 20 MG PO TABS
60.0000 mg | ORAL_TABLET | Freq: Once | ORAL | Status: AC
  Administered 2018-05-19: 60 mg via ORAL
  Filled 2018-05-19: qty 3

## 2018-05-19 MED ORDER — HYDROXYZINE HCL 25 MG PO TABS: 25.0000 mg | ORAL_TABLET | Freq: Three times a day (TID) | ORAL | 0 refills | Status: DC | PRN

## 2018-05-19 NOTE — Discharge Instructions (Signed)
Your rash is likely due to the new jewelry.  Please avoid wearing this jewelry.  Take vistaril as needed for itch.  You may use hydrocortisone 1% as needed for your rash.  Follow up with your doctor for further evaluation of your chronic leg swelling.

## 2018-05-19 NOTE — ED Triage Notes (Signed)
Pt states she developed a rash around her chest and neck. Small red bumps. This started last night. Pt also complains of swelling in left lower leg that has been going on for over a year.

## 2018-05-19 NOTE — ED Provider Notes (Signed)
[facility] Provider Note   CSN: [id number] Arrival date & time: 05/19/18  8938     History   Chief Complaint Chief Complaint  Patient presents with  . Rash  . Leg Swelling    HPI Latoya Roth is a 47 y.o. female.  HPI   47 year old female with history of diabetes, HIV, obesity and depression presenting for evaluation of a rash.  Patient reported itchy rash around her neck that started approximately 1 day ago.  Itchiness is mild but she did notice some redness around her neck.  She has history of asthma but denies worsening shortness of breath or wheezing.  No report of fever, headache, lightheadedness, dizziness, productive cough, trouble swallowing, chest pain, abdominal cramping.  When asked if there is any new exposure, patient did recall wearing a new piece of jewelry's prior to the rash appearance.  She denies any new soap, detergent, new pets medication changes.  She also report having left leg swelling for more than a year without any significant pain and has been evaluated for it in the past.  No recent injury and no back pain leg weakness or numbness.  No prior history of DVT.  She is currently compliant with her HIV medication.  Past Medical History:  Diagnosis Date  . Anxiety   . Arthritis   . Asthma   . Bell's palsy   . BV (bacterial vaginosis)   . Depression   . Diabetes mellitus   . HIV disease ([place])   . Hypertension   . Obesity     Patient Active Problem List   Diagnosis Date Noted  . Screening examination for venereal disease 12/31/2017  . Encounter for long-term (current) use of high-risk medication 12/31/2017  . Vaccine counseling 12/31/2017  . Medication monitoring encounter 05/06/2017  . Homelessness 04/04/2017  . Type 2 diabetes mellitus without complications ([place]) 10/09/5101  . Human immunodeficiency virus I infection ([place]) 03/13/2017  . Hypertension 04/27/2016  . Generalized weakness 04/27/2016  .  Asthma 04/27/2016  . BMI 40.0-44.9, adult ([place]) 01/15/2014    Past Surgical History:  Procedure Laterality Date  . CHOLECYSTECTOMY       OB History    Gravida  2   Para  2   Term  2   Preterm  0   AB  0   Living  2     SAB  0   TAB  0   Ectopic  0   Multiple  0   Live Births  2            Home Medications    Prior to Admission medications   Medication Sig Start Date End Date Taking? Authorizing Provider  albuterol (PROVENTIL HFA;VENTOLIN HFA) 108 (90 BASE) MCG/ACT inhaler Inhale 2 puffs into the lungs every 6 (six) hours as needed for wheezing or shortness of breath.    [provider]  albuterol (PROVENTIL) (2.5 MG/3ML) 0.083% nebulizer solution Take 2.5 mg by nebulization every 6 (six) hours as needed. For shortness of breath    [provider]  albuterol-ipratropium (COMBIVENT) 18-103 MCG/ACT inhaler Inhale 2 puffs into the lungs every 6 (six) hours as needed for shortness of breath. Reported on 04/27/2016    [provider]  bictegravir-emtricitabine-tenofovir AF (BIKTARVY) 50-200-25 MG TABS tablet Take 1 tablet by mouth daily. 08/13/17   Thayer Headings, MD  budesonide-formoterol (SYMBICORT) 160-4.5 MCG/ACT inhaler Inhale 2 puffs into the lungs 2 (two) times daily.  [provider]  cetirizine (ZYRTEC) 10 MG tablet Take 10 mg by mouth daily.    [provider]  glimepiride (AMARYL) 4 MG tablet Take 4 mg by mouth daily with breakfast.    [provider]  hydrOXYzine (ATARAX/VISTARIL) 25 MG tablet Take 25 mg by mouth 3 (three) times daily as needed for itching.    [provider]  Insulin Glargine (LANTUS SOLOSTAR) 100 UNIT/ML Solostar Pen Inject 20 Units into the skin daily at 10 pm. 04/30/16   Eugenie Filler, MD  lisinopril (PRINIVIL,ZESTRIL) 10 MG tablet Take 10 mg by mouth daily.    [provider]  meloxicam (MOBIC) 7.5 MG tablet Take 1 tablet (7.5 mg total) by mouth daily. 01/24/18    Zigmund Gottron, NP  metFORMIN (GLUCOPHAGE) 500 MG tablet Take 500 mg by mouth 2 (two) times daily with a meal.    [provider]  Multiple Vitamin (MULTIVITAMIN WITH MINERALS) TABS tablet Take 1 tablet by mouth daily.    [provider]  ondansetron (ZOFRAN) 4 MG tablet TAKE 1 TABLET (4 MG TOTAL) BY MOUTH DAILY AS NEEDED FOR NAUSEA OR VOMITING (30 MINUTES PRIOR TO GENVOYA AS NEEDED). 12/18/17   Comer, Okey Regal, MD  potassium chloride SA (K-DUR,KLOR-CON) 20 MEQ tablet Take 1 tablet (20 mEq total) by mouth daily. 05/01/16   Eugenie Filler, MD  predniSONE (DELTASONE) 20 MG tablet Take 3 tablets (60 mg total) by mouth daily. 05/17/17   Ward, Delice Bison, DO    Family History Family History  Problem Relation Age of Onset  . Hypertension Mother   . Cancer Son     Social History Social History   Tobacco Use  . Smoking status: Former Research scientist (life sciences)  . Smokeless tobacco: Never Used  Substance Use Topics  . Alcohol use: Yes    Alcohol/week: 8.4 oz    Types: 14 Cans of beer per week    Comment: 2/day  . Drug use: No     Allergies   Penicillins; Shellfish allergy; and Amoxicillin   Review of Systems Review of Systems  All other systems reviewed and are negative.    Physical Exam Updated Vital Signs BP (!) 158/80 (BP Location: Right Arm)   Pulse 60   Temp 97.8 F (36.6 C) (Oral)   Resp 16   SpO2 100%   Physical Exam  Constitutional: She is oriented to person, place, and time. She appears well-developed and well-nourished. No distress.  Obese female resting comfortably in no acute discomfort  HENT:  Head: Atraumatic.  Mouth/Throat: Oropharynx is clear and moist.  Eyes: Conjunctivae are normal.  Neck: Normal range of motion. Neck supple.  Cardiovascular: Normal rate and regular rhythm.  Pulmonary/Chest: Effort normal and breath sounds normal.  Abdominal: Soft. She exhibits no distension. There is no tenderness.  Musculoskeletal: She exhibits edema (Left lower  extremity is mildly edematous as compared to right, no calf tenderness, no erythema, no pitting edema.  Dorsalis pedis pulse palpable.).  Neurological: She is alert and oriented to person, place, and time.  Skin: Rash (Localized skin irritation surrounding the neck consistent with contact dermatitis without signs of infection.) noted.  Psychiatric: She has a normal mood and affect.  Nursing note and vitals reviewed.    ED Treatments / Results  Labs (all labs ordered are listed, but only abnormal results are displayed) Labs Reviewed - No data to display  EKG None  Radiology No results found.  Procedures Procedures (including critical care time)  Medications Ordered in ED Medications  diphenhydrAMINE (BENADRYL) capsule 50 mg (50 mg Oral Given 05/19/18 0937)  predniSONE (DELTASONE) tablet 60 mg (60 mg Oral Given 05/19/18 0937)     Initial Impression / Assessment and Plan / ED Course  I have reviewed the triage vital signs and the nursing notes.  Pertinent labs & imaging results that were available during my care of the patient were reviewed by me and considered in my medical decision making (see chart for details).     BP (!) 158/70   Pulse 61   Temp 97.8 F (36.6 C) (Oral)   Resp 16   SpO2 100%    Final Clinical Impressions(s) / ED Diagnoses   Final diagnoses:  Contact dermatitis due to metals, unspecified contact dermatitis type  Leg swelling    ED Discharge Orders        Ordered    hydrOXYzine (ATARAX/VISTARIL) 25 MG tablet  3 times daily PRN     05/19/18 1007     9:37 AM Patient here with a localized rash around her neck after wearing new juries.  It appears to be contact dermatitis without systemic manifestation and no evidence of anaphylaxis.  It does not appears to be infected.  We will give prednisone here, and Benadryl for symptom relief.  Encourage patient to use hydrocortisone 1% cream at home for itchiness and to avoid wearing the same  jewelry.  Patient also mention of left leg swelling for more than a year.  This appears to be a chronic condition.  On exam, no evidence to suggest infectious etiology, low suspicion for DVT and it appears the patient has had DVT study for this in the past that was negative.  She is neurovascular intact.  Encourage patient to follow-up with her primary care provider for further management of her leg swelling.   Domenic Moras, PA-C 05/19/18 [place], Dwight, DO 05/19/18 1013

## 2018-06-16 ENCOUNTER — Other Ambulatory Visit: Payer: Medicaid Other

## 2018-06-16 DIAGNOSIS — Z79899 Other long term (current) drug therapy: Secondary | ICD-10-CM

## 2018-06-16 DIAGNOSIS — B20 Human immunodeficiency virus [HIV] disease: Secondary | ICD-10-CM

## 2018-06-16 DIAGNOSIS — Z113 Encounter for screening for infections with a predominantly sexual mode of transmission: Secondary | ICD-10-CM

## 2018-06-17 LAB — CBC WITH DIFFERENTIAL/PLATELET
Basophils Absolute: 52 cells/uL (ref 0–200)
Basophils Relative: 0.9 %
EOS PCT: 3.6 %
Eosinophils Absolute: 209 cells/uL (ref 15–500)
HEMATOCRIT: 36.9 % (ref 35.0–45.0)
Hemoglobin: 12 g/dL (ref 11.7–15.5)
LYMPHS ABS: 2047 {cells}/uL (ref 850–3900)
MCH: 25.1 pg — ABNORMAL LOW (ref 27.0–33.0)
MCHC: 32.5 g/dL (ref 32.0–36.0)
MCV: 77 fL — ABNORMAL LOW (ref 80.0–100.0)
MPV: 9.5 fL (ref 7.5–12.5)
Monocytes Relative: 7.7 %
NEUTROS PCT: 52.5 %
Neutro Abs: 3045 cells/uL (ref 1500–7800)
PLATELETS: 344 10*3/uL (ref 140–400)
RBC: 4.79 10*6/uL (ref 3.80–5.10)
RDW: 15 % (ref 11.0–15.0)
Total Lymphocyte: 35.3 %
WBC mixed population: 447 cells/uL (ref 200–950)
WBC: 5.8 10*3/uL (ref 3.8–10.8)

## 2018-06-17 LAB — T-HELPER CELL (CD4) - (RCID CLINIC ONLY)
CD4 % Helper T Cell: 26 % — ABNORMAL LOW (ref 33–55)
CD4 T Cell Abs: 510 /uL (ref 400–2700)

## 2018-06-17 LAB — COMPLETE METABOLIC PANEL WITH GFR
AG Ratio: 1.2 (calc) (ref 1.0–2.5)
ALT: 8 U/L (ref 6–29)
AST: 13 U/L (ref 10–35)
Albumin: 4 g/dL (ref 3.6–5.1)
Alkaline phosphatase (APISO): 84 U/L (ref 33–115)
BUN: 8 mg/dL (ref 7–25)
CALCIUM: 9.7 mg/dL (ref 8.6–10.2)
CHLORIDE: 100 mmol/L (ref 98–110)
CO2: 28 mmol/L (ref 20–32)
Creat: 0.75 mg/dL (ref 0.50–1.10)
GFR, Est African American: 109 mL/min/{1.73_m2} (ref >=60)
GFR, Est Non African American: 94 mL/min/{1.73_m2} (ref >=60)
Globulin: 3.3 g/dL (calc) (ref 1.9–3.7)
Glucose, Bld: 114 mg/dL — ABNORMAL HIGH (ref 65–99)
POTASSIUM: 4.1 mmol/L (ref 3.5–5.3)
SODIUM: 137 mmol/L (ref 135–146)
Total Bilirubin: 0.3 mg/dL (ref 0.2–1.2)
Total Protein: 7.3 g/dL (ref 6.1–8.1)

## 2018-06-17 LAB — LIPID PANEL
Cholesterol: 161 mg/dL (ref <200)
HDL: 51 mg/dL (ref >50)
LDL Cholesterol (Calc): 91 mg/dL (calc)
NON-HDL CHOLESTEROL (CALC): 110 mg/dL (ref <130)
TRIGLYCERIDES: 95 mg/dL (ref <150)
Total CHOL/HDL Ratio: 3.2 (calc) (ref <5.0)

## 2018-06-17 LAB — RPR: RPR: NONREACTIVE

## 2018-06-19 LAB — HIV-1 RNA QUANT-NO REFLEX-BLD
HIV 1 RNA Quant: <20 copies/mL — AB
HIV-1 RNA Quant, Log: <1.3 Log copies/mL — AB

## 2018-06-30 ENCOUNTER — Encounter: Payer: Self-pay | Admitting: Internal Medicine

## 2018-06-30 ENCOUNTER — Ambulatory Visit (INDEPENDENT_AMBULATORY_CARE_PROVIDER_SITE_OTHER): Payer: Medicaid Other | Admitting: Internal Medicine

## 2018-06-30 VITALS — BP 136/87 | HR 66 | Temp 97.9°F | Ht 67.0 in | Wt 233.8 lb

## 2018-06-30 DIAGNOSIS — B20 Human immunodeficiency virus [HIV] disease: Secondary | ICD-10-CM | POA: Diagnosis present

## 2018-06-30 DIAGNOSIS — Z7185 Encounter for immunization safety counseling: Secondary | ICD-10-CM

## 2018-06-30 DIAGNOSIS — Z23 Encounter for immunization: Secondary | ICD-10-CM | POA: Insufficient documentation

## 2018-06-30 DIAGNOSIS — Z7189 Other specified counseling: Secondary | ICD-10-CM | POA: Diagnosis not present

## 2018-06-30 MED ORDER — PNEUMOCOCCAL 13-VAL CONJ VACC IM SUSP
0.5000 mL | Freq: Once | INTRAMUSCULAR | Status: AC
  Administered 2018-06-30: 0.5 mL via INTRAMUSCULAR

## 2018-06-30 NOTE — Assessment & Plan Note (Signed)
Doing well on Biktarvy.  No changes.  rtc 6 months.

## 2018-06-30 NOTE — Assessment & Plan Note (Signed)
Counseled on hepatitis B and #3 given today

## 2018-06-30 NOTE — Progress Notes (Signed)
   Subjective:    Patient ID: Latoya Roth, female    DOB: [birth year], 47 y.o.   MRN: [id number]  HPI Here for follow up of HIV Continues on [place] and no missed doses.  CD4 of 510, viral load < 20.  No associated rash, diarrhea.  No new issues.  Gets her PAP from her PCP and will see him later this month.     Review of Systems  Constitutional: Negative for fatigue.  Gastrointestinal: Negative for diarrhea and nausea.  Skin: Negative for rash.       Objective:   Physical Exam  Constitutional: She appears well-developed and well-nourished. No distress.  HENT:  Mouth/Throat: No oropharyngeal exudate.  Eyes: No scleral icterus.  Cardiovascular: Normal rate, regular rhythm and normal heart sounds.  No murmur heard. Pulmonary/Chest: Effort normal and breath sounds normal. No respiratory distress.  Skin: No rash noted.   SH: no current tobacco       Assessment & Plan:

## 2018-06-30 NOTE — Assessment & Plan Note (Signed)
Counseled on pneumonia shot and given today.

## 2018-06-30 NOTE — Assessment & Plan Note (Signed)
Creat, LFTs, wnl.

## 2018-07-01 ENCOUNTER — Emergency Department (HOSPITAL_COMMUNITY): Payer: Medicaid Other

## 2018-07-01 ENCOUNTER — Encounter (HOSPITAL_COMMUNITY): Payer: Self-pay | Admitting: Emergency Medicine

## 2018-07-01 ENCOUNTER — Other Ambulatory Visit: Payer: Self-pay

## 2018-07-01 ENCOUNTER — Emergency Department (HOSPITAL_COMMUNITY)
Admission: EM | Admit: 2018-07-01 | Discharge: 2018-07-01 | Disposition: A | Discharge status: Home / Self Care | Payer: Medicaid Other | Attending: Emergency Medicine | Admitting: Emergency Medicine

## 2018-07-01 DIAGNOSIS — Y9301 Activity, walking, marching and hiking: Secondary | ICD-10-CM | POA: Insufficient documentation

## 2018-07-01 DIAGNOSIS — Z794 Long term (current) use of insulin: Secondary | ICD-10-CM | POA: Insufficient documentation

## 2018-07-01 DIAGNOSIS — W0110XA Fall on same level from slipping, tripping and stumbling with subsequent striking against unspecified object, initial encounter: Secondary | ICD-10-CM | POA: Insufficient documentation

## 2018-07-01 DIAGNOSIS — S52571A Other intraarticular fracture of lower end of right radius, initial encounter for closed fracture: Secondary | ICD-10-CM | POA: Diagnosis not present

## 2018-07-01 DIAGNOSIS — I10 Essential (primary) hypertension: Secondary | ICD-10-CM | POA: Insufficient documentation

## 2018-07-01 DIAGNOSIS — Z87891 Personal history of nicotine dependence: Secondary | ICD-10-CM | POA: Diagnosis not present

## 2018-07-01 DIAGNOSIS — Z79899 Other long term (current) drug therapy: Secondary | ICD-10-CM | POA: Insufficient documentation

## 2018-07-01 DIAGNOSIS — Y999 Unspecified external cause status: Secondary | ICD-10-CM | POA: Insufficient documentation

## 2018-07-01 DIAGNOSIS — J45909 Unspecified asthma, uncomplicated: Secondary | ICD-10-CM | POA: Insufficient documentation

## 2018-07-01 DIAGNOSIS — Y929 Unspecified place or not applicable: Secondary | ICD-10-CM | POA: Insufficient documentation

## 2018-07-01 DIAGNOSIS — E119 Type 2 diabetes mellitus without complications: Secondary | ICD-10-CM | POA: Insufficient documentation

## 2018-07-01 DIAGNOSIS — S6991XA Unspecified injury of right wrist, hand and finger(s), initial encounter: Secondary | ICD-10-CM | POA: Diagnosis present

## 2018-07-01 MED ORDER — OXYCODONE-ACETAMINOPHEN 5-325 MG PO TABS
1.0000 | ORAL_TABLET | Freq: Once | ORAL | Status: AC
  Administered 2018-07-01: 1 via ORAL
  Filled 2018-07-01: qty 1

## 2018-07-01 MED ORDER — OXYCODONE-ACETAMINOPHEN 5-325 MG PO TABS: 1.0000 | ORAL_TABLET | Freq: Four times a day (QID) | ORAL | 0 refills | Status: DC | PRN

## 2018-07-01 NOTE — ED Notes (Signed)
Ortho tech at bedside 

## 2018-07-01 NOTE — ED Provider Notes (Signed)
[facility] EMERGENCY DEPARTMENT Provider Note   CSN: [id number] Arrival date & time: 07/01/18  2297     History   Chief Complaint Chief Complaint  Patient presents with  . Wrist Pain    HPI Latoya Roth is a 47 y.o. female with a hx of anxiety, depression, T2DM, HIV, HTN, homelessness, and obesity who presents to the ED s/p mechanical fall yesterday complaining of R wrist pain. Patient states she was ambulating, tripped, and fell to the ground. She is unsure of exactly how she landed but did fall onto RUE and R wrist was in an extended position. No head injury or LOC, only area of discomfort is the R wrist which has associated swelling. Pain is a 10/10 in severity, worse with movement, no alleviating factors. She took ibuprofen last night without relief. Denies numbness, tingling, or weakness. Patient is R hand dominant.   HPI  Past Medical History:  Diagnosis Date  . Anxiety   . Arthritis   . Asthma   . Bell's palsy   . BV (bacterial vaginosis)   . Depression   . Diabetes mellitus   . HIV disease ([place])   . Hypertension   . Obesity     Patient Active Problem List   Diagnosis Date Noted  . Need for prophylactic vaccination against Streptococcus pneumoniae (pneumococcus) 06/30/2018  . Screening examination for venereal disease 12/31/2017  . Encounter for long-term (current) use of high-risk medication 12/31/2017  . Vaccine counseling 12/31/2017  . Medication monitoring encounter 05/06/2017  . Homelessness 04/04/2017  . Type 2 diabetes mellitus without complications ([place]) 98/92/1194  . Human immunodeficiency virus I infection ([place]) 03/13/2017  . Hypertension 04/27/2016  . Generalized weakness 04/27/2016  . Asthma 04/27/2016  . BMI 40.0-44.9, adult ([place]) 01/15/2014    Past Surgical History:  Procedure Laterality Date  . CHOLECYSTECTOMY       OB History    Gravida  3   Para  2   Term  2   Preterm  0   AB  0   Living  2     SAB    0   TAB  0   Ectopic  0   Multiple  0   Live Births  2            Home Medications    Prior to Admission medications   Medication Sig Start Date End Date Taking? Authorizing Provider  albuterol (PROVENTIL HFA;VENTOLIN HFA) 108 (90 BASE) MCG/ACT inhaler Inhale 2 puffs into the lungs every 6 (six) hours as needed for wheezing or shortness of breath.    [provider]  albuterol (PROVENTIL) (2.5 MG/3ML) 0.083% nebulizer solution Take 2.5 mg by nebulization every 6 (six) hours as needed. For shortness of breath    [provider]  albuterol-ipratropium (COMBIVENT) 18-103 MCG/ACT inhaler Inhale 2 puffs into the lungs every 6 (six) hours as needed for shortness of breath. Reported on 04/27/2016    [provider]  bictegravir-emtricitabine-tenofovir AF (BIKTARVY) 50-200-25 MG TABS tablet Take 1 tablet by mouth daily. 08/13/17   Thayer Headings, MD  budesonide-formoterol (SYMBICORT) 160-4.5 MCG/ACT inhaler Inhale 2 puffs into the lungs 2 (two) times daily.    [provider]  cetirizine (ZYRTEC) 10 MG tablet Take 10 mg by mouth daily.    [provider]  glimepiride (AMARYL) 4 MG tablet Take 4 mg by mouth daily with breakfast.    [provider]  hydrOXYzine (ATARAX/VISTARIL) 25 MG tablet  Take 1 tablet (25 mg total) by mouth 3 (three) times daily as needed for itching. 05/19/18   Domenic Moras, PA-C  Insulin Glargine (LANTUS SOLOSTAR) 100 UNIT/ML Solostar Pen Inject 20 Units into the skin daily at 10 pm. 04/30/16   Eugenie Filler, MD  lisinopril (PRINIVIL,ZESTRIL) 10 MG tablet Take 10 mg by mouth daily.    [provider]  meloxicam (MOBIC) 7.5 MG tablet Take 1 tablet (7.5 mg total) by mouth daily. 01/24/18   Zigmund Gottron, NP  metFORMIN (GLUCOPHAGE) 500 MG tablet Take 500 mg by mouth 2 (two) times daily with a meal.    [provider]  Multiple Vitamin (MULTIVITAMIN WITH MINERALS) TABS tablet Take 1 tablet by mouth  daily.    [provider]  ondansetron (ZOFRAN) 4 MG tablet TAKE 1 TABLET (4 MG TOTAL) BY MOUTH DAILY AS NEEDED FOR NAUSEA OR VOMITING (30 MINUTES PRIOR TO GENVOYA AS NEEDED). 12/18/17   Comer, Okey Regal, MD  potassium chloride SA (K-DUR,KLOR-CON) 20 MEQ tablet Take 1 tablet (20 mEq total) by mouth daily. Patient not taking: Reported on 06/30/2018 05/01/16   Eugenie Filler, MD  predniSONE (DELTASONE) 20 MG tablet Take 3 tablets (60 mg total) by mouth daily. Patient not taking: Reported on 06/30/2018 05/17/17   Ward, Delice Bison, DO    Family History Family History  Problem Relation Age of Onset  . Hypertension Mother   . Cancer Son     Social History Social History   Tobacco Use  . Smoking status: Former Research scientist (life sciences)  . Smokeless tobacco: Never Used  Substance Use Topics  . Alcohol use: Yes    Alcohol/week: 8.4 oz    Types: 14 Cans of beer per week    Comment: 2/day  . Drug use: No     Allergies   Penicillins; Shellfish allergy; and Amoxicillin   Review of Systems Review of Systems  Constitutional: Negative for chills and fever.  Musculoskeletal: Positive for arthralgias (R wrist) and joint swelling (R wrist). Negative for neck pain.  Neurological: Negative for weakness and numbness.   Physical Exam Updated Vital Signs BP (!) 155/87 (BP Location: Left Arm)   Pulse 65   Temp 97.7 F (36.5 C) (Oral)   Resp 16   Ht 5\' 7"  (1.702 m)   Wt 105.7 kg (233 lb)   LMP 06/17/2018   SpO2 100%   BMI 36.49 kg/m   Physical Exam  Constitutional: She appears well-developed and well-nourished.  Non-toxic appearance. No distress.  HENT:  Head: Normocephalic and atraumatic. Head is without raccoon's eyes and without Battle's sign.  Eyes: Pupils are equal, round, and reactive to light. Conjunctivae and EOM are normal. Right eye exhibits no discharge. Left eye exhibits no discharge.  Neck: Normal range of motion. Neck supple. No spinous process tenderness present.  Cardiovascular:  Normal rate and regular rhythm.  Pulses:      Radial pulses are 2+ on the right side, and 2+ on the left side.  Pulmonary/Chest: Effort normal and breath sounds normal.  Musculoskeletal:  Back: No midline tenderness Lower extremities: Normal range of motion.  Nontender. Upper extremities: There is soft tissue swelling to the right wrist, most prominently dorsally, questionable deformity.  No overlying ecchymosis, erythema, open wounds, or warmth.  Patient has normal range of motion to bilateral shoulders, elbows, and left wrist, right wrist flexion and extension are limited, able to minimally move in each direction.  She is able to move all digits in flexion and  extension.  She is diffusely tender over the dorsum and the ventral surface of the right wrist. No one specific focal of most significant tenderness. Compartments are soft. NVI distally.   Neurological: She is alert.  Clear speech. Sensation grossly intact to bilateral upper extremities. Grip strength difficult to assess in R hand secondary to pain in the wrist.   Psychiatric: She has a normal mood and affect. Her behavior is normal. Thought content normal.  Nursing note and vitals reviewed.    ED Treatments / Results  Labs (all labs ordered are listed, but only abnormal results are displayed) Labs Reviewed - No data to display  EKG None  Radiology Dg Wrist Complete Right  Result Date: 07/01/2018 CLINICAL DATA:  47 year old female status post fall last night with distal radius pain and swelling. EXAM: RIGHT WRIST - COMPLETE 3+ VIEW COMPARISON:  None. FINDINGS: Intra-articular fracture of the distal right radius and radial styloid with mild comminution and minimal displacement. See image 2. No DRU involvement. Superimposed abnormal widening of the scapholunate interval (image 4). Other carpal bone alignment and joint spaces are within normal limits. The scaphoid appears intact. Proximal metacarpals and distal ulna intact. IMPRESSION:  1. Fracture of the distal right radius and radial styloid with intra-articular extension to the radiocarpal joint and mild comminution. No DRU involvement. 2. Scapholunate ligament injury and widening of the scapholunate interval, predisposing to SLAC wrist. 3. No other fracture or dislocation identified. Electronically Signed   By: Genevie Ann M.D.   On: 07/01/2018 09:48    Procedures Procedures (including critical care time)  Medications Ordered in ED Medications - No data to display   Initial Impression / Assessment and Plan / ED Course  I have reviewed the triage vital signs and the nursing notes.  Pertinent labs & imaging results that were available during my care of the patient were reviewed by me and considered in my medical decision making (see chart for details).   Patient presents s/p mechanical fall with R wrist injury. Patient nontoxic appearing, in no apparent distress, vitals WNL other than elevated BP, doubt HTN emergency. Patient tender to palpation with swelling over R wrist. NVI distally. No obvious head/neck/back injuries s/p fall. Will obtain R wrist X-ray.   X-ray notable for: 1. Fracture of the distal right radius and radial styloid with intra-articular extension to the radiocarpal joint and mild comminution. No DRU involvement. 2. Scapholunate ligament injury and widening of the scapholunate interval, predisposing to SLAC wrist. 3. No other fracture or dislocation identified.   10:15: CONSULT: Discussed case with Hilbert Odor PA-C working with hand surgeon Dr. Amedeo Plenty, will discuss case with Dr. Amedeo Plenty and return call.   10:30: Hilbert Odor PA-C present in the ED, Dr. Amedeo Plenty recommends surgery, patient does not wish to have surgery today. He subsequently recommends volar splint, pain control and close office follow up.   Patient placed in volar splint. Will give short course of percocet for pain. [place] Controlled Substance reporting System queried. Plan for  follow up with Dr. Amedeo Plenty in office. I discussed results, treatment plan, need for follow-up, and return precautions with the patient. Provided opportunity for questions, patient confirmed understanding and is in agreement with plan.   Findings and plan of care discussed with supervising physician Dr. Alvino Chapel, in agreement.    Final Clinical Impressions(s) / ED Diagnoses   Final diagnoses:  Other closed intra-articular fracture of distal end of right radius, initial encounter    ED Discharge Orders  Ordered    oxyCODONE-acetaminophen (PERCOCET/ROXICET) 5-325 MG tablet  Every 6 hours PRN     07/01/18 [address], [place] R, PA-C 07/01/18 1639    Davonna Belling, MD 07/02/18 [phone]

## 2018-07-01 NOTE — Progress Notes (Signed)
Orthopedic Tech Progress Note Patient Details:  BEMNET TROVATO 11/28/1971 [id number]  Ortho Devices Type of Ortho Device: Ace wrap, Arm sling, Short arm splint Ortho Device/Splint Interventions: Application   Post Interventions Patient Tolerated: Well Instructions Provided: Care of device   [place] Pink 07/01/2018, 11:36 AM

## 2018-07-01 NOTE — ED Notes (Signed)
Pt given ice pack for right wrist. Can feel touch, has radial pulse, limited movent of fingers r/t pain.

## 2018-07-01 NOTE — Discharge Instructions (Addendum)
Keep splint intact and dry.  No lifting, pushing, pulling with right hand.  Ice to wrist (over splint) for 30 minutes 4x/day today and tomorrow.  Keep wrist elevated over heart whenever possible.  Call Dr. Vanetta Shawl office today or tomorrow to set up an appointment within 48 hours.   Return to the ER for new or worsening symptoms including but not limited to worsened pain, numbness/tingling to fingers, fingers appearing discolored (grey/blue), or any other concerns.

## 2018-07-01 NOTE — Consult Note (Signed)
Reason for Consult:Right wrist fx Referring Physician: Robyn Haber  Latoya Roth is an 47 y.o. female.  H[place]: [place] and she fell to the ground. She put out her hand to break her fall and had immediate pain. She tried to tough it out but the pain was too bad and she came to the ED for evaluation. X-rays showed a distal radius fx and hand surgery was consulted. She is RHD and currently unemployed.  Past Medical History:  Diagnosis Date  . Anxiety   . Arthritis   . Asthma   . Bell's palsy   . BV (bacterial vaginosis)   . Depression   . Diabetes mellitus   . HIV disease ([place])   . Hypertension   . Obesity     Past Surgical History:  Procedure Laterality Date  . CHOLECYSTECTOMY      Family History  Problem Relation Age of Onset  . Hypertension Mother   . Cancer Son     Social History:  reports that she has quit smoking. She has never used smokeless tobacco. She reports that she drinks about 8.4 oz of alcohol per week. She reports that she does not use drugs.  Allergies:  Allergies  Allergen Reactions  . Penicillins Anaphylaxis    Has patient had a PCN reaction causing immediate rash, facial/tongue/throat swelling, SOB or lightheadedness with hypotension: Yes Has patient had a PCN reaction causing severe rash involving mucus membranes or skin necrosis: No Has patient had a PCN reaction that required hospitalization No Has patient had a PCN reaction occurring within the last 10 years: No If all of the above answers are "NO", then may proceed with Cephalosporin use.   . Shellfish Allergy Anaphylaxis, Hives and Swelling  . Amoxicillin Rash    Medications: I have reviewed the patient's current medications.  No results found for this or any previous visit (from the past 48 hour(s)).  Dg Wrist Complete Right  Result Date: 07/01/2018 CLINICAL DATA:  47 year old female status post fall last night with distal radius  pain and swelling. EXAM: RIGHT WRIST - COMPLETE 3+ VIEW COMPARISON:  None. FINDINGS: Intra-articular fracture of the distal right radius and radial styloid with mild comminution and minimal displacement. See image 2. No DRU involvement. Superimposed abnormal widening of the scapholunate interval (image 4). Other carpal bone alignment and joint spaces are within normal limits. The scaphoid appears intact. Proximal metacarpals and distal ulna intact. IMPRESSION: 1. Fracture of the distal right radius and radial styloid with intra-articular extension to the radiocarpal joint and mild comminution. No DRU involvement. 2. Scapholunate ligament injury and widening of the scapholunate interval, predisposing to SLAC wrist. 3. No other fracture or dislocation identified. Electronically Signed   By: Genevie Ann M.D.   On: 07/01/2018 09:48    Review of Systems  Constitutional: Negative for weight loss.  HENT: Negative for ear discharge, ear pain, hearing loss and tinnitus.   Eyes: Negative for blurred vision, double vision, photophobia and pain.  Respiratory: Negative for cough, sputum production and shortness of breath.   Cardiovascular: Negative for chest pain.  Gastrointestinal: Negative for abdominal pain, nausea and vomiting.  Genitourinary: Negative for dysuria, flank pain, frequency and urgency.  Musculoskeletal: Positive for joint pain (Right wrist). Negative for back pain, falls, myalgias and neck pain.  Neurological: Negative for dizziness, tingling, sensory change, focal weakness, loss of consciousness and headaches.  Endo/Heme/Allergies: Does not bruise/bleed easily.  Psychiatric/Behavioral: Negative  for depression, memory loss and substance abuse. The patient is not nervous/anxious.    Blood pressure (!) 155/87, pulse 65, temperature 97.7 F (36.5 C), temperature source Oral, resp. rate 16, height 5\' 7"  (1.702 m), weight 105.7 kg (233 lb), last menstrual period 06/17/2018, SpO2 100 %. Physical Exam   Constitutional: She appears well-developed and well-nourished. No distress.  HENT:  Head: Normocephalic and atraumatic.  Eyes: Conjunctivae are normal. Right eye exhibits no discharge. Left eye exhibits no discharge. No scleral icterus.  Neck: Normal range of motion.  Cardiovascular: Normal rate and regular rhythm.  Respiratory: Effort normal. No respiratory distress.  Musculoskeletal:  UEx shoulder, elbow, wrist, digits- no skin wounds, severe TTP radial wrist  Sens  Ax/R/M/U intact  Mot   Ax/ R/ PIN/ M/ AIN/ U intact  Rad 1+  Neurological: She is alert.  Skin: Skin is warm and dry. She is not diaphoretic.  Psychiatric: She has a normal mood and affect. Her behavior is normal.    Assessment/Plan: Right wrist fx -- Will splint and have her f/u with Dr. Amedeo Plenty in the office. NWB.    [name], PA-C Orthopedic Surgery [phone] 07/01/2018, 10:30 AM

## 2018-07-01 NOTE — ED Triage Notes (Signed)
Pt. Stated, I fell yesterday on my rt. Wrist.

## 2018-07-15 ENCOUNTER — Other Ambulatory Visit: Payer: Self-pay | Admitting: Internal Medicine

## 2018-08-13 ENCOUNTER — Encounter: Payer: Self-pay | Admitting: Internal Medicine

## 2018-12-22 ENCOUNTER — Other Ambulatory Visit: Payer: Medicaid Other

## 2018-12-22 ENCOUNTER — Other Ambulatory Visit (HOSPITAL_COMMUNITY)
Admission: RE | Admit: 2018-12-22 | Discharge: 2018-12-22 | Disposition: A | Discharge status: Home / Self Care | Payer: Medicaid Other | Source: Ambulatory Visit | Attending: Internal Medicine | Admitting: Internal Medicine

## 2018-12-22 DIAGNOSIS — Z113 Encounter for screening for infections with a predominantly sexual mode of transmission: Secondary | ICD-10-CM | POA: Diagnosis present

## 2018-12-22 DIAGNOSIS — B20 Human immunodeficiency virus [HIV] disease: Secondary | ICD-10-CM | POA: Insufficient documentation

## 2018-12-23 LAB — T-HELPER CELL (CD4) - (RCID CLINIC ONLY)
CD4 % Helper T Cell: 27 % — ABNORMAL LOW (ref 33–55)
CD4 T Cell Abs: 580 /uL (ref 400–2700)

## 2018-12-25 LAB — HIV-1 RNA QUANT-NO REFLEX-BLD
HIV 1 RNA Quant: <20 copies/mL — AB
HIV-1 RNA QUANT, LOG: DETECTED {Log_copies}/mL — AB

## 2018-12-26 LAB — URINE CYTOLOGY ANCILLARY ONLY
CHLAMYDIA, DNA PROBE: NEGATIVE
NEISSERIA GONORRHEA: NEGATIVE

## 2019-01-05 ENCOUNTER — Encounter: Payer: Self-pay | Admitting: Internal Medicine

## 2019-01-05 ENCOUNTER — Ambulatory Visit (INDEPENDENT_AMBULATORY_CARE_PROVIDER_SITE_OTHER): Payer: Medicaid Other | Admitting: Internal Medicine

## 2019-01-05 VITALS — BP 142/84 | HR 67 | Temp 97.8°F | Wt 244.0 lb

## 2019-01-05 DIAGNOSIS — J4520 Mild intermittent asthma, uncomplicated: Secondary | ICD-10-CM

## 2019-01-05 DIAGNOSIS — Z113 Encounter for screening for infections with a predominantly sexual mode of transmission: Secondary | ICD-10-CM | POA: Diagnosis not present

## 2019-01-05 DIAGNOSIS — B20 Human immunodeficiency virus [HIV] disease: Secondary | ICD-10-CM | POA: Diagnosis not present

## 2019-01-05 DIAGNOSIS — Z5181 Encounter for therapeutic drug level monitoring: Secondary | ICD-10-CM

## 2019-01-05 NOTE — Assessment & Plan Note (Signed)
Doing well, no issues.  rtc 6 months.  

## 2019-01-05 NOTE — Progress Notes (Signed)
   Subjective:    Patient ID: Latoya Roth, female    DOB: 1971/09/16, 48 y.o.   MRN: [id number]  HPI Here for follow up of HIV Continues on [place] and no missed doses.  CD4 of 580, viral load < 20.  No associated rash, diarrhea.  No new issues.  Gets her PAP from her PCP and had flu shot this year.   Feels some wheezing.  Using her inhalers.    Review of Systems  Constitutional: Negative for fatigue.  Gastrointestinal: Negative for diarrhea and nausea.  Skin: Negative for rash.       Objective:   Physical Exam Constitutional:      General: She is not in acute distress.    Appearance: She is well-developed.  HENT:     Mouth/Throat:     Pharynx: No oropharyngeal exudate.  Eyes:     General: No scleral icterus. Cardiovascular:     Rate and Rhythm: Normal rate and regular rhythm.     Heart sounds: Normal heart sounds. No murmur.  Pulmonary:     Effort: Pulmonary effort is normal. No respiratory distress.     Breath sounds: Normal breath sounds. No wheezing.  Skin:    Findings: No rash.          Assessment & Plan:

## 2019-01-05 NOTE — Assessment & Plan Note (Signed)
No significant wheezing on exam now, will follow up with her primary if she feels worse.

## 2019-06-22 ENCOUNTER — Other Ambulatory Visit: Payer: Medicaid Other

## 2019-06-22 ENCOUNTER — Other Ambulatory Visit: Payer: Self-pay

## 2019-06-22 DIAGNOSIS — B20 Human immunodeficiency virus [HIV] disease: Secondary | ICD-10-CM

## 2019-06-22 DIAGNOSIS — Z113 Encounter for screening for infections with a predominantly sexual mode of transmission: Secondary | ICD-10-CM

## 2019-06-22 DIAGNOSIS — Z5181 Encounter for therapeutic drug level monitoring: Secondary | ICD-10-CM

## 2019-06-23 LAB — T-HELPER CELL (CD4) - (RCID CLINIC ONLY)
CD4 % Helper T Cell: 33 % (ref 33–65)
CD4 T Cell Abs: 717 /uL (ref 400–1790)

## 2019-06-30 LAB — CBC WITH DIFFERENTIAL/PLATELET
Absolute Monocytes: 544 cells/uL (ref 200–950)
Basophils Absolute: 41 cells/uL (ref 0–200)
Basophils Relative: 0.6 %
Eosinophils Absolute: 218 cells/uL (ref 15–500)
Eosinophils Relative: 3.2 %
HCT: 36.3 % (ref 35.0–45.0)
Hemoglobin: 11.9 g/dL (ref 11.7–15.5)
Lymphs Abs: 2244 cells/uL (ref 850–3900)
MCH: 26.5 pg — ABNORMAL LOW (ref 27.0–33.0)
MCHC: 32.8 g/dL (ref 32.0–36.0)
MCV: 80.8 fL (ref 80.0–100.0)
MPV: 9 fL (ref 7.5–12.5)
Monocytes Relative: 8 %
Neutro Abs: 3754 cells/uL (ref 1500–7800)
Neutrophils Relative %: 55.2 %
Platelets: 340 10*3/uL (ref 140–400)
RBC: 4.49 10*6/uL (ref 3.80–5.10)
RDW: 14.1 % (ref 11.0–15.0)
Total Lymphocyte: 33 %
WBC: 6.8 10*3/uL (ref 3.8–10.8)

## 2019-06-30 LAB — COMPLETE METABOLIC PANEL WITH GFR
AG Ratio: 1.1 (calc) (ref 1.0–2.5)
ALT: 9 U/L (ref 6–29)
AST: 15 U/L (ref 10–35)
Albumin: 3.8 g/dL (ref 3.6–5.1)
Alkaline phosphatase (APISO): 97 U/L (ref 31–125)
BUN: 8 mg/dL (ref 7–25)
CO2: 25 mmol/L (ref 20–32)
Calcium: 9.3 mg/dL (ref 8.6–10.2)
Chloride: 103 mmol/L (ref 98–110)
Creat: 0.78 mg/dL (ref 0.50–1.10)
GFR, Est African American: 103 mL/min/{1.73_m2} (ref >=60)
GFR, Est Non African American: 89 mL/min/{1.73_m2} (ref >=60)
Globulin: 3.4 g/dL (calc) (ref 1.9–3.7)
Glucose, Bld: 205 mg/dL — ABNORMAL HIGH (ref 65–99)
Potassium: 4.3 mmol/L (ref 3.5–5.3)
Sodium: 140 mmol/L (ref 135–146)
Total Bilirubin: 0.3 mg/dL (ref 0.2–1.2)
Total Protein: 7.2 g/dL (ref 6.1–8.1)

## 2019-06-30 LAB — LIPID PANEL
Cholesterol: 163 mg/dL (ref <200)
HDL: 56 mg/dL (ref >=50)
LDL Cholesterol (Calc): 85 mg/dL (calc)
Non-HDL Cholesterol (Calc): 107 mg/dL (calc) (ref <130)
Total CHOL/HDL Ratio: 2.9 (calc) (ref <5.0)
Triglycerides: 120 mg/dL (ref <150)

## 2019-06-30 LAB — HIV-1 RNA QUANT-NO REFLEX-BLD
HIV 1 RNA Quant: <20 copies/mL
HIV-1 RNA Quant, Log: <1.3 Log copies/mL

## 2019-06-30 LAB — RPR: RPR Ser Ql: NONREACTIVE

## 2019-07-06 ENCOUNTER — Other Ambulatory Visit: Payer: Self-pay

## 2019-07-06 ENCOUNTER — Ambulatory Visit (INDEPENDENT_AMBULATORY_CARE_PROVIDER_SITE_OTHER): Payer: Medicaid Other | Admitting: Internal Medicine

## 2019-07-06 ENCOUNTER — Encounter: Payer: Self-pay | Admitting: Internal Medicine

## 2019-07-06 VITALS — BP 127/83 | HR 72 | Temp 98.1°F | Wt 266.0 lb

## 2019-07-06 DIAGNOSIS — B20 Human immunodeficiency virus [HIV] disease: Secondary | ICD-10-CM

## 2019-07-06 DIAGNOSIS — E119 Type 2 diabetes mellitus without complications: Secondary | ICD-10-CM

## 2019-07-06 DIAGNOSIS — Z113 Encounter for screening for infections with a predominantly sexual mode of transmission: Secondary | ICD-10-CM

## 2019-07-06 DIAGNOSIS — Z5181 Encounter for therapeutic drug level monitoring: Secondary | ICD-10-CM | POA: Diagnosis not present

## 2019-07-06 DIAGNOSIS — I10 Essential (primary) hypertension: Secondary | ICD-10-CM | POA: Diagnosis not present

## 2019-07-06 NOTE — Assessment & Plan Note (Signed)
Screened negative 

## 2019-07-06 NOTE — Assessment & Plan Note (Signed)
WNL today 

## 2019-07-06 NOTE — Assessment & Plan Note (Signed)
Doing well,  No issues.  rtc 6 months.

## 2019-07-06 NOTE — Assessment & Plan Note (Signed)
Creat, LFTs ok 

## 2019-07-06 NOTE — Assessment & Plan Note (Signed)
Glucose was 200 and discussed with her. She will continue to work on her DM

## 2019-07-06 NOTE — Progress Notes (Signed)
   Subjective:    Patient ID: Latoya Roth, female    DOB: 1971-01-21, 48 y.o.   MRN: [id number]  HPI Here for follow up of HIV Continues on [place] and no missed doses.  CD4 of 717, viral load < 20.  No associated rash, diarrhea.  Recently fractured her ankle and is in a boot now on her right leg.  Gets her PAP from her PCP.   Review of Systems  Constitutional: Negative for fatigue.  Gastrointestinal: Negative for diarrhea and nausea.  Skin: Negative for rash.       Objective:   Physical Exam Constitutional:      General: She is not in acute distress.    Appearance: She is well-developed.  HENT:     Mouth/Throat:     Pharynx: No oropharyngeal exudate.  Eyes:     General: No scleral icterus. Cardiovascular:     Rate and Rhythm: Normal rate and regular rhythm.     Heart sounds: Normal heart sounds. No murmur.  Pulmonary:     Effort: Pulmonary effort is normal. No respiratory distress.     Breath sounds: Normal breath sounds. No wheezing.  Skin:    Findings: No rash.   SH: no tobacco       Assessment & Plan:

## 2019-07-14 ENCOUNTER — Other Ambulatory Visit: Payer: Self-pay | Admitting: Internal Medicine

## 2019-12-29 ENCOUNTER — Other Ambulatory Visit: Payer: Self-pay | Admitting: Internal Medicine

## 2020-01-06 ENCOUNTER — Other Ambulatory Visit: Payer: Medicaid Other

## 2020-01-06 ENCOUNTER — Other Ambulatory Visit: Payer: Self-pay

## 2020-01-06 DIAGNOSIS — B20 Human immunodeficiency virus [HIV] disease: Secondary | ICD-10-CM

## 2020-01-07 LAB — T-HELPER CELL (CD4) - (RCID CLINIC ONLY)
CD4 % Helper T Cell: 31 % — ABNORMAL LOW (ref 33–65)
CD4 T Cell Abs: 752 /uL (ref 400–1790)

## 2020-01-10 LAB — HIV-1 RNA QUANT-NO REFLEX-BLD
HIV 1 RNA Quant: <20 copies/mL — AB
HIV-1 RNA Quant, Log: <1.3 Log copies/mL — AB

## 2020-01-25 ENCOUNTER — Encounter: Payer: Self-pay | Admitting: Internal Medicine

## 2020-01-25 ENCOUNTER — Other Ambulatory Visit: Payer: Self-pay

## 2020-01-25 ENCOUNTER — Ambulatory Visit (INDEPENDENT_AMBULATORY_CARE_PROVIDER_SITE_OTHER): Payer: Medicaid Other | Admitting: Internal Medicine

## 2020-01-25 VITALS — BP 180/108 | HR 84 | Temp 97.9°F | Ht 67.0 in | Wt 254.0 lb

## 2020-01-25 DIAGNOSIS — Z113 Encounter for screening for infections with a predominantly sexual mode of transmission: Secondary | ICD-10-CM

## 2020-01-25 DIAGNOSIS — B20 Human immunodeficiency virus [HIV] disease: Secondary | ICD-10-CM | POA: Diagnosis not present

## 2020-01-25 DIAGNOSIS — Z7189 Other specified counseling: Secondary | ICD-10-CM | POA: Diagnosis not present

## 2020-01-25 NOTE — Assessment & Plan Note (Signed)
Doing great, no issues.  rtc 6 months

## 2020-01-25 NOTE — Progress Notes (Signed)
   Subjective:    Patient ID: Latoya Roth, female    DOB: 1971/11/27, 49 y.o.   MRN: [id number]  HPI  Here for follow up of HIV She continues on Biktarvy, no missed doses.  CD4 of 752, viral load < 20.  No new issues.  Gets flu shot and pneumovax from her PCP.  Does not want either here.     Review of Systems  Gastrointestinal: Negative for diarrhea and nausea.  Skin: Negative for rash.       Objective:   Physical Exam Constitutional:      General: She is not in acute distress.    Appearance: She is well-developed.  Eyes:     General: No scleral icterus. Cardiovascular:     Rate and Rhythm: Normal rate and regular rhythm.     Heart sounds: Normal heart sounds. No murmur.  Pulmonary:     Effort: Pulmonary effort is normal.  Skin:    Findings: No rash.   SH: no tobacco       Assessment & Plan:

## 2020-01-25 NOTE — Assessment & Plan Note (Signed)
I discussed the vaccine and encouraged her to get it when it is available.

## 2020-04-30 ENCOUNTER — Other Ambulatory Visit: Payer: Self-pay

## 2020-04-30 ENCOUNTER — Emergency Department (HOSPITAL_COMMUNITY): Payer: Medicaid Other

## 2020-04-30 ENCOUNTER — Emergency Department (HOSPITAL_COMMUNITY)
Admission: EM | Admit: 2020-04-30 | Discharge: 2020-04-30 | Disposition: A | Discharge status: Home / Self Care | Payer: Medicaid Other | Attending: Emergency Medicine | Admitting: Emergency Medicine

## 2020-04-30 ENCOUNTER — Encounter (HOSPITAL_COMMUNITY): Payer: Self-pay

## 2020-04-30 DIAGNOSIS — B20 Human immunodeficiency virus [HIV] disease: Secondary | ICD-10-CM | POA: Diagnosis not present

## 2020-04-30 DIAGNOSIS — Z794 Long term (current) use of insulin: Secondary | ICD-10-CM | POA: Insufficient documentation

## 2020-04-30 DIAGNOSIS — E119 Type 2 diabetes mellitus without complications: Secondary | ICD-10-CM | POA: Diagnosis not present

## 2020-04-30 DIAGNOSIS — R05 Cough: Secondary | ICD-10-CM | POA: Diagnosis present

## 2020-04-30 DIAGNOSIS — Z87891 Personal history of nicotine dependence: Secondary | ICD-10-CM | POA: Diagnosis not present

## 2020-04-30 DIAGNOSIS — Z20822 Contact with and (suspected) exposure to covid-19: Secondary | ICD-10-CM | POA: Diagnosis not present

## 2020-04-30 DIAGNOSIS — J45901 Unspecified asthma with (acute) exacerbation: Secondary | ICD-10-CM | POA: Diagnosis not present

## 2020-04-30 DIAGNOSIS — R0789 Other chest pain: Secondary | ICD-10-CM | POA: Diagnosis not present

## 2020-04-30 DIAGNOSIS — R0602 Shortness of breath: Secondary | ICD-10-CM | POA: Diagnosis not present

## 2020-04-30 DIAGNOSIS — I10 Essential (primary) hypertension: Secondary | ICD-10-CM | POA: Diagnosis not present

## 2020-04-30 DIAGNOSIS — Z79899 Other long term (current) drug therapy: Secondary | ICD-10-CM | POA: Insufficient documentation

## 2020-04-30 LAB — RESPIRATORY PANEL BY RT PCR (FLU A&B, COVID)
Influenza A by PCR: NEGATIVE
Influenza B by PCR: NEGATIVE
SARS Coronavirus 2 by RT PCR: NEGATIVE

## 2020-04-30 MED ORDER — PREDNISONE 20 MG PO TABS
40.0000 mg | ORAL_TABLET | Freq: Every day | ORAL | 0 refills | Status: AC
Start: 2020-05-01 — End: 2020-05-05

## 2020-04-30 MED ORDER — PREDNISONE 20 MG PO TABS
40.0000 mg | ORAL_TABLET | Freq: Once | ORAL | Status: AC
  Administered 2020-04-30: 40 mg via ORAL
  Filled 2020-04-30: qty 2

## 2020-04-30 MED ORDER — ALBUTEROL SULFATE HFA 108 (90 BASE) MCG/ACT IN AERS
4.0000 | INHALATION_SPRAY | Freq: Once | RESPIRATORY_TRACT | Status: AC
  Administered 2020-04-30: 4 via RESPIRATORY_TRACT
  Filled 2020-04-30: qty 6.7

## 2020-04-30 NOTE — ED Notes (Signed)
Patient verbalizes understanding of discharge instructions . Opportunity for questions and answers were provided . Armband removed by staff ,Pt discharged from ED. W/C  offered at D/C  and Declined W/C at D/C and was escorted to lobby by RN.  

## 2020-04-30 NOTE — ED Triage Notes (Signed)
Onset 2-3 days pt c/o "bronchitis" that is not improving.  Has had several times since weather has been changing.  Voice sounds hoarse.  Denies close contacts with anyone sick.  Has used inhalers with no relief.  Coughing has caused several episodes of vomiting.  Cough is dry.

## 2020-04-30 NOTE — Discharge Instructions (Signed)
Please schedule follow-up appointment with your primary doctor for recheck early next week.  If you develop difficulty in breathing, chest pain, other new concerning symptom, return to ER for reassessment.  Recommend completing course of steroids as discussed.  Use your albuterol inhaler as needed for further wheezing.

## 2020-05-01 NOTE — ED Provider Notes (Signed)
[facility] EMERGENCY DEPARTMENT Provider Note   CSN: [id number] Arrival date & time: 04/30/20  1213     History Chief Complaint  Patient presents with  . Asthma    Latoya Roth is a 49 y.o. female.  Presents to the emergency department with chief complaint of cough, wheezing.  Concerned she has bronchitis.  Symptoms have been ongoing for the past few days, worsened over the past day or so.  States that she occasionally feels a slight chest pain when she has bad coughing episode, also has episodes of some brief shortness of breath when she has a coughing episode.  Not coughing anything up.  Currently denies any chest pain or difficulty breathing.  Has not had any fever associated with the symptoms today.  Denies any Covid contacts.  Has not gotten the vaccine.    History asthma, states that she has been using her albuterol inhaler at home, which has helped.  Notable past medical history for HIV, per review of recent ID note, patient is very well controlled, last CD4 count 752.  Patient denies any recent missed doses of her HIV medications.  HPI     Past Medical History:  Diagnosis Date  . Anxiety   . Arthritis   . Asthma   . Bell's palsy   . BV (bacterial vaginosis)   . Depression   . Diabetes mellitus   . HIV disease ([place])   . Hypertension   . Obesity     Patient Active Problem List   Diagnosis Date Noted  . Counseled about COVID-19 virus infection 01/25/2020  . Screening examination for venereal disease 12/31/2017  . Encounter for long-term (current) use of high-risk medication 12/31/2017  . Medication monitoring encounter 05/06/2017  . Homelessness 04/04/2017  . Type 2 diabetes mellitus without complications ([place]) 123456  . Human immunodeficiency virus I infection ([place]) 03/13/2017  . Hypertension 04/27/2016  . Generalized weakness 04/27/2016  . Asthma 04/27/2016  . BMI 40.0-44.9, adult ([place]) 01/15/2014    Past Surgical History:    Procedure Laterality Date  . CHOLECYSTECTOMY       OB History    Gravida  3   Para  2   Term  2   Preterm  0   AB  0   Living  2     SAB  0   TAB  0   Ectopic  0   Multiple  0   Live Births  2           Family History  Problem Relation Age of Onset  . Hypertension Mother   . Cancer Son     Social History   Tobacco Use  . Smoking status: Former Research scientist (life sciences)  . Smokeless tobacco: Never Used  Substance Use Topics  . Alcohol use: Yes    Alcohol/week: 14.0 standard drinks    Types: 14 Cans of beer per week    Comment: 2/day  . Drug use: No    Home Medications Prior to Admission medications   Medication Sig Start Date End Date Taking? Authorizing Provider  albuterol (PROVENTIL HFA;VENTOLIN HFA) 108 (90 BASE) MCG/ACT inhaler Inhale 2 puffs into the lungs every 6 (six) hours as needed for wheezing or shortness of breath.    [provider]  albuterol (PROVENTIL) (2.5 MG/3ML) 0.083% nebulizer solution Take 2.5 mg by nebulization every 6 (six) hours as needed. For shortness of breath    [provider]  albuterol-ipratropium (COMBIVENT) 18-103 MCG/ACT inhaler Inhale  2 puffs into the lungs every 6 (six) hours as needed for shortness of breath. Reported on 04/27/2016    [provider]  BIKTARVY 50-200-25 MG TABS tablet TAKE ONE TABLET BY MOUTH DAILY 12/29/19   Comer, Okey Regal, MD  budesonide-formoterol [facility]) 160-4.5 MCG/ACT inhaler Inhale 2 puffs into the lungs 2 (two) times daily.    [provider]  cetirizine (ZYRTEC) 10 MG tablet Take 10 mg by mouth daily.    [provider]  glimepiride (AMARYL) 4 MG tablet Take 4 mg by mouth daily with breakfast.    [provider]  hydrOXYzine (ATARAX/VISTARIL) 25 MG tablet Take 1 tablet (25 mg total) by mouth 3 (three) times daily as needed for itching. 05/19/18   Domenic Moras, PA-C  Insulin Glargine (LANTUS SOLOSTAR) 100 UNIT/ML Solostar Pen Inject 20 Units into the skin  daily at 10 pm. 04/30/16   Eugenie Filler, MD  lisinopril (PRINIVIL,ZESTRIL) 10 MG tablet Take 10 mg by mouth daily.    [provider]  meloxicam (MOBIC) 7.5 MG tablet Take 1 tablet (7.5 mg total) by mouth daily. 01/24/18   Zigmund Gottron, NP  metFORMIN (GLUCOPHAGE) 500 MG tablet Take 500 mg by mouth 2 (two) times daily with a meal.    [provider]  Multiple Vitamin (MULTIVITAMIN WITH MINERALS) TABS tablet Take 1 tablet by mouth daily.    [provider]  ondansetron (ZOFRAN) 4 MG tablet TAKE 1 TABLET (4 MG TOTAL) BY MOUTH DAILY AS NEEDED FOR NAUSEA OR VOMITING (30 MINUTES PRIOR TO GENVOYA AS NEEDED). 12/18/17   Thayer Headings, MD  oxyCODONE-acetaminophen (PERCOCET/ROXICET) 5-325 MG tablet Take 1-2 tablets by mouth every 6 (six) hours as needed for severe pain. 07/01/18   Petrucelli, Samantha R, PA-C  potassium chloride SA (K-DUR,KLOR-CON) 20 MEQ tablet Take 1 tablet (20 mEq total) by mouth daily. 05/01/16   Eugenie Filler, MD  predniSONE (DELTASONE) 20 MG tablet Take 3 tablets (60 mg total) by mouth daily. 05/17/17   Ward, Delice Bison, DO  predniSONE (DELTASONE) 20 MG tablet Take 2 tablets (40 mg total) by mouth daily for 4 days. 05/01/20 05/05/20  Lucrezia Starch, MD    Allergies    Penicillins, Shellfish allergy, and Amoxicillin  Review of Systems   Review of Systems  Constitutional: Negative for chills and fever.  HENT: Negative for ear pain and sore throat.   Eyes: Negative for pain and visual disturbance.  Respiratory: Positive for cough and shortness of breath.   Cardiovascular: Positive for chest pain. Negative for palpitations.  Gastrointestinal: Negative for abdominal pain and vomiting.  Genitourinary: Negative for dysuria and hematuria.  Musculoskeletal: Negative for arthralgias and back pain.  Skin: Negative for color change and rash.  Neurological: Negative for seizures and syncope.  All other systems reviewed and are negative.   Physical  Exam Updated Vital Signs BP 128/68 (BP Location: Right Arm)   Pulse 81   Temp 98.3 F (36.8 C) (Oral)   Resp 15   Ht 5\' 7"  (1.702 m)   Wt 109.8 kg   LMP 03/28/2020   SpO2 98%   Breastfeeding Unknown   BMI 37.90 kg/m   Physical Exam Vitals and nursing note reviewed.  Constitutional:      General: She is not in acute distress.    Appearance: She is well-developed.  HENT:     Head: Normocephalic and atraumatic.  Eyes:     Conjunctiva/sclera: Conjunctivae normal.  Cardiovascular:  Rate and Rhythm: Normal rate and regular rhythm.     Heart sounds: No murmur.  Pulmonary:     Effort: Pulmonary effort is normal. No respiratory distress.     Comments: Mild end expiratory wheeze, no respiratory distress, no accessory muscle use, speaks in full sentences, frequent cough Abdominal:     Palpations: Abdomen is soft.     Tenderness: There is no abdominal tenderness.  Musculoskeletal:     Cervical back: Neck supple.  Skin:    General: Skin is warm and dry.  Neurological:     General: No focal deficit present.     Mental Status: She is alert.  Psychiatric:        Mood and Affect: Mood normal.        Behavior: Behavior normal.     ED Results / Procedures / Treatments   Labs (all labs ordered are listed, but only abnormal results are displayed) Labs Reviewed  RESPIRATORY PANEL BY RT PCR (FLU A&B, COVID)    EKG EKG Interpretation  Date/Time:  Saturday Apr 30 2020 14:41:22 EDT Ventricular Rate:  79 PR Interval:    QRS Duration: 100 QT Interval:  410 QTC Calculation: 470 R Axis:   -5 Text Interpretation: Sinus rhythm Abnormal R-wave progression, early transition No significant change since last tracing Confirmed by Wandra Arthurs [phone]) on 05/01/2020 8:41:17 AM   Radiology DG Chest Portable 1 View  Result Date: 04/30/2020 CLINICAL DATA:  Cough EXAM: PORTABLE CHEST 1 VIEW COMPARISON:  May 17, 2017 FINDINGS: Lungs are clear. Heart size and pulmonary vascularity are normal.  No adenopathy. No bone lesions. IMPRESSION: Lungs clear.  Cardiac silhouette within normal limits. Electronically Signed   By: Lowella Grip III M.D.   On: 04/30/2020 13:41    Procedures Procedures (including critical care time)  Medications Ordered in ED Medications  albuterol (VENTOLIN HFA) 108 (90 Base) MCG/ACT inhaler 4 puff (4 puffs Inhalation Given 04/30/20 1330)  predniSONE (DELTASONE) tablet 40 mg (40 mg Oral Given 04/30/20 1330)    ED Course  I have reviewed the triage vital signs and the nursing notes.  Pertinent labs & imaging results that were available during my care of the patient were reviewed by me and considered in my medical decision making (see chart for details).    MDM Rules/Calculators/A&P                      49 year old lady who presents to ER with concern for cough.  Notable history for HIV and asthma.  HIV well controlled with normal CD4 count.  No fever.  CXR negative for pneumonia.  Had some shortness of breath and occasional chest pain associated with frequent cough.  Based on description of symptoms, suspect this is related to her frequent cough and low suspicion for other acute cardiopulmonary process besides asthma.  EKG was without acute ischemic changes.  Suspect patient is having some exacerbation, currently relatively mild.  Symptoms improved after receiving additional albuterol in ER.  Will give course of steroids.  Recommend recheck with primary doctor, she has no hypoxia, no tachypnea, no respiratory distress, she can be managed in the outpatient setting.  Reviewed precautions and discharged home.   After the discussed management above, the patient was determined to be safe for discharge.  The patient was in agreement with this plan and all questions regarding their care were answered.  ED return precautions were discussed and the patient will return to the ED with any  significant worsening of condition.  Final Clinical Impression(s) / ED  Diagnoses Final diagnoses:  Exacerbation of asthma, unspecified asthma severity, unspecified whether persistent    Rx / DC Orders ED Discharge Orders         Ordered    predniSONE (DELTASONE) 20 MG tablet  Daily     04/30/20 1427           Lucrezia Starch, MD 05/01/20 1623

## 2020-05-11 ENCOUNTER — Other Ambulatory Visit: Payer: Self-pay | Admitting: Internal Medicine

## 2020-07-25 ENCOUNTER — Other Ambulatory Visit: Payer: Medicaid Other

## 2020-07-25 ENCOUNTER — Other Ambulatory Visit: Payer: Self-pay

## 2020-07-25 DIAGNOSIS — B20 Human immunodeficiency virus [HIV] disease: Secondary | ICD-10-CM

## 2020-07-25 DIAGNOSIS — Z113 Encounter for screening for infections with a predominantly sexual mode of transmission: Secondary | ICD-10-CM

## 2020-07-26 LAB — T-HELPER CELL (CD4) - (RCID CLINIC ONLY)
CD4 % Helper T Cell: 33 % (ref 33–65)
CD4 T Cell Abs: 718 /uL (ref 400–1790)

## 2020-08-01 LAB — CBC WITH DIFFERENTIAL/PLATELET
Absolute Monocytes: 449 cells/uL (ref 200–950)
Basophils Absolute: 20 cells/uL (ref 0–200)
Basophils Relative: 0.3 %
Eosinophils Absolute: 79 cells/uL (ref 15–500)
Eosinophils Relative: 1.2 %
HCT: 37.5 % (ref 35.0–45.0)
Hemoglobin: 12.1 g/dL (ref 11.7–15.5)
Lymphs Abs: 2198 cells/uL (ref 850–3900)
MCH: 25.7 pg — ABNORMAL LOW (ref 27.0–33.0)
MCHC: 32.3 g/dL (ref 32.0–36.0)
MCV: 79.8 fL — ABNORMAL LOW (ref 80.0–100.0)
MPV: 9.5 fL (ref 7.5–12.5)
Monocytes Relative: 6.8 %
Neutro Abs: 3854 cells/uL (ref 1500–7800)
Neutrophils Relative %: 58.4 %
Platelets: 394 10*3/uL (ref 140–400)
RBC: 4.7 10*6/uL (ref 3.80–5.10)
RDW: 13.8 % (ref 11.0–15.0)
Total Lymphocyte: 33.3 %
WBC: 6.6 10*3/uL (ref 3.8–10.8)

## 2020-08-01 LAB — RPR: RPR Ser Ql: NONREACTIVE

## 2020-08-01 LAB — HIV-1 RNA QUANT-NO REFLEX-BLD
HIV 1 RNA Quant: <20 Copies/mL
HIV-1 RNA Quant, Log: <1.3 Log cps/mL

## 2020-08-08 ENCOUNTER — Encounter: Payer: Self-pay | Admitting: Internal Medicine

## 2020-08-08 ENCOUNTER — Other Ambulatory Visit: Payer: Self-pay

## 2020-08-08 ENCOUNTER — Ambulatory Visit (INDEPENDENT_AMBULATORY_CARE_PROVIDER_SITE_OTHER): Payer: Medicaid Other | Admitting: Internal Medicine

## 2020-08-08 VITALS — BP 142/87 | HR 69 | Wt 257.0 lb

## 2020-08-08 DIAGNOSIS — B20 Human immunodeficiency virus [HIV] disease: Secondary | ICD-10-CM

## 2020-08-08 DIAGNOSIS — Z113 Encounter for screening for infections with a predominantly sexual mode of transmission: Secondary | ICD-10-CM | POA: Diagnosis not present

## 2020-08-08 DIAGNOSIS — Z7189 Other specified counseling: Secondary | ICD-10-CM | POA: Diagnosis not present

## 2020-08-08 NOTE — Assessment & Plan Note (Signed)
Screened negative 

## 2020-08-08 NOTE — Assessment & Plan Note (Signed)
She continues to do well with no concerns.  She will continue with Biktarvy and rtc 6 months.

## 2020-08-08 NOTE — Progress Notes (Signed)
   Subjective:    Patient ID: Latoya Roth, female    DOB: 10-Mar-1971, 49 y.o.   MRN: [id number]  HPI  Here for follow up of HIV She continues on Biktarvy, no missed doses.  CD4 of 718, viral load < 20.  No new issues.  Has not had nor willing to get the COVID vaccine. No new complaints.    Review of Systems  Gastrointestinal: Negative for diarrhea and nausea.  Skin: Negative for rash.       Objective:   Physical Exam Constitutional:      General: She is not in acute distress.    Appearance: She is well-developed.  Eyes:     General: No scleral icterus. Cardiovascular:     Rate and Rhythm: Normal rate and regular rhythm.     Heart sounds: Normal heart sounds. No murmur heard.   Pulmonary:     Effort: Pulmonary effort is normal.  Skin:    Findings: No rash.   SH: no tobacco       Assessment & Plan:

## 2020-08-08 NOTE — Assessment & Plan Note (Signed)
I discussed again the importance of the vaccine but she continues to refuse/not consider.

## 2020-09-07 ENCOUNTER — Other Ambulatory Visit: Payer: Self-pay | Admitting: Internal Medicine

## 2020-11-25 ENCOUNTER — Ambulatory Visit: Payer: Medicaid Other | Admitting: Podiatry

## 2021-01-23 ENCOUNTER — Other Ambulatory Visit: Payer: Self-pay

## 2021-01-23 ENCOUNTER — Other Ambulatory Visit (HOSPITAL_COMMUNITY)
Admission: RE | Admit: 2021-01-23 | Discharge: 2021-01-23 | Disposition: A | Discharge status: Home / Self Care | Payer: Medicaid Other | Source: Ambulatory Visit | Attending: Internal Medicine | Admitting: Internal Medicine

## 2021-01-23 ENCOUNTER — Other Ambulatory Visit: Payer: Medicaid Other

## 2021-01-23 DIAGNOSIS — Z113 Encounter for screening for infections with a predominantly sexual mode of transmission: Secondary | ICD-10-CM

## 2021-01-23 DIAGNOSIS — B20 Human immunodeficiency virus [HIV] disease: Secondary | ICD-10-CM | POA: Insufficient documentation

## 2021-01-24 LAB — T-HELPER CELL (CD4) - (RCID CLINIC ONLY)
CD4 % Helper T Cell: 34 % (ref 33–65)
CD4 T Cell Abs: 821 /uL (ref 400–1790)

## 2021-01-24 LAB — URINE CYTOLOGY ANCILLARY ONLY
Chlamydia: NEGATIVE
Comment: NEGATIVE
Comment: NORMAL
Neisseria Gonorrhea: NEGATIVE

## 2021-01-26 LAB — HIV-1 RNA QUANT-NO REFLEX-BLD
HIV 1 RNA Quant: 87 Copies/mL — ABNORMAL HIGH
HIV-1 RNA Quant, Log: 1.94 Log cps/mL — ABNORMAL HIGH

## 2021-02-06 ENCOUNTER — Encounter: Payer: Medicaid Other | Admitting: Internal Medicine

## 2021-02-23 ENCOUNTER — Other Ambulatory Visit: Payer: Self-pay | Admitting: Internal Medicine

## 2021-02-23 NOTE — Telephone Encounter (Signed)
Has appt 3/4

## 2021-02-24 ENCOUNTER — Encounter: Payer: Self-pay | Admitting: Internal Medicine

## 2021-02-24 ENCOUNTER — Other Ambulatory Visit: Payer: Self-pay

## 2021-02-24 ENCOUNTER — Ambulatory Visit (INDEPENDENT_AMBULATORY_CARE_PROVIDER_SITE_OTHER): Payer: Medicaid Other | Admitting: Internal Medicine

## 2021-02-24 VITALS — BP 158/95 | HR 89 | Temp 98.2°F | Wt 252.0 lb

## 2021-02-24 DIAGNOSIS — Z113 Encounter for screening for infections with a predominantly sexual mode of transmission: Secondary | ICD-10-CM

## 2021-02-24 DIAGNOSIS — Z7189 Other specified counseling: Secondary | ICD-10-CM | POA: Diagnosis not present

## 2021-02-24 DIAGNOSIS — B20 Human immunodeficiency virus [HIV] disease: Secondary | ICD-10-CM

## 2021-02-24 MED ORDER — BIKTARVY 50-200-25 MG PO TABS: 1.0000 | ORAL_TABLET | Freq: Every day | ORAL | 11 refills | Status: DC

## 2021-02-24 NOTE — Assessment & Plan Note (Signed)
I congratulated her on getting the vaccine and reminded her of the booster when it is time.

## 2021-02-24 NOTE — Assessment & Plan Note (Signed)
Doing well.  A little bit of detectable virus but not concerning.  rtc in 6 months.

## 2021-02-24 NOTE — Progress Notes (Signed)
   Subjective:    Patient ID: Haskel Khan, female    DOB: 04/11/1971, 50 y.o.   MRN: [id number]  HPI Here for follow up of HIV She continues on Biktarvy and no missed doses.  CD4 of 821 and viral load 87.  No complaints today  She did get the COVID vaccine since her last visit.   Review of Systems  Constitutional: Negative for fever.  Gastrointestinal: Negative for diarrhea and nausea.  Skin: Negative for rash.       Objective:   Physical Exam Eyes:     General: No scleral icterus. Cardiovascular:     Rate and Rhythm: Normal rate and regular rhythm.  Pulmonary:     Effort: Pulmonary effort is normal.  Neurological:     General: No focal deficit present.     Mental Status: She is alert.  Psychiatric:        Mood and Affect: Mood normal.           Assessment & Plan:

## 2021-04-28 ENCOUNTER — Other Ambulatory Visit: Payer: Self-pay

## 2021-04-28 ENCOUNTER — Ambulatory Visit (INDEPENDENT_AMBULATORY_CARE_PROVIDER_SITE_OTHER): Payer: Medicaid Other | Admitting: Podiatry

## 2021-04-28 ENCOUNTER — Ambulatory Visit (INDEPENDENT_AMBULATORY_CARE_PROVIDER_SITE_OTHER): Payer: Medicaid Other

## 2021-04-28 DIAGNOSIS — M7752 Other enthesopathy of left foot: Secondary | ICD-10-CM

## 2021-04-28 DIAGNOSIS — M79672 Pain in left foot: Secondary | ICD-10-CM

## 2021-04-28 DIAGNOSIS — M779 Enthesopathy, unspecified: Secondary | ICD-10-CM | POA: Diagnosis not present

## 2021-04-28 DIAGNOSIS — M778 Other enthesopathies, not elsewhere classified: Secondary | ICD-10-CM | POA: Diagnosis not present

## 2021-05-01 ENCOUNTER — Other Ambulatory Visit: Payer: Self-pay | Admitting: Podiatry

## 2021-05-01 DIAGNOSIS — M779 Enthesopathy, unspecified: Secondary | ICD-10-CM

## 2021-05-03 ENCOUNTER — Encounter: Payer: Self-pay | Admitting: Podiatry

## 2021-05-03 NOTE — Progress Notes (Signed)
Subjective:  Patient ID: Latoya Roth, female    DOB: 1971/02/14,  MRN: [id number]  No chief complaint on file.   50 y.o. female presents with the above complaint.  Patient presents with complaint of dorsal midfoot pain.  She states that is painful to walk on it.  She states that she is a diabetic with last A1c of 12.7.  She has not been very compliant with controlled sugar.  She states is always painful and swollen.  She denies seeing anyone else prior to seeing me.  She would like to discuss treatment options for this dorsal foot pain on the left side.  She has not tried any conservative treatment options.  No acute acute trauma recalled.   Review of Systems: Negative except as noted in the HPI. Denies N/V/F/Ch.  Past Medical History:  Diagnosis Date  . Anxiety   . Arthritis   . Asthma   . Bell's palsy   . BV (bacterial vaginosis)   . Depression   . Diabetes mellitus   . HIV disease ([place])   . Hypertension   . Obesity     Current Outpatient Medications:  .  albuterol (PROVENTIL HFA;VENTOLIN HFA) 108 (90 BASE) MCG/ACT inhaler, Inhale 2 puffs into the lungs every 6 (six) hours as needed for wheezing or shortness of breath., Disp: , Rfl:  .  albuterol-ipratropium (COMBIVENT) 18-103 MCG/ACT inhaler, Inhale 2 puffs into the lungs every 6 (six) hours as needed for shortness of breath. Reported on 04/27/2016, Disp: , Rfl:  .  bictegravir-emtricitabine-tenofovir AF (BIKTARVY) 50-200-25 MG TABS tablet, Take 1 tablet by mouth daily., Disp: 30 tablet, Rfl: 11 .  budesonide-formoterol (SYMBICORT) 160-4.5 MCG/ACT inhaler, Inhale 2 puffs into the lungs 2 (two) times daily., Disp: , Rfl:  .  cetirizine (ZYRTEC) 10 MG tablet, Take 10 mg by mouth daily., Disp: , Rfl:  .  escitalopram (LEXAPRO) 20 MG tablet, Take by mouth., Disp: , Rfl:  .  glimepiride (AMARYL) 4 MG tablet, Take 4 mg by mouth daily with breakfast., Disp: , Rfl:  .  hydrOXYzine (ATARAX/VISTARIL) 25 MG tablet, Take 1 tablet (25  mg total) by mouth 3 (three) times daily as needed for itching., Disp: 30 tablet, Rfl: 0 .  lisinopril (PRINIVIL,ZESTRIL) 10 MG tablet, Take 10 mg by mouth daily., Disp: , Rfl:  .  metFORMIN (GLUCOPHAGE) 500 MG tablet, Take 500 mg by mouth 2 (two) times daily with a meal., Disp: , Rfl:   Social History   Tobacco Use  Smoking Status Former Smoker  Smokeless Tobacco Never Used    Allergies  Allergen Reactions  . Penicillins Anaphylaxis    Has patient had a PCN reaction causing immediate rash, facial/tongue/throat swelling, SOB or lightheadedness with hypotension: Yes Has patient had a PCN reaction causing severe rash involving mucus membranes or skin necrosis: No Has patient had a PCN reaction that required hospitalization No Has patient had a PCN reaction occurring within the last 10 years: No If all of the above answers are "NO", then may proceed with Cephalosporin use.   . Shellfish Allergy Anaphylaxis, Hives and Swelling  . Amoxicillin Rash   Objective:  There were no vitals filed for this visit. There is no height or weight on file to calculate BMI. Constitutional Well developed. Well nourished.  Vascular Dorsalis pedis pulses palpable bilaterally. Posterior tibial pulses palpable bilaterally. Capillary refill normal to all digits.  No cyanosis or clubbing noted. Pedal hair growth normal.  Neurologic Normal speech. Oriented to person, place, and  time. Epicritic sensation to light touch grossly present bilaterally.  Dermatologic Nails well groomed and normal in appearance. No open wounds. No skin lesions.  Orthopedic:  Pain on palpation to the dorsal midfoot.  Pain with resisted dorsiflexion of the digits.  No pain with resisted plantarflexion of the digits.  Negative piano key test.  No concern for Lisfranc injury.   Radiographs: 3 views of skeletally mature adult left foot:Mild midfoot arthritis noted.  Mild ankle arthritis noted as well.  No other bony abnormalities  identified. Assessment:   1. Extensor tendinitis of foot    Plan:  Patient was evaluated and treated and all questions answered.  Left extensor tendinitis foot -I explained the patient the etiology of tendinitis and various treatment options were extensively discussed.  Given the amount of pain she is having she would benefit from cam boot immobilization.  Cam boot was dispensed.  If this does not help we will consider getting an MRI versus steroid injection.  Patient states understanding.  No follow-ups on file.

## 2021-06-21 ENCOUNTER — Other Ambulatory Visit: Payer: Self-pay | Admitting: Family

## 2021-06-21 DIAGNOSIS — B20 Human immunodeficiency virus [HIV] disease: Secondary | ICD-10-CM

## 2021-08-23 ENCOUNTER — Other Ambulatory Visit: Payer: Self-pay

## 2021-08-23 ENCOUNTER — Other Ambulatory Visit (HOSPITAL_COMMUNITY)
Admission: RE | Admit: 2021-08-23 | Discharge: 2021-08-23 | Disposition: A | Discharge status: Home / Self Care | Payer: Medicaid Other | Source: Ambulatory Visit | Attending: Internal Medicine | Admitting: Internal Medicine

## 2021-08-23 ENCOUNTER — Other Ambulatory Visit: Payer: Medicaid Other

## 2021-08-23 DIAGNOSIS — Z113 Encounter for screening for infections with a predominantly sexual mode of transmission: Secondary | ICD-10-CM

## 2021-08-23 DIAGNOSIS — B20 Human immunodeficiency virus [HIV] disease: Secondary | ICD-10-CM

## 2021-08-24 LAB — T-HELPER CELL (CD4) - (RCID CLINIC ONLY)
CD4 % Helper T Cell: 35 % (ref 33–65)
CD4 T Cell Abs: 681 /uL (ref 400–1790)

## 2021-08-24 LAB — URINE CYTOLOGY ANCILLARY ONLY
Chlamydia: NEGATIVE
Comment: NEGATIVE
Comment: NORMAL
Neisseria Gonorrhea: NEGATIVE

## 2021-08-25 LAB — CBC WITH DIFFERENTIAL/PLATELET
Absolute Monocytes: 446 cells/uL (ref 200–950)
Basophils Absolute: 37 cells/uL (ref 0–200)
Basophils Relative: 0.6 %
Eosinophils Absolute: 149 cells/uL (ref 15–500)
Eosinophils Relative: 2.4 %
HCT: 39.3 % (ref 35.0–45.0)
Hemoglobin: 12.6 g/dL (ref 11.7–15.5)
Lymphs Abs: 2269 cells/uL (ref 850–3900)
MCH: 25.8 pg — ABNORMAL LOW (ref 27.0–33.0)
MCHC: 32.1 g/dL (ref 32.0–36.0)
MCV: 80.4 fL (ref 80.0–100.0)
MPV: 9.3 fL (ref 7.5–12.5)
Monocytes Relative: 7.2 %
Neutro Abs: 3298 cells/uL (ref 1500–7800)
Neutrophils Relative %: 53.2 %
Platelets: 377 10*3/uL (ref 140–400)
RBC: 4.89 10*6/uL (ref 3.80–5.10)
RDW: 13 % (ref 11.0–15.0)
Total Lymphocyte: 36.6 %
WBC: 6.2 10*3/uL (ref 3.8–10.8)

## 2021-08-25 LAB — COMPLETE METABOLIC PANEL WITH GFR
AG Ratio: 1.2 (calc) (ref 1.0–2.5)
ALT: 8 U/L (ref 6–29)
AST: 9 U/L — ABNORMAL LOW (ref 10–35)
Albumin: 3.9 g/dL (ref 3.6–5.1)
Alkaline phosphatase (APISO): 100 U/L (ref 37–153)
BUN: 9 mg/dL (ref 7–25)
CO2: 30 mmol/L (ref 20–32)
Calcium: 9.3 mg/dL (ref 8.6–10.4)
Chloride: 99 mmol/L (ref 98–110)
Creat: 0.69 mg/dL (ref 0.50–1.03)
Globulin: 3.3 g/dL (calc) (ref 1.9–3.7)
Glucose, Bld: 242 mg/dL — ABNORMAL HIGH (ref 65–99)
Potassium: 4.3 mmol/L (ref 3.5–5.3)
Sodium: 135 mmol/L (ref 135–146)
Total Bilirubin: 0.3 mg/dL (ref 0.2–1.2)
Total Protein: 7.2 g/dL (ref 6.1–8.1)
eGFR: 105 mL/min/{1.73_m2} (ref >=60)

## 2021-08-25 LAB — HIV-1 RNA QUANT-NO REFLEX-BLD
HIV 1 RNA Quant: NOT DETECTED Copies/mL
HIV-1 RNA Quant, Log: NOT DETECTED Log cps/mL

## 2021-08-25 LAB — RPR: RPR Ser Ql: NONREACTIVE

## 2021-09-04 ENCOUNTER — Other Ambulatory Visit: Payer: Self-pay | Admitting: Internal Medicine

## 2021-09-05 ENCOUNTER — Other Ambulatory Visit: Payer: Self-pay | Admitting: Internal Medicine

## 2021-09-05 NOTE — Telephone Encounter (Signed)
Has appt 9/14.

## 2021-09-06 ENCOUNTER — Encounter: Payer: Medicaid Other | Admitting: Internal Medicine

## 2021-09-08 ENCOUNTER — Encounter: Payer: Self-pay | Admitting: Internal Medicine

## 2021-09-11 ENCOUNTER — Other Ambulatory Visit: Payer: Self-pay

## 2021-09-11 ENCOUNTER — Encounter: Payer: Self-pay | Admitting: Internal Medicine

## 2021-09-11 ENCOUNTER — Ambulatory Visit (INDEPENDENT_AMBULATORY_CARE_PROVIDER_SITE_OTHER): Payer: Medicaid Other | Admitting: Internal Medicine

## 2021-09-11 VITALS — BP 161/89 | HR 70 | Temp 98.2°F | Wt 245.6 lb

## 2021-09-11 DIAGNOSIS — B20 Human immunodeficiency virus [HIV] disease: Secondary | ICD-10-CM

## 2021-09-11 DIAGNOSIS — Z113 Encounter for screening for infections with a predominantly sexual mode of transmission: Secondary | ICD-10-CM

## 2021-09-11 DIAGNOSIS — Z5181 Encounter for therapeutic drug level monitoring: Secondary | ICD-10-CM | POA: Diagnosis not present

## 2021-09-11 MED ORDER — BIKTARVY 50-200-25 MG PO TABS: 1.0000 | ORAL_TABLET | Freq: Every day | ORAL | 11 refills | Status: DC

## 2021-09-11 NOTE — Assessment & Plan Note (Signed)
Screened negative 

## 2021-09-11 NOTE — Assessment & Plan Note (Signed)
Creat, LFTs good.  Blood sugar a bit high and followed by her PCP with known diabetes.

## 2021-09-11 NOTE — Assessment & Plan Note (Signed)
She continues to do well on Biktarvy and no concerns.  Refills sent and she can rtc in 6 months.

## 2021-09-11 NOTE — Progress Notes (Signed)
   Subjective:    Patient ID: Latoya Roth, female    DOB: [birth year], 50 y.o.   MRN: [id number]  HPI Here for follow up of HIV She continues on biktarvy with no missed doses.  CD4 681 and viral load not detected.  Some issues with 'bronchitis' and has inhalers.  No new concerns.      Review of Systems  Constitutional:  Negative for fatigue.  Gastrointestinal:  Negative for diarrhea and nausea.      Objective:   Physical Exam Eyes:     General: No scleral icterus. Pulmonary:     Effort: Pulmonary effort is normal.  Neurological:     General: No focal deficit present.     Mental Status: She is alert.  Psychiatric:        Mood and Affect: Mood normal.   SH: No tobacco       Assessment & Plan:

## 2022-02-05 ENCOUNTER — Other Ambulatory Visit: Payer: Self-pay | Admitting: Physician Assistant

## 2022-02-05 DIAGNOSIS — M7989 Other specified soft tissue disorders: Secondary | ICD-10-CM

## 2022-02-13 ENCOUNTER — Ambulatory Visit
Admission: RE | Admit: 2022-02-13 | Discharge: 2022-02-13 | Disposition: A | Discharge status: Home / Self Care | Payer: Medicaid Other | Source: Ambulatory Visit | Attending: Physician Assistant | Admitting: Physician Assistant

## 2022-02-13 DIAGNOSIS — M7989 Other specified soft tissue disorders: Secondary | ICD-10-CM

## 2022-02-21 ENCOUNTER — Other Ambulatory Visit: Payer: Medicaid Other

## 2022-02-21 ENCOUNTER — Other Ambulatory Visit: Payer: Self-pay

## 2022-02-21 DIAGNOSIS — B20 Human immunodeficiency virus [HIV] disease: Secondary | ICD-10-CM

## 2022-02-21 DIAGNOSIS — Z5181 Encounter for therapeutic drug level monitoring: Secondary | ICD-10-CM

## 2022-02-24 LAB — HIV-1 RNA QUANT-NO REFLEX-BLD
HIV 1 RNA Quant: NOT DETECTED Copies/mL
HIV-1 RNA Quant, Log: NOT DETECTED Log cps/mL

## 2022-02-24 LAB — T-HELPER CELLS (CD4) COUNT (NOT AT ARMC)
Absolute CD4: 817 cells/uL (ref 490–1740)
CD4 T Helper %: 34 % (ref 30–61)
Total lymphocyte count: 2401 cells/uL (ref 850–3900)

## 2022-03-07 ENCOUNTER — Encounter: Payer: Medicaid Other | Admitting: Internal Medicine

## 2022-03-14 ENCOUNTER — Other Ambulatory Visit: Payer: Self-pay | Admitting: Internal Medicine

## 2022-03-22 ENCOUNTER — Encounter: Payer: Self-pay | Admitting: Internal Medicine

## 2022-03-22 ENCOUNTER — Other Ambulatory Visit: Payer: Self-pay

## 2022-03-22 ENCOUNTER — Ambulatory Visit (INDEPENDENT_AMBULATORY_CARE_PROVIDER_SITE_OTHER): Payer: Medicaid Other | Admitting: Internal Medicine

## 2022-03-22 VITALS — BP 161/87 | HR 72 | Temp 98.0°F | Resp 16 | Ht 67.0 in | Wt 250.6 lb

## 2022-03-22 DIAGNOSIS — B20 Human immunodeficiency virus [HIV] disease: Secondary | ICD-10-CM

## 2022-03-22 DIAGNOSIS — Z79899 Other long term (current) drug therapy: Secondary | ICD-10-CM

## 2022-03-22 DIAGNOSIS — Z21 Asymptomatic human immunodeficiency virus [HIV] infection status: Secondary | ICD-10-CM

## 2022-03-22 DIAGNOSIS — Z23 Encounter for immunization: Secondary | ICD-10-CM | POA: Diagnosis not present

## 2022-03-22 DIAGNOSIS — Z5181 Encounter for therapeutic drug level monitoring: Secondary | ICD-10-CM | POA: Diagnosis not present

## 2022-03-22 DIAGNOSIS — Z113 Encounter for screening for infections with a predominantly sexual mode of transmission: Secondary | ICD-10-CM | POA: Diagnosis not present

## 2022-03-22 MED ORDER — BIKTARVY 50-200-25 MG PO TABS: 1.0000 | ORAL_TABLET | Freq: Every day | ORAL | 11 refills | Status: DC

## 2022-03-22 NOTE — Assessment & Plan Note (Signed)
Counseled on Prevnar 20 and declined.  She prefers to get her vaccines from her PCP ?

## 2022-03-22 NOTE — Assessment & Plan Note (Signed)
She is doing well, labs reassuring.  No issues and will continue on Biktarvy.  Refills sent. rtc 6 months.  ?

## 2022-03-22 NOTE — Progress Notes (Signed)
? ?  Subjective:  ? ? Patient ID: Latoya Roth, female    DOB: 1971-01-03, 51 y.o.   MRN: [id number] ? ?HPI ?Here for follow up of HIV ?She continues on [place] and denies any missed doses.  CD4 817, viral load not detected.  No issues with getting, taking or tolerating the medication.   ? ? ?Review of Systems  ?Constitutional:  Negative for fatigue.  ?Gastrointestinal:  Negative for diarrhea and nausea.  ?Skin:  Negative for rash.  ? ?   ?Objective:  ? Physical Exam ?Eyes:  ?   General: No scleral icterus. ?Pulmonary:  ?   Effort: Pulmonary effort is normal.  ?Skin: ?   Findings: No rash.  ?Neurological:  ?   Mental Status: She is alert.  ?Psychiatric:     ?   Mood and Affect: Mood normal.  ? ?SH: no current tobacco ? ? ? ?   ?Assessment & Plan:  ? ? ?

## 2022-03-27 ENCOUNTER — Other Ambulatory Visit (HOSPITAL_BASED_OUTPATIENT_CLINIC_OR_DEPARTMENT_OTHER): Payer: Self-pay | Admitting: Internal Medicine

## 2022-03-27 ENCOUNTER — Telehealth (HOSPITAL_BASED_OUTPATIENT_CLINIC_OR_DEPARTMENT_OTHER): Payer: Self-pay

## 2022-03-27 DIAGNOSIS — Z1231 Encounter for screening mammogram for malignant neoplasm of breast: Secondary | ICD-10-CM

## 2022-04-04 ENCOUNTER — Ambulatory Visit (HOSPITAL_BASED_OUTPATIENT_CLINIC_OR_DEPARTMENT_OTHER)
Admission: RE | Admit: 2022-04-04 | Discharge: 2022-04-04 | Disposition: A | Discharge status: Home / Self Care | Payer: Medicaid Other | Source: Ambulatory Visit | Attending: Internal Medicine | Admitting: Internal Medicine

## 2022-04-04 ENCOUNTER — Encounter (HOSPITAL_BASED_OUTPATIENT_CLINIC_OR_DEPARTMENT_OTHER): Payer: Self-pay

## 2022-04-04 DIAGNOSIS — Z1231 Encounter for screening mammogram for malignant neoplasm of breast: Secondary | ICD-10-CM | POA: Diagnosis present

## 2022-09-19 ENCOUNTER — Ambulatory Visit: Payer: Medicaid Other | Admitting: Internal Medicine

## 2022-09-20 ENCOUNTER — Encounter: Payer: Self-pay | Admitting: Internal Medicine

## 2022-09-20 ENCOUNTER — Other Ambulatory Visit (HOSPITAL_COMMUNITY)
Admission: RE | Admit: 2022-09-20 | Discharge: 2022-09-20 | Disposition: A | Discharge status: Home / Self Care | Payer: Medicaid Other | Source: Ambulatory Visit | Attending: Internal Medicine | Admitting: Internal Medicine

## 2022-09-20 ENCOUNTER — Other Ambulatory Visit: Payer: Self-pay

## 2022-09-20 ENCOUNTER — Ambulatory Visit (INDEPENDENT_AMBULATORY_CARE_PROVIDER_SITE_OTHER): Payer: Medicaid Other | Admitting: Internal Medicine

## 2022-09-20 VITALS — BP 125/81 | HR 75 | Resp 16 | Ht 67.0 in | Wt 251.0 lb

## 2022-09-20 DIAGNOSIS — B20 Human immunodeficiency virus [HIV] disease: Secondary | ICD-10-CM | POA: Diagnosis not present

## 2022-09-20 DIAGNOSIS — Z79899 Other long term (current) drug therapy: Secondary | ICD-10-CM

## 2022-09-20 DIAGNOSIS — Z113 Encounter for screening for infections with a predominantly sexual mode of transmission: Secondary | ICD-10-CM | POA: Diagnosis present

## 2022-09-20 MED ORDER — BIKTARVY 50-200-25 MG PO TABS: 1.0000 | ORAL_TABLET | Freq: Every day | ORAL | 11 refills | Status: DC

## 2022-09-20 NOTE — Assessment & Plan Note (Signed)
Will screen 

## 2022-09-20 NOTE — Assessment & Plan Note (Signed)
Will check her lipid panel.  Managed by her PCP

## 2022-09-20 NOTE — Progress Notes (Signed)
   Subjective:    Patient ID: Latoya Roth, female    DOB: 1971/01/18, 51 y.o.   MRN: [id number]  HPI Latoya Roth is here for follow up of HIV She continues on [place] and denies any missed doses.  No complaints today.  No issues with getting, taking or tolerating her medication.    Review of Systems  Constitutional:  Negative for fatigue.  Skin:  Negative for rash.       Objective:   Physical Exam Eyes:     General: No scleral icterus. Pulmonary:     Effort: Pulmonary effort is normal.  Neurological:     General: No focal deficit present.     Mental Status: She is alert.   SH: no tobacco        Assessment & Plan:

## 2022-09-20 NOTE — Assessment & Plan Note (Signed)
She continues to do well with no concerns.  No changes to her Biktarvy indicated.  Will check her labs today and she can otherwise rtc in 6 months.

## 2022-09-21 LAB — URINE CYTOLOGY ANCILLARY ONLY
Chlamydia: NEGATIVE
Comment: NEGATIVE
Comment: NORMAL
Neisseria Gonorrhea: NEGATIVE

## 2022-09-21 LAB — T-HELPER CELL (CD4) - (RCID CLINIC ONLY)
CD4 % Helper T Cell: 37 % (ref 33–65)
CD4 T Cell Abs: 587 /uL (ref 400–1790)

## 2022-09-25 LAB — CBC WITH DIFFERENTIAL/PLATELET
Absolute Monocytes: 353 cells/uL (ref 200–950)
Basophils Absolute: 49 cells/uL (ref 0–200)
Basophils Relative: 1 %
Eosinophils Absolute: 113 cells/uL (ref 15–500)
Eosinophils Relative: 2.3 %
HCT: 41.5 % (ref 35.0–45.0)
Hemoglobin: 13.5 g/dL (ref 11.7–15.5)
Lymphs Abs: 1749 cells/uL (ref 850–3900)
MCH: 26.3 pg — ABNORMAL LOW (ref 27.0–33.0)
MCHC: 32.5 g/dL (ref 32.0–36.0)
MCV: 80.7 fL (ref 80.0–100.0)
MPV: 9.6 fL (ref 7.5–12.5)
Monocytes Relative: 7.2 %
Neutro Abs: 2636 cells/uL (ref 1500–7800)
Neutrophils Relative %: 53.8 %
Platelets: 392 10*3/uL (ref 140–400)
RBC: 5.14 10*6/uL — ABNORMAL HIGH (ref 3.80–5.10)
RDW: 13.1 % (ref 11.0–15.0)
Total Lymphocyte: 35.7 %
WBC: 4.9 10*3/uL (ref 3.8–10.8)

## 2022-09-25 LAB — HIV-1 RNA QUANT-NO REFLEX-BLD
HIV 1 RNA Quant: <20 Copies/mL — ABNORMAL HIGH
HIV-1 RNA Quant, Log: <1.3 Log cps/mL — ABNORMAL HIGH

## 2022-09-25 LAB — LIPID PANEL
Cholesterol: 180 mg/dL (ref <200)
HDL: 43 mg/dL — ABNORMAL LOW (ref >=50)
LDL Cholesterol (Calc): 105 mg/dL (calc) — ABNORMAL HIGH
Non-HDL Cholesterol (Calc): 137 mg/dL (calc) — ABNORMAL HIGH (ref <130)
Total CHOL/HDL Ratio: 4.2 (calc) (ref <5.0)
Triglycerides: 196 mg/dL — ABNORMAL HIGH (ref <150)

## 2022-09-25 LAB — COMPLETE METABOLIC PANEL WITH GFR
AG Ratio: 1.2 (calc) (ref 1.0–2.5)
ALT: 9 U/L (ref 6–29)
AST: 10 U/L (ref 10–35)
Albumin: 4 g/dL (ref 3.6–5.1)
Alkaline phosphatase (APISO): 95 U/L (ref 37–153)
BUN/Creatinine Ratio: 9 (calc) (ref 6–22)
BUN: 6 mg/dL — ABNORMAL LOW (ref 7–25)
CO2: 31 mmol/L (ref 20–32)
Calcium: 9.2 mg/dL (ref 8.6–10.4)
Chloride: 96 mmol/L — ABNORMAL LOW (ref 98–110)
Creat: 0.64 mg/dL (ref 0.50–1.03)
Globulin: 3.4 g/dL (calc) (ref 1.9–3.7)
Glucose, Bld: 242 mg/dL — ABNORMAL HIGH (ref 65–99)
Potassium: 3.6 mmol/L (ref 3.5–5.3)
Sodium: 138 mmol/L (ref 135–146)
Total Bilirubin: 0.3 mg/dL (ref 0.2–1.2)
Total Protein: 7.4 g/dL (ref 6.1–8.1)
eGFR: 106 mL/min/{1.73_m2} (ref >=60)

## 2022-09-25 LAB — RPR: RPR Ser Ql: NONREACTIVE

## 2022-10-17 ENCOUNTER — Ambulatory Visit (INDEPENDENT_AMBULATORY_CARE_PROVIDER_SITE_OTHER): Payer: Medicaid Other | Admitting: Internal Medicine

## 2022-10-17 ENCOUNTER — Other Ambulatory Visit: Payer: Self-pay

## 2022-10-17 ENCOUNTER — Encounter: Payer: Self-pay | Admitting: Internal Medicine

## 2022-10-17 VITALS — BP 126/79 | HR 81 | Temp 97.8°F | Wt 250.0 lb

## 2022-10-17 DIAGNOSIS — B20 Human immunodeficiency virus [HIV] disease: Secondary | ICD-10-CM

## 2022-10-17 DIAGNOSIS — Z5181 Encounter for therapeutic drug level monitoring: Secondary | ICD-10-CM

## 2022-10-17 DIAGNOSIS — Z113 Encounter for screening for infections with a predominantly sexual mode of transmission: Secondary | ICD-10-CM | POA: Diagnosis not present

## 2022-10-17 NOTE — Assessment & Plan Note (Signed)
Creat, LFTs wnl.  

## 2022-10-17 NOTE — Progress Notes (Signed)
   Subjective:    Patient ID: Latoya Roth, female    DOB: [birth year], 51 y.o.   MRN: [id number]  HPI She is here for follow up of HIV She continues on Biktarvy with no missed doses.  I saw her one month ago and did labs then.  Here to review labs.  No new issues.     Review of Systems  Constitutional:  Negative for fatigue.  Gastrointestinal:  Negative for diarrhea.  Skin:  Negative for rash.       Objective:   Physical Exam Eyes:     General: No scleral icterus. Pulmonary:     Effort: Pulmonary effort is normal.  Neurological:     Mental Status: She is alert.           Assessment & Plan:

## 2022-10-17 NOTE — Assessment & Plan Note (Addendum)
Doing great, no changes indicated.  rtc in 6 months With DM, she likely needs a statin.  She will discuss the appropriateness of this with her PCP

## 2022-10-17 NOTE — Assessment & Plan Note (Signed)
Screened negative 

## 2023-03-21 ENCOUNTER — Encounter: Payer: Self-pay | Admitting: Internal Medicine

## 2023-03-21 ENCOUNTER — Ambulatory Visit (INDEPENDENT_AMBULATORY_CARE_PROVIDER_SITE_OTHER): Payer: Medicaid Other | Admitting: Internal Medicine

## 2023-03-21 ENCOUNTER — Other Ambulatory Visit: Payer: Self-pay

## 2023-03-21 VITALS — BP 157/83 | HR 110 | Temp 97.6°F | Ht 67.0 in | Wt 255.0 lb

## 2023-03-21 DIAGNOSIS — Z113 Encounter for screening for infections with a predominantly sexual mode of transmission: Secondary | ICD-10-CM

## 2023-03-21 DIAGNOSIS — B20 Human immunodeficiency virus [HIV] disease: Secondary | ICD-10-CM | POA: Diagnosis present

## 2023-03-21 DIAGNOSIS — Z79899 Other long term (current) drug therapy: Secondary | ICD-10-CM | POA: Diagnosis not present

## 2023-03-21 DIAGNOSIS — Z5181 Encounter for therapeutic drug level monitoring: Secondary | ICD-10-CM | POA: Diagnosis not present

## 2023-03-21 NOTE — Assessment & Plan Note (Signed)
Creat, LFTs wnl.  

## 2023-03-21 NOTE — Assessment & Plan Note (Signed)
Previous lipid panel reviewed with her.  Has DM so statin indicated and will defer to her PCP.

## 2023-03-21 NOTE — Assessment & Plan Note (Signed)
She continues to do well, no concerns.  Labs today and rtc in 6 months.

## 2023-03-21 NOTE — Progress Notes (Signed)
   Subjective:    Patient ID: Latoya Roth, female    DOB: December 21, 1971, 52 y.o.   MRN: [id number]  HPI Latoya Roth is her for follow up of HIV She continues on [place] with no missed doses.  No new concerns.  No issues with getting or taking her medication.    Review of Systems  Constitutional:  Negative for fatigue.  Gastrointestinal:  Negative for diarrhea.  Skin:  Negative for rash.       Objective:   Physical Exam Eyes:     General: No scleral icterus. Pulmonary:     Effort: Pulmonary effort is normal.  Neurological:     Mental Status: She is alert.   SH: no tobacco        Assessment & Plan:

## 2023-03-21 NOTE — Assessment & Plan Note (Signed)
Screened negative 

## 2023-03-23 LAB — T-HELPER CELLS (CD4) COUNT (NOT AT ARMC)
Absolute CD4: 848 cells/uL (ref 490–1740)
CD4 T Helper %: 36 % (ref 30–61)
Total lymphocyte count: 2342 cells/uL (ref 850–3900)

## 2023-03-23 LAB — HIV-1 RNA QUANT-NO REFLEX-BLD
HIV 1 RNA Quant: <20 Copies/mL — ABNORMAL HIGH
HIV-1 RNA Quant, Log: <1.3 Log cps/mL — ABNORMAL HIGH

## 2023-04-02 ENCOUNTER — Other Ambulatory Visit: Payer: Self-pay | Admitting: Internal Medicine

## 2023-04-11 ENCOUNTER — Emergency Department (HOSPITAL_COMMUNITY): Payer: Medicaid Other

## 2023-04-11 ENCOUNTER — Encounter (HOSPITAL_COMMUNITY): Payer: Self-pay | Admitting: Emergency Medicine

## 2023-04-11 ENCOUNTER — Inpatient Hospital Stay (HOSPITAL_COMMUNITY)
Admission: EM | Admit: 2023-04-11 | Discharge: 2023-04-16 | DRG: 065 | Disposition: A | Discharge status: Rehabilitation Facility | Payer: Medicaid Other | Attending: Internal Medicine | Admitting: Internal Medicine

## 2023-04-11 ENCOUNTER — Other Ambulatory Visit: Payer: Self-pay

## 2023-04-11 DIAGNOSIS — F419 Anxiety disorder, unspecified: Secondary | ICD-10-CM | POA: Diagnosis present

## 2023-04-11 DIAGNOSIS — Z7984 Long term (current) use of oral hypoglycemic drugs: Secondary | ICD-10-CM

## 2023-04-11 DIAGNOSIS — I10 Essential (primary) hypertension: Secondary | ICD-10-CM | POA: Diagnosis present

## 2023-04-11 DIAGNOSIS — F32A Depression, unspecified: Secondary | ICD-10-CM | POA: Diagnosis not present

## 2023-04-11 DIAGNOSIS — Z9049 Acquired absence of other specified parts of digestive tract: Secondary | ICD-10-CM

## 2023-04-11 DIAGNOSIS — I639 Cerebral infarction, unspecified: Principal | ICD-10-CM | POA: Diagnosis present

## 2023-04-11 DIAGNOSIS — Z7951 Long term (current) use of inhaled steroids: Secondary | ICD-10-CM

## 2023-04-11 DIAGNOSIS — J4540 Moderate persistent asthma, uncomplicated: Secondary | ICD-10-CM | POA: Insufficient documentation

## 2023-04-11 DIAGNOSIS — Z87891 Personal history of nicotine dependence: Secondary | ICD-10-CM

## 2023-04-11 DIAGNOSIS — E669 Obesity, unspecified: Secondary | ICD-10-CM | POA: Diagnosis present

## 2023-04-11 DIAGNOSIS — Z79899 Other long term (current) drug therapy: Secondary | ICD-10-CM

## 2023-04-11 DIAGNOSIS — E1165 Type 2 diabetes mellitus with hyperglycemia: Secondary | ICD-10-CM | POA: Diagnosis present

## 2023-04-11 DIAGNOSIS — Z881 Allergy status to other antibiotic agents status: Secondary | ICD-10-CM

## 2023-04-11 DIAGNOSIS — J309 Allergic rhinitis, unspecified: Secondary | ICD-10-CM | POA: Diagnosis present

## 2023-04-11 DIAGNOSIS — B20 Human immunodeficiency virus [HIV] disease: Secondary | ICD-10-CM | POA: Diagnosis present

## 2023-04-11 DIAGNOSIS — I6381 Other cerebral infarction due to occlusion or stenosis of small artery: Principal | ICD-10-CM | POA: Diagnosis present

## 2023-04-11 DIAGNOSIS — R29810 Facial weakness: Secondary | ICD-10-CM | POA: Diagnosis present

## 2023-04-11 DIAGNOSIS — G8191 Hemiplegia, unspecified affecting right dominant side: Secondary | ICD-10-CM | POA: Diagnosis present

## 2023-04-11 DIAGNOSIS — W19XXXA Unspecified fall, initial encounter: Secondary | ICD-10-CM | POA: Diagnosis present

## 2023-04-11 DIAGNOSIS — Z6839 Body mass index (BMI) 39.0-39.9, adult: Secondary | ICD-10-CM

## 2023-04-11 DIAGNOSIS — Z713 Dietary counseling and surveillance: Secondary | ICD-10-CM

## 2023-04-11 DIAGNOSIS — Z91013 Allergy to seafood: Secondary | ICD-10-CM

## 2023-04-11 DIAGNOSIS — Z880 Allergy status to penicillin: Secondary | ICD-10-CM

## 2023-04-11 DIAGNOSIS — R471 Dysarthria and anarthria: Secondary | ICD-10-CM | POA: Diagnosis present

## 2023-04-11 DIAGNOSIS — J301 Allergic rhinitis due to pollen: Secondary | ICD-10-CM

## 2023-04-11 DIAGNOSIS — Z8249 Family history of ischemic heart disease and other diseases of the circulatory system: Secondary | ICD-10-CM

## 2023-04-11 DIAGNOSIS — E785 Hyperlipidemia, unspecified: Secondary | ICD-10-CM | POA: Diagnosis present

## 2023-04-11 DIAGNOSIS — Z794 Long term (current) use of insulin: Secondary | ICD-10-CM

## 2023-04-11 DIAGNOSIS — R29708 NIHSS score 8: Secondary | ICD-10-CM | POA: Diagnosis present

## 2023-04-11 DIAGNOSIS — E871 Hypo-osmolality and hyponatremia: Secondary | ICD-10-CM | POA: Diagnosis present

## 2023-04-11 DIAGNOSIS — Z21 Asymptomatic human immunodeficiency virus [HIV] infection status: Secondary | ICD-10-CM | POA: Diagnosis present

## 2023-04-11 DIAGNOSIS — Z7985 Long-term (current) use of injectable non-insulin antidiabetic drugs: Secondary | ICD-10-CM

## 2023-04-11 DIAGNOSIS — E119 Type 2 diabetes mellitus without complications: Secondary | ICD-10-CM | POA: Diagnosis not present

## 2023-04-11 DIAGNOSIS — M1990 Unspecified osteoarthritis, unspecified site: Secondary | ICD-10-CM | POA: Diagnosis present

## 2023-04-11 LAB — I-STAT CHEM 8, ED
BUN: 11 mg/dL (ref 6–20)
Calcium, Ion: 1.17 mmol/L (ref 1.15–1.40)
Chloride: 97 mmol/L — ABNORMAL LOW (ref 98–111)
Creatinine, Ser: 0.7 mg/dL (ref 0.44–1.00)
Glucose, Bld: 202 mg/dL — ABNORMAL HIGH (ref 70–99)
HCT: 45 % (ref 36.0–46.0)
Hemoglobin: 15.3 g/dL — ABNORMAL HIGH (ref 12.0–15.0)
Potassium: 3.7 mmol/L (ref 3.5–5.1)
Sodium: 135 mmol/L (ref 135–145)
TCO2: 28 mmol/L (ref 22–32)

## 2023-04-11 LAB — CBC
HCT: 42.2 % (ref 36.0–46.0)
Hemoglobin: 13.9 g/dL (ref 12.0–15.0)
MCH: 26.6 pg (ref 26.0–34.0)
MCHC: 32.9 g/dL (ref 30.0–36.0)
MCV: 80.8 fL (ref 80.0–100.0)
Platelets: 414 10*3/uL — ABNORMAL HIGH (ref 150–400)
RBC: 5.22 MIL/uL — ABNORMAL HIGH (ref 3.87–5.11)
RDW: 13.5 % (ref 11.5–15.5)
WBC: 7.7 10*3/uL (ref 4.0–10.5)
nRBC: 0 % (ref 0.0–0.2)

## 2023-04-11 LAB — COMPREHENSIVE METABOLIC PANEL
ALT: 16 U/L (ref 0–44)
AST: 17 U/L (ref 15–41)
Albumin: 4.1 g/dL (ref 3.5–5.0)
Alkaline Phosphatase: 89 U/L (ref 38–126)
Anion gap: 13 (ref 5–15)
BUN: 10 mg/dL (ref 6–20)
CO2: 27 mmol/L (ref 22–32)
Calcium: 9.5 mg/dL (ref 8.9–10.3)
Chloride: 95 mmol/L — ABNORMAL LOW (ref 98–111)
Creatinine, Ser: 0.81 mg/dL (ref 0.44–1.00)
GFR, Estimated: >60 mL/min (ref >60)
Glucose, Bld: 197 mg/dL — ABNORMAL HIGH (ref 70–99)
Potassium: 3.6 mmol/L (ref 3.5–5.1)
Sodium: 135 mmol/L (ref 135–145)
Total Bilirubin: 0.5 mg/dL (ref 0.3–1.2)
Total Protein: 8 g/dL (ref 6.5–8.1)

## 2023-04-11 LAB — URINALYSIS, ROUTINE W REFLEX MICROSCOPIC
Bacteria, UA: NONE SEEN
Bilirubin Urine: NEGATIVE
Glucose, UA: NEGATIVE mg/dL
Hgb urine dipstick: NEGATIVE
Ketones, ur: NEGATIVE mg/dL
Leukocytes,Ua: NEGATIVE
Nitrite: NEGATIVE
Protein, ur: 30 mg/dL — AB
Specific Gravity, Urine: 1.014 (ref 1.005–1.030)
pH: 5 (ref 5.0–8.0)

## 2023-04-11 LAB — DIFFERENTIAL
Abs Immature Granulocytes: 0.03 10*3/uL (ref 0.00–0.07)
Basophils Absolute: 0 10*3/uL (ref 0.0–0.1)
Basophils Relative: 0 %
Eosinophils Absolute: 0.1 10*3/uL (ref 0.0–0.5)
Eosinophils Relative: 2 %
Immature Granulocytes: 0 %
Lymphocytes Relative: 38 %
Lymphs Abs: 2.9 10*3/uL (ref 0.7–4.0)
Monocytes Absolute: 0.6 10*3/uL (ref 0.1–1.0)
Monocytes Relative: 8 %
Neutro Abs: 4 10*3/uL (ref 1.7–7.7)
Neutrophils Relative %: 52 %

## 2023-04-11 LAB — RAPID URINE DRUG SCREEN, HOSP PERFORMED
Amphetamines: NOT DETECTED
Barbiturates: NOT DETECTED
Benzodiazepines: NOT DETECTED
Cocaine: NOT DETECTED
Opiates: NOT DETECTED
Tetrahydrocannabinol: NOT DETECTED

## 2023-04-11 LAB — APTT: aPTT: 25 seconds (ref 24–36)

## 2023-04-11 LAB — I-STAT BETA HCG BLOOD, ED (MC, WL, AP ONLY): I-stat hCG, quantitative: <5 m[IU]/mL (ref <5)

## 2023-04-11 LAB — ETHANOL: Alcohol, Ethyl (B): <10 mg/dL (ref <10)

## 2023-04-11 LAB — PROTIME-INR
INR: 1 (ref 0.8–1.2)
Prothrombin Time: 12.9 seconds (ref 11.4–15.2)

## 2023-04-11 LAB — CBG MONITORING, ED: Glucose-Capillary: 195 mg/dL — ABNORMAL HIGH (ref 70–99)

## 2023-04-11 MED ORDER — ACETAMINOPHEN 650 MG RE SUPP: 650.0000 mg | Freq: Four times a day (QID) | RECTAL | Status: DC | PRN

## 2023-04-11 MED ORDER — STROKE: EARLY STAGES OF RECOVERY BOOK
Freq: Once | Status: AC
  Filled 2023-04-11: qty 1

## 2023-04-11 MED ORDER — MIDAZOLAM HCL 2 MG/2ML IJ SOLN
2.0000 mg | Freq: Once | INTRAMUSCULAR | Status: AC | PRN
  Administered 2023-04-11: 2 mg via INTRAVENOUS
  Filled 2023-04-11: qty 2

## 2023-04-11 MED ORDER — ACETAMINOPHEN 325 MG PO TABS
650.0000 mg | ORAL_TABLET | Freq: Four times a day (QID) | ORAL | Status: DC | PRN
  Administered 2023-04-12 – 2023-04-15 (×6): 650 mg via ORAL
  Filled 2023-04-11 (×6): qty 2

## 2023-04-11 MED ORDER — INSULIN ASPART 100 UNIT/ML IJ SOLN
0.0000 [IU] | Freq: Three times a day (TID) | INTRAMUSCULAR | Status: DC
  Administered 2023-04-12: 5 [IU] via SUBCUTANEOUS
  Administered 2023-04-12: 3 [IU] via SUBCUTANEOUS
  Administered 2023-04-12 – 2023-04-13 (×2): 9 [IU] via SUBCUTANEOUS
  Administered 2023-04-13: 5 [IU] via SUBCUTANEOUS
  Administered 2023-04-13 – 2023-04-14 (×3): 7 [IU] via SUBCUTANEOUS
  Administered 2023-04-14: 5 [IU] via SUBCUTANEOUS

## 2023-04-11 MED ORDER — MELATONIN 3 MG PO TABS
3.0000 mg | ORAL_TABLET | Freq: Every evening | ORAL | Status: DC | PRN
  Administered 2023-04-12 – 2023-04-15 (×4): 3 mg via ORAL
  Filled 2023-04-11 (×4): qty 1

## 2023-04-11 MED ORDER — ONDANSETRON HCL 4 MG/2ML IJ SOLN: 4.0000 mg | Freq: Four times a day (QID) | INTRAMUSCULAR | Status: DC | PRN

## 2023-04-11 NOTE — ED Notes (Signed)
Patient transported to MRI 

## 2023-04-11 NOTE — ED Provider Notes (Signed)
Latoya Roth EMERGENCY DEPARTMENT AT [facility] Provider Note   CSN: [id number] Arrival date & time: 04/11/23  1513     History  Chief Complaint  Patient presents with   Weakness    Latoya Roth is a 52 y.o. female.  Latoya Roth is a 52 y.o. female with a history of hypertension, HIV, diabetes, Bell's palsy, asthma, anxiety and obesity, who presents to the emergency department for evaluation of right-sided weakness, facial droop and dysarthria.  Symptoms started 2 days ago.  Patient reports she noticed right-sided facial droop on Tuesday with associated right-sided weakness.  Had a fall last night due to right leg weakness but denies injury or pain from fall.  Had another fall this morning.  She is not on blood thinners.  She denies history of similar weakness or speech changes.  Patient is accompanied by her sister who helps to provide history.  States that she went to [facility] 2 days ago but was discharged home after reported normal head CT.  The history is provided by the patient and a relative.  Weakness      Home Medications Prior to Admission medications   Medication Sig Start Date End Date Taking? Authorizing Provider  albuterol (PROVENTIL HFA;VENTOLIN HFA) 108 (90 BASE) MCG/ACT inhaler Inhale 2 puffs into the lungs every 6 (six) hours as needed for wheezing or shortness of breath.    [provider]  albuterol-ipratropium (COMBIVENT) 18-103 MCG/ACT inhaler Inhale 2 puffs into the lungs every 6 (six) hours as needed for shortness of breath. Reported on 04/27/2016    [provider]  bictegravir-emtricitabine-tenofovir AF (BIKTARVY) 50-200-25 MG TABS tablet Take 1 tablet by mouth daily. 09/20/22   Gardiner Barefoot, MD  budesonide-formoterol (SYMBICORT) 160-4.5 MCG/ACT inhaler Inhale 2 puffs into the lungs 2 (two) times daily.    [provider]  cetirizine (ZYRTEC) 10 MG tablet Take 10 mg by mouth daily.    [provider]  escitalopram (LEXAPRO) 20 MG tablet Take by mouth.    [provider]  furosemide (LASIX) 40 MG tablet Take 40 mg by mouth daily. 02/22/22   [provider]  glipiZIDE (GLUCOTROL XL) 10 MG 24 hr tablet Take 10 mg by mouth daily. 02/22/22   [provider]  hydrOXYzine (ATARAX/VISTARIL) 25 MG tablet Take 1 tablet (25 mg total) by mouth 3 (three) times daily as needed for itching. 05/19/18   Fayrene Helper, PA-C  LANTUS SOLOSTAR 100 UNIT/ML Solostar Pen  02/22/22   [provider]  lisinopril (PRINIVIL,ZESTRIL) 10 MG tablet Take 10 mg by mouth daily.    [provider]  meloxicam (MOBIC) 15 MG tablet Take 15 mg by mouth daily. 03/18/23   [provider]  metFORMIN (GLUCOPHAGE) 500 MG tablet Take 500 mg by mouth 2 (two) times daily with a meal.    [provider]  OZEMPIC, 0.25 OR 0.5 MG/DOSE, 2 MG/3ML SOPN Inject into the skin. 02/19/23   [provider]  tiZANidine (ZANAFLEX) 4 MG tablet  02/22/22   [provider]      Allergies    Penicillins, Shellfish allergy, and Amoxicillin    Review of Systems   Review of Systems  Neurological:  Positive for facial asymmetry, speech difficulty and weakness.    Physical Exam Updated Vital Signs BP (!) 153/82 (BP Location: Left Arm)   Pulse 75   Temp 98.4 F (36.9 C)   Resp 18   Ht 5\' 7"  (1.702  m)   Wt 113.4 kg   SpO2 98%   BMI 39.16 kg/m  Physical Exam Vitals and nursing note reviewed.  Constitutional:      General: She is not in acute distress.    Appearance: Normal appearance. She is well-developed. She is not diaphoretic.  HENT:     Head: Normocephalic and atraumatic.  Eyes:     General:        Right eye: No discharge.        Left eye: No discharge.     Extraocular Movements: Extraocular movements intact.     Pupils: Pupils are equal, round, and reactive to light.  Cardiovascular:     Rate and Rhythm: Normal rate and regular rhythm.     Pulses:  Normal pulses.     Heart sounds: Normal heart sounds.  Pulmonary:     Effort: Pulmonary effort is normal. No respiratory distress.     Breath sounds: Normal breath sounds. No wheezing or rales.     Comments: Respirations equal and unlabored, patient able to speak in full sentences, lungs clear to auscultation bilaterally  Abdominal:     General: Bowel sounds are normal. There is no distension.     Palpations: Abdomen is soft. There is no mass.     Tenderness: There is no abdominal tenderness. There is no guarding.     Comments: Abdomen soft, nondistended, nontender to palpation in all quadrants without guarding or peritoneal signs  Musculoskeletal:        General: No deformity.     Cervical back: Neck supple.  Skin:    General: Skin is warm and dry.     Capillary Refill: Capillary refill takes less than 2 seconds.  Neurological:     Mental Status: She is alert.     Motor: Weakness present.     Coordination: Coordination normal.     Comments: Alert and oriented, mild dysarthria noted. Right-sided facial droop that patient is able to overcome with smile, no other cranial nerve deficits noted, tongue is midline, PERRLA, EOMI Patient with significant weakness of the right upper and lower extremity, normal strength in the left upper and lower extremity. Sensation intact in all 4 extremities  Psychiatric:        Mood and Affect: Mood normal.        Behavior: Behavior normal.     ED Results / Procedures / Treatments   Labs (all labs ordered are listed, but only abnormal results are displayed) Labs Reviewed  CBC - Abnormal; Notable for the following components:      Result Value   RBC 5.22 (*)    Platelets 414 (*)    All other components within normal limits  COMPREHENSIVE METABOLIC PANEL - Abnormal; Notable for the following components:   Chloride 95 (*)    Glucose, Bld 197 (*)    All other components within normal limits  I-STAT CHEM 8, ED - Abnormal; Notable for the following  components:   Chloride 97 (*)    Glucose, Bld 202 (*)    Hemoglobin 15.3 (*)    All other components within normal limits  CBG MONITORING, ED - Abnormal; Notable for the following components:   Glucose-Capillary 195 (*)    All other components within normal limits  ETHANOL  PROTIME-INR  APTT  DIFFERENTIAL  RAPID URINE DRUG SCREEN, HOSP PERFORMED  URINALYSIS, ROUTINE W REFLEX MICROSCOPIC  I-STAT BETA HCG BLOOD, ED ([facility], [facility], [facility] ONLY)    EKG EKG Interpretation  Date/Time:  Thursday April 11 2023 15:54:29 EDT Ventricular Rate:  74 PR Interval:  138 QRS Duration: 88 QT Interval:  408 QTC Calculation: 452 R Axis:   -6 Text Interpretation:  Poor data quality, interpretation may be adversely affected Sinus rhythm with occasional Premature ventricular complexes Otherwise normal ECG No significant change since last tracing Confirmed by Elayne Snare ([id number]) on 04/11/2023 7:28:06 PM  Radiology CT HEAD WO CONTRAST  Result Date: 04/11/2023 CLINICAL DATA:  Right leg and arm weakness, slurred speech, facial droop EXAM: CT HEAD WITHOUT CONTRAST TECHNIQUE: Contiguous axial images were obtained from the base of the skull through the vertex without intravenous contrast. RADIATION DOSE REDUCTION: This exam was performed according to the departmental dose-optimization program which includes automated exposure control, adjustment of the mA and/or kV according to patient size and/or use of iterative reconstruction technique. COMPARISON:  04/09/2023 CT head FINDINGS: Brain: Increased hypodensity in the left pons (series 3, image 11-12), which in retrospect was quite subtle but likely present on the 04/09/2023 CT. No acute hemorrhage, mass, mass effect, or midline shift. No hydrocephalus or extra-axial collection. Vascular: No hyperdense vessel. Skull: Negative for fracture or focal lesion. Sinuses/Orbits: No acute finding. Status post bilateral lens replacements. Other: The mastoid air cells are well  aerated. IMPRESSION: 1. Increased hypodensity in the left pons, favored to represent an acute or early subacute infarct. An MRI head is recommended for further evaluation. 2. No acute hemorrhage. These results were called by telephone at the time of interpretation on 04/11/2023 at 5:41 pm to provider Brandon Scarbrough, who verbally acknowledged these results. Electronically Signed   By: Wiliam Ke M.D.   On: 04/11/2023 17:44    Procedures Procedures    Medications Ordered in ED Medications  midazolam (VERSED) injection 2 mg (2 mg Intravenous Given 04/11/23 1921)   stroke: early stages of recovery book ( Does not apply Given 04/12/23 1145)  iohexol (OMNIPAQUE) 350 MG/ML injection 75 mL (75 mLs Intravenous Contrast Given 04/12/23 1005)    ED Course/ Medical Decision Making/ A&P                           Medical Decision Making Amount and/or Complexity of Data Reviewed Radiology: ordered.  Risk Prescription drug management. Decision regarding hospitalization.   52 y.o. female presents to the ED with complaints of right sided weakness and facial droop, this involves an extensive number of treatment options, and is a complaint that carries with it a high risk of complications and morbidity.  The differential diagnosis includes ischemic stroke, intracranial hemorrhage, intracranial mass, metabolic derangement, hyperglycemia, hypertensive emergency  On arrival pt is nontoxic, vitals significant for mildly elevated blood press, vitals otherwise stable.   Additional history obtained from sister at bedside. Previous records obtained and reviewed including recent ED visit at [facility] for shortness of breath   Lab Tests:  I Ordered, reviewed, and interpreted labs, which included: No leukocytosis and normal hemoglobin, mild hyponatremia with sodium of 131, glucose 271, no other significant electrolyte derangements and normal renal function, negative pregnancy, ethanol level negative, coags within  normal limits.  UA without signs of infection.  Imaging Studies ordered:  I ordered imaging studies which included CT head without contrast and MRI brain, I independently visualized and interpreted imaging which showed increased hypodensity in the left pons concerning for acute or early subacute infarct.  MRI brain is pending at shift change  ED Course:   52 year old female presents with right-sided  weakness, facial droop and slurred speech, symptoms began 2 days ago.   I discussed CT results with patient and family member at bedside with high concern for stroke.  Discussed recommendation for MRI and likely need for hospital admission.  Patient is initially very upset and states that she does not want an MRI.  I discussed this concern at length with patient.  She has never previously had an MRI but is concerned about going into the tube.  Patient states that this year is based on seeing the movie [place].  I explained the process for MRI in detail with patient and offered medication to ease anxiety and she is now stable with MRI.  At shift change MRI is pending, care signed out to Dr. Theresia Lo who will follow-up on pending MRI results, anticipate neurology consult and hospital admission.  Portions of this note were generated with Scientist, clinical (histocompatibility and immunogenetics). Dictation errors may occur despite best attempts at proofreading.         Final Clinical Impression(s) / ED Diagnoses Final diagnoses:  Cerebrovascular accident (CVA), unspecified mechanism    Rx / DC Orders ED Discharge Orders     None         Dartha Lodge, [place] 04/18/23 0944    Rexford Maus, DO 04/18/23 1600

## 2023-04-11 NOTE — ED Triage Notes (Addendum)
Pt c/o right leg weakness, slurred speech, and facial droop that started two days ago. Pt states that right arm weakness started this morning at approx 0630. Endorses fall x2 yesterday and this morning. Denies blood thinners.

## 2023-04-11 NOTE — Consult Note (Signed)
Neurology Consultation  Reason for Consult: Stroke Referring Physician: Dr. Elayne Snare  CC: Right-sided weakness, right facial droop, dysarthria  History is obtained from: Patient, chart  HPI: Latoya Roth is a 52 y.o. female prior medical history of obesity, HIV, diabetes, prior Bell's palsy, former smoker who presents for evaluation of right-sided weakness and right-sided facial droop that started 4 days ago. She reports that she had been to an outside hospital where they evaluated her and discharged her after working her up for wheezing although she had been complaining of headache as well as slurred speech for the prior 2 days from presentation on 04/09/2023. She was seen in this ER today, head CT obtained with strong suspicion for a pontine infarct which was followed by an MRI that confirmed a subacute left paramedian pons infarct Neurology was consulted for stroke workup   LKW: 4 days ago IV thrombolysis given?: no, outside the window EVT: No-outside the window Premorbid modified Rankin scale (mRS): 1   ROS: Full ROS was performed and is negative except as noted in the HPI.   Past Medical History:  Diagnosis Date   Anxiety    Arthritis    Asthma    Bell's palsy    BV (bacterial vaginosis)    Depression    Diabetes mellitus    HIV disease    Hypertension    Obesity     Family History  Problem Relation Age of Onset   Hypertension Mother    Cancer Son     Social History:   reports that she has quit smoking. She has never used smokeless tobacco. She reports current alcohol use of about 14.0 standard drinks of alcohol per week. She reports that she does not use drugs.  Medications No current facility-administered medications for this encounter.  Current Outpatient Medications:    albuterol (PROVENTIL HFA;VENTOLIN HFA) 108 (90 BASE) MCG/ACT inhaler, Inhale 2 puffs into the lungs every 6 (six) hours as needed for wheezing or shortness of breath., Disp: ,  Rfl:    albuterol-ipratropium (COMBIVENT) 18-103 MCG/ACT inhaler, Inhale 2 puffs into the lungs every 6 (six) hours as needed for shortness of breath. Reported on 04/27/2016, Disp: , Rfl:    bictegravir-emtricitabine-tenofovir AF (BIKTARVY) 50-200-25 MG TABS tablet, Take 1 tablet by mouth daily., Disp: 30 tablet, Rfl: 11   budesonide-formoterol (SYMBICORT) 160-4.5 MCG/ACT inhaler, Inhale 2 puffs into the lungs 2 (two) times daily., Disp: , Rfl:    cetirizine (ZYRTEC) 10 MG tablet, Take 10 mg by mouth daily., Disp: , Rfl:    escitalopram (LEXAPRO) 20 MG tablet, Take by mouth., Disp: , Rfl:    furosemide (LASIX) 40 MG tablet, Take 40 mg by mouth daily., Disp: , Rfl:    glipiZIDE (GLUCOTROL XL) 10 MG 24 hr tablet, Take 10 mg by mouth daily., Disp: , Rfl:    hydrOXYzine (ATARAX/VISTARIL) 25 MG tablet, Take 1 tablet (25 mg total) by mouth 3 (three) times daily as needed for itching., Disp: 30 tablet, Rfl: 0   LANTUS SOLOSTAR 100 UNIT/ML Solostar Pen, , Disp: , Rfl:    lisinopril (PRINIVIL,ZESTRIL) 10 MG tablet, Take 10 mg by mouth daily., Disp: , Rfl:    meloxicam (MOBIC) 15 MG tablet, Take 15 mg by mouth daily., Disp: , Rfl:    metFORMIN (GLUCOPHAGE) 500 MG tablet, Take 500 mg by mouth 2 (two) times daily with a meal., Disp: , Rfl:    OZEMPIC, 0.25 OR 0.5 MG/DOSE, 2 MG/3ML SOPN, Inject into the skin., Disp: ,  Rfl:    tiZANidine (ZANAFLEX) 4 MG tablet, , Disp: , Rfl:   Exam: Current vital signs: BP (!) 148/74   Pulse 73   Temp 98 F (36.7 C)   Resp (!) 23   Ht  (1.702 m)   Wt 113.4 kg   SpO2 100%   BMI 39.16 kg/m  Vital signs in last 24 hours: Temp:  [98 F (36.7 C)-98.4 F (36.9 C)] 98 F (36.7 C) (04/18 2015) Pulse Rate:  [73-81] 73 (04/18 1745) Resp:  [18-23] 23 (04/18 1730) BP: (148-171)/(74-97) 148/74 (04/18 1745) SpO2:  [98 %-100 %] 100 % (04/18 1745) Weight:  [113.4 kg] 113.4 kg (04/18 1558) General: Awake alert in no distress HEENT: Normocephalic atraumatic Lungs:  Clear Cardiovascular: Regular rhythm Abdomen nontender, obese Neurological examination Awake alert oriented x 3 Mild dysarthria No aphasia Cranial nerves: Pupils equal round react light, extraocular movements intact, visual fields full, at rest, there is right lower facial droop which she is able to overcome when smiling.  Tongue and palate midline. Motor examination reveals significant weakness in the right upper and lower extremity.  There is also some weakness in the left lower extremity.  Left upper extremity is full strength. Sensation is diminished on the right in comparison to the left Coordination: Difficult to perform testing on the right due to weakness  NIHSS 1a Level of Conscious.: 0 1b LOC Questions: 0 1c LOC Commands: 0 2 Best Gaze: 0 3 Visual: 0 4 Facial Palsy: 1 5a Motor Arm - left: 1 5b Motor Arm - Right: 0 6a Motor Leg - Left: 2 6b Motor Leg - Right: 1 7 Limb Ataxia: 0 8 Sensory: 2 9 Best Language: 0 10 Dysarthria: 1 11 Extinct. and Inatten.: 0 TOTAL: 8   Labs I have reviewed labs in epic and the results pertinent to this consultation are: CBC    Component Value Date/Time   WBC 7.7 04/11/2023 1606   RBC 5.22 (H) 04/11/2023 1606   HGB 15.3 (H) 04/11/2023 1618   HCT 45.0 04/11/2023 1618   PLT 414 (H) 04/11/2023 1606   MCV 80.8 04/11/2023 1606   MCH 26.6 04/11/2023 1606   MCHC 32.9 04/11/2023 1606   RDW 13.5 04/11/2023 1606   LYMPHSABS 2.9 04/11/2023 1606   MONOABS 0.6 04/11/2023 1606   EOSABS 0.1 04/11/2023 1606   BASOSABS 0.0 04/11/2023 1606    CMP     Component Value Date/Time   NA 135 04/11/2023 1618   K 3.7 04/11/2023 1618   CL 97 (L) 04/11/2023 1618   CO2 27 04/11/2023 1606   GLUCOSE 202 (H) 04/11/2023 1618   BUN 11 04/11/2023 1618   CREATININE 0.70 04/11/2023 1618   CREATININE 0.64 09/20/2022 1058   CALCIUM 9.5 04/11/2023 1606   PROT 8.0 04/11/2023 1606   ALBUMIN 4.1 04/11/2023 1606   AST 17 04/11/2023 1606   ALT 16 04/11/2023  1606   ALKPHOS 89 04/11/2023 1606   BILITOT 0.5 04/11/2023 1606   GFRNONAA >60 04/11/2023 1606   GFRNONAA 89 06/22/2019 1354   GFRAA 103 06/22/2019 1354    Lipid Panel     Component Value Date/Time   CHOL 180 09/20/2022 1058   TRIG 196 (H) 09/20/2022 1058   HDL 43 (L) 09/20/2022 1058   CHOLHDL 4.2 09/20/2022 1058   VLDL 30 03/13/2017 1700   LDLCALC 105 (H) 09/20/2022 1058     Imaging I have reviewed the images obtained:  [facility]: Hypodensity in the  left paramedian pons CT head reviewed in PACS from 04/09/2023 and in hindsight that hypodensity had started to evolve on 04/09/2023 itself. MRI brain without contrast done today [facility] confirms subacute left paramedian pontine infarct without any hemorrhage or mass effect.  Assessment: 52 year old woman with above past medical history with 4 days right facial droop, dysarthria and right-sided weakness, with subacute left paramedian pontine infarction-likely small vessel etiology given her risk factors. 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 2D echo A1c Lipid panel Aspirin 81 and Plavix 75-for 3 weeks followed by aspirin only PT OT Speech therapy N.p.o. until cleared by bedside swallow evaluation No need for permissive hypertension-symptoms at least 71 days old.  Goal blood pressure is normotension Stroke team to follow Plan discussed with the patient, spouse and sister at bedside. Plan also discussed with Dr. Theresia Lo in the ER. -- Milon Dikes, MD Neurologist [facility] Pager: [phone]

## 2023-04-11 NOTE — ED Provider Triage Note (Signed)
Emergency Medicine Provider Triage Evaluation Note  Latoya Roth , a 52 y.o. female  was evaluated in triage.  Pt complains of weakness and facial droop.  Stated she noticed some numbness and weakness in her right leg started 2 days ago along with facial droop and slurred speech.  She fell last night due to the weakness in her leg.  Also stated that she is now having weakness numbness in her right arm that started around 6:30 AM this morning.  States that speech has improved.  States she has had the symptoms in the past before when her sugar was "very high".  Review of Systems  Positive: See above Negative: See above  Physical Exam  BP (!) 153/82 (BP Location: Left Arm)   Pulse 75   Temp 98.4 F (36.9 C)   Resp 18   Ht  (1.702 m)   Wt 113.4 kg   SpO2 98%   BMI 39.16 kg/m  Gen:   Awake, no distress   Resp:  Normal effort  MSK:   Moves extremities without difficulty  Other:  3/5 grip strength in the right hand, 5/5 in the left hand.  Patient reluctant to ambulate stating she may fall due to the weakness in her right leg.  Medical Decision Making  Medically screening exam initiated at 4:07 PM.  Appropriate orders placed.  [name] was informed that the remainder of the evaluation will be completed by another provider, this initial triage assessment does not replace that evaluation, and the importance of remaining in the ED until their evaluation is complete.  Work up started   Gareth Eagle, PA-C 04/11/23 1609

## 2023-04-11 NOTE — ED Provider Notes (Signed)
Clinical Course as of 04/11/23 2046  Thu Apr 11, 2023  1933 I spoke with Dr. Wilford Corner who recommended MRI prior to admission because this area is not as sensitive for acute strokes.  [VK]  2018 MRI with findings concerning for acute/subacute stroke. Dr. Wilford Corner recommended medicine admission for further stroke management.  [VK]    Clinical Course User Index [VK] Rexford Maus, DO      Rexford Maus, [place] 04/11/23 2046

## 2023-04-11 NOTE — H&P (Signed)
History and Physical      Latoya Roth ZOX:[id number] DOB: 02-25-1971 DOA: 04/11/2023  PCP: Latoya Plum, MD  Patient coming from: home   I have personally briefly reviewed patient's old medical records in [facility]  Chief Complaint: slurred speech  HPI: Latoya Roth is a 52 y.o. female with medical history significant for type 2 diabetes mellitus, essential pretension, HIV, moderate persistent asthma, who is admitted to [facility] on 04/11/2023 with acute ischemic stroke after presenting from home to [facility] ED complaining of slurred speech.   The patient reports that on 04/09/23 she experienced sudden onset slurred speech, right upper extremity weakness, and will right-sided facial droop, and notes that the symptoms have been persistent since onset.  Denies any associated acute focal numbness, paresthesias, dysphagia, dizziness, vertigo, nausea, vomiting, acute change in vision, word finding difficulties, or headache.  She also denies any associated chest pain, shortness of breath, palpitations, diaphoresis, recent PE, or syncope.  Denies any previous history of stroke. Medical/social history notable for type 2 diabetes mellitus, essential hypertension, while pt denies any known h/o hyperlipidemia, paroxysmal atrial fibrillation, or obstructive sleep apnea.  She conveys that she is a former smoker, having quit smoking completely several years ago.  On no antiplatelet or anticoagulation medications at the time of the stroke, including no aspirin. Not currently taking a statin.     ED Course:  Vital signs in the ED were notable for the following: Afebrile; heart rates in the 70s 87.  Blood pressures in the 140s and 77 respiratory rate 18; oxygen saturation 98 to 100% on room air.  Labs were notable for the following: CMP notable for sodium 135, creatinine 0.81, glucose 197.  CBC notable for will with cell count 7700, hemoglobin 3.9, platelet count 414.  INR  1.0.  Urinalysis showed no white blood cells and was leukocyte esterase/nitrate negative.  Urinary drug screen was pan negative.  Serum ethanol level less than 10.  Per my interpretation, EKG in ED demonstrated the following: Sinus rhythm with heart rate 74, normal intervals, no evidence of T wave or ST changes, clearness history elevation.  Imaging and additional notable ED work-up: Noncontrast CT head showed increased hypodensity in the left pons, favored to represent an acute versus early subacute infarct, will demonstrating no evidence of intracranial hemorrhage.  MRI brain without contrast, performed radiology read, showed acute infarct in the left paramedian pons, will demonstrate no evidence of acute hemorrhage or any evidence of mass effect/midline shift.  EDP discussed patient's case and imaging with the on-call neurologist, Dr.  Wilford Corner, who recommended admission to the hospitalist service for further evaluation/management of presenting acute ischemic stroke, including further assessment of potential modifiable ischemic CVA risk factors.  Neurology to formally consult, with additional recommendations to follow.  While in the ED, the following were administered: Versed 2 mg IV x 1 in preparation for aforementioned MRI brain.  Subsequently, the patient was admitted for overnight observation for further evaluation management of presenting acute ischemic stroke.    Review of Systems: As per HPI otherwise 10 point review of systems negative.   Past Medical History:  Diagnosis Date   Anxiety    Arthritis    Asthma    Bell's palsy    BV (bacterial vaginosis)    Depression    Diabetes mellitus    HIV disease    Hypertension    Obesity     Past Surgical History:  Procedure Laterality Date   CHOLECYSTECTOMY  Social History:  reports that she has quit smoking. She has never used smokeless tobacco. She reports current alcohol use of about 14.0 standard drinks of alcohol per week.  She reports that she does not use drugs.   Allergies  Allergen Reactions   Penicillins Anaphylaxis    Has patient had a PCN reaction causing immediate rash, facial/tongue/throat swelling, SOB or lightheadedness with hypotension: Yes Has patient had a PCN reaction causing severe rash involving mucus membranes or skin necrosis: No Has patient had a PCN reaction that required hospitalization No Has patient had a PCN reaction occurring within the last 10 years: No If all of the above answers are "NO", then may proceed with Cephalosporin use.    Shellfish Allergy Anaphylaxis, Hives and Swelling   Amoxicillin Rash    Family History  Problem Relation Age of Onset   Hypertension Mother    Cancer Son     Family history reviewed and not pertinent    Prior to Admission medications   Medication Sig Start Date End Date Taking? Authorizing Provider  albuterol (PROVENTIL HFA;VENTOLIN HFA) 108 (90 BASE) MCG/ACT inhaler Inhale 2 puffs into the lungs every 6 (six) hours as needed for wheezing or shortness of breath.    [provider]  albuterol-ipratropium (COMBIVENT) 18-103 MCG/ACT inhaler Inhale 2 puffs into the lungs every 6 (six) hours as needed for shortness of breath. Reported on 04/27/2016    [provider]  bictegravir-emtricitabine-tenofovir AF (BIKTARVY) 50-200-25 MG TABS tablet Take 1 tablet by mouth daily. 09/20/22   Gardiner Barefoot, MD  budesonide-formoterol (SYMBICORT) 160-4.5 MCG/ACT inhaler Inhale 2 puffs into the lungs 2 (two) times daily.    [provider]  cetirizine (ZYRTEC) 10 MG tablet Take 10 mg by mouth daily.    [provider]  escitalopram (LEXAPRO) 20 MG tablet Take by mouth.    [provider]  furosemide (LASIX) 40 MG tablet Take 40 mg by mouth daily. 02/22/22   [provider]  glipiZIDE (GLUCOTROL XL) 10 MG 24 hr tablet Take 10 mg by mouth daily. 02/22/22   [provider]  hydrOXYzine (ATARAX/VISTARIL) 25 MG  tablet Take 1 tablet (25 mg total) by mouth 3 (three) times daily as needed for itching. 05/19/18   Fayrene Helper, PA-C  LANTUS SOLOSTAR 100 UNIT/ML Solostar Pen  02/22/22   [provider]  lisinopril (PRINIVIL,ZESTRIL) 10 MG tablet Take 10 mg by mouth daily.    [provider]  meloxicam (MOBIC) 15 MG tablet Take 15 mg by mouth daily. 03/18/23   [provider]  metFORMIN (GLUCOPHAGE) 500 MG tablet Take 500 mg by mouth 2 (two) times daily with a meal.    [provider]  OZEMPIC, 0.25 OR 0.5 MG/DOSE, 2 MG/3ML SOPN Inject into the skin. 02/19/23   [provider]  tiZANidine (ZANAFLEX) 4 MG tablet  02/22/22   [provider]     Objective    Physical Exam: Vitals:   04/11/23 1715 04/11/23 1730 04/11/23 1745 04/11/23 2015  BP: (!) 159/92 (!) 171/97 (!) 148/74   Pulse: 80 81 73   Resp: 18 (!) 23    Temp:    98 F (36.7 C)  SpO2: 100% 100% 100%   Weight:      Height:        General: appears to be stated age; alert, oriented Skin: warm, dry, no rash Head:  AT/Wilson Mouth:  Oral mucosa membranes appear moist, normal dentition Neck: supple; trachea midline Heart:  RRR; did not appreciate any M/R/G Lungs: CTAB, did not appreciate any wheezes, rales, or rhonchi Abdomen: + BS; soft, ND, NT Vascular: 2+ pedal pulses b/l; 2+ radial pulses b/l Extremities: no peripheral edema, no muscle wasting Neuro: 4 out of 5 strength in the right upper extremity; 5 out of 5 strength in the left upper extremity as well as bilateral lower extremities; sensation intact in upper and lower extremities bilaterally; right-sided facial droop noted; dysarthria noted. Normal muscle tone. No tremors.    Labs on Admission: I have personally reviewed following labs and imaging studies  CBC: Recent Labs  Lab 04/11/23 1606 04/11/23 1618  WBC 7.7  --   NEUTROABS 4.0  --   HGB 13.9 15.3*  HCT 42.2 45.0  MCV 80.8  --   PLT 414*  --    Basic Metabolic  Panel: Recent Labs  Lab 04/11/23 1606 04/11/23 1618  NA 135 135  K 3.6 3.7  CL 95* 97*  CO2 27  --   GLUCOSE 197* 202*  BUN 10 11  CREATININE 0.81 0.70  CALCIUM 9.5  --    GFR: Estimated Creatinine Clearance: 105.7 mL/min (by C-G formula based on SCr of 0.7 mg/dL). Liver Function Tests: Recent Labs  Lab 04/11/23 1606  AST 17  ALT 16  ALKPHOS 89  BILITOT 0.5  PROT 8.0  ALBUMIN 4.1   No results for input(s): "LIPASE", "AMYLASE" in the last 168 hours. No results for input(s): "AMMONIA" in the last 168 hours. Coagulation Profile: Recent Labs  Lab 04/11/23 1606  INR 1.0   Cardiac Enzymes: No results for input(s): "CKTOTAL", "CKMB", "CKMBINDEX", "TROPONINI" in the last 168 hours. BNP (last 3 results) No results for input(s): "PROBNP" in the last 8760 hours. HbA1C: No results for input(s): "HGBA1C" in the last 72 hours. CBG: Recent Labs  Lab 04/11/23 1613  GLUCAP 195*   Lipid Profile: No results for input(s): "CHOL", "HDL", "LDLCALC", "TRIG", "CHOLHDL", "LDLDIRECT" in the last 72 hours. Thyroid Function Tests: No results for input(s): "TSH", "T4TOTAL", "FREET4", "T3FREE", "THYROIDAB" in the last 72 hours. Anemia Panel: No results for input(s): "VITAMINB12", "FOLATE", "FERRITIN", "TIBC", "IRON", "RETICCTPCT" in the last 72 hours. Urine analysis:    Component Value Date/Time   COLORURINE YELLOW 04/11/2023 1543   APPEARANCEUR HAZY (A) 04/11/2023 1543   LABSPEC 1.014 04/11/2023 1543   PHURINE 5.0 04/11/2023 1543   GLUCOSEU NEGATIVE 04/11/2023 1543   HGBUR NEGATIVE 04/11/2023 1543   BILIRUBINUR NEGATIVE 04/11/2023 1543   BILIRUBINUR neg 01/15/2014 0933   KETONESUR NEGATIVE 04/11/2023 1543   PROTEINUR 30 (A) 04/11/2023 1543   UROBILINOGEN 1.0 03/13/2014 0902   NITRITE NEGATIVE 04/11/2023 1543   LEUKOCYTESUR NEGATIVE 04/11/2023 1543    Radiological Exams on Admission: MR BRAIN WO CONTRAST  Result Date: 04/11/2023 CLINICAL DATA:  Acute neurologic deficit  EXAM: MRI HEAD WITHOUT CONTRAST TECHNIQUE: Multiplanar, multiecho pulse sequences of the brain and surrounding structures were obtained without intravenous contrast. COMPARISON:  None Available. 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 No acute hemorrhage. No chronic microhemorrhage. Normal white matter signal, parenchymal volume and CSF spaces. The midline structures are normal. Vascular: Major flow voids are preserved. Skull and upper cervical spine: Normal calvarium and skull base. Visualized upper cervical spine and soft tissues are normal. Sinuses/Orbits:No paranasal sinus fluid levels or advanced mucosal thickening. No mastoid or middle ear effusion. Normal orbits. 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 No acute hemorrhage or mass effect. Electronically Signed   By: Caryn Bee  Chase Picket M.D.   On: 04/11/2023 20:22   CT HEAD WO CONTRAST  Result Date: 04/11/2023 CLINICAL DATA:  Right leg and arm weakness, slurred speech, facial droop EXAM: CT HEAD WITHOUT CONTRAST TECHNIQUE: Contiguous axial images were obtained from the base of the skull through the vertex without intravenous contrast. RADIATION DOSE REDUCTION: This exam was performed according to the departmental dose-optimization program which includes automated exposure control, adjustment of the mA and/or kV according to patient size and/or use of iterative reconstruction technique. COMPARISON:  04/09/2023 CT head FINDINGS: Brain: Increased hypodensity in the left pons (series 3, image 11-12), which in retrospect was quite subtle but likely present on the 04/09/2023 CT. No acute hemorrhage, mass, mass effect, or midline shift. No hydrocephalus or extra-axial collection. Vascular: No hyperdense vessel. Skull: Negative for fracture or focal lesion. Sinuses/Orbits: No acute finding. Status post bilateral lens replacements. Other: The mastoid air cells are well aerated. IMPRESSION: 1. Increased hypodensity in the left pons,  favored to represent an acute or early subacute infarct. An MRI head is recommended for further evaluation. 2. No acute hemorrhage. These results were called by telephone at the time of interpretation on 04/11/2023 at 5:41 pm to provider FORD, who verbally acknowledged these results. Electronically Signed   By: Wiliam Ke M.D.   On: 04/11/2023 17:44      Assessment/Plan   Principal Problem:   Acute ischemic stroke Active Problems:   Essential hypertension   HIV (human immunodeficiency virus infection)   DM2 (diabetes mellitus, type 2)   Depression   Moderate persistent asthma   Allergic rhinitis      #) Acute ischemic CVA: dx on the basis of acute onset of dysarthria, right upper extremity weakness, right-sided facial droop on 04/09/2023, with ensuing MRI brain showing evidence of acute infarct involving the left paramedian pons, with CT head as well as MRI brain showed no evidence of acute intracranial hemorrhage or any evidence of midline shift.  It is noted that, as the patient's acute focal neurologic deficits started 48 hours ago, she is outside of the window for consideration for tPA or thrombectomy.  EDP discussed patient's case and imaging with the on-call neurologist, Dr. Wilford Corner, who recommended admission to the hospitalist service for further evaluation/management of acute ischemic CVA, including further assessment of potential modifiable ischemic CVA risk factors.  Neurology to formally consult, with additional recommendations to follow, including recommendations regarding duration of observance of permissive hypertension as well as recommendations regarding potential antiplatelet intervention.    The patient possesses multiple modifiable CVA risk factors including a history of send hypertension, type 2 diabetes mellitus. No known history of paroxysmal atrial fibrillation. EKG in ED today showed sinus rhythm without overt evidence of acute ischemic changes. Will perform further  assessment of modifiable ischemic CVA risk factors, detailed below.  She is also noted to be a former smoker.  Of note, she is not currently on any antiplatelet medications nor any statin medications at home.   Plan: Nursing bedside swallow evaluation x 1 now, and will not initiate oral medications or diet until the patient has passed this. Head of the bed at 30 degrees. Neuro checks per protocol. VS per protocol. Will allow for permissive hypertension for a total of 72 hours fo onset of the patient's acute focal neurologic deficitsllowing pending formal neurology consultation with associated recommendations for duration of observance of permissive hypertension.  During this timeframe, will hold home antihypertensive medications, with prn IV labetalol ordered for SBP greater than 220  mmHg or DBP greater than 120 mmHg. Monitor on telemetry, including monitoring for atrial fibrillation as modifiable risk factor for acute ischemic CVA.    TTE with  bubble study has been ordered for the morning. Additionally, as component of evaluation of potential modifiable ischemic CVA risk factors, will also check lipid panel and A1c. PT/OT/STconsults have been ordered for the morning.  Neurology to formally consult, will follow further recommendations regarding potential antiplatelet intervention.              #) Type 2 Diabetes Mellitus: documented history of such. Home insulin regimen: None. Home oral hypoglycemic agents: Metformin, glipizide.  Additionally, she is on Ozempic as an outpatient.  Presenting blood sugar: 197. Most recent A1c noted to be 12.7% when checked in May 2017.  Will check updated hemoglobin A1c level as a component evaluation of potential modifiable ischemic CVA risk factors, as further detailed above.   Plan: accuchecks QAC and HS with low dose SSI. hold home oral hypoglycemic agents during this hospitalization.  Add on hemoglobin A1c level, as above.              #)  Depression: documented h/o such. On Lexapro as outpatient.  Of note, presenting EKG shows no evidence of QTc prolongation.  Plan: Continue home Lexapro.             #) HIV: Documented history of such, with most recent CD4 count on 03/13/2023 noted to be 848, at which time viral load was found to be undetectable.  She is on Capitola as an outpatient.  Plan: continue home Biktarvy .             #) History of moderate persistent asthma: Documented history of such, without clinical evidence to suggest acute exacerbation at this time.  Outpatient respiratory regimen includes scheduled Symbicort as well as as needed albuterol inhaler.  Plan: Continue home Symbicort..  Albuterol nebulizer.  Add on serum magnesium level.              #) Allergic Rhinitis: documented h/o such, on scheduled Zyrtec in the absence of intranasal corticosteroid as outpatient.    Plan: cont home Zyrtec.         DVT prophylaxis: SCD's   Code Status: Full code Family Communication: none Disposition Plan: Per Rounding Team Consults called: EDP d/w on-call neurology, Dr.Arora, Who will formally consult, as further detailed above;  Admission status: Observation     I SPENT GREATER THAN 75  MINUTES IN CLINICAL CARE TIME/MEDICAL DECISION-MAKING IN COMPLETING THIS ADMISSION.      Chaney Born Edmar Blankenburg DO [facility]  From 7PM - 7AM   04/11/2023, 9:29 PM

## 2023-04-12 ENCOUNTER — Observation Stay (HOSPITAL_COMMUNITY): Payer: Medicaid Other

## 2023-04-12 ENCOUNTER — Encounter (HOSPITAL_COMMUNITY): Payer: Self-pay | Admitting: Internal Medicine

## 2023-04-12 DIAGNOSIS — J4540 Moderate persistent asthma, uncomplicated: Secondary | ICD-10-CM | POA: Insufficient documentation

## 2023-04-12 DIAGNOSIS — E785 Hyperlipidemia, unspecified: Secondary | ICD-10-CM | POA: Diagnosis present

## 2023-04-12 DIAGNOSIS — Z79899 Other long term (current) drug therapy: Secondary | ICD-10-CM | POA: Diagnosis not present

## 2023-04-12 DIAGNOSIS — Z87891 Personal history of nicotine dependence: Secondary | ICD-10-CM | POA: Diagnosis not present

## 2023-04-12 DIAGNOSIS — W19XXXA Unspecified fall, initial encounter: Secondary | ICD-10-CM | POA: Diagnosis present

## 2023-04-12 DIAGNOSIS — Z6839 Body mass index (BMI) 39.0-39.9, adult: Secondary | ICD-10-CM | POA: Diagnosis not present

## 2023-04-12 DIAGNOSIS — R29810 Facial weakness: Secondary | ICD-10-CM | POA: Diagnosis present

## 2023-04-12 DIAGNOSIS — I6381 Other cerebral infarction due to occlusion or stenosis of small artery: Secondary | ICD-10-CM | POA: Diagnosis present

## 2023-04-12 DIAGNOSIS — J309 Allergic rhinitis, unspecified: Secondary | ICD-10-CM | POA: Diagnosis present

## 2023-04-12 DIAGNOSIS — R471 Dysarthria and anarthria: Secondary | ICD-10-CM | POA: Diagnosis present

## 2023-04-12 DIAGNOSIS — F419 Anxiety disorder, unspecified: Secondary | ICD-10-CM | POA: Diagnosis present

## 2023-04-12 DIAGNOSIS — R29708 NIHSS score 8: Secondary | ICD-10-CM | POA: Diagnosis present

## 2023-04-12 DIAGNOSIS — J301 Allergic rhinitis due to pollen: Secondary | ICD-10-CM | POA: Diagnosis not present

## 2023-04-12 DIAGNOSIS — E1165 Type 2 diabetes mellitus with hyperglycemia: Secondary | ICD-10-CM | POA: Diagnosis present

## 2023-04-12 DIAGNOSIS — I69351 Hemiplegia and hemiparesis following cerebral infarction affecting right dominant side: Secondary | ICD-10-CM | POA: Diagnosis not present

## 2023-04-12 DIAGNOSIS — B20 Human immunodeficiency virus [HIV] disease: Secondary | ICD-10-CM | POA: Diagnosis present

## 2023-04-12 DIAGNOSIS — I6389 Other cerebral infarction: Secondary | ICD-10-CM | POA: Diagnosis not present

## 2023-04-12 DIAGNOSIS — F32A Depression, unspecified: Secondary | ICD-10-CM | POA: Diagnosis not present

## 2023-04-12 DIAGNOSIS — Z794 Long term (current) use of insulin: Secondary | ICD-10-CM | POA: Diagnosis not present

## 2023-04-12 DIAGNOSIS — I10 Essential (primary) hypertension: Secondary | ICD-10-CM | POA: Diagnosis present

## 2023-04-12 DIAGNOSIS — Z7985 Long-term (current) use of injectable non-insulin antidiabetic drugs: Secondary | ICD-10-CM | POA: Diagnosis not present

## 2023-04-12 DIAGNOSIS — M1990 Unspecified osteoarthritis, unspecified site: Secondary | ICD-10-CM | POA: Diagnosis present

## 2023-04-12 DIAGNOSIS — I69398 Other sequelae of cerebral infarction: Secondary | ICD-10-CM | POA: Diagnosis not present

## 2023-04-12 DIAGNOSIS — E669 Obesity, unspecified: Secondary | ICD-10-CM | POA: Diagnosis present

## 2023-04-12 DIAGNOSIS — E119 Type 2 diabetes mellitus without complications: Secondary | ICD-10-CM | POA: Diagnosis not present

## 2023-04-12 DIAGNOSIS — Z7951 Long term (current) use of inhaled steroids: Secondary | ICD-10-CM | POA: Diagnosis not present

## 2023-04-12 DIAGNOSIS — Z881 Allergy status to other antibiotic agents status: Secondary | ICD-10-CM | POA: Diagnosis not present

## 2023-04-12 DIAGNOSIS — I639 Cerebral infarction, unspecified: Secondary | ICD-10-CM | POA: Diagnosis not present

## 2023-04-12 DIAGNOSIS — Z7984 Long term (current) use of oral hypoglycemic drugs: Secondary | ICD-10-CM | POA: Diagnosis not present

## 2023-04-12 DIAGNOSIS — E871 Hypo-osmolality and hyponatremia: Secondary | ICD-10-CM | POA: Diagnosis present

## 2023-04-12 DIAGNOSIS — G8191 Hemiplegia, unspecified affecting right dominant side: Secondary | ICD-10-CM | POA: Diagnosis present

## 2023-04-12 LAB — GLUCOSE, CAPILLARY
Glucose-Capillary: 222 mg/dL — ABNORMAL HIGH (ref 70–99)
Glucose-Capillary: 263 mg/dL — ABNORMAL HIGH (ref 70–99)
Glucose-Capillary: 303 mg/dL — ABNORMAL HIGH (ref 70–99)

## 2023-04-12 LAB — CBG MONITORING, ED: Glucose-Capillary: 382 mg/dL — ABNORMAL HIGH (ref 70–99)

## 2023-04-12 LAB — CBC WITH DIFFERENTIAL/PLATELET
Abs Immature Granulocytes: 0.03 10*3/uL (ref 0.00–0.07)
Basophils Absolute: 0 10*3/uL (ref 0.0–0.1)
Basophils Relative: 1 %
Eosinophils Absolute: 0.1 10*3/uL (ref 0.0–0.5)
Eosinophils Relative: 2 %
HCT: 41.1 % (ref 36.0–46.0)
Hemoglobin: 13.5 g/dL (ref 12.0–15.0)
Immature Granulocytes: 1 %
Lymphocytes Relative: 40 %
Lymphs Abs: 2.6 10*3/uL (ref 0.7–4.0)
MCH: 26.7 pg (ref 26.0–34.0)
MCHC: 32.8 g/dL (ref 30.0–36.0)
MCV: 81.4 fL (ref 80.0–100.0)
Monocytes Absolute: 0.4 10*3/uL (ref 0.1–1.0)
Monocytes Relative: 6 %
Neutro Abs: 3.4 10*3/uL (ref 1.7–7.7)
Neutrophils Relative %: 50 %
Platelets: 378 10*3/uL (ref 150–400)
RBC: 5.05 MIL/uL (ref 3.87–5.11)
RDW: 13.5 % (ref 11.5–15.5)
WBC: 6.6 10*3/uL (ref 4.0–10.5)
nRBC: 0 % (ref 0.0–0.2)

## 2023-04-12 LAB — COMPREHENSIVE METABOLIC PANEL
ALT: 15 U/L (ref 0–44)
AST: 17 U/L (ref 15–41)
Albumin: 3.5 g/dL (ref 3.5–5.0)
Alkaline Phosphatase: 80 U/L (ref 38–126)
Anion gap: 8 (ref 5–15)
BUN: 11 mg/dL (ref 6–20)
CO2: 27 mmol/L (ref 22–32)
Calcium: 9 mg/dL (ref 8.9–10.3)
Chloride: 96 mmol/L — ABNORMAL LOW (ref 98–111)
Creatinine, Ser: 0.95 mg/dL (ref 0.44–1.00)
GFR, Estimated: >60 mL/min (ref >60)
Glucose, Bld: 271 mg/dL — ABNORMAL HIGH (ref 70–99)
Potassium: 3.7 mmol/L (ref 3.5–5.1)
Sodium: 131 mmol/L — ABNORMAL LOW (ref 135–145)
Total Bilirubin: 0.5 mg/dL (ref 0.3–1.2)
Total Protein: 7.2 g/dL (ref 6.5–8.1)

## 2023-04-12 LAB — LIPID PANEL
Cholesterol: 187 mg/dL (ref 0–200)
HDL: 47 mg/dL (ref >40)
LDL Cholesterol: 92 mg/dL (ref 0–99)
Total CHOL/HDL Ratio: 4 RATIO
Triglycerides: 241 mg/dL — ABNORMAL HIGH (ref <150)
VLDL: 48 mg/dL — ABNORMAL HIGH (ref 0–40)

## 2023-04-12 LAB — ECHOCARDIOGRAM COMPLETE BUBBLE STUDY
Area-P 1/2: 2.55 cm2
Calc EF: 55.6 %
S' Lateral: 2.4 cm
Single Plane A2C EF: 55.8 %
Single Plane A4C EF: 54.6 %

## 2023-04-12 LAB — MAGNESIUM
Magnesium: 1.8 mg/dL (ref 1.7–2.4)
Magnesium: 1.8 mg/dL (ref 1.7–2.4)

## 2023-04-12 LAB — HEMOGLOBIN A1C
Hgb A1c MFr Bld: 8 % — ABNORMAL HIGH (ref 4.8–5.6)
Mean Plasma Glucose: 182.9 mg/dL

## 2023-04-12 MED ORDER — ESCITALOPRAM OXALATE 10 MG PO TABS: 20.0000 mg | ORAL_TABLET | Freq: Every day | ORAL | Status: DC

## 2023-04-12 MED ORDER — BICTEGRAVIR-EMTRICITAB-TENOFOV 50-200-25 MG PO TABS
1.0000 | ORAL_TABLET | Freq: Every day | ORAL | Status: DC
  Administered 2023-04-12 – 2023-04-16 (×5): 1 via ORAL
  Filled 2023-04-12 (×5): qty 1

## 2023-04-12 MED ORDER — IOHEXOL 350 MG/ML SOLN
75.0000 mL | Freq: Once | INTRAVENOUS | Status: AC | PRN
  Administered 2023-04-12: 75 mL via INTRAVENOUS

## 2023-04-12 MED ORDER — MOMETASONE FURO-FORMOTEROL FUM 200-5 MCG/ACT IN AERO
2.0000 | INHALATION_SPRAY | Freq: Two times a day (BID) | RESPIRATORY_TRACT | Status: DC
  Administered 2023-04-12 – 2023-04-16 (×9): 2 via RESPIRATORY_TRACT
  Filled 2023-04-12: qty 8.8

## 2023-04-12 MED ORDER — ASPIRIN 81 MG PO TBEC
81.0000 mg | DELAYED_RELEASE_TABLET | Freq: Every day | ORAL | Status: DC
  Administered 2023-04-12 – 2023-04-16 (×5): 81 mg via ORAL
  Filled 2023-04-12 (×5): qty 1

## 2023-04-12 MED ORDER — LABETALOL HCL 5 MG/ML IV SOLN: 10.0000 mg | INTRAVENOUS | Status: DC | PRN

## 2023-04-12 MED ORDER — LORATADINE 10 MG PO TABS
10.0000 mg | ORAL_TABLET | Freq: Every day | ORAL | Status: DC
  Administered 2023-04-12 – 2023-04-16 (×5): 10 mg via ORAL
  Filled 2023-04-12 (×5): qty 1

## 2023-04-12 MED ORDER — LIP MEDEX EX OINT
TOPICAL_OINTMENT | CUTANEOUS | Status: DC | PRN
  Administered 2023-04-16: 75 via TOPICAL
  Filled 2023-04-12 (×2): qty 7

## 2023-04-12 MED ORDER — ENOXAPARIN SODIUM 40 MG/0.4ML IJ SOSY
40.0000 mg | PREFILLED_SYRINGE | INTRAMUSCULAR | Status: DC
  Administered 2023-04-12 – 2023-04-15 (×4): 40 mg via SUBCUTANEOUS
  Filled 2023-04-12 (×4): qty 0.4

## 2023-04-12 MED ORDER — ROSUVASTATIN CALCIUM 20 MG PO TABS
20.0000 mg | ORAL_TABLET | Freq: Every day | ORAL | Status: DC
  Administered 2023-04-12 – 2023-04-16 (×5): 20 mg via ORAL
  Filled 2023-04-12 (×5): qty 1

## 2023-04-12 MED ORDER — ALBUTEROL SULFATE (2.5 MG/3ML) 0.083% IN NEBU: 2.5000 mg | INHALATION_SOLUTION | RESPIRATORY_TRACT | Status: DC | PRN

## 2023-04-12 MED ORDER — LOSARTAN POTASSIUM 50 MG PO TABS
50.0000 mg | ORAL_TABLET | Freq: Every day | ORAL | Status: DC
  Administered 2023-04-12 – 2023-04-16 (×5): 50 mg via ORAL
  Filled 2023-04-12 (×5): qty 1

## 2023-04-12 MED ORDER — CLOPIDOGREL BISULFATE 75 MG PO TABS
75.0000 mg | ORAL_TABLET | Freq: Every day | ORAL | Status: DC
  Administered 2023-04-12 – 2023-04-16 (×5): 75 mg via ORAL
  Filled 2023-04-12 (×5): qty 1

## 2023-04-12 NOTE — Plan of Care (Signed)
  Problem: Education: Goal: Knowledge of secondary prevention will improve (MUST DOCUMENT ALL) Outcome: Progressing   Problem: Ischemic Stroke/TIA Tissue Perfusion: Goal: Complications of ischemic stroke/TIA will be minimized Outcome: Progressing

## 2023-04-12 NOTE — ED Notes (Signed)
I have requested Dulera from pharmacy but have not received yet.

## 2023-04-12 NOTE — Evaluation (Signed)
Physical Therapy Evaluation Patient Details Name: Latoya Roth MRN: [id number] DOB: 02-19-1971 Today's Date: 04/12/2023  History of Present Illness  Patient is a 52 y.o. female who was admitted to [facility] on 04/11/2023 with acute ischemic stroke after presenting from home to [facility] ED complaining of slurred speech. MRI revealed  acute infarct in the left paramedian pons, will demonstrate no evidence of acute hemorrhage or any evidence of mass effect/midline shift. PMH significant for anxiety, depression, DMII, HTN, HIV, moderate persistent asthma.   Clinical Impression  Latoya Roth is 52 y.o. female admitted with above HPI and diagnosis. Patient is currently limited by functional impairments below (see PT problem list). Patient lives alone and is independent at baseline with SPC due to prior LE weakness. Currently pt is limited by Rt hemiparesis, impaired coordination on Rt, decreased awareness of deficits and poor safety awareness. Pt requires Mod assist for sit<>stand with and with RW and Min assist for gait in room to ambulate to bathroom and back to recliner.  Patient will benefit from continued skilled PT interventions to address impairments and progress independence with mobility, recommending intense rehab follow up of >3 hours/day. Acute PT will follow and progress as able.        Recommendations for follow up therapy are one component of a multi-disciplinary discharge planning process, led by the attending physician.  Recommendations may be updated based on patient status, additional functional criteria and insurance authorization.  Follow Up Recommendations       Assistance Recommended at Discharge Frequent or constant Supervision/Assistance  Patient can return home with the following  A little help with bathing/dressing/bathroom;Assistance with cooking/housework;A lot of help with walking and/or transfers;Direct supervision/assist for medications management;Assist  for transportation;Help with stairs or ramp for entrance    Equipment Recommendations Rolling walker (2 wheels) (TBD)  Recommendations for Other Services  Rehab consult    Functional Status Assessment Patient has had a recent decline in their functional status and demonstrates the ability to make significant improvements in function in a reasonable and predictable amount of time.     Precautions / Restrictions Precautions Precautions: Fall Restrictions Weight Bearing Restrictions: No      Mobility  Bed Mobility               General bed mobility comments: pt sitting EOB at start of session.    Transfers Overall transfer level: Needs assistance Equipment used: Rolling walker (2 wheels), 1 person hand held assist Transfers: Sit to/from Stand Sit to Stand: Mod assist           General transfer comment: pt exerting significant effort and Mod assist required for sit<>stand with HHA and with RW. Pt able to maintain standing balacne with min guard.    Ambulation/Gait Ambulation/Gait assistance: Min assist Gait Distance (Feet): 40 Feet (15, 25) Assistive device: Rolling walker (2 wheels) Gait Pattern/deviations: Step-to pattern, Decreased step length - left, Decreased stance time - right, Decreased stride length, Decreased dorsiflexion - right, Knee hyperextension - right, Wide base of support Gait velocity: decr     General Gait Details: pt with  decreased  Stairs            Wheelchair Mobility    Modified Rankin (Stroke Patients Only)       Balance Overall balance assessment: Needs assistance Sitting-balance support: Feet supported Sitting balance-Leahy Scale: Fair Sitting balance - Comments: able to sit without UE support and perform pericare on toilet, no significant weight shift to do so  Standing balance support: During functional activity, Bilateral upper extremity supported, Reliant on assistive device for balance Standing balance-Leahy Scale:  Poor                               Pertinent Vitals/Pain Pain Assessment Pain Assessment: No/denies pain    Home Living Family/patient expects to be discharged to:: Private residence Living Arrangements: Alone Available Help at Discharge: Family (son lives in town, has a friend to help if needed) Type of Home: Apartment Home Access: Level entry       Home Layout: One level Home Equipment: Gilmer Mor - single point      Prior Function Prior Level of Function : Independent/Modified Independent             Mobility Comments: uses SPC for about 5months, started falling back then       Hand Dominance   Dominant Hand: Right    Extremity/Trunk Assessment   Upper Extremity Assessment Upper Extremity Assessment: Defer to OT evaluation;RUE deficits/detail;LUE deficits/detail RUE Deficits / Details: reports numbess/decreased senation    Lower Extremity Assessment Lower Extremity Assessment: RLE deficits/detail;LLE deficits/detail RLE Deficits / Details: hip flexion 3+/5, knee ext/flex 3+/5, 3-/5 dorsiflexion. pt unable to complete heel to shin 2/2 weakness RLE Sensation: WNL RLE Coordination: decreased gross motor;decreased fine motor LLE Deficits / Details: 4/5 for hip flexion, 4+/5 for knee ext/flex, dorsiflexion/plantar flexoin LLE Sensation: WNL LLE Coordination: WNL    Cervical / Trunk Assessment Cervical / Trunk Assessment: Normal;Other exceptions Cervical / Trunk Exceptions: habitus  Communication   Communication: Expressive difficulties (slurred dysarthria)  Cognition Arousal/Alertness: Awake/alert Behavior During Therapy: WFL for tasks assessed/performed Overall Cognitive Status: Within Functional Limits for tasks assessed                                          General Comments      Exercises     Assessment/Plan    PT Assessment Patient needs continued PT services  PT Problem List Decreased strength;Decreased range of  motion;Decreased activity tolerance;Decreased balance;Decreased mobility;Decreased knowledge of use of DME;Decreased knowledge of precautions       PT Treatment Interventions DME instruction;Neuromuscular re-education;Balance training;Therapeutic exercise;Therapeutic activities;Functional mobility training;Stair training;Gait training;Patient/family education;Cognitive remediation    PT Goals (Current goals can be found in the Care Plan section)  Acute Rehab PT Goals Patient Stated Goal: regain independence PT Goal Formulation: With patient Time For Goal Achievement: 04/26/23 Potential to Achieve Goals: Good    Frequency Min 4X/week     Co-evaluation               AM-PAC PT "6 Clicks" Mobility  Outcome Measure Help needed turning from your back to your side while in a flat bed without using bedrails?: A Little Help needed moving from lying on your back to sitting on the side of a flat bed without using bedrails?: A Little Help needed moving to and from a bed to a chair (including a wheelchair)?: A Lot Help needed standing up from a chair using your arms (e.g., wheelchair or bedside chair)?: A Lot Help needed to walk in hospital room?: A Little Help needed climbing 3-5 steps with a railing? : A Lot 6 Click Score: 15    End of Session Equipment Utilized During Treatment: Gait belt Activity Tolerance: Patient tolerated treatment well Patient left: in  chair;with call bell/phone within reach;with chair alarm set (alarm on, NS notified no cord to attach to nursing station) Nurse Communication: Mobility status PT Visit Diagnosis: Other abnormalities of gait and mobility (R26.89);Difficulty in walking, not elsewhere classified (R26.2);Muscle weakness (generalized) (M62.81);Other symptoms and signs involving the nervous system (Z61.096)    Time: 0454-0981 PT Time Calculation (min) (ACUTE ONLY): 36 min   Charges:   PT Evaluation $PT Eval Moderate Complexity: 1 Mod PT  Treatments $Gait Training: 8-22 mins       Wynn Maudlin, DPT Acute Rehabilitation Services Office [phone]  04/12/23 1:00 PM

## 2023-04-12 NOTE — Inpatient Diabetes Management (Signed)
Inpatient Diabetes Program Recommendations  AACE/ADA: New Consensus Statement on Inpatient Glycemic Control (2015)  Target Ranges:  Prepandial:   less than 140 mg/dL      Peak postprandial:   less than 180 mg/dL (1-2 hours)      Critically ill patients:  140 - 180 mg/dL      Spoke with pt at bedside regarding glucose control at home. Pt reports needing a glucometer at discharge. She was not checking her glucose trends at home. Pt reports being compliant with medication. Pt could have some improvement in diet. Encouraged glucose control to prevent further complications of diabetes.   D/c supplies: Blood Glucose meter kit order # [id number]

## 2023-04-12 NOTE — Progress Notes (Signed)
STROKE TEAM PROGRESS NOTE   SUBJECTIVE (INTERVAL HISTORY) Her RN is at the bedside.  Overall her condition is gradually improving.  She still has right facial droop, right sided hemiparesis.  Complaining of headache at the back of head.  Education of risk factor modification given.   OBJECTIVE Temp:  [97.7 F (36.5 C)-98.5 F (36.9 C)] 98.4 F (36.9 C) (04/19 1520) Pulse Rate:  [64-84] 77 (04/19 1520) Resp:  [16-18] 18 (04/19 1520) BP: (99-162)/(81-131) 132/97 (04/19 1520) SpO2:  [96 %-100 %] 99 % (04/19 1520) Weight:  [113.9 kg] 113.9 kg (04/19 1034)  Recent Labs  Lab 04/11/23 1613 04/12/23 0744 04/12/23 1148 04/12/23 1608  GLUCAP 195* 382* 222* 263*   Recent Labs  Lab 04/11/23 1606 04/11/23 1618 04/12/23 0100  NA 135 135 131*  K 3.6 3.7 3.7  CL 95* 97* 96*  CO2 27  --  27  GLUCOSE 197* 202* 271*  BUN CREATININE 0.81 0.70 0.95  CALCIUM 9.5  --  9.0  MG  --   --  1.8  1.8   Recent Labs  Lab 04/11/23 1606 04/12/23 0100  AST 17 17  ALT 16 15  ALKPHOS 89 80  BILITOT 0.5 0.5  PROT 8.0 7.2  ALBUMIN 4.1 3.5   Recent Labs  Lab 04/11/23 1606 04/11/23 1618 04/12/23 0100  WBC 7.7  --  6.6  NEUTROABS 4.0  --  3.4  HGB 13.9 15.3* 13.5  HCT 42.2 45.0 41.1  MCV 80.8  --  81.4  PLT 414*  --  378   No results for input(s): "CKTOTAL", "CKMB", "CKMBINDEX", "TROPONINI" in the last 168 hours. Recent Labs    04/11/23 1606  LABPROT 12.9  INR 1.0   Recent Labs    04/11/23 1543  COLORURINE YELLOW  LABSPEC 1.014  PHURINE 5.0  GLUCOSEU NEGATIVE  HGBUR NEGATIVE  BILIRUBINUR NEGATIVE  KETONESUR NEGATIVE  PROTEINUR 30*  NITRITE NEGATIVE  LEUKOCYTESUR NEGATIVE       Component Value Date/Time   CHOL 187 04/12/2023 0100   TRIG 241 (H) 04/12/2023 0100   HDL 47 04/12/2023 0100   CHOLHDL 4.0 04/12/2023 0100   VLDL 48 (H) 04/12/2023 0100   LDLCALC 92 04/12/2023 0100   LDLCALC 105 (H) 09/20/2022 1058   Lab Results  Component Value Date   HGBA1C  8.0 (H) 04/11/2023      Component Value Date/Time   LABOPIA NONE DETECTED 04/11/2023 1543   COCAINSCRNUR NONE DETECTED 04/11/2023 1543   LABBENZ NONE DETECTED 04/11/2023 1543   AMPHETMU NONE DETECTED 04/11/2023 1543   THCU NONE DETECTED 04/11/2023 1543   LABBARB NONE DETECTED 04/11/2023 1543    Recent Labs  Lab 04/11/23 1606  ETH <10    I have personally reviewed the radiological images below and agree with the radiology interpretations.  ECHOCARDIOGRAM COMPLETE BUBBLE STUDY  Result Date: 04/12/2023    ECHOCARDIOGRAM REPORT   Patient Name:   Latoya Roth Date of Exam: 04/12/2023 Medical Rec #:  [id number]           Height:       67.0 in Accession #:    [id number]          Weight:       250.0 lb Date of Birth:  09-Nov-1971           BSA:          2.223 m Patient Age:    52 years  BP:           145/88 mmHg Patient Gender: F                   HR:           70 bpm. Exam Location:  Inpatient Procedure: 2D Echo, Cardiac Doppler, Color Doppler and Saline Contrast Bubble            Study Indications:    stroke  History:        Patient has no prior history of Echocardiogram examinations.                 Risk Factors:Hypertension and Diabetes.  Sonographer:    Mike Gip Referring Phys: [id number] JUSTIN B HOWERTER IMPRESSIONS  1. Left ventricular ejection fraction, by estimation, is 55 to 60%. The left ventricle has normal function. The left ventricle has no regional wall motion abnormalities. There is mild concentric left ventricular hypertrophy. Left ventricular diastolic parameters were normal.  2. Right ventricular systolic function is normal. The right ventricular size is normal.  3. The mitral valve is normal in structure. Trivial mitral valve regurgitation. No evidence of mitral stenosis.  4. The aortic valve is normal in structure. Aortic valve regurgitation is not visualized. No aortic stenosis is present.  5. Agitated saline contrast bubble study was negative, with no evidence of  any interatrial shunt. FINDINGS  Left Ventricle: Left ventricular ejection fraction, by estimation, is 55 to 60%. The left ventricle has normal function. The left ventricle has no regional wall motion abnormalities. The left ventricular internal cavity size was normal in size. There is  mild concentric left ventricular hypertrophy. Left ventricular diastolic parameters were normal. Right Ventricle: The right ventricular size is normal. No increase in right ventricular wall thickness. Right ventricular systolic function is normal. Left Atrium: Left atrial size was normal in size. Right Atrium: Right atrial size was normal in size. Pericardium: There is no evidence of pericardial effusion. Mitral Valve: The mitral valve is normal in structure. Trivial mitral valve regurgitation. No evidence of mitral valve stenosis. Tricuspid Valve: The tricuspid valve is normal in structure. Tricuspid valve regurgitation is trivial. No evidence of tricuspid stenosis. Aortic Valve: The aortic valve is normal in structure. Aortic valve regurgitation is not visualized. No aortic stenosis is present. Pulmonic Valve: The pulmonic valve was normal in structure. Pulmonic valve regurgitation is not visualized. No evidence of pulmonic stenosis. Aorta: The aortic root is normal in size and structure. Venous: The inferior vena cava was not well visualized. IAS/Shunts: No atrial level shunt detected by color flow Doppler. Agitated saline contrast was given intravenously to evaluate for intracardiac shunting. Agitated saline contrast bubble study was negative, with no evidence of any interatrial shunt.  LEFT VENTRICLE PLAX 2D LVIDd:         4.00 cm      Diastology LVIDs:         2.40 cm      LV e' medial:    8.92 cm/s LV PW:         1.30 cm      LV E/e' medial:  7.8 LV IVS:        1.20 cm      LV e' lateral:   13.40 cm/s LVOT diam:     1.80 cm      LV E/e' lateral: 5.2 LV SV:         58 LV SV Index:   26 LVOT Area:  2.54 cm  LV Volumes (MOD)  LV vol d, MOD A2C: 134.0 ml LV vol d, MOD A4C: 149.0 ml LV vol s, MOD A2C: 59.2 ml LV vol s, MOD A4C: 67.7 ml LV SV MOD A2C:     74.8 ml LV SV MOD A4C:     149.0 ml LV SV MOD BP:      78.9 ml RIGHT VENTRICLE RV Basal diam:  3.70 cm RV S prime:     11.40 cm/s TAPSE (M-mode): 2.0 cm LEFT ATRIUM             Index        RIGHT ATRIUM           Index LA diam:        3.10 cm 1.39 cm/m   RA Area:     11.60 cm LA Vol (A2C):   50.1 ml 22.53 ml/m  RA Volume:   25.30 ml  11.38 ml/m LA Vol (A4C):   57.6 ml 25.91 ml/m LA Biplane Vol: 55.8 ml 25.10 ml/m  AORTIC VALVE LVOT Vmax:   107.00 cm/s LVOT Vmean:  73.100 cm/s LVOT VTI:    0.228 m  AORTA Ao Root diam: 3.10 cm Ao Asc diam:  3.30 cm MITRAL VALVE MV Area (PHT): 2.55 cm    SHUNTS MV Decel Time: 297 msec    Systemic VTI:  0.23 m MV E velocity: 69.70 cm/s  Systemic Diam: 1.80 cm MV A velocity: 76.50 cm/s MV E/A ratio:  0.91 Arvilla Meres MD Electronically signed by Arvilla Meres MD Signature Date/Time: 04/12/2023/2:31:08 PM    Final    CT ANGIO HEAD NECK W WO CM  Result Date: 04/12/2023 CLINICAL DATA:  Stroke/TIA, determine embolic source EXAM: CT ANGIOGRAPHY HEAD AND NECK WITH AND WITHOUT CONTRAST TECHNIQUE: Multidetector CT imaging of the head and neck was performed using the standard protocol during bolus administration of intravenous contrast. Multiplanar CT image reconstructions and MIPs were obtained to evaluate the vascular anatomy. Carotid stenosis measurements (when applicable) are obtained utilizing NASCET criteria, using the distal internal carotid diameter as the denominator. RADIATION DOSE REDUCTION: This exam was performed according to the departmental dose-optimization program which includes automated exposure control, adjustment of the mA and/or kV according to patient size and/or use of iterative reconstruction technique. CONTRAST:  75mL OMNIPAQUE IOHEXOL 350 MG/ML SOLN COMPARISON:  None Available. FINDINGS: CT HEAD FINDINGS Brain: Known pontine  infarct better characterized on recent MRI. No evidence of acute hemorrhage, mass lesion, midline shift or hydrocephalus. Vascular: See below. Skull: No acute fracture. Sinuses/Orbits: Clear sinuses.  No acute orbital findings. Other: No mastoid effusions Review of the MIP images confirms the above findings CTA NECK FINDINGS Aortic arch: Great vessel origins are patent without significant stenosis. Right carotid system: No evidence of dissection, stenosis (50% or greater), or occlusion. Left carotid system: No evidence of dissection, stenosis (50% or greater), or occlusion. Vertebral arteries: Codominant. No evidence of dissection, stenosis (50% or greater), or occlusion. Skeleton: Negative. Other neck: Negative. Upper chest: Visualized lung apices are clear. Review of the MIP images confirms the above findings CTA HEAD FINDINGS Anterior circulation: Bilateral intracranial ICAs, MCAs, and ACAs are patent without proximally image the significant stenosis. Posterior circulation: Bilateral intradural vertebral arteries, basilar artery and bilateral posterior cerebral arteries are patent without proximal hemodynamically significant stenosis. Venous sinuses: As permitted by contrast timing, patent. Review of the MIP images confirms the above findings IMPRESSION: No large vessel occlusion or proximal hemodynamically significant stenosis. Electronically Signed  By: Feliberto Harts M.D.   On: 04/12/2023 10:19   MR BRAIN WO CONTRAST  Result Date: 04/11/2023 CLINICAL DATA:  Acute neurologic deficit EXAM: MRI HEAD WITHOUT CONTRAST TECHNIQUE: Multiplanar, multiecho pulse sequences of the brain and surrounding structures were obtained without intravenous contrast. COMPARISON:  None Available. 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 No acute hemorrhage. No chronic microhemorrhage. Normal white matter signal, parenchymal volume and CSF spaces. The midline structures are normal. Vascular: Major flow voids  are preserved. Skull and upper cervical spine: Normal calvarium and skull base. Visualized upper cervical spine and soft tissues are normal. Sinuses/Orbits:No paranasal sinus fluid levels or advanced mucosal thickening. No mastoid or middle ear effusion. Normal orbits. 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 No acute hemorrhage or mass effect. Electronically Signed   By: Deatra Robinson M.D.   On: 04/11/2023 20:22   CT HEAD WO CONTRAST  Result Date: 04/11/2023 CLINICAL DATA:  Right leg and arm weakness, slurred speech, facial droop EXAM: CT HEAD WITHOUT CONTRAST TECHNIQUE: Contiguous axial images were obtained from the base of the skull through the vertex without intravenous contrast. RADIATION DOSE REDUCTION: This exam was performed according to the departmental dose-optimization program which includes automated exposure control, adjustment of the mA and/or kV according to patient size and/or use of iterative reconstruction technique. COMPARISON:  04/09/2023 CT head FINDINGS: Brain: Increased hypodensity in the left pons (series 3, image 11-12), which in retrospect was quite subtle but likely present on the 04/09/2023 CT. No acute hemorrhage, mass, mass effect, or midline shift. No hydrocephalus or extra-axial collection. Vascular: No hyperdense vessel. Skull: Negative for fracture or focal lesion. Sinuses/Orbits: No acute finding. Status post bilateral lens replacements. Other: The mastoid air cells are well aerated. IMPRESSION: 1. Increased hypodensity in the left pons, favored to represent an acute or early subacute infarct. An MRI head is recommended for further evaluation. 2. No acute hemorrhage. These results were called by telephone at the time of interpretation on 04/11/2023 at 5:41 pm to provider FORD, who verbally acknowledged these results. Electronically Signed   By: Wiliam Ke M.D.   On: 04/11/2023 17:44     PHYSICAL EXAM  Temp:  [97.7 F (36.5 C)-98.5 F (36.9 C)] 98.4 F  (36.9 C) (04/19 1520) Pulse Rate:  [64-84] 77 (04/19 1520) Resp:  [16-18] 18 (04/19 1520) BP: (99-162)/(81-131) 132/97 (04/19 1520) SpO2:  [96 %-100 %] 99 % (04/19 1520) Weight:  [113.9 kg] 113.9 kg (04/19 1034)  General - Well nourished, well developed, in no apparent distress.  Ophthalmologic - fundi not visualized due to noncooperation.  Cardiovascular - Regular rhythm and rate.  Mental Status -  Level of arousal and orientation to time, place, and person were intact. Language including expression, naming, repetition, comprehension was assessed and found intact.  Mild dysarthria Attention span and concentration were normal. Fund of Knowledge was assessed and was intact.  Cranial Nerves II - XII - II - Visual field intact OU. III, IV, VI - Extraocular movements intact. V - Facial sensation intact bilaterally. VII - right facial droop VIII - Hearing & vestibular intact bilaterally. X - Palate elevates symmetrically. XI - Chin turning & shoulder shrug intact bilaterally. XII - Tongue protrusion intact.  Motor Strength - The patient's strength was normal in left upper and lower extremities.  However, right upper extremity 3/5, right lower extremity 3+/5.  Bulk was normal and fasciculations were absent.   Motor Tone - Muscle tone was assessed at the  neck and appendages and was normal.  Reflexes - The patient's reflexes were symmetrical in all extremities and she had no pathological reflexes.  Sensory - Light touch, temperature/pinprick were assessed and were symmetrical.    Coordination - The patient had normal movements in the left hand with no ataxia or dysmetria.  Right finger-to-nose slow and incomplete due to weakness, but no gross ataxia.  Tremor was absent.  Gait and Station - deferred.   ASSESSMENT/PLAN Ms. CASSAUNDRA RASCH is a 52 y.o. female with history of hypertension, diabetes, HIV, left Bell's palsy, former smoker, obesity admitted for right-sided weakness,  right facial droop, headache, wheezing and slurred speech. No tPA given due to outside window.    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 Patient counseled to be compliant with her antithrombotic medications Ongoing aggressive stroke risk factor management Therapy recommendations: Pending Disposition: Pending  Diabetes HgbA1c 8.0 goal < 7.0 Uncontrolled CBG monitoring SSI DM education and close PCP follow up  Hypertension Stable on the high end On home losartan 50 Long term BP goal normotensive  Hyperlipidemia Home meds: None LDL 92, goal < 70 Now on Crestor 20 Continue statin at discharge  Other Stroke Risk Factors Former smoker HIV on HARRT therapy Obesity, BMI 39.33  Other Active Problems History of left Bell's palsy Mild hyponatremia, sodium 131  Hospital day # 0  Neurology will sign off. Please call with questions. Pt will follow up with stroke clinic NP at [facility] in about 4 weeks. Thanks for the consult.   Marvel Plan, MD PhD Stroke Neurology 04/12/2023 6:22 PM    To contact Stroke Continuity provider, please refer to [email or web address]. After hours, contact General Neurology

## 2023-04-12 NOTE — Progress Notes (Signed)
Inpatient Rehab Admissions Coordinator Note:   Per PT recs patient was screened for CIR candidacy by Stephania Fragmin, PT. At this time, pt appears to be a potential candidate for CIR. I will place an order for rehab consult for full assessment, per our protocol.  Please contact me any with questions.Estill Dooms, PT, DPT [phone] 04/12/23 4:14 PM

## 2023-04-12 NOTE — Progress Notes (Signed)
Echocardiogram 2D Echocardiogram has been performed.  Toni Amend 04/12/2023, 2:02 PM

## 2023-04-12 NOTE — ED Notes (Signed)
Apple juice provided

## 2023-04-12 NOTE — Inpatient Diabetes Management (Addendum)
Inpatient Diabetes Program Recommendations  AACE/ADA: New Consensus Statement on Inpatient Glycemic Control (2015)  Target Ranges:  Prepandial:   less than 140 mg/dL      Peak postprandial:   less than 180 mg/dL (1-2 hours)      Critically ill patients:  140 - 180 mg/dL   Lab Results  Component Value Date   GLUCAP 222 (H) 04/12/2023   HGBA1C 12.7 (H) 04/28/2016    Review of Glycemic Control  Latest Reference Range & Units 04/12/23 11:48  Glucose-Capillary 70 - 99 mg/dL 161 (H)  (H): Data is abnormally high Diabetes history: Type 2 DM Outpatient Diabetes medications: Glipizide 10 mg QD, Lantus 10 units QHS, Ozempic 0.5 mg qwk Current orders for Inpatient glycemic control: Novolog 0-9 units TID  Inpatient Diabetes Program Recommendations:    Consider adding Semglee 10 units QD.   A1C pending.   Thanks, Lujean Rave, MSN, RNC-OB Diabetes Coordinator [phone] (8a-5p)

## 2023-04-12 NOTE — Progress Notes (Signed)
PROGRESS NOTE    Latoya Roth  ZOX:[id number] DOB: 05/13/1971 DOA: 04/11/2023 PCP: Jackie Plum, MD    Brief Narrative:  Latoya Roth is a 52 y.o. female with past medical history significant for type 2 diabetes mellitus, essential pretension, HIV, moderate persistent asthma, presented to the hospital with slurred speech associated with right-sided facial droop in the ED. Vitals were relatively stable.  Labs showed sodium slightly low at 135.  Urine drug screen was negative.  Urinalysis was negative as well.  EKG showed normal sinus rhythm.  CT head showed increased hypodensity in the left pons, favored to represent an acute versus early subacute infarct. MRI brain without contrast showed acute infarct in the left paramedian pons. Neurology was consulted and patient was admitted hospital for further evaluation and treatment.  Assessment and plan   Acute ischemic CVA:  Patient presented with dysarthria, right upper extremity weakness and right facial droop.  MRI with left paramedian pons stroke.  Patient was outside the tPA window.  EKG with normal sinus rhythm.  Neurology on board.  PT OT evaluation.  Speech swallow evaluation, Continue permissive hypertension.  Check  TTE with  bubble study, lipid panel with LDL of 92.  Hemoglobin A1c pending. Continue telemetry monitoring.  Neurology recommends aspirin Plavix for 3 weeks followed by aspirin alone.  Will start statin and Plavix.  Follow neurology recommendation.  Type 2 Diabetes Mellitus:  Patient is on metformin, glipizide on hold.  Additionally, she is on Ozempic as an outpatient.  Continue sliding scale insulin.  Check hemoglobin A1c.  Depression.  On Lexapro.   HIV: Latest CD4 count on 03/13/2023 noted to be 848, at which time viral load was found to be undetectable.  Continue Biktarvy   moderate persistent asthma:  Stable at this time.  Continue albuterol and Symbicort.     Allergic Rhinitis:  Continue Zyrtec     DVT prophylaxis: SCDs Start: 04/11/23 2125   Code Status:     Code Status: Full Code  Disposition: Uncertain.  Await PT OT evaluation.  Status is: Observation  The patient will require care spanning > 2 midnights and should be moved to inpatient because: Acute stroke, right-sided weakness, neurology follow-up, possible need for rehabilitation   Family Communication: None at bedside  Consultants:  Neurology  Procedures:  None  Antimicrobials:  Biktarvy  Anti-infectives (From admission, onward)    Start     Dose/Rate Route Frequency Ordered Stop   04/12/23 1000  bictegravir-emtricitabine-tenofovir AF (BIKTARVY) 50-200-25 MG per tablet 1 tablet        1 tablet Oral Daily 04/12/23 0113        Subjective: Today, patient was seen and examined at bedside.  Complains of right-sided weakness some difficulty finding words.  Objective: Vitals:   04/12/23 0615 04/12/23 0725 04/12/23 1034 04/12/23 1059  BP: (!) 161/90 (!) 161/97 (!) 162/107   Pulse: 78 78 76   Resp: Temp:  97.7 F (36.5 C) 98.5 F (36.9 C)   TempSrc:  Axillary Oral   SpO2: 99% 100% 100% 98%  Weight:   113.9 kg   Height:    (1.702 m)    No intake or output data in the 24 hours ending 04/12/23 1138 Filed Weights   04/11/23 1558 04/12/23 1034  Weight: 113.4 kg 113.9 kg    Physical Examination: Body mass index is 39.33 kg/m.   General:  Average built, not in obvious distress HENT:   No scleral  pallor or icterus noted. Oral mucosa is moist.  Chest:  Clear breath sounds. . No crackles or wheezes.  CVS: S1 &S2 heard. No murmur.  Regular rate and rhythm. Abdomen: Soft, nontender, nondistended.  Bowel sounds are heard.   Extremities: No cyanosis, clubbing or edema.  Peripheral pulses are palpable. Psych: Alert, awake and oriented, normal mood CNS: Right facial droop, right-sided mild weakness. Skin: Warm and dry.  No rashes noted.  Data Reviewed:   CBC: Recent Labs  Lab 04/11/23 1606  04/11/23 1618 04/12/23 0100  WBC 7.7  --  6.6  NEUTROABS 4.0  --  3.4  HGB 13.9 15.3* 13.5  HCT 42.2 45.0 41.1  MCV 80.8  --  81.4  PLT 414*  --  378    Basic Metabolic Panel: Recent Labs  Lab 04/11/23 1606 04/11/23 1618 04/12/23 0100  NA 135 135 131*  K 3.6 3.7 3.7  CL 95* 97* 96*  CO2 27  --  27  GLUCOSE 197* 202* 271*  BUN CREATININE 0.81 0.70 0.95  CALCIUM 9.5  --  9.0  MG  --   --  1.8    Liver Function Tests: Recent Labs  Lab 04/11/23 1606 04/12/23 0100  AST 17 17  ALT 16 15  ALKPHOS 89 80  BILITOT 0.5 0.5  PROT 8.0 7.2  ALBUMIN 4.1 3.5     Radiology Studies: CT ANGIO HEAD NECK W WO CM  Result Date: 04/12/2023 CLINICAL DATA:  Stroke/TIA, determine embolic source EXAM: CT ANGIOGRAPHY HEAD AND NECK WITH AND WITHOUT CONTRAST TECHNIQUE: Multidetector CT imaging of the head and neck was performed using the standard protocol during bolus administration of intravenous contrast. Multiplanar CT image reconstructions and MIPs were obtained to evaluate the vascular anatomy. Carotid stenosis measurements (when applicable) are obtained utilizing NASCET criteria, using the distal internal carotid diameter as the denominator. RADIATION DOSE REDUCTION: This exam was performed according to the departmental dose-optimization program which includes automated exposure control, adjustment of the mA and/or kV according to patient size and/or use of iterative reconstruction technique. CONTRAST:  75mL OMNIPAQUE IOHEXOL 350 MG/ML SOLN COMPARISON:  None Available. FINDINGS: CT HEAD FINDINGS Brain: Known pontine infarct better characterized on recent MRI. No evidence of acute hemorrhage, mass lesion, midline shift or hydrocephalus. Vascular: See below. Skull: No acute fracture. Sinuses/Orbits: Clear sinuses.  No acute orbital findings. Other: No mastoid effusions Review of the MIP images confirms the above findings CTA NECK FINDINGS Aortic arch: Great vessel origins are patent  without significant stenosis. Right carotid system: No evidence of dissection, stenosis (50% or greater), or occlusion. Left carotid system: No evidence of dissection, stenosis (50% or greater), or occlusion. Vertebral arteries: Codominant. No evidence of dissection, stenosis (50% or greater), or occlusion. Skeleton: Negative. Other neck: Negative. Upper chest: Visualized lung apices are clear. Review of the MIP images confirms the above findings CTA HEAD FINDINGS Anterior circulation: Bilateral intracranial ICAs, MCAs, and ACAs are patent without proximally image the significant stenosis. Posterior circulation: Bilateral intradural vertebral arteries, basilar artery and bilateral posterior cerebral arteries are patent without proximal hemodynamically significant stenosis. Venous sinuses: As permitted by contrast timing, patent. Review of the MIP images confirms the above findings IMPRESSION: No large vessel occlusion or proximal hemodynamically significant stenosis. Electronically Signed   By: Feliberto Harts M.D.   On: 04/12/2023 10:19   MR BRAIN WO CONTRAST  Result Date: 04/11/2023 CLINICAL DATA:  Acute neurologic deficit EXAM: MRI HEAD WITHOUT CONTRAST TECHNIQUE: Multiplanar, multiecho  pulse sequences of the brain and surrounding structures were obtained without intravenous contrast. COMPARISON:  None Available. 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 No acute hemorrhage. No chronic microhemorrhage. Normal white matter signal, parenchymal volume and CSF spaces. The midline structures are normal. Vascular: Major flow voids are preserved. Skull and upper cervical spine: Normal calvarium and skull base. Visualized upper cervical spine and soft tissues are normal. Sinuses/Orbits:No paranasal sinus fluid levels or advanced mucosal thickening. No mastoid or middle ear effusion. Normal orbits. 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 No acute hemorrhage or mass effect.  Electronically Signed   By: Deatra Robinson M.D.   On: 04/11/2023 20:22   CT HEAD WO CONTRAST  Result Date: 04/11/2023 CLINICAL DATA:  Right leg and arm weakness, slurred speech, facial droop EXAM: CT HEAD WITHOUT CONTRAST TECHNIQUE: Contiguous axial images were obtained from the base of the skull through the vertex without intravenous contrast. RADIATION DOSE REDUCTION: This exam was performed according to the departmental dose-optimization program which includes automated exposure control, adjustment of the mA and/or kV according to patient size and/or use of iterative reconstruction technique. COMPARISON:  04/09/2023 CT head FINDINGS: Brain: Increased hypodensity in the left pons (series 3, image 11-12), which in retrospect was quite subtle but likely present on the 04/09/2023 CT. No acute hemorrhage, mass, mass effect, or midline shift. No hydrocephalus or extra-axial collection. Vascular: No hyperdense vessel. Skull: Negative for fracture or focal lesion. Sinuses/Orbits: No acute finding. Status post bilateral lens replacements. Other: The mastoid air cells are well aerated. IMPRESSION: 1. Increased hypodensity in the left pons, favored to represent an acute or early subacute infarct. An MRI head is recommended for further evaluation. 2. No acute hemorrhage. These results were called by telephone at the time of interpretation on 04/11/2023 at 5:41 pm to provider FORD, who verbally acknowledged these results. Electronically Signed   By: Wiliam Ke M.D.   On: 04/11/2023 17:44      LOS: 0 days     Joycelyn Das, MD [facility] Available via Epic secure chat 7am-7pm After these hours, please refer to coverage provider listed on [email or web address] 04/12/2023, 11:38 AM

## 2023-04-12 NOTE — ED Notes (Signed)
ED TO INPATIENT HANDOFF REPORT  ED Nurse Name and Phone #:  Marisue Ivan [phone]  R Name/Age/Gender Latoya Roth 52 y.o. female Room/Bed: 001C/001C  Code Status   Code Status: Full Code  Home/SNF/Other Home Patient oriented to: self, place, time, and situation Is this baseline? Yes   Triage Complete: Triage complete  Chief Complaint Acute ischemic stroke [I63.9]  Triage Note Pt c/o right leg weakness, slurred speech, and facial droop that started two days ago. Pt states that right arm weakness started this morning at approx 0630. Endorses fall x2 yesterday and this morning. Denies blood thinners.    Allergies Allergies  Allergen Reactions   Penicillins Anaphylaxis    Has patient had a PCN reaction causing immediate rash, facial/tongue/throat swelling, SOB or lightheadedness with hypotension: Yes Has patient had a PCN reaction causing severe rash involving mucus membranes or skin necrosis: No Has patient had a PCN reaction that required hospitalization No Has patient had a PCN reaction occurring within the last 10 years: No If all of the above answers are "NO", then may proceed with Cephalosporin use.    Shellfish Allergy Anaphylaxis, Hives and Swelling   Amoxicillin Rash    Level of Care/Admitting Diagnosis ED Disposition     ED Disposition  Admit   Condition  --   Comment  Hospital Area: [facility] [[id number]]  Level of Care: Telemetry Medical [[id number]]  May place patient in observation at [facility] or [facility] if equivalent level of care is available:: No  Covid Evaluation: Asymptomatic - no recent exposure (last 10 days) testing not required  Diagnosis: Acute ischemic stroke [360503]  Admitting Physician: Angie Fava [[id number]]  Attending Physician: Angie Fava [[id number]]          B Medical/Surgery History Past Medical History:  Diagnosis Date   Anxiety    Arthritis    Asthma    Bell's palsy    BV (bacterial vaginosis)     Depression    Diabetes mellitus    HIV disease    Hypertension    Obesity    Past Surgical History:  Procedure Laterality Date   CHOLECYSTECTOMY       A IV Location/Drains/Wounds Patient Lines/Drains/Airways Status     Active Line/Drains/Airways     Name Placement date Placement time Site Days   Peripheral IV 04/11/23 20 G Left Antecubital 04/11/23  1921  Antecubital  1            Intake/Output Last 24 hours No intake or output data in the 24 hours ending 04/12/23 8295  Labs/Imaging Results for orders placed or performed during the hospital encounter of 04/11/23 (from the past 48 hour(s))  Urine rapid drug screen (hosp performed)     Status: None   Collection Time: 04/11/23  3:43 PM  Result Value Ref Range   Opiates NONE DETECTED NONE DETECTED   Cocaine NONE DETECTED NONE DETECTED   Benzodiazepines NONE DETECTED NONE DETECTED   Amphetamines NONE DETECTED NONE DETECTED   Tetrahydrocannabinol NONE DETECTED NONE DETECTED   Barbiturates NONE DETECTED NONE DETECTED    Comment: (NOTE) DRUG SCREEN FOR MEDICAL PURPOSES ONLY.  IF CONFIRMATION IS NEEDED FOR ANY PURPOSE, NOTIFY LAB WITHIN 5 DAYS.  LOWEST DETECTABLE LIMITS FOR URINE DRUG SCREEN Drug Class                     Cutoff (ng/mL) Amphetamine and metabolites    1000 Barbiturate and metabolites    200  Benzodiazepine                 200 Opiates and metabolites        300 Cocaine and metabolites        300 THC                            50 Performed at [facility] Lab, [address], [place], [place] [address]   Urinalysis, Routine w reflex microscopic -Urine, Clean Catch     Status: Abnormal   Collection Time: 04/11/23  3:43 PM  Result Value Ref Range   Color, Urine YELLOW YELLOW   APPearance HAZY (A) CLEAR   Specific Gravity, Urine 1.014 1.005 - 1.030   pH 5.0 5.0 - 8.0   Glucose, UA NEGATIVE NEGATIVE mg/dL   Hgb urine dipstick NEGATIVE NEGATIVE   Bilirubin Urine NEGATIVE NEGATIVE   Ketones, ur  NEGATIVE NEGATIVE mg/dL   Protein, ur 30 (A) NEGATIVE mg/dL   Nitrite NEGATIVE NEGATIVE   Leukocytes,Ua NEGATIVE NEGATIVE   RBC / HPF 0-5 0 - 5 RBC/hpf   WBC, UA 0-5 0 - 5 WBC/hpf   Bacteria, UA NONE SEEN NONE SEEN   Squamous Epithelial / HPF 0-5 0 - 5 /HPF   Mucus PRESENT     Comment: Performed at [facility], [address], [place], [place] [address]  Ethanol     Status: None   Collection Time: 04/11/23  4:06 PM  Result Value Ref Range   Alcohol, Ethyl (B) <10 <10 mg/dL    Comment: (NOTE) Lowest detectable limit for serum alcohol is 10 mg/dL.  For medical purposes only. Performed at [facility], [address], [place], [place] [address]   Protime-INR     Status: None   Collection Time: 04/11/23  4:06 PM  Result Value Ref Range   Prothrombin Time 12.9 11.4 - 15.2 seconds   INR 1.0 0.8 - 1.2    Comment: (NOTE) INR goal varies based on device and disease states. Performed at [facility] Lab, [address] [place], [place] [address]   APTT     Status: None   Collection Time: 04/11/23  4:06 PM  Result Value Ref Range   aPTT 25 24 - 36 seconds    Comment: Performed at [facility] Lab, [address], [place], [place] [address]  CBC     Status: Abnormal   Collection Time: 04/11/23  4:06 PM  Result Value Ref Range   WBC 7.7 4.0 - 10.5 K/uL   RBC 5.22 (H) 3.87 - 5.11 MIL/uL   Hemoglobin 13.9 12.0 - 15.0 g/dL   HCT 69.6 29.5 - 28.4 %   MCV 80.8 80.0 - 100.0 fL   MCH 26.6 26.0 - 34.0 pg   MCHC 32.9 30.0 - 36.0 g/dL   RDW 13.2 44.0 - 10.2 %   Platelets 414 (H) 150 - 400 K/uL   nRBC 0.0 0.0 - 0.2 %    Comment: Performed at [facility], [address], [place], [place] [address]  Differential     Status: None   Collection Time: 04/11/23  4:06 PM  Result Value Ref Range   Neutrophils Relative % 52 %   Neutro Abs 4.0 1.7 - 7.7 K/uL   Lymphocytes Relative 38 %   Lymphs Abs 2.9 0.7 - 4.0 K/uL   Monocytes Relative 8 %   Monocytes Absolute  0.6  0.1 - 1.0 K/uL   Eosinophils Relative 2 %   Eosinophils Absolute 0.1 0.0 - 0.5 K/uL   Basophils Relative 0 %   Basophils Absolute 0.0 0.0 - 0.1 K/uL   Immature Granulocytes 0 %   Abs Immature Granulocytes 0.03 0.00 - 0.07 K/uL    Comment: Performed at [facility], [address], [place], [place] [address]  Comprehensive metabolic panel     Status: Abnormal   Collection Time: 04/11/23  4:06 PM  Result Value Ref Range   Sodium 135 135 - 145 mmol/L   Potassium 3.6 3.5 - 5.1 mmol/L   Chloride 95 (L) 98 - 111 mmol/L   CO2 27 22 - 32 mmol/L   Glucose, Bld 197 (H) 70 - 99 mg/dL    Comment: Glucose reference range applies only to samples taken after fasting for at least 8 hours.   BUN 10 6 - 20 mg/dL   Creatinine, Ser 6.04 0.44 - 1.00 mg/dL   Calcium 9.5 8.9 - 54.0 mg/dL   Total Protein 8.0 6.5 - 8.1 g/dL   Albumin 4.1 3.5 - 5.0 g/dL   AST 17 15 - 41 U/L   ALT 16 0 - 44 U/L   Alkaline Phosphatase 89 38 - 126 U/L   Total Bilirubin 0.5 0.3 - 1.2 mg/dL   GFR, Estimated >98 >11 mL/min    Comment: (NOTE) Calculated using the CKD-EPI Creatinine Equation (2021)    Anion gap 13 5 - 15    Comment: Performed at [facility], [address] [place], [place] [address]  CBG monitoring, ED     Status: Abnormal   Collection Time: 04/11/23  4:13 PM  Result Value Ref Range   Glucose-Capillary 195 (H) 70 - 99 mg/dL    Comment: Glucose reference range applies only to samples taken after fasting for at least 8 hours.  I-Stat beta hCG blood, ED     Status: None   Collection Time: 04/11/23  4:17 PM  Result Value Ref Range   I-stat hCG, quantitative <5.0 <5 mIU/mL   Comment 3            Comment:   GEST. AGE      CONC.  (mIU/mL)   <=1 WEEK        5 - 50     2 WEEKS       50 - 500     3 WEEKS       100 - 10,000     4 WEEKS     1,000 - 30,000        FEMALE AND NON-PREGNANT FEMALE:     LESS THAN 5 mIU/mL   I-stat chem 8, ED     Status: Abnormal   Collection Time: 04/11/23  4:18 PM   Result Value Ref Range   Sodium 135 135 - 145 mmol/L   Potassium 3.7 3.5 - 5.1 mmol/L   Chloride 97 (L) 98 - 111 mmol/L   BUN 11 6 - 20 mg/dL   Creatinine, Ser 2.95 0.44 - 1.00 mg/dL   Glucose, Bld 621 (H) 70 - 99 mg/dL    Comment: Glucose reference range applies only to samples taken after fasting for at least 8 hours.   Calcium, Ion 1.17 1.15 - 1.40 mmol/L   TCO2 28 22 - 32 mmol/L   Hemoglobin 15.3 (H) 12.0 - 15.0 g/dL   HCT 30.8 65.7 - 84.6 %  CBC with Differential/Platelet  Status: None   Collection Time: 04/12/23  1:00 AM  Result Value Ref Range   WBC 6.6 4.0 - 10.5 K/uL   RBC 5.05 3.87 - 5.11 MIL/uL   Hemoglobin 13.5 12.0 - 15.0 g/dL   HCT 16.1 09.6 - 04.5 %   MCV 81.4 80.0 - 100.0 fL   MCH 26.7 26.0 - 34.0 pg   MCHC 32.8 30.0 - 36.0 g/dL   RDW 40.9 81.1 - 91.4 %   Platelets 378 150 - 400 K/uL   nRBC 0.0 0.0 - 0.2 %   Neutrophils Relative % 50 %   Neutro Abs 3.4 1.7 - 7.7 K/uL   Lymphocytes Relative 40 %   Lymphs Abs 2.6 0.7 - 4.0 K/uL   Monocytes Relative 6 %   Monocytes Absolute 0.4 0.1 - 1.0 K/uL   Eosinophils Relative 2 %   Eosinophils Absolute 0.1 0.0 - 0.5 K/uL   Basophils Relative 1 %   Basophils Absolute 0.0 0.0 - 0.1 K/uL   Immature Granulocytes 1 %   Abs Immature Granulocytes 0.03 0.00 - 0.07 K/uL    Comment: Performed at [facility], [address] [place], [place] [address]  Comprehensive metabolic panel     Status: Abnormal   Collection Time: 04/12/23  1:00 AM  Result Value Ref Range   Sodium 131 (L) 135 - 145 mmol/L   Potassium 3.7 3.5 - 5.1 mmol/L   Chloride 96 (L) 98 - 111 mmol/L   CO2 27 22 - 32 mmol/L   Glucose, Bld 271 (H) 70 - 99 mg/dL    Comment: Glucose reference range applies only to samples taken after fasting for at least 8 hours.   BUN 11 6 - 20 mg/dL   Creatinine, Ser 6.21 0.44 - 1.00 mg/dL   Calcium 9.0 8.9 - 30.8 mg/dL   Total Protein 7.2 6.5 - 8.1 g/dL   Albumin 3.5 3.5 - 5.0 g/dL   AST 17 15 - 41 U/L   ALT 15 0 -  44 U/L   Alkaline Phosphatase 80 38 - 126 U/L   Total Bilirubin 0.5 0.3 - 1.2 mg/dL   GFR, Estimated >65 >78 mL/min    Comment: (NOTE) Calculated using the CKD-EPI Creatinine Equation (2021)    Anion gap 8 5 - 15    Comment: Performed at [facility] Lab, [address] [place], [place] [address]  Magnesium     Status: None   Collection Time: 04/12/23  1:00 AM  Result Value Ref Range   Magnesium 1.8 1.7 - 2.4 mg/dL    Comment: Performed at [facility], [address] [place], [place] [address]  Lipid panel     Status: Abnormal   Collection Time: 04/12/23  1:00 AM  Result Value Ref Range   Cholesterol 187 0 - 200 mg/dL   Triglycerides 132 (H) <150 mg/dL   HDL 47 >44 mg/dL   Total CHOL/HDL Ratio 4.0 RATIO   VLDL 48 (H) 0 - 40 mg/dL   LDL Cholesterol 92 0 - 99 mg/dL    Comment:        Total Cholesterol/HDL:CHD Risk Coronary Heart Disease Risk Table                     Men   Women  1/2 Average Risk   3.4   3.3  Average Risk       5.0   4.4  2 X Average Risk   9.6  7.1  3 X Average Risk  23.4   11.0        Use the calculated Patient Ratio above and the CHD Risk Table to determine the patient's CHD Risk.        ATP III CLASSIFICATION (LDL):  <100     mg/dL   Optimal  098-119  mg/dL   Near or Above                    Optimal  130-159  mg/dL   Borderline  147-829  mg/dL   High  >562     mg/dL   Very High Performed at [facility], [address], [place], [place] [address]   CBG monitoring, ED     Status: Abnormal   Collection Time: 04/12/23  7:44 AM  Result Value Ref Range   Glucose-Capillary 382 (H) 70 - 99 mg/dL    Comment: Glucose reference range applies only to samples taken after fasting for at least 8 hours.   Comment 1 Notify RN    Comment 2 Document in Chart    MR BRAIN WO CONTRAST  Result Date: 04/11/2023 CLINICAL DATA:  Acute neurologic deficit EXAM: MRI HEAD WITHOUT CONTRAST TECHNIQUE: Multiplanar, multiecho pulse sequences of the brain and  surrounding structures were obtained without intravenous contrast. COMPARISON:  None Available. 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 No acute hemorrhage. No chronic microhemorrhage. Normal white matter signal, parenchymal volume and CSF spaces. The midline structures are normal. Vascular: Major flow voids are preserved. Skull and upper cervical spine: Normal calvarium and skull base. Visualized upper cervical spine and soft tissues are normal. Sinuses/Orbits:No paranasal sinus fluid levels or advanced mucosal thickening. No mastoid or middle ear effusion. Normal orbits. 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 No acute hemorrhage or mass effect. Electronically Signed   By: Deatra Robinson M.D.   On: 04/11/2023 20:22   CT HEAD WO CONTRAST  Result Date: 04/11/2023 CLINICAL DATA:  Right leg and arm weakness, slurred speech, facial droop EXAM: CT HEAD WITHOUT CONTRAST TECHNIQUE: Contiguous axial images were obtained from the base of the skull through the vertex without intravenous contrast. RADIATION DOSE REDUCTION: This exam was performed according to the departmental dose-optimization program which includes automated exposure control, adjustment of the mA and/or kV according to patient size and/or use of iterative reconstruction technique. COMPARISON:  04/09/2023 CT head FINDINGS: Brain: Increased hypodensity in the left pons (series 3, image 11-12), which in retrospect was quite subtle but likely present on the 04/09/2023 CT. No acute hemorrhage, mass, mass effect, or midline shift. No hydrocephalus or extra-axial collection. Vascular: No hyperdense vessel. Skull: Negative for fracture or focal lesion. Sinuses/Orbits: No acute finding. Status post bilateral lens replacements. Other: The mastoid air cells are well aerated. IMPRESSION: 1. Increased hypodensity in the left pons, favored to represent an acute or early subacute infarct. An MRI head is recommended for further  evaluation. 2. No acute hemorrhage. These results were called by telephone at the time of interpretation on 04/11/2023 at 5:41 pm to provider FORD, who verbally acknowledged these results. Electronically Signed   By: Wiliam Ke M.D.   On: 04/11/2023 17:44    Pending Labs Unresulted Labs (From admission, onward)     Start     Ordered   04/11/23 2127  Hemoglobin A1c  (Labs)  Add-on,   AD       Comments: To assess prior glycemic control    04/11/23  2126   04/11/23 2125  Magnesium  Add-on,   AD        04/11/23 2124            Vitals/Pain Today's Vitals   04/12/23 0343 04/12/23 0400 04/12/23 0615 04/12/23 0725  BP:   (!) 161/90 (!) 161/97  Pulse:  64 78 78  Resp:  18 18 18   Temp: 98 F (36.7 C)   97.7 F (36.5 C)  TempSrc:    Axillary  SpO2:  100% 99% 100%  Weight:      Height:      PainSc:    2     Isolation Precautions No active isolations  Medications Medications  acetaminophen (TYLENOL) tablet 650 mg (650 mg Oral Given 04/12/23 0050)    Or  acetaminophen (TYLENOL) suppository 650 mg ( Rectal See Alternative 04/12/23 0050)  melatonin tablet 3 mg (has no administration in time range)  ondansetron (ZOFRAN) injection 4 mg (has no administration in time range)   stroke: early stages of recovery book (has no administration in time range)  insulin aspart (novoLOG) injection 0-9 Units (9 Units Subcutaneous Given 04/12/23 0817)  labetalol (NORMODYNE) injection 10 mg (has no administration in time range)  bictegravir-emtricitabine-tenofovir AF (BIKTARVY) 50-200-25 MG per tablet 1 tablet (has no administration in time range)  mometasone-formoterol (DULERA) 200-5 MCG/ACT inhaler 2 puff (has no administration in time range)  loratadine (CLARITIN) tablet 10 mg (has no administration in time range)  escitalopram (LEXAPRO) tablet 20 mg (has no administration in time range)  albuterol (PROVENTIL) (2.5 MG/3ML) 0.083% nebulizer solution 2.5 mg (has no administration in time range)   aspirin EC tablet 81 mg (has no administration in time range)  clopidogrel (PLAVIX) tablet 75 mg (has no administration in time range)  rosuvastatin (CRESTOR) tablet 20 mg (has no administration in time range)  midazolam (VERSED) injection 2 mg (2 mg Intravenous Given 04/11/23 1921)    Mobility walks     Focused Assessments Neuro Assessment Handoff:  Swallow screen pass? Yes    NIH Stroke Scale  Dizziness Present: No Headache Present: No Interval: Shift assessment Level of Consciousness (1a.)   : Alert, keenly responsive LOC Questions (1b. )   : Answers both questions correctly LOC Commands (1c. )   : Performs both tasks correctly Best Gaze (2. )  : Normal Visual (3. )  : No visual loss Facial Palsy (4. )    : Minor paralysis Motor Arm, Left (5a. )   : No drift Motor Arm, Right (5b. ) : Some effort against gravity Motor Leg, Left (6a. )  : No drift Motor Leg, Right (6b. ) : No drift Limb Ataxia (7. ): Absent Sensory (8. )  : Mild-to-moderate sensory loss, patient feels pinprick is less sharp or is dull on the affected side, or there is a loss of superficial pain with pinprick, but patient is aware of being touched Best Language (9. )  : No aphasia Dysarthria (10. ): Mild-to-moderate dysarthria, patient slurs at least some words and, at worst, can be understood with some difficulty Extinction/Inattention (11.)   : No Abnormality Complete NIHSS TOTAL: 5     Neuro Assessment: Within Defined Limits Neuro Checks:   Initial (04/11/23 1603)  Has TPA been given? No If patient is a Neuro Trauma and patient is going to OR before floor call report to 4N Charge nurse: [phone] or [phone]   R Recommendations: See Admitting Provider Note  Report given to:   Additional Notes:  Pt can move herself to a bedside commode and is pretty independent. Weakness to right arm, slight facial droop and difficulty with some words. A&ox4. GCS 15

## 2023-04-12 NOTE — Progress Notes (Signed)
  Transition of Care [facility]) Screening Note   Patient Details  Name: [name] Date of Birth: [birth year]   Transition of Care [facility]) CM/SW Contact:    Baldemar Lenis, LCSW Phone Number: 04/12/2023, 2:56 PM    Transition of Care Department [facility]) has reviewed patient, medical workup ongoing. We will continue to monitor patient advancement through interdisciplinary progression rounds. If new patient transition needs arise, please place a TOC consult.

## 2023-04-12 NOTE — ED Notes (Signed)
Pt does have a slight facial droop, weakness to right arm. Pt does not have slurred speech.

## 2023-04-12 NOTE — ED Notes (Signed)
Pt is resting in bed with eyes closed. Pt is attached to monitor/vitals.

## 2023-04-13 DIAGNOSIS — I639 Cerebral infarction, unspecified: Secondary | ICD-10-CM | POA: Diagnosis not present

## 2023-04-13 DIAGNOSIS — J301 Allergic rhinitis due to pollen: Secondary | ICD-10-CM | POA: Diagnosis not present

## 2023-04-13 DIAGNOSIS — F32A Depression, unspecified: Secondary | ICD-10-CM | POA: Diagnosis not present

## 2023-04-13 DIAGNOSIS — E119 Type 2 diabetes mellitus without complications: Secondary | ICD-10-CM | POA: Diagnosis not present

## 2023-04-13 LAB — GLUCOSE, CAPILLARY
Glucose-Capillary: 301 mg/dL — ABNORMAL HIGH (ref 70–99)
Glucose-Capillary: 361 mg/dL — ABNORMAL HIGH (ref 70–99)
Glucose-Capillary: 288 mg/dL — ABNORMAL HIGH (ref 70–99)
Glucose-Capillary: 301 mg/dL — ABNORMAL HIGH (ref 70–99)

## 2023-04-13 MED ORDER — INSULIN GLARGINE-YFGN 100 UNIT/ML ~~LOC~~ SOLN
10.0000 [IU] | Freq: Every day | SUBCUTANEOUS | Status: DC
  Administered 2023-04-13 – 2023-04-15 (×3): 10 [IU] via SUBCUTANEOUS
  Filled 2023-04-13 (×4): qty 0.1

## 2023-04-13 NOTE — Progress Notes (Signed)
Inpatient Rehab Admissions:  Inpatient Rehab Consult received.  I met with patient at the bedside for rehabilitation assessment and to discuss goals and expectations of an inpatient rehab admission.  Discussed average length of stay, insurance authorization requirement, discharge home after completion of CIR. Pt acknowledged understanding. Pt interested in pursuing CIR. Pt gave permission to contact [facility] Sailors. Spoke with [place] via video chat in pt's room. Antony Blackbird also acknowledged understanding of CIR goals and recommendations. Antony Blackbird confirmed that she will be able to provide 24/7 support for pt after discharge. Will continue to follow.  Signed: Wolfgang Phoenix, MS, CCC-SLP Admissions Coordinator [phone]

## 2023-04-13 NOTE — Evaluation (Addendum)
Occupational Therapy Evaluation Patient Details Name: Latoya Roth MRN: [id number] DOB: January 24, 1971 Today's Date: 04/13/2023   History of Present Illness Patient is a 52 y.o. female who was admitted to [facility] on 04/11/2023 with acute ischemic stroke after presenting from home to [facility] ED complaining of slurred speech. MRI revealed  acute infarct in the left paramedian pons, will demonstrate no evidence of acute hemorrhage or any evidence of mass effect/midline shift. PMH significant for anxiety, depression, DMII, HTN, HIV, moderate persistent asthma.   Clinical Impression   Pt reports PTA, she was independent with ADL/IADL and functional mobility with use of spc. Pt currently presents with decreased strength and coordination in RUE/RLE. Pt reports blurry vision consistent with baseline vision. Upon OT arrival, pt just received breakfast. Pt utilized RUE during bimanual tasks but demonstrated decreased fine motor and gross motor coordination and decreased RUE strength and endurance. Provided pt with level 1 theraputty and fine motor coordination HEP.  Pt declined mobility at this time due to request to eat breakfast. Will continue to follow acutely and progress appropriately. Patient will benefit from intensive inpatient follow up therapy, >3 hours/day        Recommendations for follow up therapy are one component of a multi-disciplinary discharge planning process, led by the attending physician.  Recommendations may be updated based on patient status, additional functional criteria and insurance authorization.   Assistance Recommended at Discharge Intermittent Supervision/Assistance  Patient can return home with the following A little help with walking and/or transfers;A little help with bathing/dressing/bathroom;Assistance with cooking/housework    Functional Status Assessment  Patient has had a recent decline in their functional status and demonstrates the ability to make  significant improvements in function in a reasonable and predictable amount of time.  Equipment Recommendations  BSC/3in1;Tub/shower bench    Recommendations for Other Services       Precautions / Restrictions Precautions Precautions: Fall Restrictions Weight Bearing Restrictions: No      Mobility Bed Mobility               General bed mobility comments: pt sitting in recliner upon arrival    Transfers                   General transfer comment: deferred mobility as pt requesting to eat breakfast      Balance                                           ADL either performed or assessed with clinical judgement   ADL Overall ADL's : Needs assistance/impaired Eating/Feeding: Set up;Sitting Eating/Feeding Details (indicate cue type and reason): pt requesting intermittent assistance with meal setup due to frustration with decreased coordination in RUE Grooming: Set up;Sitting   Upper Body Bathing: Set up;Sitting   Lower Body Bathing: Set up;Sitting/lateral leans   Upper Body Dressing : Min guard;Sitting     Lower Body Dressing Details (indicate cue type and reason): will further assess               General ADL Comments: pt deferred mobility due to request to eat breakfast     Vision Baseline Vision/History: 1 Wears glasses Ability to See in Adequate Light: 1 Impaired Vision Assessment?: Yes Eye Alignment: Within Functional Limits Ocular Range of Motion: Within Functional Limits Alignment/Gaze Preference: Within Defined Limits Tracking/Visual Pursuits: Able to track stimulus in  all quads without difficulty Saccades: Within functional limits Convergence: Within functional limits Additional Comments: pt reports blurry vision at baseline, reports vision is currently at baseline. Pt a bit vague when explaining visual limitations     Perception     Praxis      Pertinent Vitals/Pain Pain Assessment Pain Assessment: No/denies  pain     Hand Dominance Right   Extremity/Trunk Assessment Upper Extremity Assessment Upper Extremity Assessment: RUE deficits/detail RUE Deficits / Details: grossly 3+/5pt reports sensation is intact, but demonstrates decreased coordination and strength. Pt attempting to use hand during bimanual tasks with meals. Provided pt with fine motor coordination HEP and level 1 theraputty and educated on HEP; RUE Coordination: decreased fine motor;decreased gross motor   Lower Extremity Assessment Lower Extremity Assessment: Defer to PT evaluation RLE Deficits / Details: hip flexion 3+/5, knee ext/flex 3+/5, 3-/5 dorsiflexion. pt unable to complete heel to shin 2/2 weakness RLE Sensation: WNL RLE Coordination: decreased gross motor;decreased fine motor LLE Deficits / Details: 4/5 for hip flexion, 4+/5 for knee ext/flex, dorsiflexion/plantar flexoin LLE Sensation: WNL LLE Coordination: WNL   Cervical / Trunk Assessment Cervical / Trunk Assessment: Normal;Other exceptions Cervical / Trunk Exceptions: habitus   Communication Communication Communication: Expressive difficulties (slurred dysarthria)   Cognition Arousal/Alertness: Awake/alert Behavior During Therapy: WFL for tasks assessed/performed Overall Cognitive Status: Within Functional Limits for tasks assessed                                 General Comments: scored 0/28 on short blessed test indicating cognition within normal limits     General Comments       Exercises     Shoulder Instructions      Home Living Family/patient expects to be discharged to:: Private residence Living Arrangements: Alone Available Help at Discharge: Family (son lives in town, has a friend to help if needed) Type of Home: Apartment Home Access: Level entry     Home Layout: One level     Bathroom Shower/Tub: Chief Strategy Officer: Standard Bathroom Accessibility: Yes   Home Equipment: Cane - single point           Prior Functioning/Environment Prior Level of Function : Independent/Modified Independent             Mobility Comments: uses SPC for about 5months, started falling back then          OT Problem List: Decreased strength;Decreased activity tolerance;Decreased coordination;Obesity;Impaired UE functional use      OT Treatment/Interventions: Self-care/ADL training;Therapeutic exercise;Neuromuscular education;Therapeutic activities;Patient/family education;Balance training    OT Goals(Current goals can be found in the care plan section) Acute Rehab OT Goals Patient Stated Goal: to return to baseline OT Goal Formulation: With patient Time For Goal Achievement: 04/27/23 Potential to Achieve Goals: Good ADL Goals Pt Will Perform Grooming: with modified independence;standing Pt Will Perform Lower Body Dressing: with modified independence;sit to/from stand Pt Will Transfer to Toilet: with supervision;ambulating Pt/caregiver will Perform Home Exercise Program: Right Upper extremity;With theraputty;Increased strength;Independently;With written HEP provided  OT Frequency: Min 2X/week    Co-evaluation              AM-PAC OT "6 Clicks" Daily Activity     Outcome Measure Help from another person eating meals?: A Little Help from another person taking care of personal grooming?: A Little Help from another person toileting, which includes using toliet, bedpan, or urinal?: A Lot Help from another  person bathing (including washing, rinsing, drying)?: A Lot Help from another person to put on and taking off regular upper body clothing?: A Little Help from another person to put on and taking off regular lower body clothing?: A Lot 6 Click Score: 15   End of Session Nurse Communication: Mobility status  Activity Tolerance: Patient tolerated treatment well Patient left: in chair;with call bell/phone within reach;with chair alarm set  OT Visit Diagnosis: Other abnormalities of gait  and mobility (R26.89);Muscle weakness (generalized) (M62.81);Hemiplegia and hemiparesis Hemiplegia - Right/Left: Right Hemiplegia - dominant/non-dominant: Dominant Hemiplegia - caused by: Cerebral infarction                Time: 4132-4401 OT Time Calculation (min): 19 min Charges:  OT General Charges $OT Visit: 1 Visit OT Evaluation $OT Eval Moderate Complexity: 1 Mod  Najai Waszak OTR/L Acute Rehabilitation Services Office: [phone]   Rebeca Alert 04/13/2023, 10:19 AM

## 2023-04-13 NOTE — Plan of Care (Signed)
  Problem: Education: Goal: Knowledge of disease or condition will improve Outcome: Progressing   Problem: Ischemic Stroke/TIA Tissue Perfusion: Goal: Complications of ischemic stroke/TIA will be minimized Outcome: Progressing   Problem: Coping: Goal: Will verbalize positive feelings about self Outcome: Progressing   Problem: Health Behavior/Discharge Planning: Goal: Ability to manage health-related needs will improve Outcome: Progressing   Problem: Self-Care: Goal: Ability to participate in self-care as condition permits will improve Outcome: Progressing Goal: Ability to communicate needs accurately will improve Outcome: Progressing   Problem: Nutrition: Goal: Risk of aspiration will decrease Outcome: Progressing Goal: Dietary intake will improve Outcome: Progressing   Problem: Coping: Goal: Ability to adjust to condition or change in health will improve Outcome: Progressing   Problem: Fluid Volume: Goal: Ability to maintain a balanced intake and output will improve Outcome: Progressing

## 2023-04-13 NOTE — Progress Notes (Signed)
PROGRESS NOTE    Latoya Roth  ZOX:[id number] DOB: 12-18-1971 DOA: 04/11/2023 PCP: Latoya Plum, MD    Brief Narrative:  Latoya Roth is a 52 y.o. female with past medical history significant for type 2 diabetes mellitus, essential pretension, HIV, moderate persistent asthma, presented to the to hospital with slurred speech associated t with right-sided facial droop in the ED vitals were relatively stable.  Labs showed sodium slightly low at 135.  Urine drug screen was negative.  Urinalysis was negative as well.  EKG showed normal sinus rhythm.  CT head showed increased hypodensity in the left pons, favored to represent an acute versus early subacute infarct. MRI brain without contrast showed acute infarct in the left paramedian pons. Neurology was consulted and patient was admitted hospital for further evaluation and treatment.  Assessment and plan   Acute ischemic CVA:  Patient presented with dysarthria, right upper extremity weakness and right facial droop.  MRI with left paramedian pons stroke.  Patient was outside the tPA window.  EKG with normal sinus rhythm.   PT OT has seen the patient and plan is rehabilitation at this time. On permissive hypertension,  TTE with  bubble study with LV ejection fraction of 55 to 60% with LVH and diastolic dysfunction. lipid panel with LDL of 92.  Hemoglobin A1c of 8.0, two days back.  Continue telemetry monitoring.  Neurology recommends aspirin Plavix for 3 weeks followed by aspirin alone.    Type 2 Diabetes mellitus:  Patient is on metformin, glipizide on hold..  Additionally, she is on Ozempic as an outpatient.  Continue sliding scale insulin.  Hemoglobin  A1c of 8.0.  Depression.  On Lexapro.   HIV: Latest CD4 count on 03/13/2023 noted to be 848, at which time viral load was found to be undetectable.  Continue Biktarvy   moderate persistent asthma:  Stable at this time.  Continue albuterol and Symbicort.     Allergic Rhinitis:   Continue Zyrtec    DVT prophylaxis: enoxaparin (LOVENOX) injection 40 mg Start: 04/12/23 1915 SCDs Start: 04/11/23 2125   Code Status:     Code Status: Full Code  Disposition: Likely to CIR Status is: Inpatient Remains inpatient appropriate because: Need for rehabilitation, CVA   Family Communication: None at bedside  Consultants:  Neurology  Procedures:  None  Antimicrobials:  Biktarvy  Anti-infectives (From admission, onward)    Start     Dose/Rate Route Frequency Ordered Stop   04/12/23 1000  bictegravir-emtricitabine-tenofovir AF (BIKTARVY) 50-200-25 MG per tablet 1 tablet        1 tablet Oral Daily 04/12/23 0113          Subjective: Today, patient was seen and examined at bedside.  Patient states that she still has some weakness on the right side and some speech difficulty.  Objective: Vitals:   04/13/23 0325 04/13/23 0500 04/13/23 0841 04/13/23 0842  BP: (!) 140/73   (!) 138/91  Pulse: 69  89 70  Resp: 19  20 18   Temp: 97.6 F (36.4 C)   98.4 F (36.9 C)  TempSrc: Oral   Oral  SpO2: 94%  96% 90%  Weight:  113.7 kg    Height:        Intake/Output Summary (Last 24 hours) at 04/13/2023 1124 Last data filed at 04/13/2023 1010 Gross per 24 hour  Intake 120 ml  Output 1050 ml  Net -930 ml   Filed Weights   04/11/23 1558 04/12/23 1034 04/13/23 0500  Weight: 113.4  kg 113.9 kg 113.7 kg    Physical Examination: Body mass index is 39.26 kg/m.   General: Obese built, not in obvious distress HENT:   No scleral pallor or icterus noted. Oral mucosa is moist.  Chest:   Diminished breath sounds bilaterally. No crackles or wheezes.  CVS: S1 &S2 heard. No murmur.  Regular rate and rhythm. Abdomen: Soft, nontender, nondistended.  Bowel sounds are heard.   Extremities: No cyanosis, clubbing or edema.  Peripheral pulses are palpable. Psych: Alert, awake and oriented, normal mood CNS: Right facial droop.  Right-sided weakness noted, mild dysarthria. Skin:  Warm and dry.  No rashes noted.  Data Reviewed:   CBC: Recent Labs  Lab 04/11/23 1606 04/11/23 1618 04/12/23 0100  WBC 7.7  --  6.6  NEUTROABS 4.0  --  3.4  HGB 13.9 15.3* 13.5  HCT 42.2 45.0 41.1  MCV 80.8  --  81.4  PLT 414*  --  378    Basic Metabolic Panel: Recent Labs  Lab 04/11/23 1606 04/11/23 1618 04/12/23 0100  NA 135 135 131*  K 3.6 3.7 3.7  CL 95* 97* 96*  CO2 27  --  27  GLUCOSE 197* 202* 271*  BUN CREATININE 0.81 0.70 0.95  CALCIUM 9.5  --  9.0  MG  --   --  1.8  1.8    Liver Function Tests: Recent Labs  Lab 04/11/23 1606 04/12/23 0100  AST 17 17  ALT 16 15  ALKPHOS 89 80  BILITOT 0.5 0.5  PROT 8.0 7.2  ALBUMIN 4.1 3.5     Radiology Studies: ECHOCARDIOGRAM COMPLETE BUBBLE STUDY  Result Date: 04/12/2023    ECHOCARDIOGRAM REPORT   Patient Name:   Latoya Roth Date of Exam: 04/12/2023 Medical Rec #:  [id number]           Height:       67.0 in Accession #:    [id number]          Weight:       250.0 lb Date of Birth:  08/21/1971           BSA:          2.223 m Patient Age:    51 years            BP:           145/88 mmHg Patient Gender: F                   HR:           70 bpm. Exam Location:  Inpatient Procedure: 2D Echo, Cardiac Doppler, Color Doppler and Saline Contrast Bubble            Study Indications:    stroke  History:        Patient has no prior history of Echocardiogram examinations.                 Risk Factors:Hypertension and Diabetes.  Sonographer:    Mike Gip Referring Phys: [id number] JUSTIN B HOWERTER IMPRESSIONS  1. Left ventricular ejection fraction, by estimation, is 55 to 60%. The left ventricle has normal function. The left ventricle has no regional wall motion abnormalities. There is mild concentric left ventricular hypertrophy. Left ventricular diastolic parameters were normal.  2. Right ventricular systolic function is normal. The right ventricular size is normal.  3. The mitral valve is normal in structure.  Trivial mitral valve regurgitation. No evidence of mitral  stenosis.  4. The aortic valve is normal in structure. Aortic valve regurgitation is not visualized. No aortic stenosis is present.  5. Agitated saline contrast bubble study was negative, with no evidence of any interatrial shunt. FINDINGS  Left Ventricle: Left ventricular ejection fraction, by estimation, is 55 to 60%. The left ventricle has normal function. The left ventricle has no regional wall motion abnormalities. The left ventricular internal cavity size was normal in size. There is  mild concentric left ventricular hypertrophy. Left ventricular diastolic parameters were normal. Right Ventricle: The right ventricular size is normal. No increase in right ventricular wall thickness. Right ventricular systolic function is normal. Left Atrium: Left atrial size was normal in size. Right Atrium: Right atrial size was normal in size. Pericardium: There is no evidence of pericardial effusion. Mitral Valve: The mitral valve is normal in structure. Trivial mitral valve regurgitation. No evidence of mitral valve stenosis. Tricuspid Valve: The tricuspid valve is normal in structure. Tricuspid valve regurgitation is trivial. No evidence of tricuspid stenosis. Aortic Valve: The aortic valve is normal in structure. Aortic valve regurgitation is not visualized. No aortic stenosis is present. Pulmonic Valve: The pulmonic valve was normal in structure. Pulmonic valve regurgitation is not visualized. No evidence of pulmonic stenosis. Aorta: The aortic root is normal in size and structure. Venous: The inferior vena cava was not well visualized. IAS/Shunts: No atrial level shunt detected by color flow Doppler. Agitated saline contrast was given intravenously to evaluate for intracardiac shunting. Agitated saline contrast bubble study was negative, with no evidence of any interatrial shunt.  LEFT VENTRICLE PLAX 2D LVIDd:         4.00 cm      Diastology LVIDs:         2.40  cm      LV e' medial:    8.92 cm/s LV PW:         1.30 cm      LV E/e' medial:  7.8 LV IVS:        1.20 cm      LV e' lateral:   13.40 cm/s LVOT diam:     1.80 cm      LV E/e' lateral: 5.2 LV SV:         58 LV SV Index:   26 LVOT Area:     2.54 cm  LV Volumes (MOD) LV vol d, MOD A2C: 134.0 ml LV vol d, MOD A4C: 149.0 ml LV vol s, MOD A2C: 59.2 ml LV vol s, MOD A4C: 67.7 ml LV SV MOD A2C:     74.8 ml LV SV MOD A4C:     149.0 ml LV SV MOD BP:      78.9 ml RIGHT VENTRICLE RV Basal diam:  3.70 cm RV S prime:     11.40 cm/s TAPSE (M-mode): 2.0 cm LEFT ATRIUM             Index        RIGHT ATRIUM           Index LA diam:        3.10 cm 1.39 cm/m   RA Area:     11.60 cm LA Vol (A2C):   50.1 ml 22.53 ml/m  RA Volume:   25.30 ml  11.38 ml/m LA Vol (A4C):   57.6 ml 25.91 ml/m LA Biplane Vol: 55.8 ml 25.10 ml/m  AORTIC VALVE LVOT Vmax:   107.00 cm/s LVOT Vmean:  73.100 cm/s LVOT VTI:    0.228  m  AORTA Ao Root diam: 3.10 cm Ao Asc diam:  3.30 cm MITRAL VALVE MV Area (PHT): 2.55 cm    SHUNTS MV Decel Time: 297 msec    Systemic VTI:  0.23 m MV E velocity: 69.70 cm/s  Systemic Diam: 1.80 cm MV A velocity: 76.50 cm/s MV E/A ratio:  0.91 Arvilla Meres MD Electronically signed by Arvilla Meres MD Signature Date/Time: 04/12/2023/2:31:08 PM    Final    CT ANGIO HEAD NECK W WO CM  Result Date: 04/12/2023 CLINICAL DATA:  Stroke/TIA, determine embolic source EXAM: CT ANGIOGRAPHY HEAD AND NECK WITH AND WITHOUT CONTRAST TECHNIQUE: Multidetector CT imaging of the head and neck was performed using the standard protocol during bolus administration of intravenous contrast. Multiplanar CT image reconstructions and MIPs were obtained to evaluate the vascular anatomy. Carotid stenosis measurements (when applicable) are obtained utilizing NASCET criteria, using the distal internal carotid diameter as the denominator. RADIATION DOSE REDUCTION: This exam was performed according to the departmental dose-optimization program which  includes automated exposure control, adjustment of the mA and/or kV according to patient size and/or use of iterative reconstruction technique. CONTRAST:  75mL OMNIPAQUE IOHEXOL 350 MG/ML SOLN COMPARISON:  None Available. FINDINGS: CT HEAD FINDINGS Brain: Known pontine infarct better characterized on recent MRI. No evidence of acute hemorrhage, mass lesion, midline shift or hydrocephalus. Vascular: See below. Skull: No acute fracture. Sinuses/Orbits: Clear sinuses.  No acute orbital findings. Other: No mastoid effusions Review of the MIP images confirms the above findings CTA NECK FINDINGS Aortic arch: Great vessel origins are patent without significant stenosis. Right carotid system: No evidence of dissection, stenosis (50% or greater), or occlusion. Left carotid system: No evidence of dissection, stenosis (50% or greater), or occlusion. Vertebral arteries: Codominant. No evidence of dissection, stenosis (50% or greater), or occlusion. Skeleton: Negative. Other neck: Negative. Upper chest: Visualized lung apices are clear. Review of the MIP images confirms the above findings CTA HEAD FINDINGS Anterior circulation: Bilateral intracranial ICAs, MCAs, and ACAs are patent without proximally image the significant stenosis. Posterior circulation: Bilateral intradural vertebral arteries, basilar artery and bilateral posterior cerebral arteries are patent without proximal hemodynamically significant stenosis. Venous sinuses: As permitted by contrast timing, patent. Review of the MIP images confirms the above findings IMPRESSION: No large vessel occlusion or proximal hemodynamically significant stenosis. Electronically Signed   By: Feliberto Harts M.D.   On: 04/12/2023 10:19   MR BRAIN WO CONTRAST  Result Date: 04/11/2023 CLINICAL DATA:  Acute neurologic deficit EXAM: MRI HEAD WITHOUT CONTRAST TECHNIQUE: Multiplanar, multiecho pulse sequences of the brain and surrounding structures were obtained without intravenous  contrast. COMPARISON:  None Available. 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 No acute hemorrhage. No chronic microhemorrhage. Normal white matter signal, parenchymal volume and CSF spaces. The midline structures are normal. Vascular: Major flow voids are preserved. Skull and upper cervical spine: Normal calvarium and skull base. Visualized upper cervical spine and soft tissues are normal. Sinuses/Orbits:No paranasal sinus fluid levels or advanced mucosal thickening. No mastoid or middle ear effusion. Normal orbits. 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 No acute hemorrhage or mass effect. Electronically Signed   By: Deatra Robinson M.D.   On: 04/11/2023 20:22   CT HEAD WO CONTRAST  Result Date: 04/11/2023 CLINICAL DATA:  Right leg and arm weakness, slurred speech, facial droop EXAM: CT HEAD WITHOUT CONTRAST TECHNIQUE: Contiguous axial images were obtained from the base of the skull through the vertex without intravenous contrast. RADIATION DOSE REDUCTION:  This exam was performed according to the departmental dose-optimization program which includes automated exposure control, adjustment of the mA and/or kV according to patient size and/or use of iterative reconstruction technique. COMPARISON:  04/09/2023 CT head FINDINGS: Brain: Increased hypodensity in the left pons (series 3, image 11-12), which in retrospect was quite subtle but likely present on the 04/09/2023 CT. No acute hemorrhage, mass, mass effect, or midline shift. No hydrocephalus or extra-axial collection. Vascular: No hyperdense vessel. Skull: Negative for fracture or focal lesion. Sinuses/Orbits: No acute finding. Status post bilateral lens replacements. Other: The mastoid air cells are well aerated. IMPRESSION: 1. Increased hypodensity in the left pons, favored to represent an acute or early subacute infarct. An MRI head is recommended for further evaluation. 2. No acute hemorrhage. These results were called by  telephone at the time of interpretation on 04/11/2023 at 5:41 pm to provider FORD, who verbally acknowledged these results. Electronically Signed   By: Wiliam Ke M.D.   On: 04/11/2023 17:44      LOS: 1 day    Joycelyn Das, MD [facility] Available via Epic secure chat 7am-7pm After these hours, please refer to coverage provider listed on [email or web address] 04/13/2023, 11:24 AM

## 2023-04-13 NOTE — Plan of Care (Signed)

## 2023-04-13 NOTE — PMR Pre-admission (Signed)
PMR Admission Coordinator Pre-Admission Assessment  Patient: Latoya Roth is an 52 y.o., female MRN: [id number] DOB: 1971/10/22 Height:  (170.2 cm) Weight: 115.3 kg  Insurance Information HMO:     PPO:      PCP:      IPA:      80/20:      OTHER:  PRIMARY: [place] [employer]       Policy#: [id number]      Subscriber: patient CM Name: via fax      Phone#: [phone]     Fax#: [phone] Pre-Cert#: [id number]  approved 4/23 until 04/23/23    Employer:  Benefits:  Phone #: [phone]     Name:  Eff. Date: 11/23/21-12/23/2198     Deduct:       Out of Pocket Max:       Life Max:  CIR: 100% coverage      SNF:  Outpatient:      Co-Pay:  Home Health:       Co-Pay:  DME:      Co-Pay:  Providers: in-network SECONDARY:       Policy#:      Phone#:   Artist:       Phone#:   The Data processing manager" for patients in Inpatient Rehabilitation Facilities with attached "Privacy Act Statement-Health Care Records" was provided and verbally reviewed with: N/A  Emergency Contact Information Contact Information     Name Relation Home Work Mobile   [name] Son [phone]     [name]   [phone]      Current Medical History  Patient Admitting Diagnosis: CVA  History of Present Illness: Pt is a 52 year old female with medical hx significant for: obesity, HIV, DM II, essential HTN, Bell's palsy, former smoker. Pt presented to [facility] on 04/11/23 d/t right-sided weakness and right-sided facial droop that started 4 days prior to presentation. Pt reported presenting to an outside hospital for similar symptoms on 04/09/23 and being discharged. Pt not a candidate for tPA. CT head concerning for pontine infarct. 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 No evidence of hemorrhage or mass effect/midline shift. CTA head and neck unremarkable. 2D Echo showed EF 55-60%.  Therapy evaluations completed and CIR  recommended d/t pt's deficits in functional mobility and inability to complete ADLs independently.   Complete NIHSS TOTAL: 6  Patient's medical record from [facility] has been reviewed by the rehabilitation admission coordinator and physician.  Past Medical History  Past Medical History:  Diagnosis Date   Anxiety    Arthritis    Asthma    Bell's palsy    BV (bacterial vaginosis)    Depression    Diabetes mellitus    HIV disease    Hypertension    Obesity    Has the patient had major surgery during 100 days prior to admission? No  Family History   family history includes Cancer in her son; Hypertension in her mother.  Current Medications  Current Facility-Administered Medications:    acetaminophen (TYLENOL) tablet 650 mg, 650 mg, Oral, Q6H PRN, 650 mg at 04/15/23 1033 **OR** acetaminophen (TYLENOL) suppository 650 mg, 650 mg, Rectal, Q6H PRN, Howerter, Justin B, DO   albuterol (PROVENTIL) (2.5 MG/3ML) 0.083% nebulizer solution 2.5 mg, 2.5 mg, Nebulization, Q4H PRN, Howerter, Justin B, DO   aspirin EC tablet 81 mg, 81 mg, Oral, Daily, Pokhrel, Laxman, MD, 81 mg at 04/16/23 1012   bictegravir-emtricitabine-tenofovir AF (BIKTARVY) 50-200-25 MG per tablet  1 tablet, 1 tablet, Oral, Daily, Howerter, Justin B, DO, 1 tablet at 04/16/23 1012   clopidogrel (PLAVIX) tablet 75 mg, 75 mg, Oral, Daily, Pokhrel, Laxman, MD, 75 mg at 04/16/23 1012   enoxaparin (LOVENOX) injection 40 mg, 40 mg, Subcutaneous, Q24H, Marvel Plan, MD, 40 mg at 04/15/23 2209   insulin aspart (novoLOG) injection 0-20 Units, 0-20 Units, Subcutaneous, TID WC, Hall, Carole N, DO, 7 Units at 04/16/23 0850   insulin aspart (novoLOG) injection 0-5 Units, 0-5 Units, Subcutaneous, QHS, Hall, Carole N, DO, 2 Units at 04/15/23 2207   insulin aspart (novoLOG) injection 5 Units, 5 Units, Subcutaneous, TID WC, Pokhrel, Laxman, MD, 5 Units at 04/16/23 0848   insulin glargine-yfgn (SEMGLEE) injection 12 Units, 12 Units,  Subcutaneous, BID, Pokhrel, Laxman, MD, 12 Units at 04/16/23 1012   labetalol (NORMODYNE) injection 10 mg, 10 mg, Intravenous, Q2H PRN, Howerter, Justin B, DO   lip balm (CARMEX) ointment, , Topical, PRN, Pokhrel, Laxman, MD, Given at 04/13/23 1200   loratadine (CLARITIN) tablet 10 mg, 10 mg, Oral, Daily, Howerter, Justin B, DO, 10 mg at 04/16/23 1012   losartan (COZAAR) tablet 50 mg, 50 mg, Oral, Daily, Marvel Plan, MD, 50 mg at 04/16/23 1012   melatonin tablet 3 mg, 3 mg, Oral, QHS PRN, Howerter, Justin B, DO, 3 mg at 04/15/23 2213   mometasone-formoterol (DULERA) 200-5 MCG/ACT inhaler 2 puff, 2 puff, Inhalation, BID, Howerter, Justin B, DO, 2 puff at 04/16/23 0827   ondansetron (ZOFRAN) injection 4 mg, 4 mg, Intravenous, Q6H PRN, Howerter, Justin B, DO   rosuvastatin (CRESTOR) tablet 20 mg, 20 mg, Oral, Daily, Marvel Plan, MD, 20 mg at 04/16/23 1012  Patients Current Diet:  Diet Order             Diet Carb Modified           Diet Carb Modified Fluid consistency: Thin; Room service appropriate? Yes  Diet effective now                  Precautions / Restrictions Precautions Precautions: Fall Restrictions Weight Bearing Restrictions: No   Has the patient had 2 or more falls or a fall with injury in the past year? No  Prior Activity Level Limited Community (1-2x/wk): MD appointments  Prior Functional Level Self Care: Did the patient need help bathing, dressing, using the toilet or eating? Independent  Indoor Mobility: Did the patient need assistance with walking from room to room (with or without device)? Independent  Stairs: Did the patient need assistance with internal or external stairs (with or without device)? Independent (with supervision)  Functional Cognition: Did the patient need help planning regular tasks such as shopping or remembering to take medications? Independent  Patient Information Are you of Hispanic, Latino/a,or Spanish origin?: A. No, not of Hispanic,  Latino/a, or Spanish origin What is your race?: B. Black or African American Do you need or want an interpreter to communicate with a doctor or health care staff?: 0. No  Patient's Response To:  Health Literacy and Transportation Is the patient able to respond to health literacy and transportation needs?: Yes Health Literacy - How often do you need to have someone help you when you read instructions, pamphlets, or other written material from your doctor or pharmacy?: Never In the past 12 months, has lack of transportation kept you from medical appointments or from getting medications?: No In the past 12 months, has lack of transportation kept you from meetings, work, or from getting things  needed for daily living?: No  Home Assistive Devices / Equipment Home Assistive Devices/Equipment: Cane (specify quad or straight) (quad cane) Home Equipment: Cane - single point  Prior Device Use: Indicate devices/aids used by the patient prior to current illness, exacerbation or injury?  cane  Current Functional Level Cognition  Arousal/Alertness: Awake/alert Overall Cognitive Status: Impaired/Different from baseline Orientation Level: Oriented X4 Safety/Judgement: Decreased awareness of safety General Comments: cues for safety when performing self care at sink Attention: Focused, Sustained Focused Attention: Appears intact Sustained Attention: Impaired Sustained Attention Impairment: Verbal complex Memory: Impaired Memory Impairment:  (Immediate: 5/5; delayed: 4/5; with cues: 1/1) Awareness: Impaired Awareness Impairment: Emergent impairment Problem Solving: Impaired Problem Solving Impairment: Verbal complex (money: 1/3) Executive Function: Sequencing, Occupational hygienist: Impaired Sequencing Impairment: Verbal complex (clock: 2/4) Organizing: Impaired Organizing Impairment: Verbal complex (backward digit span: 1/2)    Extremity Assessment (includes Sensation/Coordination)  Upper  Extremity Assessment: RUE deficits/detail RUE Deficits / Details: grossly 3+/5pt reports sensation is intact, but demonstrates decreased coordination and strength. Pt attempting to use hand during bimanual tasks with meals. Provided pt with fine motor coordination HEP and level 1 theraputty and educated on HEP; RUE Coordination: decreased fine motor, decreased gross motor  Lower Extremity Assessment: Defer to PT evaluation RLE Deficits / Details: hip flexion 3+/5, knee ext/flex 3+/5, 3-/5 dorsiflexion. pt unable to complete heel to shin 2/2 weakness RLE Sensation: WNL RLE Coordination: decreased gross motor, decreased fine motor LLE Deficits / Details: 4/5 for hip flexion, 4+/5 for knee ext/flex, dorsiflexion/plantar flexoin LLE Sensation: WNL LLE Coordination: WNL    ADLs  Overall ADL's : Needs assistance/impaired Eating/Feeding: Set up, Sitting Eating/Feeding Details (indicate cue type and reason): pt requesting intermittent assistance with meal setup due to frustration with decreased coordination in RUE Grooming: Wash/dry hands, Wash/dry face, Oral care, Min guard, Standing Grooming Details (indicate cue type and reason): at sink Upper Body Bathing: Set up, Sitting Upper Body Bathing Details (indicate cue type and reason): at sink Lower Body Bathing: Minimal assistance, Sit to/from stand Lower Body Bathing Details (indicate cue type and reason): able to perform peri care standing at sink with use of sink for support. min assist with bathing feet Upper Body Dressing : Min guard, Sitting Upper Body Dressing Details (indicate cue type and reason): changed gown Lower Body Dressing: Moderate assistance, Sitting/lateral leans Lower Body Dressing Details (indicate cue type and reason): to donn socks General ADL Comments: seated at sink peforming bathing upon entry. Patient attempting to stand with feet on towel, instructed on safety and to wear non skid socks when standing    Mobility   General bed mobility comments: received sitting in recliner    Transfers  Overall transfer level: Needs assistance Equipment used: Rolling walker (2 wheels) Transfers: Sit to/from Stand Sit to Stand: Min guard General transfer comment: min assist to stand from chair at sink and min guard to ambulate to recliner with cues for safety    Ambulation / Gait / Stairs / Wheelchair Mobility  Ambulation/Gait Ambulation/Gait assistance: Mod assist, Min assist, Min guard Gait Distance (Feet): 50 Feet (+ 40' + 20') Assistive device: Rolling walker (2 wheels), Quad cane Gait Pattern/deviations: Step-to pattern, Step-through pattern, Decreased stride length, Decreased dorsiflexion - right, Trunk flexed General Gait Details: slow, guarded gait initially with RW, intermittent minA for stability, cues for sequencing, cues for activity pacing secondary to c/o BLE/BUE fatigue; prolonged standing rest break before additional ambulation. seated rest break then gait trial with quad cane, pt walking ~10'  with LOB requiring modA to prevent fall Gait velocity: Decreased Gait velocity interpretation: <1.31 ft/sec, indicative of household ambulator    Posture / Balance Dynamic Sitting Balance Sitting balance - Comments: able to sit without UE support and perform pericare on toilet, no significant weight shift to do so Balance Overall balance assessment: Needs assistance Sitting-balance support: Feet supported Sitting balance-Leahy Scale: Fair Sitting balance - Comments: able to sit without UE support and perform pericare on toilet, no significant weight shift to do so Standing balance support: Single extremity supported, Bilateral upper extremity supported, During functional activity, No upper extremity supported Standing balance-Leahy Scale: Poor Standing balance comment: LOB standing without UE support    Special needs/care consideration Diabetic management Novolog 5 units 3x daily with meals; Novolog 0-5 units  daily at bedtime; Novolog 0-20 units 3x daily with meals and Bladder incontinence   Previous Home Environment  Living Arrangements: Alone  Lives With: Alone Available Help at Discharge: Family Type of Home: Apartment Home Layout: One level Home Access: Level entry Bathroom Shower/Tub: Engineer, manufacturing systems: Standard Bathroom Accessibility: Yes How Accessible: Accessible via walker Home Care Services: No  Discharge Living Setting Plans for Discharge Living Setting: Patient's home Type of Home at Discharge: Apartment Discharge Home Layout: One level Discharge Home Access: Level entry Discharge Bathroom Shower/Tub: Tub/shower unit Discharge Bathroom Toilet: Standard Discharge Bathroom Accessibility: Yes How Accessible: Accessible via walker Does the patient have any problems obtaining your medications?: No  Social/Family/Support Systems Anticipated Caregiver: Brooks Sailors, pt's godsister Anticipated Caregiver's Contact Information: [phone] Caregiver Availability: 24/7 Discharge Plan Discussed with Primary Caregiver: Yes Is Caregiver In Agreement with Plan?: Yes Does Caregiver/Family have Issues with Lodging/Transportation while Pt is in Rehab?: No  Goals Patient/Family Goal for Rehab: Supervision:PT/OT Expected length of stay: 7-10 days Pt/Family Agrees to Admission and willing to participate: Yes Program Orientation Provided & Reviewed with Pt/Caregiver Including Roles  & Responsibilities: Yes  Decrease burden of Care through IP rehab admission: NA  Possible need for SNF placement upon discharge: Not anticipated  Patient Condition: I have reviewed medical records from [facility], spoken with CM, and patient and godsister. I met with patient at the bedside and discussed via phone for inpatient rehabilitation assessment.  Patient will benefit from ongoing PT and OT, can actively participate in 3 hours of therapy a day 5 days of the week, and can make  measurable gains during the admission.  Patient will also benefit from the coordinated team approach during an Inpatient Acute Rehabilitation admission.  The patient will receive intensive therapy as well as Rehabilitation physician, nursing, social worker, and care management interventions.  Due to bladder management, safety, disease management, medication administration, pain management, and patient education the patient requires 24 hour a day rehabilitation nursing.  The patient is currently min to mod assist overall with mobility and basic ADLs.  Discharge setting and therapy post discharge at home with home health is anticipated.  Patient has agreed to participate in the Acute Inpatient Rehabilitation Program and will admit today.  Preadmission Screen Completed By:  Domingo Pulse, 04/16/2023 10:41 AM ______________________________________________________________________   Discussed status with Dr. Berline Chough on 04/16/23 at 1040 and received approval for admission today.  Admission Coordinator:  Kallee Nam, CCC-SLP, time 1040 Date 04/16/23   Assessment/Plan: Diagnosis: Does the need for close, 24 hr/day Medical supervision in concert with the patient's rehab needs make it unreasonable for this patient to be served in a less intensive setting? Yes Co-Morbidities  requiring supervision/potential complications: DM, A1c 8.0; Bmi 39.81; L pons acute infarct; HTN/HLD, asthma Due to bladder management, bowel management, safety, skin/wound care, disease management, medication administration, pain management, and patient education, does the patient require 24 hr/day rehab nursing? Yes Does the patient require coordinated care of a physician, rehab nurse, PT, OT, and SLP to address physical and functional deficits in the context of the above medical diagnosis(es)? Yes Addressing deficits in the following areas: balance, endurance, locomotion, strength, transferring, bowel/bladder control,  bathing, dressing, feeding, grooming, and toileting Can the patient actively participate in an intensive therapy program of at least 3 hrs of therapy 5 days a week? Yes The potential for patient to make measurable gains while on inpatient rehab is good Anticipated functional outcomes upon discharge from inpatient rehab: supervision PT, supervision OT, n/a SLP Estimated rehab length of stay to reach the above functional goals is: 7-10 days Anticipated discharge destination: Home 10. Overall Rehab/Functional Prognosis: good   MD Signature:

## 2023-04-14 DIAGNOSIS — J301 Allergic rhinitis due to pollen: Secondary | ICD-10-CM | POA: Diagnosis not present

## 2023-04-14 DIAGNOSIS — F32A Depression, unspecified: Secondary | ICD-10-CM | POA: Diagnosis not present

## 2023-04-14 DIAGNOSIS — I10 Essential (primary) hypertension: Secondary | ICD-10-CM | POA: Diagnosis not present

## 2023-04-14 DIAGNOSIS — I639 Cerebral infarction, unspecified: Secondary | ICD-10-CM | POA: Diagnosis not present

## 2023-04-14 LAB — GLUCOSE, CAPILLARY
Glucose-Capillary: 310 mg/dL — ABNORMAL HIGH (ref 70–99)
Glucose-Capillary: 300 mg/dL — ABNORMAL HIGH (ref 70–99)
Glucose-Capillary: 346 mg/dL — ABNORMAL HIGH (ref 70–99)
Glucose-Capillary: 366 mg/dL — ABNORMAL HIGH (ref 70–99)

## 2023-04-14 LAB — BASIC METABOLIC PANEL
Anion gap: 9 (ref 5–15)
BUN: 12 mg/dL (ref 6–20)
CO2: 28 mmol/L (ref 22–32)
Calcium: 8.9 mg/dL (ref 8.9–10.3)
Chloride: 95 mmol/L — ABNORMAL LOW (ref 98–111)
Creatinine, Ser: 0.76 mg/dL (ref 0.44–1.00)
GFR, Estimated: >60 mL/min (ref >60)
Glucose, Bld: 329 mg/dL — ABNORMAL HIGH (ref 70–99)
Potassium: 3.8 mmol/L (ref 3.5–5.1)
Sodium: 132 mmol/L — ABNORMAL LOW (ref 135–145)

## 2023-04-14 LAB — CBC
HCT: 38.2 % (ref 36.0–46.0)
Hemoglobin: 12 g/dL (ref 12.0–15.0)
MCH: 26.1 pg (ref 26.0–34.0)
MCHC: 31.4 g/dL (ref 30.0–36.0)
MCV: 83 fL (ref 80.0–100.0)
Platelets: 336 10*3/uL (ref 150–400)
RBC: 4.6 MIL/uL (ref 3.87–5.11)
RDW: 13.3 % (ref 11.5–15.5)
WBC: 6.2 10*3/uL (ref 4.0–10.5)
nRBC: 0 % (ref 0.0–0.2)

## 2023-04-14 LAB — MAGNESIUM: Magnesium: 1.7 mg/dL (ref 1.7–2.4)

## 2023-04-14 MED ORDER — INSULIN ASPART 100 UNIT/ML IJ SOLN
0.0000 [IU] | Freq: Three times a day (TID) | INTRAMUSCULAR | Status: DC
  Administered 2023-04-15 (×3): 11 [IU] via SUBCUTANEOUS
  Administered 2023-04-16 (×2): 7 [IU] via SUBCUTANEOUS

## 2023-04-14 MED ORDER — INSULIN ASPART 100 UNIT/ML IJ SOLN
0.0000 [IU] | Freq: Every day | INTRAMUSCULAR | Status: DC
  Administered 2023-04-14: 5 [IU] via SUBCUTANEOUS
  Administered 2023-04-15: 2 [IU] via SUBCUTANEOUS

## 2023-04-14 NOTE — Plan of Care (Signed)
  Problem: Education: Goal: Knowledge of disease or condition will improve Outcome: Progressing   Problem: Self-Care: Goal: Ability to communicate needs accurately will improve Outcome: Progressing   Problem: Nutrition: Goal: Risk of aspiration will decrease Outcome: Progressing Goal: Dietary intake will improve Outcome: Progressing   Problem: Health Behavior/Discharge Planning: Goal: Ability to manage health-related needs will improve Outcome: Not Progressing   Problem: Activity: Goal: Risk for activity intolerance will decrease Outcome: Not Progressing

## 2023-04-14 NOTE — Evaluation (Signed)
Speech Language Pathology Evaluation Patient Details Name: Latoya Roth MRN: [id number] DOB: 1971-04-22 Today's Date: 04/14/2023 Time: 4098-1191 SLP Time Calculation (min) (ACUTE ONLY): 20 min  Problem List:  Patient Active Problem List   Diagnosis Date Noted   Depression 04/12/2023   Moderate persistent asthma 04/12/2023   Allergic rhinitis 04/12/2023   Acute ischemic stroke 04/11/2023   Counseled about COVID-19 virus infection 01/25/2020   Screening examination for venereal disease 12/31/2017   Encounter for long-term (current) use of high-risk medication 12/31/2017   Medication monitoring encounter 05/06/2017   DM2 (diabetes mellitus, type 2) 04/04/2017   HIV (human immunodeficiency virus infection) 03/13/2017   Essential hypertension 04/27/2016   Asthma 04/27/2016   BMI 40.0-44.9, adult (HCC) 01/15/2014   Past Medical History:  Past Medical History:  Diagnosis Date   Anxiety    Arthritis    Asthma    Bell's palsy    BV (bacterial vaginosis)    Depression    Diabetes mellitus    HIV disease    Hypertension    Obesity    Past Surgical History:  Past Surgical History:  Procedure Laterality Date   CHOLECYSTECTOMY     HPI:  Pt is a 52 y.o. female who was admitted to [facility] on 04/11/2023 who presented with c/o slurred speech. MRI revealed  acute infarct in the left paramedian pons., PMH: anxiety, depression, DMII, HTN, HIV, moderate persistent asthma.   Assessment / Plan / Recommendation Clinical Impression  Pt participated in speech-language-cognition evaluation. She reported that she has a GED, is currently on disability, lives alone, and was independent with medication and financial management. Pt denied any baseline deficits in speech, language, or cognition. She stated that her speech is now unclear and difficult for individuals to understand. She initially denied any changes in cognition, but stated at the end of the evaluation that her thinking  was "slow" compared to baseline. [facility] Mental Status Examination was completed to evaluate the pt's cognitive-linguistic skills. She achieved a score of 19/30 which is below the normal limits of 27 or more out of 30. She exhibited difficulty in the areas of attention, memory, and executive function as it relates to complex problem solving, mental flexibility, and awareness. She also presented with moderate dysarthria characterized by reduced articulatory precision which negatively impacted speech intelligibility at the sentence and conversational levels. Skilled SLP services are clinically indicated at this time to improve motor speech and cognitive-linguistic function.    SLP Assessment  SLP Recommendation/Assessment: Patient needs continued Speech Lanaguage Pathology Services SLP Visit Diagnosis: Dysarthria and anarthria (R47.1);Cognitive communication deficit (R41.841)    Recommendations for follow up therapy are one component of a multi-disciplinary discharge planning process, led by the attending physician.  Recommendations may be updated based on patient status, additional functional criteria and insurance authorization.    Follow Up Recommendations  Acute inpatient rehab (3hours/day)    Assistance Recommended at Discharge  Intermittent Supervision/Assistance  Functional Status Assessment Patient has had a recent decline in their functional status and demonstrates the ability to make significant improvements in function in a reasonable and predictable amount of time.  Frequency and Duration min 2x/week  2 weeks      SLP Evaluation Cognition  Overall Cognitive Status: Impaired/Different from baseline Arousal/Alertness: Awake/alert Orientation Level: Oriented to person;Oriented to place;Oriented to situation;Disoriented to time Year: 2024 Month: April Day of Week: Correct Attention: Focused;Sustained Focused Attention: Appears intact Sustained Attention:  Impaired Sustained Attention Impairment: Verbal complex Memory:  Impaired Memory Impairment:  (Immediate: 5/5; delayed: 4/5; with cues: 1/1) Awareness: Impaired Awareness Impairment: Emergent impairment Problem Solving: Impaired Problem Solving Impairment: Verbal complex (money: 1/3) Executive Function: Sequencing;Organizing Sequencing: Impaired Sequencing Impairment: Verbal complex (clock: 2/4) Organizing: Impaired Organizing Impairment: Verbal complex (backward digit span: 1/2)       Comprehension  Auditory Comprehension Overall Auditory Comprehension: Appears within functional limits for tasks assessed Yes/No Questions: Within Functional Limits Commands: Within Functional Limits Conversation: Complex    Expression Expression Primary Mode of Expression: Verbal Verbal Expression Overall Verbal Expression: Appears within functional limits for tasks assessed Initiation: No impairment Level of Generative/Spontaneous Verbalization: Conversation Repetition: No impairment Naming: No impairment Pragmatics: No impairment   Oral / Motor  Oral Motor/Sensory Function Overall Oral Motor/Sensory Function: Mild impairment Facial ROM: Reduced right;Suspected CN VII (facial) dysfunction Facial Symmetry: Abnormal symmetry right;Suspected CN VII (facial) dysfunction Facial Strength: Reduced left;Suspected CN VII (facial) dysfunction Facial Sensation: Within Functional Limits Lingual ROM: Reduced right;Reduced left;Suspected CN XII (hypoglossal) dysfunction Motor Speech Overall Motor Speech: Impaired Respiration: Within functional limits Phonation: Normal Resonance: Within functional limits Articulation: Impaired Level of Impairment: Sentence Intelligibility: Intelligibility reduced Word: 75-100% accurate Phrase: 75-100% accurate Sentence: 75-100% accurate Conversation: 75-100% accurate Motor Planning: Witnin functional limits Motor Speech Errors: Aware;Consistent           Theodosia Bahena  I. Vear Clock, MS, CCC-SLP Acute Rehabilitation Services Office number [phone]  Scheryl Marten 04/14/2023, 4:26 PM

## 2023-04-14 NOTE — Progress Notes (Signed)
PROGRESS NOTE    LAELIA ANGELO  ZOX:[id number] DOB: February 11, 1971 DOA: 04/11/2023 PCP: Jackie Plum, MD    Brief Narrative:  WM FRUCHTER is a 52 y.o. female with past medical history significant for type 2 diabetes mellitus, essential pretension, HIV, moderate persistent asthma, presented to the to hospital with slurred speech associated t with right-sided facial droop in the ED vitals were relatively stable.  Labs showed sodium slightly low at 135.  Urine drug screen was negative.  Urinalysis was negative as well.  EKG showed normal sinus rhythm.  CT head showed increased hypodensity in the left pons, favored to represent an acute versus early subacute infarct. MRI brain without contrast showed acute infarct in the left paramedian pons. Neurology was consulted and patient was admitted hospital for further evaluation and treatment.  Assessment and plan   Acute ischemic CVA:  Patient presented with dysarthria, right upper extremity weakness and right facial droop.  MRI with left paramedian pons stroke.  Patient was outside the tPA window.  EKG with normal sinus rhythm.   PT OT has seen the patient and plan is rehabilitation at this time.   TTE with  bubble study with LV ejection fraction of 55 to 60% with LVH and diastolic dysfunction. lipid panel with LDL of 92.  Hemoglobin A1c of 8.0, two days back.  Continue telemetry monitoring.  Neurology recommends aspirin Plavix for 3 weeks followed by aspirin alone.    Type 2 Diabetes mellitus:  Patient is on metformin, glipizide on hold..  Additionally, she is on Ozempic as an outpatient.  Continue sliding scale insulin.  Hemoglobin  A1c of 8.0.  Depression.  On Lexapro.   HIV: Latest CD4 count on 03/13/2023 noted to be 848, at which time viral load was found to be undetectable.  Continue Biktarvy   moderate persistent asthma:  Stable at this time.  Continue albuterol and Symbicort.     Allergic Rhinitis:  Continue Zyrtec    Obesity.Body mass index is 39.71 kg/m.  Patient would benefit from weight loss as outpatient.   DVT prophylaxis: enoxaparin (LOVENOX) injection 40 mg Start: 04/12/23 1915 SCDs Start: 04/11/23 2125   Code Status:     Code Status: Full Code  Disposition: Likely to CIR when bed available.  Medically stable for disposition.  Status is: Inpatient  Remains inpatient appropriate because: Need for rehabilitation, CVA   Family Communication: None at bedside  Consultants:  Neurology  Procedures:  None  Antimicrobials:  Biktarvy  Anti-infectives (From admission, onward)    Start     Dose/Rate Route Frequency Ordered Stop   04/12/23 1000  bictegravir-emtricitabine-tenofovir AF (BIKTARVY) 50-200-25 MG per tablet 1 tablet        1 tablet Oral Daily 04/12/23 0113          Subjective: Today, patient was seen and examined.  Patient complains of weakness on the right side with intermittent speech difficulty.  Able to raise her shoulder overhead today.  Objective: Vitals:   04/14/23 0500 04/14/23 0730 04/14/23 0748 04/14/23 1158  BP:  (!) 152/91  (!) 168/88  Pulse:  70 64 66  Resp:  Temp:  98 F (36.7 C)  98.8 F (37.1 C)  TempSrc:  Oral  Oral  SpO2:  100% 96% 96%  Weight: 115 kg     Height:        Intake/Output Summary (Last 24 hours) at 04/14/2023 1538 Last data filed at 04/14/2023 1229 Gross per 24 hour  Intake 240 ml  Output 1800 ml  Net -1560 ml    Filed Weights   04/12/23 1034 04/13/23 0500 04/14/23 0500  Weight: 113.9 kg 113.7 kg 115 kg    Physical Examination: Body mass index is 39.71 kg/m.   General: Alert awake and Communicative, obese built, not in distress.   HENT:   No scleral pallor or icterus noted. Oral mucosa is moist.  Chest:   Diminished breath sounds bilaterally. No crackles or wheezes.  CVS: S1 &S2 heard. No murmur.  Regular rate and rhythm. Abdomen: Soft, nontender, nondistended.  Bowel sounds are heard.   Extremities: No  cyanosis, clubbing or edema.  Peripheral pulses are palpable. Psych: Alert, awake and oriented, normal mood CNS: Right facial droop.  Right-sided weakness, mild dysarthria. Skin: Warm and dry.  No rashes noted.  Data Reviewed:   CBC: Recent Labs  Lab 04/11/23 1606 04/11/23 1618 04/12/23 0100 04/14/23 0304  WBC 7.7  --  6.6 6.2  NEUTROABS 4.0  --  3.4  --   HGB 13.9 15.3* 13.5 12.0  HCT 42.2 45.0 41.1 38.2  MCV 80.8  --  81.4 83.0  PLT 414*  --  378 336     Basic Metabolic Panel: Recent Labs  Lab 04/11/23 1606 04/11/23 1618 04/12/23 0100 04/14/23 0304  NA 135 135 131* 132*  K 3.6 3.7 3.7 3.8  CL 95* 97* 96* 95*  CO2 27  --  27 28  GLUCOSE 197* 202* 271* 329*  BUN CREATININE 0.81 0.70 0.95 0.76  CALCIUM 9.5  --  9.0 8.9  MG  --   --  1.8  1.8 1.7     Liver Function Tests: Recent Labs  Lab 04/11/23 1606 04/12/23 0100  AST 17 17  ALT 16 15  ALKPHOS 89 80  BILITOT 0.5 0.5  PROT 8.0 7.2  ALBUMIN 4.1 3.5      Radiology Studies: No results found.    LOS: 2 days    Joycelyn Das, MD [facility] Available via Epic secure chat 7am-7pm After these hours, please refer to coverage provider listed on [email or web address] 04/14/2023, 3:38 PM

## 2023-04-14 NOTE — Plan of Care (Signed)
  Problem: Education: Goal: Knowledge of disease or condition will improve 04/14/2023 0639 by Crisoforo Oxford, RN Outcome: Progressing 04/14/2023 0527 by Crisoforo Oxford, RN Outcome: Progressing   Problem: Coping: Goal: Will verbalize positive feelings about self Outcome: Progressing Goal: Will identify appropriate support needs Outcome: Progressing   Problem: Health Behavior/Discharge Planning: Goal: Ability to manage health-related needs will improve Outcome: Progressing Goal: Goals will be collaboratively established with patient/family Outcome: Progressing   Problem: Self-Care: Goal: Ability to participate in self-care as condition permits will improve 04/14/2023 0639 by Crisoforo Oxford, RN Outcome: Progressing 04/14/2023 0527 by Crisoforo Oxford, RN Outcome: Not Progressing Goal: Verbalization of feelings and concerns over difficulty with self-care will improve 04/14/2023 0639 by Crisoforo Oxford, RN Outcome: Progressing 04/14/2023 0527 by Crisoforo Oxford, RN Outcome: Progressing Goal: Ability to communicate needs accurately will improve 04/14/2023 0639 by Crisoforo Oxford, RN Outcome: Progressing 04/14/2023 0527 by Crisoforo Oxford, RN Outcome: Progressing   Problem: Nutrition: Goal: Risk of aspiration will decrease 04/14/2023 0639 by Crisoforo Oxford, RN Outcome: Progressing 04/14/2023 0527 by Crisoforo Oxford, RN Outcome: Progressing   Problem: Coping: Goal: Ability to adjust to condition or change in health will improve Outcome: Progressing   Problem: Health Behavior/Discharge Planning: Goal: Ability to manage health-related needs will improve Outcome: Progressing   Problem: Skin Integrity: Goal: Risk for impaired skin integrity will decrease Outcome: Progressing   Problem: Clinical Measurements: Goal: Will remain free from infection Outcome: Progressing

## 2023-04-14 NOTE — Progress Notes (Signed)
Patient's CBG was 366 mg/dL. Dr. Margo Aye notified with anew order for sliding scale to cover night shift.

## 2023-04-15 DIAGNOSIS — I639 Cerebral infarction, unspecified: Secondary | ICD-10-CM | POA: Diagnosis not present

## 2023-04-15 DIAGNOSIS — J301 Allergic rhinitis due to pollen: Secondary | ICD-10-CM | POA: Diagnosis not present

## 2023-04-15 DIAGNOSIS — F32A Depression, unspecified: Secondary | ICD-10-CM | POA: Diagnosis not present

## 2023-04-15 DIAGNOSIS — E119 Type 2 diabetes mellitus without complications: Secondary | ICD-10-CM | POA: Diagnosis not present

## 2023-04-15 LAB — GLUCOSE, CAPILLARY
Glucose-Capillary: 325 mg/dL — ABNORMAL HIGH (ref 70–99)
Glucose-Capillary: 284 mg/dL — ABNORMAL HIGH (ref 70–99)
Glucose-Capillary: 255 mg/dL — ABNORMAL HIGH (ref 70–99)
Glucose-Capillary: 274 mg/dL — ABNORMAL HIGH (ref 70–99)
Glucose-Capillary: 209 mg/dL — ABNORMAL HIGH (ref 70–99)

## 2023-04-15 MED ORDER — INSULIN GLARGINE-YFGN 100 UNIT/ML ~~LOC~~ SOLN
12.0000 [IU] | Freq: Two times a day (BID) | SUBCUTANEOUS | Status: DC
  Administered 2023-04-15 – 2023-04-16 (×2): 12 [IU] via SUBCUTANEOUS
  Filled 2023-04-15 (×3): qty 0.12

## 2023-04-15 MED ORDER — INSULIN ASPART 100 UNIT/ML IJ SOLN
3.0000 [IU] | Freq: Three times a day (TID) | INTRAMUSCULAR | Status: DC
  Administered 2023-04-15: 3 [IU] via SUBCUTANEOUS

## 2023-04-15 MED ORDER — INSULIN ASPART 100 UNIT/ML IJ SOLN
5.0000 [IU] | Freq: Three times a day (TID) | INTRAMUSCULAR | Status: DC
  Administered 2023-04-15 – 2023-04-16 (×4): 5 [IU] via SUBCUTANEOUS

## 2023-04-15 NOTE — Progress Notes (Signed)
Physical Therapy Treatment Patient Details Name: Latoya Roth MRN: [id number] DOB: 11/09/1971 Today's Date: 04/15/2023   History of Present Illness Pt is a 52 y.o. female admitted  04/11/2023 with slurred speech. MRI revealed acute infarct L paramedian pons; no evidence of acute hemorrhage or mass effect/midline shift. PMH includes anxiety, depression, DMII, HTN, HIV, asthma.   PT Comments    Pt progressing with mobility. Today's session focused on transfer and gait training with RW; pt performed gait trial with quad cane, which she uses baseline, requiring modA to prevent LOB. Pt remains limited by generalized weakness, decreased activity tolerance, and impaired balance strategies/postural reactions. Pt extremely motivated to participate to regain mod indep PLOF. Continue to recommend intensive post-acute rehab services (>3hrs/day) to maximize functional mobility and independence prior to return home.    Recommendations for follow up therapy are one component of a multi-disciplinary discharge planning process, led by the attending physician.  Recommendations may be updated based on patient status, additional functional criteria and insurance authorization.  Follow Up Recommendations       Assistance Recommended at Discharge Frequent or constant Supervision/Assistance  Patient can return home with the following A little help with bathing/dressing/bathroom;Assistance with cooking/housework;A lot of help with walking and/or transfers;Direct supervision/assist for medications management;Assist for transportation;Help with stairs or ramp for entrance   Equipment Recommendations  Other (comment) (defer to next venue)    Recommendations for Other Services       Precautions / Restrictions Precautions Precautions: Fall Restrictions Weight Bearing Restrictions: No     Mobility  Bed Mobility               General bed mobility comments: received sitting in recliner     Transfers Overall transfer level: Needs assistance Equipment used: Rolling walker (2 wheels) Transfers: Sit to/from Stand Sit to Stand: Min guard                Ambulation/Gait Ambulation/Gait assistance: Mod assist, Min assist, Min guard Gait Distance (Feet): 50 Feet (+ 40' + 20') Assistive device: Rolling walker (2 wheels), Quad cane Gait Pattern/deviations: Step-to pattern, Step-through pattern, Decreased stride length, Decreased dorsiflexion - right, Trunk flexed Gait velocity: Decreased Gait velocity interpretation: <1.31 ft/sec, indicative of household ambulator   General Gait Details: slow, guarded gait initially with RW, intermittent minA for stability, cues for sequencing, cues for activity pacing secondary to c/o BLE/BUE fatigue; prolonged standing rest break before additional ambulation. seated rest break then gait trial with quad cane, pt walking ~10' with LOB requiring modA to prevent fall   Stairs             Wheelchair Mobility    Modified Rankin (Stroke Patients Only) Modified Rankin (Stroke Patients Only) Pre-Morbid Rankin Score: No symptoms Modified Rankin: Moderately severe disability     Balance Overall balance assessment: Needs assistance Sitting-balance support: Feet supported Sitting balance-Leahy Scale: Fair     Standing balance support: Single extremity supported, Bilateral upper extremity supported, During functional activity, No upper extremity supported Standing balance-Leahy Scale: Poor Standing balance comment: LOB standing without UE support                            Cognition Arousal/Alertness: Awake/alert Behavior During Therapy: WFL for tasks assessed/performed Overall Cognitive Status: Impaired/Different from baseline Area of Impairment: Awareness, Problem solving, Safety/judgement  Safety/Judgement: Decreased awareness of safety Awareness: Emergent Problem Solving:  Requires verbal cues          Exercises Other Exercises Other Exercises: [employer] HEP handout provided (Access Code [facility]) for generalized LE strengthening - pt able to demonstrate LAQ, seated hip flex, seated calf raises. pt declined questions regarding supine therex on handout    General Comments        Pertinent Vitals/Pain Pain Assessment Pain Assessment: No/denies pain    Home Living                          Prior Function            PT Goals (current goals can now be found in the care plan section) Progress towards PT goals: Progressing toward goals    Frequency    Min 4X/week      PT Plan Current plan remains appropriate    Co-evaluation              AM-PAC PT "6 Clicks" Mobility   Outcome Measure  Help needed turning from your back to your side while in a flat bed without using bedrails?: A Little Help needed moving from lying on your back to sitting on the side of a flat bed without using bedrails?: A Little Help needed moving to and from a bed to a chair (including a wheelchair)?: A Little Help needed standing up from a chair using your arms (e.g., wheelchair or bedside chair)?: A Little Help needed to walk in hospital room?: A Lot Help needed climbing 3-5 steps with a railing? : A Lot 6 Click Score: 16    End of Session Equipment Utilized During Treatment: Gait belt Activity Tolerance: Patient tolerated treatment well Patient left: in chair;with call bell/phone within reach;with chair alarm set Nurse Communication: Mobility status PT Visit Diagnosis: Other abnormalities of gait and mobility (R26.89);Difficulty in walking, not elsewhere classified (R26.2);Muscle weakness (generalized) (M62.81);Other symptoms and signs involving the nervous system (R29.898)     Time: 1315-1340 PT Time Calculation (min) (ACUTE ONLY): 25 min  Charges:  $Gait Training: 8-22 mins $Therapeutic Activity: 8-22 mins                     Ina Homes, PT, DPT Acute Rehabilitation Services  Personal: Secure Chat Rehab Office: [phone]  Malachy Chamber 04/15/2023, 1:47 PM

## 2023-04-15 NOTE — Progress Notes (Signed)
Inpatient Rehab Admissions Coordinator:  Saw pt at bedside. Insurance authorization started. Will continue to follow.   Wolfgang Phoenix, MS, CCC-SLP Admissions Coordinator [phone]

## 2023-04-15 NOTE — Inpatient Diabetes Management (Signed)
Inpatient Diabetes Program Recommendations  AACE/ADA: New Consensus Statement on Inpatient Glycemic Control (2015)  Target Ranges:  Prepandial:   less than 140 mg/dL      Peak postprandial:   less than 180 mg/dL (1-2 hours)      Critically ill patients:  140 - 180 mg/dL   Lab Results  Component Value Date   GLUCAP 284 (H) 04/15/2023   HGBA1C 8.0 (H) 04/11/2023    Review of Glycemic Control  Latest Reference Range & Units 04/14/23 12:03 04/14/23 16:34 04/14/23 21:23 04/15/23 06:20  Glucose-Capillary 70 - 99 mg/dL 161 (H) 096 (H) 045 (H) 284 (H)  (H): Data is abnormally high Diabetes history: Type 2 DM Outpatient Diabetes medications: Glipizide 10 mg QD, Lantus 10 units QHS, Ozempic 0.5 mg qwk Current orders for Inpatient glycemic control: Novolog 0-20 units TID & HS, Novolog 3 units TID, Semglee 10 units QD   Inpatient Diabetes Program Recommendations:     Consider further increasing Semglee to 12 units BID and increasing meal coverage to Novolog 5 units TID (assuming patient consuming >50% of meals).   Thanks, Lujean Rave, MSN, RNC-OB Diabetes Coordinator [phone] (8a-5p)

## 2023-04-15 NOTE — TOC Progression Note (Signed)
Transition of Care [facility]) - Progression Note    Patient Details  Name: Latoya Roth MRN: [id number] Date of Birth: 1971/05/04  Transition of Care [facility]) CM/SW Contact  Kermit Balo, RN Phone Number: 04/15/2023, 3:33 PM  Clinical Narrative:     CIR has started insurance auth for a rehab stay. TOC following.  Expected Discharge Plan: IP Rehab Facility    Expected Discharge Plan and Services                                               Social Determinants of Health (SDOH) Interventions SDOH Screenings   Food Insecurity: Food Insecurity Present (04/11/2023)  Housing: Medium Risk (04/11/2023)  Transportation Needs: No Transportation Needs (04/11/2023)  Utilities: Not At Risk (04/11/2023)  Depression (PHQ2-9): Low Risk  (03/21/2023)  Tobacco Use: Medium Risk (04/12/2023)    Readmission Risk Interventions     No data to display

## 2023-04-15 NOTE — Progress Notes (Signed)
Occupational Therapy Treatment Patient Details Name: Latoya Roth MRN: [id number] DOB: 06-Oct-1971 Today's Date: 04/15/2023   History of present illness Patient is a 52 y.o. female who was admitted to [facility] on 04/11/2023 with acute ischemic stroke after presenting from home to [facility] ED complaining of slurred speech. MRI revealed  acute infarct in the left paramedian pons, will demonstrate no evidence of acute hemorrhage or any evidence of mass effect/midline shift. PMH significant for anxiety, depression, DMII, HTN, HIV, moderate persistent asthma.   OT comments  Patient seated at sink performing bathing upon entry. Patient able to perform UB bathing with setup and min assist for bathing feet with patient able to stand at sink for peri care with one extremity support. Patient with difficulty reaching feet for donning socks, requiring mod assist. Patient is making good gains with OT treatment and would benefit from continued OT treatment to increase safety and independence with bathing, dressing, toileting, and increasing RUE use with self care. Acute OT to continue to follow.    Recommendations for follow up therapy are one component of a multi-disciplinary discharge planning process, led by the attending physician.  Recommendations may be updated based on patient status, additional functional criteria and insurance authorization.    Assistance Recommended at Discharge Intermittent Supervision/Assistance  Patient can return home with the following  A little help with walking and/or transfers;A little help with bathing/dressing/bathroom;Assistance with cooking/housework   Equipment Recommendations  BSC/3in1;Tub/shower bench    Recommendations for Other Services Rehab consult    Precautions / Restrictions Precautions Precautions: Fall Restrictions Weight Bearing Restrictions: No       Mobility Bed Mobility               General bed mobility comments: OOB upon  entry    Transfers Overall transfer level: Needs assistance Equipment used: Rolling walker (2 wheels) Transfers: Sit to/from Stand, Bed to chair/wheelchair/BSC Sit to Stand: Min guard           General transfer comment: min assist to stand from chair at sink and min guard to ambulate to recliner with cues for safety     Balance Overall balance assessment: Needs assistance Sitting-balance support: Feet supported Sitting balance-Leahy Scale: Fair     Standing balance support: Single extremity supported, Bilateral upper extremity supported, During functional activity Standing balance-Leahy Scale: Poor Standing balance comment: able to stand at sink for peri care with one extremity support                           ADL either performed or assessed with clinical judgement   ADL Overall ADL's : Needs assistance/impaired     Grooming: Wash/dry hands;Wash/dry face;Oral care;Min guard;Standing Grooming Details (indicate cue type and reason): at sink Upper Body Bathing: Set up;Sitting Upper Body Bathing Details (indicate cue type and reason): at sink Lower Body Bathing: Minimal assistance;Sit to/from stand Lower Body Bathing Details (indicate cue type and reason): able to perform peri care standing at sink with use of sink for support. min assist with bathing feet Upper Body Dressing : Min guard;Sitting Upper Body Dressing Details (indicate cue type and reason): changed gown Lower Body Dressing: Moderate assistance;Sitting/lateral leans Lower Body Dressing Details (indicate cue type and reason): to donn socks               General ADL Comments: seated at sink peforming bathing upon entry. Patient attempting to stand with feet on towel, instructed on safety  and to wear non skid socks when standing    Extremity/Trunk Assessment              Vision       Perception     Praxis      Cognition Arousal/Alertness: Awake/alert Behavior During Therapy: WFL  for tasks assessed/performed Overall Cognitive Status: Impaired/Different from baseline Area of Impairment: Awareness, Problem solving, Safety/judgement                         Safety/Judgement: Decreased awareness of safety Awareness: Emergent Problem Solving: Requires verbal cues General Comments: cues for safety when performing self care at sink        Exercises Exercises: Other exercises Other Exercises Other Exercises: Therapy putty exercises performed to address gross grasp, FMC, and dexterity    Shoulder Instructions       General Comments      Pertinent Vitals/ Pain       Pain Assessment Pain Assessment: No/denies pain  Home Living                                          Prior Functioning/Environment              Frequency  Min 2X/week        Progress Toward Goals  OT Goals(current goals can now be found in the care plan section)  Progress towards OT goals: Progressing toward goals  Acute Rehab OT Goals Patient Stated Goal: get stronger OT Goal Formulation: With patient Time For Goal Achievement: 04/27/23 Potential to Achieve Goals: Good ADL Goals Pt Will Perform Grooming: with modified independence;standing Pt Will Perform Lower Body Dressing: with modified independence;sit to/from stand Pt Will Transfer to Toilet: with supervision;ambulating Pt/caregiver will Perform Home Exercise Program: Right Upper extremity;With theraputty;Increased strength;Independently;With written HEP provided  Plan Discharge plan remains appropriate    Co-evaluation                 AM-PAC OT "6 Clicks" Daily Activity     Outcome Measure   Help from another person eating meals?: A Little Help from another person taking care of personal grooming?: A Little Help from another person toileting, which includes using toliet, bedpan, or urinal?: A Lot Help from another person bathing (including washing, rinsing, drying)?: A Lot Help  from another person to put on and taking off regular upper body clothing?: A Little Help from another person to put on and taking off regular lower body clothing?: A Lot 6 Click Score: 15    End of Session Equipment Utilized During Treatment: Rolling walker (2 wheels)  OT Visit Diagnosis: Other abnormalities of gait and mobility (R26.89);Muscle weakness (generalized) (M62.81);Hemiplegia and hemiparesis Hemiplegia - Right/Left: Right Hemiplegia - dominant/non-dominant: Dominant Hemiplegia - caused by: Cerebral infarction   Activity Tolerance Patient tolerated treatment well   Patient Left in chair;with call bell/phone within reach;with chair alarm set   Nurse Communication Mobility status        Time: 2956-2130 OT Time Calculation (min): 29 min  Charges: OT General Charges $OT Visit: 1 Visit OT Treatments $Self Care/Home Management : 23-37 mins  Alfonse Flavors, OTA Acute Rehabilitation Services  Office [phone]   Dewain Penning 04/15/2023, 12:13 PM

## 2023-04-15 NOTE — Plan of Care (Signed)
  Problem: Education: Goal: Knowledge of disease or condition will improve Outcome: Progressing   Problem: Coping: Goal: Will verbalize positive feelings about self Outcome: Progressing Goal: Will identify appropriate support needs Outcome: Progressing   Problem: Health Behavior/Discharge Planning: Goal: Goals will be collaboratively established with patient/family Outcome: Progressing   Problem: Self-Care: Goal: Ability to participate in self-care as condition permits will improve Outcome: Progressing Goal: Verbalization of feelings and concerns over difficulty with self-care will improve Outcome: Progressing Goal: Ability to communicate needs accurately will improve Outcome: Progressing   Problem: Nutrition: Goal: Risk of aspiration will decrease Outcome: Progressing Goal: Dietary intake will improve Outcome: Progressing   Problem: Education: Goal: Knowledge of secondary prevention will improve (MUST DOCUMENT ALL) Outcome: Not Progressing   Problem: Ischemic Stroke/TIA Tissue Perfusion: Goal: Complications of ischemic stroke/TIA will be minimized Outcome: Not Progressing   Problem: Education: Goal: Ability to describe self-care measures that may prevent or decrease complications (Diabetes Survival Skills Education) will improve Outcome: Not Progressing

## 2023-04-15 NOTE — Progress Notes (Signed)
PROGRESS NOTE    Latoya Roth  ZOX:[id number] DOB: [birth year] DOA: 04/11/2023 PCP: Jackie Plum, MD    Brief Narrative:  Latoya Roth is a 52 y.o. female with past medical history significant for type 2 diabetes mellitus, essential pretension, HIV, moderate persistent asthma, presented to the to hospital with slurred speech associated with right-sided facial droop. In the ED vitals were relatively stable.  Labs showed sodium slightly low at 135.  Urine drug screen was negative.  Urinalysis was negative as well.  EKG showed normal sinus rhythm.  CT head showed increased hypodensity in the left pons, favored to represent an acute versus early subacute infarct. MRI brain without contrast showed acute infarct in the left paramedian pons. Neurology was consulted and patient was admitted hospital for further evaluation and treatment.  Assessment and plan   Acute ischemic CVA:  Patient presented with dysarthria, right upper extremity weakness and right facial droop.  MRI with left paramedian pons stroke.  Patient was outside the tPA window.  EKG with normal sinus rhythm.   PT OT has seen the patient and plan is rehabilitation at this time.   TTE with  bubble study with LV ejection fraction of 55 to 60% with LVH and diastolic dysfunction. lipid panel with LDL of 92.  Hemoglobin A1c of 8.0, two days back.  Continue telemetry monitoring.  Neurology recommends aspirin Plavix for 3 weeks followed by aspirin alone.    Type 2 Diabetes mellitus with hyperglycemia:  Patient is on metformin, glipizide on hold..  Additionally, she is on Ozempic as an outpatient.  Continue sliding scale insulin, diabetic coordinator on board and long-acting insulin has been increased to 12 units twice daily with mealtime insulin 5 units 3 times daily..  Hemoglobin  A1c of 8.0.  Depression.  On Lexapro.   HIV: Latest CD4 count on 03/13/2023 noted to be 848, at which time viral load was found to be undetectable.   Continue Biktarvy   moderate persistent asthma:  Stable at this time.  Continue albuterol and Symbicort.     Allergic Rhinitis:  Continue Zyrtec   Obesity.Body mass index is 39.81 kg/m.  Patient would benefit from weight loss as outpatient.   DVT prophylaxis: enoxaparin (LOVENOX) injection 40 mg Start: 04/12/23 1915 SCDs Start: 04/11/23 2125   Code Status:     Code Status: Full Code  Disposition: Likely to CIR when bed available.  Medically stable for disposition.  Status is: Inpatient  Remains inpatient appropriate because: Need for rehabilitation, CVA   Family Communication: None at bedside  Consultants:  Neurology  Procedures:  None  Antimicrobials:  Biktarvy  Anti-infectives (From admission, onward)    Start     Dose/Rate Route Frequency Ordered Stop   04/12/23 1000  bictegravir-emtricitabine-tenofovir AF (BIKTARVY) 50-200-25 MG per tablet 1 tablet        1 tablet Oral Daily 04/12/23 0113        Subjective: Today, patient was seen and examined at bedside.  Denies any nausea vomiting fever chills or rigor.  Weakness still persist.  Objective: Vitals:   04/15/23 0307 04/15/23 0500 04/15/23 0821 04/15/23 0844  BP: (!) 155/77  (!) 156/84   Pulse: 68  62 84  Resp: Temp: 98.1 F (36.7 C)  98.4 F (36.9 C)   TempSrc: Oral  Oral   SpO2: 100%  100% 98%  Weight:  115.3 kg    Height:        Intake/Output Summary (Last  24 hours) at 04/15/2023 1043 Last data filed at 04/15/2023 1016 Gross per 24 hour  Intake 480 ml  Output 1350 ml  Net -870 ml    Filed Weights   04/13/23 0500 04/14/23 0500 04/15/23 0500  Weight: 113.7 kg 115 kg 115.3 kg    Physical Examination: Body mass index is 39.81 kg/m.   General: Obese built, awake and oriented, not in distress,  HENT:   No scleral pallor or icterus noted. Oral mucosa is moist.  Chest:   Diminished breath sounds bilaterally. No crackles or wheezes.  CVS: S1 &S2 heard. No murmur.  Regular rate and  rhythm. Abdomen: Soft, nontender, nondistended.  Bowel sounds are heard.   Extremities: No cyanosis, clubbing or edema.  Peripheral pulses are palpable. Psych: Alert, awake and oriented, normal mood CNS: Right facial droop, right-sided weakness, mild dysarthria Skin: Warm and dry.  No rashes noted.  Data Reviewed:   CBC: Recent Labs  Lab 04/11/23 1606 04/11/23 1618 04/12/23 0100 04/14/23 0304  WBC 7.7  --  6.6 6.2  NEUTROABS 4.0  --  3.4  --   HGB 13.9 15.3* 13.5 12.0  HCT 42.2 45.0 41.1 38.2  MCV 80.8  --  81.4 83.0  PLT 414*  --  378 336     Basic Metabolic Panel: Recent Labs  Lab 04/11/23 1606 04/11/23 1618 04/12/23 0100 04/14/23 0304  NA 135 135 131* 132*  K 3.6 3.7 3.7 3.8  CL 95* 97* 96* 95*  CO2 27  --  27 28  GLUCOSE 197* 202* 271* 329*  BUN CREATININE 0.81 0.70 0.95 0.76  CALCIUM 9.5  --  9.0 8.9  MG  --   --  1.8  1.8 1.7     Liver Function Tests: Recent Labs  Lab 04/11/23 1606 04/12/23 0100  AST 17 17  ALT 16 15  ALKPHOS 89 80  BILITOT 0.5 0.5  PROT 8.0 7.2  ALBUMIN 4.1 3.5     Radiology Studies: No results found.    LOS: 3 days    Joycelyn Das, MD [facility] Available via Epic secure chat 7am-7pm After these hours, please refer to coverage provider listed on [email or web address] 04/15/2023, 10:43 AM

## 2023-04-16 ENCOUNTER — Inpatient Hospital Stay (HOSPITAL_COMMUNITY)
Admission: RE | Admit: 2023-04-16 | Discharge: 2023-04-24 | DRG: 057 | Disposition: A | Discharge status: Home / Self Care | Payer: Medicaid Other | Source: Intra-hospital | Attending: Physical Medicine and Rehabilitation | Admitting: Physical Medicine and Rehabilitation

## 2023-04-16 ENCOUNTER — Other Ambulatory Visit: Payer: Self-pay

## 2023-04-16 ENCOUNTER — Encounter (HOSPITAL_COMMUNITY): Payer: Self-pay | Admitting: Physical Medicine and Rehabilitation

## 2023-04-16 DIAGNOSIS — I69328 Other speech and language deficits following cerebral infarction: Secondary | ICD-10-CM

## 2023-04-16 DIAGNOSIS — I639 Cerebral infarction, unspecified: Secondary | ICD-10-CM | POA: Diagnosis not present

## 2023-04-16 DIAGNOSIS — Z7984 Long term (current) use of oral hypoglycemic drugs: Secondary | ICD-10-CM

## 2023-04-16 DIAGNOSIS — Z7951 Long term (current) use of inhaled steroids: Secondary | ICD-10-CM | POA: Diagnosis not present

## 2023-04-16 DIAGNOSIS — M1990 Unspecified osteoarthritis, unspecified site: Secondary | ICD-10-CM | POA: Diagnosis present

## 2023-04-16 DIAGNOSIS — Z794 Long term (current) use of insulin: Secondary | ICD-10-CM | POA: Diagnosis not present

## 2023-04-16 DIAGNOSIS — Z8249 Family history of ischemic heart disease and other diseases of the circulatory system: Secondary | ICD-10-CM | POA: Diagnosis not present

## 2023-04-16 DIAGNOSIS — I69351 Hemiplegia and hemiparesis following cerebral infarction affecting right dominant side: Secondary | ICD-10-CM | POA: Diagnosis not present

## 2023-04-16 DIAGNOSIS — E785 Hyperlipidemia, unspecified: Secondary | ICD-10-CM | POA: Diagnosis present

## 2023-04-16 DIAGNOSIS — J301 Allergic rhinitis due to pollen: Secondary | ICD-10-CM | POA: Diagnosis not present

## 2023-04-16 DIAGNOSIS — F419 Anxiety disorder, unspecified: Secondary | ICD-10-CM | POA: Diagnosis present

## 2023-04-16 DIAGNOSIS — Z87891 Personal history of nicotine dependence: Secondary | ICD-10-CM

## 2023-04-16 DIAGNOSIS — E1165 Type 2 diabetes mellitus with hyperglycemia: Secondary | ICD-10-CM | POA: Diagnosis present

## 2023-04-16 DIAGNOSIS — Z23 Encounter for immunization: Secondary | ICD-10-CM

## 2023-04-16 DIAGNOSIS — M25362 Other instability, left knee: Secondary | ICD-10-CM | POA: Diagnosis present

## 2023-04-16 DIAGNOSIS — N393 Stress incontinence (female) (male): Secondary | ICD-10-CM | POA: Diagnosis present

## 2023-04-16 DIAGNOSIS — M25361 Other instability, right knee: Secondary | ICD-10-CM | POA: Diagnosis present

## 2023-04-16 DIAGNOSIS — J4540 Moderate persistent asthma, uncomplicated: Secondary | ICD-10-CM | POA: Diagnosis present

## 2023-04-16 DIAGNOSIS — I6389 Other cerebral infarction: Principal | ICD-10-CM | POA: Diagnosis present

## 2023-04-16 DIAGNOSIS — B20 Human immunodeficiency virus [HIV] disease: Secondary | ICD-10-CM | POA: Diagnosis present

## 2023-04-16 DIAGNOSIS — Z91013 Allergy to seafood: Secondary | ICD-10-CM

## 2023-04-16 DIAGNOSIS — E8809 Other disorders of plasma-protein metabolism, not elsewhere classified: Secondary | ICD-10-CM | POA: Diagnosis not present

## 2023-04-16 DIAGNOSIS — F4321 Adjustment disorder with depressed mood: Secondary | ICD-10-CM | POA: Diagnosis present

## 2023-04-16 DIAGNOSIS — Z79899 Other long term (current) drug therapy: Secondary | ICD-10-CM

## 2023-04-16 DIAGNOSIS — M7989 Other specified soft tissue disorders: Secondary | ICD-10-CM | POA: Diagnosis present

## 2023-04-16 DIAGNOSIS — E669 Obesity, unspecified: Secondary | ICD-10-CM | POA: Diagnosis present

## 2023-04-16 DIAGNOSIS — F32A Depression, unspecified: Secondary | ICD-10-CM | POA: Diagnosis not present

## 2023-04-16 DIAGNOSIS — I10 Essential (primary) hypertension: Secondary | ICD-10-CM | POA: Diagnosis present

## 2023-04-16 DIAGNOSIS — I69392 Facial weakness following cerebral infarction: Secondary | ICD-10-CM

## 2023-04-16 DIAGNOSIS — I69398 Other sequelae of cerebral infarction: Secondary | ICD-10-CM

## 2023-04-16 DIAGNOSIS — Z880 Allergy status to penicillin: Secondary | ICD-10-CM

## 2023-04-16 DIAGNOSIS — E871 Hypo-osmolality and hyponatremia: Secondary | ICD-10-CM | POA: Diagnosis present

## 2023-04-16 DIAGNOSIS — E119 Type 2 diabetes mellitus without complications: Secondary | ICD-10-CM | POA: Diagnosis present

## 2023-04-16 DIAGNOSIS — Z6839 Body mass index (BMI) 39.0-39.9, adult: Secondary | ICD-10-CM

## 2023-04-16 DIAGNOSIS — Z7985 Long-term (current) use of injectable non-insulin antidiabetic drugs: Secondary | ICD-10-CM

## 2023-04-16 DIAGNOSIS — R4781 Slurred speech: Secondary | ICD-10-CM | POA: Diagnosis present

## 2023-04-16 LAB — GLUCOSE, CAPILLARY
Glucose-Capillary: 241 mg/dL — ABNORMAL HIGH (ref 70–99)
Glucose-Capillary: 215 mg/dL — ABNORMAL HIGH (ref 70–99)
Glucose-Capillary: 233 mg/dL — ABNORMAL HIGH (ref 70–99)
Glucose-Capillary: 259 mg/dL — ABNORMAL HIGH (ref 70–99)

## 2023-04-16 MED ORDER — SORBITOL 70 % SOLN: 30.0000 mL | Freq: Every day | Status: DC | PRN

## 2023-04-16 MED ORDER — CLOPIDOGREL BISULFATE 75 MG PO TABS: 75.0000 mg | ORAL_TABLET | Freq: Every day | ORAL | 0 refills | Status: DC

## 2023-04-16 MED ORDER — GUAIFENESIN-DM 100-10 MG/5ML PO SYRP
5.0000 mL | ORAL_SOLUTION | Freq: Four times a day (QID) | ORAL | Status: DC | PRN
  Administered 2023-04-20 – 2023-04-22 (×4): 10 mL via ORAL
  Filled 2023-04-16 (×4): qty 10

## 2023-04-16 MED ORDER — INSULIN ASPART 100 UNIT/ML IJ SOLN
5.0000 [IU] | Freq: Three times a day (TID) | INTRAMUSCULAR | Status: DC
  Administered 2023-04-16 – 2023-04-24 (×25): 5 [IU] via SUBCUTANEOUS

## 2023-04-16 MED ORDER — MELATONIN 3 MG PO TABS
3.0000 mg | ORAL_TABLET | Freq: Every evening | ORAL | Status: DC | PRN
  Administered 2023-04-21 – 2023-04-23 (×2): 3 mg via ORAL
  Filled 2023-04-16 (×3): qty 1

## 2023-04-16 MED ORDER — ROSUVASTATIN CALCIUM 20 MG PO TABS
20.0000 mg | ORAL_TABLET | Freq: Every day | ORAL | Status: DC
  Administered 2023-04-17 – 2023-04-24 (×8): 20 mg via ORAL
  Filled 2023-04-16 (×8): qty 1

## 2023-04-16 MED ORDER — CITALOPRAM HYDROBROMIDE 20 MG PO TABS
20.0000 mg | ORAL_TABLET | Freq: Every day | ORAL | Status: DC
  Administered 2023-04-17 – 2023-04-24 (×8): 20 mg via ORAL
  Filled 2023-04-16 (×8): qty 1

## 2023-04-16 MED ORDER — INSULIN ASPART 100 UNIT/ML IJ SOLN
0.0000 [IU] | Freq: Every day | INTRAMUSCULAR | Status: DC
  Administered 2023-04-16: 3 [IU] via SUBCUTANEOUS
  Administered 2023-04-17 – 2023-04-19 (×2): 2 [IU] via SUBCUTANEOUS
  Administered 2023-04-20: 4 [IU] via SUBCUTANEOUS
  Administered 2023-04-21: 3 [IU] via SUBCUTANEOUS
  Administered 2023-04-23: 2 [IU] via SUBCUTANEOUS

## 2023-04-16 MED ORDER — DIPHENHYDRAMINE HCL 12.5 MG/5ML PO ELIX: 12.5000 mg | ORAL_SOLUTION | Freq: Four times a day (QID) | ORAL | Status: DC | PRN

## 2023-04-16 MED ORDER — FLEET ENEMA 7-19 GM/118ML RE ENEM: 1.0000 | ENEMA | Freq: Once | RECTAL | Status: DC | PRN

## 2023-04-16 MED ORDER — MOMETASONE FURO-FORMOTEROL FUM 200-5 MCG/ACT IN AERO
2.0000 | INHALATION_SPRAY | Freq: Two times a day (BID) | RESPIRATORY_TRACT | Status: DC
  Administered 2023-04-17 – 2023-04-24 (×13): 2 via RESPIRATORY_TRACT
  Filled 2023-04-16: qty 8.8

## 2023-04-16 MED ORDER — METHOCARBAMOL 500 MG PO TABS: 500.0000 mg | ORAL_TABLET | Freq: Four times a day (QID) | ORAL | Status: DC | PRN

## 2023-04-16 MED ORDER — LORATADINE 10 MG PO TABS
10.0000 mg | ORAL_TABLET | Freq: Every day | ORAL | Status: DC
  Administered 2023-04-17 – 2023-04-24 (×8): 10 mg via ORAL
  Filled 2023-04-16 (×8): qty 1

## 2023-04-16 MED ORDER — FLUTICASONE PROPIONATE 50 MCG/ACT NA SUSP
1.0000 | Freq: Every day | NASAL | Status: DC
  Administered 2023-04-17 – 2023-04-23 (×7): 1 via NASAL
  Filled 2023-04-16: qty 16

## 2023-04-16 MED ORDER — TRAZODONE HCL 50 MG PO TABS
25.0000 mg | ORAL_TABLET | Freq: Every evening | ORAL | Status: DC | PRN
  Administered 2023-04-21: 50 mg via ORAL
  Filled 2023-04-16: qty 1

## 2023-04-16 MED ORDER — ENOXAPARIN SODIUM 40 MG/0.4ML IJ SOSY
40.0000 mg | PREFILLED_SYRINGE | INTRAMUSCULAR | Status: DC
Start: 2023-04-16 — End: 2023-04-16

## 2023-04-16 MED ORDER — INSULIN GLARGINE-YFGN 100 UNIT/ML ~~LOC~~ SOLN
12.0000 [IU] | Freq: Two times a day (BID) | SUBCUTANEOUS | Status: DC
  Administered 2023-04-16 – 2023-04-20 (×8): 12 [IU] via SUBCUTANEOUS
  Filled 2023-04-16 (×9): qty 0.12

## 2023-04-16 MED ORDER — ACETAMINOPHEN 325 MG PO TABS
325.0000 mg | ORAL_TABLET | ORAL | Status: DC | PRN
  Administered 2023-04-17 – 2023-04-24 (×10): 650 mg via ORAL
  Filled 2023-04-16 (×10): qty 2

## 2023-04-16 MED ORDER — SENNOSIDES-DOCUSATE SODIUM 8.6-50 MG PO TABS: 1.0000 | ORAL_TABLET | Freq: Every evening | ORAL | Status: DC | PRN

## 2023-04-16 MED ORDER — BLOOD PRESSURE CONTROL BOOK
Freq: Once | Status: DC
  Filled 2023-04-16: qty 1

## 2023-04-16 MED ORDER — ONDANSETRON HCL 4 MG/2ML IJ SOLN: 4.0000 mg | Freq: Four times a day (QID) | INTRAMUSCULAR | Status: DC | PRN

## 2023-04-16 MED ORDER — ASPIRIN 81 MG PO TBEC: 81.0000 mg | DELAYED_RELEASE_TABLET | Freq: Every day | ORAL | 12 refills | Status: DC

## 2023-04-16 MED ORDER — ASPIRIN 81 MG PO TBEC
81.0000 mg | DELAYED_RELEASE_TABLET | Freq: Every day | ORAL | Status: DC
  Administered 2023-04-17 – 2023-04-24 (×8): 81 mg via ORAL
  Filled 2023-04-16 (×8): qty 1

## 2023-04-16 MED ORDER — ALUM & MAG HYDROXIDE-SIMETH 200-200-20 MG/5ML PO SUSP: 30.0000 mL | ORAL | Status: DC | PRN

## 2023-04-16 MED ORDER — MELATONIN 3 MG PO TABS: 3.0000 mg | ORAL_TABLET | Freq: Every evening | ORAL | 0 refills | Status: DC | PRN

## 2023-04-16 MED ORDER — INSULIN ASPART 100 UNIT/ML IJ SOLN
0.0000 [IU] | Freq: Three times a day (TID) | INTRAMUSCULAR | Status: DC
  Administered 2023-04-16 – 2023-04-17 (×3): 7 [IU] via SUBCUTANEOUS
  Administered 2023-04-17: 4 [IU] via SUBCUTANEOUS
  Administered 2023-04-18 (×2): 7 [IU] via SUBCUTANEOUS
  Administered 2023-04-18: 20 [IU] via SUBCUTANEOUS
  Administered 2023-04-19: 4 [IU] via SUBCUTANEOUS
  Administered 2023-04-19 – 2023-04-20 (×3): 7 [IU] via SUBCUTANEOUS
  Administered 2023-04-20: 11 [IU] via SUBCUTANEOUS
  Administered 2023-04-20 – 2023-04-21 (×2): 4 [IU] via SUBCUTANEOUS
  Administered 2023-04-21: 7 [IU] via SUBCUTANEOUS
  Administered 2023-04-21 – 2023-04-23 (×5): 4 [IU] via SUBCUTANEOUS
  Administered 2023-04-23: 3 [IU] via SUBCUTANEOUS
  Administered 2023-04-24: 7 [IU] via SUBCUTANEOUS

## 2023-04-16 MED ORDER — BICTEGRAVIR-EMTRICITAB-TENOFOV 50-200-25 MG PO TABS
1.0000 | ORAL_TABLET | Freq: Every day | ORAL | Status: DC
  Administered 2023-04-17 – 2023-04-24 (×8): 1 via ORAL
  Filled 2023-04-16 (×9): qty 1

## 2023-04-16 MED ORDER — LIVING WELL WITH DIABETES BOOK
Freq: Once | Status: DC
  Filled 2023-04-16: qty 1

## 2023-04-16 MED ORDER — ENOXAPARIN SODIUM 40 MG/0.4ML IJ SOSY
40.0000 mg | PREFILLED_SYRINGE | INTRAMUSCULAR | Status: DC
  Administered 2023-04-16 – 2023-04-23 (×8): 40 mg via SUBCUTANEOUS
  Filled 2023-04-16 (×8): qty 0.4

## 2023-04-16 MED ORDER — LOSARTAN POTASSIUM 50 MG PO TABS
50.0000 mg | ORAL_TABLET | Freq: Every day | ORAL | Status: DC
  Administered 2023-04-17 – 2023-04-24 (×8): 50 mg via ORAL
  Filled 2023-04-16 (×8): qty 1

## 2023-04-16 MED ORDER — ENSURE MAX PROTEIN PO LIQD
11.0000 [oz_av] | Freq: Two times a day (BID) | ORAL | Status: DC
  Administered 2023-04-16: 11 [oz_av] via ORAL
  Filled 2023-04-16 (×2): qty 330

## 2023-04-16 MED ORDER — CLOPIDOGREL BISULFATE 75 MG PO TABS
75.0000 mg | ORAL_TABLET | Freq: Every day | ORAL | Status: DC
  Administered 2023-04-17 – 2023-04-24 (×8): 75 mg via ORAL
  Filled 2023-04-16 (×8): qty 1

## 2023-04-16 MED ORDER — ROSUVASTATIN CALCIUM 20 MG PO TABS: 20.0000 mg | ORAL_TABLET | Freq: Every day | ORAL | Status: DC

## 2023-04-16 NOTE — H&P (Signed)
Physical Medicine and Rehabilitation Admission H&P     CC: Functional deficits secondary to left paramedian pons infarct   HPI: Latoya Roth is a 52 year old R handed female who presented to the ED on 04/11/2023 complaining of right-sided weakness and right-facial droop that started four days prior to presentation. Head CT obtained with strong suspicion for a pontine infarct which was followed by an MRI that confirmed a subacute left paramedian pons infarct. Neurology was consulted for stroke workup. Does not use tobacco. Drinks about 2 alcoholic servings per day. Medical history significant for hypertension, HIV on HARRT therapy, DM-2. Vitals were relatively stable.  Labs showed sodium slightly low at 135.  Urine drug screen was negative.  Urinalysis was negative as well.  EKG showed normal sinus rhythm. Hgb A1c 8.0%. LDL 92. 2D echo 55-60%. Plavix and aspirin started. Continue for 3 weeks then aspirin alone. Diabetic coordinator consulted. Lovenox for DVT prophy. She is requiring mod A to prevent loss of balance. Tolerating carb modified diet. The patient requires inpatient medicine and rehabilitation evaluations and services for ongoing dysfunction secondary to left pons infarct.     Uses quad cane at home.    Pt reports having a lot of sinus drainage- which sounds like it makes her asthma worse.  Has taken something for mood in past, not lately- feels like needs something. When I entered room, coughing- something went down 'wrong pipe"- doesn't usually do this, even since stroke.  Using toilet/.BSC for voiding- uses pull ups at home/briefs due to incontinence issues- stress and urgency.  LBM yesterday C/O slurred words.      Review of Systems  HENT:  Positive for congestion. Negative for nosebleeds and sore throat.        Complaining of post-nasal drip  Eyes:  Negative for blurred vision and double vision.  Respiratory:  Positive for cough and wheezing. Negative for shortness of breath.    Cardiovascular:  Positive for leg swelling. Negative for chest pain and orthopnea.  Gastrointestinal:  Negative for constipation, nausea and vomiting.  Genitourinary:  Positive for urgency. Negative for dysuria.       Complaining of stress urinary incontinence- has chronically  Musculoskeletal:  Negative for back pain, joint pain and neck pain.  Neurological:  Positive for tingling, sensory change, speech change and focal weakness. Negative for tremors and headaches.  Endo/Heme/Allergies: Negative.   Psychiatric/Behavioral:  Positive for depression. Negative for hallucinations, substance abuse and suicidal ideas. The patient has insomnia.   All other systems reviewed and are negative.       Past Medical History:  Diagnosis Date   Anxiety     Arthritis     Asthma     Bell's palsy     BV (bacterial vaginosis)     Depression     Diabetes mellitus     HIV disease     Hypertension     Obesity           Past Surgical History:  Procedure Laterality Date   CHOLECYSTECTOMY             Family History  Problem Relation Age of Onset   Hypertension Mother     Cancer Son      Social History:  reports that she has quit smoking. She has never used smokeless tobacco. She reports current alcohol use of about 14.0 standard drinks of alcohol per week. She reports that she does not use drugs. Allergies:      Allergies  Allergen Reactions   Penicillins Anaphylaxis   Shellfish Allergy Anaphylaxis, Hives and Swelling   Amoxicillin Rash          Medications Prior to Admission  Medication Sig Dispense Refill   albuterol (PROVENTIL HFA;VENTOLIN HFA) 108 (90 BASE) MCG/ACT inhaler Inhale 2 puffs into the lungs every 6 (six) hours as needed for wheezing or shortness of breath.       albuterol-ipratropium (COMBIVENT) 18-103 MCG/ACT inhaler Inhale 2 puffs into the lungs every 6 (six) hours as needed for shortness of breath. Reported on 04/27/2016       bictegravir-emtricitabine-tenofovir AF  (BIKTARVY) 50-200-25 MG TABS tablet Take 1 tablet by mouth daily. 30 tablet 11   budesonide-formoterol (SYMBICORT) 160-4.5 MCG/ACT inhaler Inhale 2 puffs into the lungs 2 (two) times daily.       cetirizine (ZYRTEC) 10 MG tablet Take 10 mg by mouth daily.       furosemide (LASIX) 40 MG tablet Take 40 mg by mouth daily.       glipiZIDE (GLUCOTROL XL) 10 MG 24 hr tablet Take 10 mg by mouth daily.       LANTUS SOLOSTAR 100 UNIT/ML Solostar Pen Inject 10 Units into the skin at bedtime.       losartan (COZAAR) 50 MG tablet Take 50 mg by mouth daily.       meloxicam (MOBIC) 15 MG tablet Take 15 mg by mouth as needed for pain.       omeprazole (PRILOSEC) 20 MG capsule Take 20 mg by mouth daily.       OZEMPIC, 0.25 OR 0.5 MG/DOSE, 2 MG/3ML SOPN Inject 0.5 mg into the skin once a week.       hydrOXYzine (ATARAX/VISTARIL) 25 MG tablet Take 1 tablet (25 mg total) by mouth 3 (three) times daily as needed for itching. (Patient not taking: Reported on 04/12/2023) 30 tablet 0          Home: Home Living Family/patient expects to be discharged to:: Private residence Living Arrangements: Alone Available Help at Discharge: Family Type of Home: Apartment Home Access: Level entry Home Layout: One level Bathroom Shower/Tub: Engineer, manufacturing systems: Standard Bathroom Accessibility: Yes Home Equipment: Medical laboratory scientific officer - single point  Lives With: Alone   Functional History: Prior Function Prior Level of Function : Independent/Modified Independent Mobility Comments: uses SPC for about 5months, started falling back then   Functional Status:  Mobility: Bed Mobility General bed mobility comments: received sitting in recliner Transfers Overall transfer level: Needs assistance Equipment used: Rolling walker (2 wheels) Transfers: Sit to/from Stand Sit to Stand: Min guard General transfer comment: min assist to stand from chair at sink and min guard to ambulate to recliner with cues for  safety Ambulation/Gait Ambulation/Gait assistance: Mod assist, Min assist, Min guard Gait Distance (Feet): 50 Feet (+ 40' + 20') Assistive device: Rolling walker (2 wheels), Quad cane Gait Pattern/deviations: Step-to pattern, Step-through pattern, Decreased stride length, Decreased dorsiflexion - right, Trunk flexed General Gait Details: slow, guarded gait initially with RW, intermittent minA for stability, cues for sequencing, cues for activity pacing secondary to c/o BLE/BUE fatigue; prolonged standing rest break before additional ambulation. seated rest break then gait trial with quad cane, pt walking ~10' with LOB requiring modA to prevent fall Gait velocity: Decreased Gait velocity interpretation: <1.31 ft/sec, indicative of household ambulator   ADL: ADL Overall ADL's : Needs assistance/impaired Eating/Feeding: Set up, Sitting Eating/Feeding Details (indicate cue type and reason): pt requesting intermittent assistance with meal setup due to frustration  with decreased coordination in RUE Grooming: Wash/dry hands, Wash/dry face, Oral care, Min guard, Standing Grooming Details (indicate cue type and reason): at sink Upper Body Bathing: Set up, Sitting Upper Body Bathing Details (indicate cue type and reason): at sink Lower Body Bathing: Minimal assistance, Sit to/from stand Lower Body Bathing Details (indicate cue type and reason): able to perform peri care standing at sink with use of sink for support. min assist with bathing feet Upper Body Dressing : Min guard, Sitting Upper Body Dressing Details (indicate cue type and reason): changed gown Lower Body Dressing: Moderate assistance, Sitting/lateral leans Lower Body Dressing Details (indicate cue type and reason): to donn socks General ADL Comments: seated at sink peforming bathing upon entry. Patient attempting to stand with feet on towel, instructed on safety and to wear non skid socks when standing   Cognition: Cognition Overall  Cognitive Status: Impaired/Different from baseline Arousal/Alertness: Awake/alert Orientation Level: Oriented X4 Year: 2024 Month: April Day of Week: Correct Attention: Focused, Sustained Focused Attention: Appears intact Sustained Attention: Impaired Sustained Attention Impairment: Verbal complex Memory: Impaired Memory Impairment:  (Immediate: 5/5; delayed: 4/5; with cues: 1/1) Awareness: Impaired Awareness Impairment: Emergent impairment Problem Solving: Impaired Problem Solving Impairment: Verbal complex (money: 1/3) Executive Function: Sequencing, Occupational hygienist: Impaired Sequencing Impairment: Verbal complex (clock: 2/4) Organizing: Impaired Organizing Impairment: Verbal complex (backward digit span: 1/2) Cognition Arousal/Alertness: Awake/alert Behavior During Therapy: WFL for tasks assessed/performed Overall Cognitive Status: Impaired/Different from baseline Area of Impairment: Awareness, Problem solving, Safety/judgement Safety/Judgement: Decreased awareness of safety Awareness: Emergent Problem Solving: Requires verbal cues General Comments: cues for safety when performing self care at sink   Physical Exam: Blood pressure 128/74, pulse 81, temperature 97.9 F (36.6 C), temperature source Oral, resp. rate 18, height  (1.702 m), weight 115.3 kg, SpO2 97 %. Physical Exam Vitals and nursing note reviewed.  Constitutional:      General: She is not in acute distress.    Appearance: She is obese.     Comments: Pt awake, alert, appropriate, stopped coughing after drinking water a few times;  ate 100% tray; needs to void; asked for NT, sitting up in bedside chair, NAD  HENT:     Head: Normocephalic and atraumatic.     Comments: Smile equal, but R facial droop- correct more with smile Tongue midline R face numb/decreased sensation to light touch    Right Ear: External ear normal.     Left Ear: External ear normal.     Nose: Congestion and rhinorrhea present.      Mouth/Throat:     Mouth: Mucous membranes are moist.     Pharynx: Oropharynx is clear. No oropharyngeal exudate.  Eyes:     General:        Right eye: No discharge.        Left eye: No discharge.     Extraocular Movements: Extraocular movements intact.     Comments:  Mild nystagmus B/L  Cardiovascular:     Rate and Rhythm: Normal rate and regular rhythm.     Heart sounds: Normal heart sounds. No murmur heard.    No gallop.  Pulmonary:     Effort: Pulmonary effort is normal. No respiratory distress.     Breath sounds: Normal breath sounds. No wheezing, rhonchi or rales.     Comments: Once stopped coughing, sounded clear to auscultation B/L.  Abdominal:     General: Bowel sounds are normal. There is no distension.     Palpations: Abdomen is soft.  Tenderness: There is no abdominal tenderness.  Musculoskeletal:     Cervical back: Neck supple. No tenderness.     Comments: LUE 5/5 in same muscles RUE- 4/5 proximal; grip 3+/5; FA 2/5 LLE- 5/5 in same muscles RLE- HF 4/5; KE/KF 4-/5; DF/PF 4-/5   Skin:    General: Skin is warm and dry.     Comments: IV L AC fossa- looks OK Trace LE edema to ankles B/L  Neurological:     Mental Status: She is alert.     Comments: Decreased to light touch on R side arm and leg No clonus, but has brisk Hoffman's RUE Difficulty with slurring words Naming intact and repetition intact- just hard to understand  Psychiatric:        Thought Content: Thought content normal.        Judgment: Judgment normal.     Comments: Tearful frequently during interview        Lab Results Last 48 Hours        Results for orders placed or performed during the hospital encounter of 04/11/23 (from the past 48 hour(s))  Glucose, capillary     Status: Abnormal    Collection Time: 04/14/23 12:03 PM  Result Value Ref Range    Glucose-Capillary 300 (H) 70 - 99 mg/dL      Comment: Glucose reference range applies only to samples taken after fasting for at least 8  hours.    Comment 1 Notify RN    Glucose, capillary     Status: Abnormal    Collection Time: 04/14/23  4:34 PM  Result Value Ref Range    Glucose-Capillary 346 (H) 70 - 99 mg/dL      Comment: Glucose reference range applies only to samples taken after fasting for at least 8 hours.    Comment 1 Notify RN    Glucose, capillary     Status: Abnormal    Collection Time: 04/14/23  9:23 PM  Result Value Ref Range    Glucose-Capillary 366 (H) 70 - 99 mg/dL      Comment: Glucose reference range applies only to samples taken after fasting for at least 8 hours.  Glucose, capillary     Status: Abnormal    Collection Time: 04/15/23  6:20 AM  Result Value Ref Range    Glucose-Capillary 284 (H) 70 - 99 mg/dL      Comment: Glucose reference range applies only to samples taken after fasting for at least 8 hours.  Glucose, capillary     Status: Abnormal    Collection Time: 04/15/23 11:32 AM  Result Value Ref Range    Glucose-Capillary 255 (H) 70 - 99 mg/dL      Comment: Glucose reference range applies only to samples taken after fasting for at least 8 hours.  Glucose, capillary     Status: Abnormal    Collection Time: 04/15/23  4:37 PM  Result Value Ref Range    Glucose-Capillary 274 (H) 70 - 99 mg/dL      Comment: Glucose reference range applies only to samples taken after fasting for at least 8 hours.  Glucose, capillary     Status: Abnormal    Collection Time: 04/15/23  9:22 PM  Result Value Ref Range    Glucose-Capillary 209 (H) 70 - 99 mg/dL      Comment: Glucose reference range applies only to samples taken after fasting for at least 8 hours.  Glucose, capillary     Status: Abnormal    Collection Time:  04/16/23  6:23 AM  Result Value Ref Range    Glucose-Capillary 241 (H) 70 - 99 mg/dL      Comment: Glucose reference range applies only to samples taken after fasting for at least 8 hours.      Imaging Results (Last 48 hours)  No results found.         Blood pressure 128/74, pulse 81,  temperature 97.9 F (36.6 C), temperature source Oral, resp. rate 18, height 5\' 7"  (1.702 m), weight 115.3 kg, SpO2 97 %.   Medical Problem List and Plan: 1. 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  Antithrombotics: -DVT/anticoagulation:  Pharmaceutical: Lovenox             -antiplatelet therapy: Aspirin and Plavix for three weeks followed by aspirin alone   3. Pain Management: Tylenol as needed   4. Mood/Behavior/Sleep: LCSW to evaluate and provide emotional support             -Depression>The current medical regimen is effective;  continue present plan and medications. Lexapro was old med- since didn't stay on it, not sure if wasn't helpful- will try Celexa 20 mg daily             -antipsychotic agents: n/a   5. Neuropsych/cognition: This patient is capable of making decisions on her own behalf.   6. Skin/Wound Care: Routine skin care checks   7. Fluids/Electrolytes/Nutrition: Routine Is and Os and follow-up chemistries             -carb modified diet   8: Hypertension: monitor TID and prn (home Lasix not restarted)             -continue losartan 50 mg daily   9: Hyperlipidemia: continue statin   10: HIV: continue Biktarvy   11: DM-2: A1c 8.0%; CBGs four times daily; (home meds include Lantus Solostar, Glucotrol XL and Ozempic)             -continue SSI             -continue Semglee 12 units BID             -continue meal coverage 5 units TID   12: Moderate persistent asthma: continue Dulera   13: Allergic rhinitis: continue Claritin             -start Flonase   14: Hyponatremia: follow-up BMP   15: Stress urinary incontinence             Milinda Antis, PA-C 04/16/2023   I have personally performed a face to face diagnostic evaluation of this patient and formulated the key components of the plan.  Additionally, I have personally reviewed  laboratory data, imaging studies, as well as relevant notes and concur with the physician assistant's documentation above.   The patient's status has not changed from the original H&P.  Any changes in documentation from the acute care chart have been noted above.

## 2023-04-16 NOTE — TOC Transition Note (Signed)
Transition of Care [facility]) - CM/SW Discharge Note   Patient Details  Name: Latoya Roth MRN: [id number] Date of Birth: [birth year]  Transition of Care [facility]) CM/SW Contact:  Kermit Balo, RN Phone Number: 04/16/2023, 10:47 AM   Clinical Narrative:    Pt is discharging to CIR today. CM signing off.    Final next level of care: IP Rehab Facility Barriers to Discharge: No Barriers Identified   Patient Goals and CMS Choice      Discharge Placement                         Discharge Plan and Services Additional resources added to the After Visit Summary for                                       Social Determinants of Health (SDOH) Interventions SDOH Screenings   Food Insecurity: Food Insecurity Present (04/11/2023)  Housing: Medium Risk (04/11/2023)  Transportation Needs: No Transportation Needs (04/11/2023)  Utilities: Not At Risk (04/11/2023)  Depression (PHQ2-9): Low Risk  (03/21/2023)  Tobacco Use: Medium Risk (04/12/2023)     Readmission Risk Interventions     No data to display

## 2023-04-16 NOTE — Progress Notes (Signed)
Inpatient Rehabilitation Center Individual Statement of Services  Patient Name:  Latoya Roth  Date:  04/16/2023  Welcome to the Inpatient Rehabilitation Center.  Our goal is to provide you with an individualized program based on your diagnosis and situation, designed to meet your specific needs.  With this comprehensive rehabilitation program, you will be expected to participate in at least 3 hours of rehabilitation therapies Monday-Friday, with modified therapy programming on the weekends.  Your rehabilitation program will include the following services:  Physical Therapy (PT), Occupational Therapy (OT), Speech Therapy (ST), 24 hour per day rehabilitation nursing, Therapeutic Recreaction (TR), Neuropsychology, Care Coordinator, Rehabilitation Medicine, Nutrition Services, Pharmacy Services, and Other  Weekly team conferences will be held on Wednesdays to discuss your progress.  Your Inpatient Rehabilitation Care Coordinator will talk with you frequently to get your input and to update you on team discussions.  Team conferences with you and your family in attendance may also be held.  Expected length of stay:  7-10 Days  Overall anticipated outcome:  Supervision  Depending on your progress and recovery, your program may change. Your Inpatient Rehabilitation Care Coordinator will coordinate services and will keep you informed of any changes. Your Inpatient Rehabilitation Care Coordinator's name and contact numbers are listed  below.  The following services may also be recommended but are not provided by the Inpatient Rehabilitation Center:   Home Health Rehabiltiation Services Outpatient Rehabilitation Services    Arrangements will be made to provide these services after discharge if needed.  Arrangements include referral to agencies that provide these services.  Your insurance has been verified to be:   [place] MEDICAID Your primary doctor is:  Osei-Bonsu, Greggory Stallion, MD  Pertinent  information will be shared with your doctor and your insurance company.  Inpatient Rehabilitation Care Coordinator:  Dossie Der, [place] [phone] or Luna [place]  Information discussed with and copy given to patient by: Andria Rhein, 04/16/2023, 2:13 PM

## 2023-04-16 NOTE — Progress Notes (Signed)
Inpatient Rehabilitation Admission Medication Review by a Pharmacist  A complete drug regimen review was completed for this patient to identify any potential clinically significant medication issues.  High Risk Drug Classes Is patient taking? Indication by Medication  Antipsychotic No   Anticoagulant Yes Lovenox - DVT px  Antibiotic Yes Biktarvy - HIV  Opioid No   Antiplatelet Yes ASA/plavix x3 wks (5/9) then ASA alone- CVA  Hypoglycemics/insulin Yes SSI/semglee - DM  Vasoactive Medication Yes Losartan - HTN  Chemotherapy No   Other Yes Robaxin - spasms Trazodone - sleep Claritin - allergy Melatonin - sleep Crestor - HLD Dulera - asthma     Type of Medication Issue Identified Description of Issue Recommendation(s)  Drug Interaction(s) (clinically significant)     Duplicate Therapy     Allergy     No Medication Administration End Date     Incorrect Dose     Additional Drug Therapy Needed     Significant med changes from prior encounter (inform family/care partners about these prior to discharge).    Other  Albuterol, comibivent, glipizide, lasix, pantoprazole, ozempic, lantus Resume if needed at dc    Clinically significant medication issues were identified that warrant physician communication and completion of prescribed/recommended actions by midnight of the next day:  No  Name of provider notified for urgent issues identified:   Provider Method of Notification:     Pharmacist comments:   Time spent performing this drug regimen review (minutes):  20   Ulyses Southward, PharmD, [place], AAHIVP, CPP Infectious Disease Pharmacist 04/16/2023 1:38 PM

## 2023-04-16 NOTE — Progress Notes (Signed)
Inpatient Rehabilitation Admissions Coordinator   I have insurance approval and CIR bed to admit her to today. I met at bedside with patient and will make the arrangements to admit. Patient is in agreement. Acute team and TOC made aware.  Ottie Glazier, RN, MSN Rehab Admissions Coordinator [phone] 04/16/2023 10:21 AM

## 2023-04-16 NOTE — Progress Notes (Signed)
Pt's home med, Jeffie Pollock, was taken to pharmacy.  According to the expiration date on the box the medication has expired.

## 2023-04-16 NOTE — Discharge Summary (Signed)
Physician Discharge Summary  Patient ID: [name] MRN: [id number] DOB/AGE: 1971/02/04 52 y.o.  Admit date: 04/16/2023 Discharge date: 04/24/2023  Discharge Diagnoses:  Principal Problem:   Acute brainstem infarction Active problems: Functional deficits secondary to acute brainstem infarction Asthma Depression DM HIV disease Hypertension Obesity Stress urinary incontinence Hyponatremia Allergic rhinitis Hypoalbuminemia Bilateral knee instability   Discharged Condition: stable  Significant Diagnostic Studies: none  Labs:  Basic Metabolic Panel: Recent Labs  Lab 04/11/23 1606 04/11/23 1618 04/12/23 0100 04/14/23 0304  NA 135 135 131* 132*  K 3.6 3.7 3.7 3.8  CL 95* 97* 96* 95*  CO2 27  --  27 28  GLUCOSE 197* 202* 271* 329*  BUN 10 11 11 12   CREATININE 0.81 0.70 0.95 0.76  CALCIUM 9.5  --  9.0 8.9  MG  --   --  1.8  1.8 1.7    CBC: Recent Labs  Lab 04/11/23 1606 04/11/23 1618 04/12/23 0100 04/14/23 0304  WBC 7.7  --  6.6 6.2  NEUTROABS 4.0  --  3.4  --   HGB 13.9 15.3* 13.5 12.0  HCT 42.2 45.0 41.1 38.2  MCV 80.8  --  81.4 83.0  PLT 414*  --  378 336    CBG: Recent Labs  Lab 04/15/23 1637 04/15/23 2122 04/16/23 0623 04/16/23 1122 04/16/23 1632  GLUCAP 274* 209* 241* 215* 233*    Brief HPI:   Latoya Roth is a 52 y.o. female who presented to the ED on 04/11/2023 complaining of right-sided weakness and right-facial droop that started four days prior to presentation. Head CT obtained with strong suspicion for a pontine infarct which was followed by an MRI that confirmed a subacute left paramedian pons infarct. Neurology was consulted for stroke workup. Does not use tobacco. Drinks about 2 alcoholic servings per day. Medical history significant for hypertension, HIV on HARRT therapy, DM-2. Vitals were relatively stable. Labs showed sodium slightly low at 135. Urine drug screen was negative. Urinalysis was negative as well. EKG showed  normal sinus rhythm. Hgb A1c 8.0%. LDL 92. 2D echo 55-60%. Plavix and aspirin started. Continue for 3 weeks then aspirin alone. Diabetic coordinator consulted. Lovenox for DVT prophy. She is requiring mod A to prevent loss of balance. Tolerating carb modified diet.    Hospital Course: Latoya Roth was admitted to rehab 04/16/2023 for inpatient therapies to consist of PT, ST and OT at least three hours five days a week. Past admission physiatrist, therapy team and rehab RN have worked together to provide customized collaborative inpatient rehab. Started on Celexa 20 mg daily for situational depression. Labs on 4/24 sodium 131, mag 1.7. CBC normal. Timed voids started for urinary incontinence. Bilateral knee instability: knee sleeves ordered. 4/29 C/o left lower extremity swelling- vas [place] ordered and reviewed and is negative for clots, SCDs ordered to improve circulation, advised ice and elevating. Asthma controlled.    Blood pressures were monitored on TID basis and losartan 50 mg daily continued. Given magnesium 2 grams IV on 4/25. Magnesium gluconate 250 mg started at bedtime on 4/29.  Diabetes has been monitored with ac/hs CBG checks and SSI was use prn for tighter BS control. Semglee 12 units BID and meal coverage 5 units TID continued. Increased Semglee to 14 units BID on 4/27.   Rehab course: During patient's stay in rehab weekly team conferences were held to monitor patient's progress, set goals and discuss barriers to discharge. At admission, patient required min with mobility and mod  with basic self-care skills.    She  has had improvement in activity tolerance, balance, postural control as well as ability to compensate for deficits. She has had improvement in functional use RUE/LUE  and RLE/LLE as well as improvement in awareness. 4/30 . Patient completed stand pivot transfer with supervision and no AD to wheelchair. Propelled herself in w/c ~161ft with supervision using BUE - difficulty  maintaining straight path due to RUE weakness.        Disposition:  There are no questions and answers to display.         Diet:  Special Instructions: No driving, alcohol consumption or tobacco use.  Plavix and aspirin therapy for 3 weeks followed by aspirin alone. DAPT started 04/12/2023. Last dose of Plavix will be on 05/02/2023.   Allergies as of 04/16/2023       Reactions   Penicillins Anaphylaxis   Shellfish Allergy Anaphylaxis, Hives, Swelling   Amoxicillin Rash     Med Rec must be completed prior to using this [facility]***        Signed: Milinda Antis 04/16/2023, 4:49 PM

## 2023-04-16 NOTE — Progress Notes (Signed)
Genice Rouge, MD  Physician Physical Medicine and Rehabilitation   PMR Pre-admission     Signed   Date of Service: 04/13/2023  2:08 PM  Related encounter: ED to Hosp-Admission (Discharged) from 04/11/2023 in [facility]   Signed      Show:Clear all Written[x] Templated[] Copied  Added by: Standley Brooking, RN[x] Theodoro Kos, Lauren Demetrius Charity, CCC-SLP[x] Genice Rouge, MD  Hover for details PMR Admission Coordinator Pre-Admission Assessment   Patient: Latoya Roth is an 52 y.o., female MRN: [id number] DOB: January 18, 1971 Height:  (170.2 cm) Weight: 115.3 kg   Insurance Information HMO:     PPO:      PCP:      IPA:      80/20:      OTHER:  PRIMARY: [place] Medicaid [employer]       Policy#: [id number]      Subscriber: patient CM Name: via fax      Phone#: [phone]     Fax#: [phone] Pre-Cert#: [id number]  approved 4/23 until 04/23/23    Employer:  Benefits:  Phone #: [phone]     Name:  Eff. Date: 11/23/21-12/23/2198     Deduct:       Out of Pocket Max:       Life Max:  CIR: 100% coverage      SNF:  Outpatient:      Co-Pay:  Home Health:       Co-Pay:  DME:      Co-Pay:  Providers: in-network SECONDARY:       Policy#:      Phone#:    Artist:       Phone#:    The Data processing manager" for patients in Inpatient Rehabilitation Facilities with attached "Privacy Act Statement-Health Care Records" was provided and verbally reviewed with: N/A   Emergency Contact Information Contact Information       Name Relation Home Work Mobile    [place] Son [phone]        [name]     [phone]         Current Medical History  Patient Admitting Diagnosis: CVA   History of Present Illness: Pt is a 52 year old female with medical hx significant for: obesity, HIV, DM II, essential HTN, Bell's palsy, former smoker. Pt presented to [facility] on 04/11/23 d/t right-sided weakness  and right-sided facial droop that started 4 days prior to presentation. Pt reported presenting to an outside hospital for similar symptoms on 04/09/23 and being discharged. Pt not a candidate for tPA. CT head concerning for pontine infarct. 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 No evidence of hemorrhage or mass effect/midline shift. CTA head and neck unremarkable. 2D Echo showed EF 55-60%.  Therapy evaluations completed and CIR recommended d/t pt's deficits in functional mobility and inability to complete ADLs independently.    Complete NIHSS TOTAL: 6   Patient's medical record from [facility] has been reviewed by the rehabilitation admission coordinator and physician.   Past Medical History      Past Medical History:  Diagnosis Date   Anxiety     Arthritis     Asthma     Bell's palsy     BV (bacterial vaginosis)     Depression     Diabetes mellitus     HIV disease     Hypertension     Obesity      Has the patient had major surgery during 100 days prior  to admission? No   Family History   family history includes Cancer in her son; Hypertension in her mother.   Current Medications   Current Facility-Administered Medications:    acetaminophen (TYLENOL) tablet 650 mg, 650 mg, Oral, Q6H PRN, 650 mg at 04/15/23 1033 **OR** acetaminophen (TYLENOL) suppository 650 mg, 650 mg, Rectal, Q6H PRN, Howerter, Justin B, DO   albuterol (PROVENTIL) (2.5 MG/3ML) 0.083% nebulizer solution 2.5 mg, 2.5 mg, Nebulization, Q4H PRN, Howerter, Justin B, DO   aspirin EC tablet 81 mg, 81 mg, Oral, Daily, Pokhrel, Laxman, MD, 81 mg at 04/16/23 1012   bictegravir-emtricitabine-tenofovir AF (BIKTARVY) 50-200-25 MG per tablet 1 tablet, 1 tablet, Oral, Daily, Howerter, Justin B, DO, 1 tablet at 04/16/23 1012   clopidogrel (PLAVIX) tablet 75 mg, 75 mg, Oral, Daily, Pokhrel, Laxman, MD, 75 mg at 04/16/23 1012   enoxaparin (LOVENOX) injection 40 mg, 40 mg, Subcutaneous, Q24H, Roda Shutters, Jindong, MD, 40  mg at 04/15/23 2209   insulin aspart (novoLOG) injection 0-20 Units, 0-20 Units, Subcutaneous, TID WC, Hall, Carole N, DO, 7 Units at 04/16/23 0850   insulin aspart (novoLOG) injection 0-5 Units, 0-5 Units, Subcutaneous, QHS, Hall, Carole N, DO, 2 Units at 04/15/23 2207   insulin aspart (novoLOG) injection 5 Units, 5 Units, Subcutaneous, TID WC, Pokhrel, Laxman, MD, 5 Units at 04/16/23 0848   insulin glargine-yfgn (SEMGLEE) injection 12 Units, 12 Units, Subcutaneous, BID, Pokhrel, Laxman, MD, 12 Units at 04/16/23 1012   labetalol (NORMODYNE) injection 10 mg, 10 mg, Intravenous, Q2H PRN, Howerter, Justin B, DO   lip balm (CARMEX) ointment, , Topical, PRN, Pokhrel, Laxman, MD, Given at 04/13/23 1200   loratadine (CLARITIN) tablet 10 mg, 10 mg, Oral, Daily, Howerter, Justin B, DO, 10 mg at 04/16/23 1012   losartan (COZAAR) tablet 50 mg, 50 mg, Oral, Daily, Marvel Plan, MD, 50 mg at 04/16/23 1012   melatonin tablet 3 mg, 3 mg, Oral, QHS PRN, Howerter, Justin B, DO, 3 mg at 04/15/23 2213   mometasone-formoterol (DULERA) 200-5 MCG/ACT inhaler 2 puff, 2 puff, Inhalation, BID, Howerter, Justin B, DO, 2 puff at 04/16/23 0827   ondansetron (ZOFRAN) injection 4 mg, 4 mg, Intravenous, Q6H PRN, Howerter, Justin B, DO   rosuvastatin (CRESTOR) tablet 20 mg, 20 mg, Oral, Daily, Marvel Plan, MD, 20 mg at 04/16/23 1012   Patients Current Diet:  Diet Order                  Diet Carb Modified             Diet Carb Modified Fluid consistency: Thin; Room service appropriate? Yes  Diet effective now                       Precautions / Restrictions Precautions Precautions: Fall Restrictions Weight Bearing Restrictions: No    Has the patient had 2 or more falls or a fall with injury in the past year? No   Prior Activity Level Limited Community (1-2x/wk): MD appointments   Prior Functional Level Self Care: Did the patient need help bathing, dressing, using the toilet or eating? Independent   Indoor  Mobility: Did the patient need assistance with walking from room to room (with or without device)? Independent   Stairs: Did the patient need assistance with internal or external stairs (with or without device)? Independent (with supervision)   Functional Cognition: Did the patient need help planning regular tasks such as shopping or remembering to take medications? Independent   Patient Information  Are you of Hispanic, Latino/a,or Spanish origin?: A. No, not of Hispanic, Latino/a, or Spanish origin What is your race?: B. Black or African American Do you need or want an interpreter to communicate with a doctor or health care staff?: 0. No   Patient's Response To:  Health Literacy and Transportation Is the patient able to respond to health literacy and transportation needs?: Yes Health Literacy - How often do you need to have someone help you when you read instructions, pamphlets, or other written material from your doctor or pharmacy?: Never In the past 12 months, has lack of transportation kept you from medical appointments or from getting medications?: No In the past 12 months, has lack of transportation kept you from meetings, work, or from getting things needed for daily living?: No   Home Assistive Devices / Equipment Home Assistive Devices/Equipment: Medical laboratory scientific officer (specify quad or straight) (quad cane) Home Equipment: Cane - single point   Prior Device Use: Indicate devices/aids used by the patient prior to current illness, exacerbation or injury?  cane   Current Functional Level Cognition   Arousal/Alertness: Awake/alert Overall Cognitive Status: Impaired/Different from baseline Orientation Level: Oriented X4 Safety/Judgement: Decreased awareness of safety General Comments: cues for safety when performing self care at sink Attention: Focused, Sustained Focused Attention: Appears intact Sustained Attention: Impaired Sustained Attention Impairment: Verbal complex Memory:  Impaired Memory Impairment:  (Immediate: 5/5; delayed: 4/5; with cues: 1/1) Awareness: Impaired Awareness Impairment: Emergent impairment Problem Solving: Impaired Problem Solving Impairment: Verbal complex (money: 1/3) Executive Function: Sequencing, Occupational hygienist: Impaired Sequencing Impairment: Verbal complex (clock: 2/4) Organizing: Impaired Organizing Impairment: Verbal complex (backward digit span: 1/2)    Extremity Assessment (includes Sensation/Coordination)   Upper Extremity Assessment: RUE deficits/detail RUE Deficits / Details: grossly 3+/5pt reports sensation is intact, but demonstrates decreased coordination and strength. Pt attempting to use hand during bimanual tasks with meals. Provided pt with fine motor coordination HEP and level 1 theraputty and educated on HEP; RUE Coordination: decreased fine motor, decreased gross motor  Lower Extremity Assessment: Defer to PT evaluation RLE Deficits / Details: hip flexion 3+/5, knee ext/flex 3+/5, 3-/5 dorsiflexion. pt unable to complete heel to shin 2/2 weakness RLE Sensation: WNL RLE Coordination: decreased gross motor, decreased fine motor LLE Deficits / Details: 4/5 for hip flexion, 4+/5 for knee ext/flex, dorsiflexion/plantar flexoin LLE Sensation: WNL LLE Coordination: WNL     ADLs   Overall ADL's : Needs assistance/impaired Eating/Feeding: Set up, Sitting Eating/Feeding Details (indicate cue type and reason): pt requesting intermittent assistance with meal setup due to frustration with decreased coordination in RUE Grooming: Wash/dry hands, Wash/dry face, Oral care, Min guard, Standing Grooming Details (indicate cue type and reason): at sink Upper Body Bathing: Set up, Sitting Upper Body Bathing Details (indicate cue type and reason): at sink Lower Body Bathing: Minimal assistance, Sit to/from stand Lower Body Bathing Details (indicate cue type and reason): able to perform peri care standing at sink with use of  sink for support. min assist with bathing feet Upper Body Dressing : Min guard, Sitting Upper Body Dressing Details (indicate cue type and reason): changed gown Lower Body Dressing: Moderate assistance, Sitting/lateral leans Lower Body Dressing Details (indicate cue type and reason): to donn socks General ADL Comments: seated at sink peforming bathing upon entry. Patient attempting to stand with feet on towel, instructed on safety and to wear non skid socks when standing     Mobility   General bed mobility comments: received sitting in recliner  Transfers   Overall transfer level: Needs assistance Equipment used: Rolling walker (2 wheels) Transfers: Sit to/from Stand Sit to Stand: Min guard General transfer comment: min assist to stand from chair at sink and min guard to ambulate to recliner with cues for safety     Ambulation / Gait / Stairs / Wheelchair Mobility   Ambulation/Gait Ambulation/Gait assistance: Mod assist, Min assist, Min guard Gait Distance (Feet): 50 Feet (+ 40' + 20') Assistive device: Rolling walker (2 wheels), Quad cane Gait Pattern/deviations: Step-to pattern, Step-through pattern, Decreased stride length, Decreased dorsiflexion - right, Trunk flexed General Gait Details: slow, guarded gait initially with RW, intermittent minA for stability, cues for sequencing, cues for activity pacing secondary to c/o BLE/BUE fatigue; prolonged standing rest break before additional ambulation. seated rest break then gait trial with quad cane, pt walking ~10' with LOB requiring modA to prevent fall Gait velocity: Decreased Gait velocity interpretation: <1.31 ft/sec, indicative of household ambulator     Posture / Balance Dynamic Sitting Balance Sitting balance - Comments: able to sit without UE support and perform pericare on toilet, no significant weight shift to do so Balance Overall balance assessment: Needs assistance Sitting-balance support: Feet supported Sitting  balance-Leahy Scale: Fair Sitting balance - Comments: able to sit without UE support and perform pericare on toilet, no significant weight shift to do so Standing balance support: Single extremity supported, Bilateral upper extremity supported, During functional activity, No upper extremity supported Standing balance-Leahy Scale: Poor Standing balance comment: LOB standing without UE support     Special needs/care consideration Diabetic management Novolog 5 units 3x daily with meals; Novolog 0-5 units daily at bedtime; Novolog 0-20 units 3x daily with meals and Bladder incontinence    Previous Home Environment  Living Arrangements: Alone  Lives With: Alone Available Help at Discharge: Family Type of Home: Apartment Home Layout: One level Home Access: Level entry Bathroom Shower/Tub: Engineer, manufacturing systems: Standard Bathroom Accessibility: Yes How Accessible: Accessible via walker Home Care Services: No   Discharge Living Setting Plans for Discharge Living Setting: Patient's home Type of Home at Discharge: Apartment Discharge Home Layout: One level Discharge Home Access: Level entry Discharge Bathroom Shower/Tub: Tub/shower unit Discharge Bathroom Toilet: Standard Discharge Bathroom Accessibility: Yes How Accessible: Accessible via walker Does the patient have any problems obtaining your medications?: No   Social/Family/Support Systems Anticipated Caregiver: Brooks Sailors, pt's godsister Anticipated Caregiver's Contact Information: [phone] Caregiver Availability: 24/7 Discharge Plan Discussed with Primary Caregiver: Yes Is Caregiver In Agreement with Plan?: Yes Does Caregiver/Family have Issues with Lodging/Transportation while Pt is in Rehab?: No   Goals Patient/Family Goal for Rehab: Supervision:PT/OT Expected length of stay: 7-10 days Pt/Family Agrees to Admission and willing to participate: Yes Program Orientation Provided & Reviewed with Pt/Caregiver  Including Roles  & Responsibilities: Yes   Decrease burden of Care through IP rehab admission: NA   Possible need for SNF placement upon discharge: Not anticipated   Patient Condition: I have reviewed medical records from [facility], spoken with CM, and patient and godsister. I met with patient at the bedside and discussed via phone for inpatient rehabilitation assessment.  Patient will benefit from ongoing PT and OT, can actively participate in 3 hours of therapy a day 5 days of the week, and can make measurable gains during the admission.  Patient will also benefit from the coordinated team approach during an Inpatient Acute Rehabilitation admission.  The patient will receive intensive therapy as well as Rehabilitation physician, nursing, social worker,  and care management interventions.  Due to bladder management, safety, disease management, medication administration, pain management, and patient education the patient requires 24 hour a day rehabilitation nursing.  The patient is currently min to mod assist overall with mobility and basic ADLs.  Discharge setting and therapy post discharge at home with home health is anticipated.  Patient has agreed to participate in the Acute Inpatient Rehabilitation Program and will admit today.   Preadmission Screen Completed By:  Domingo Pulse, 04/16/2023 10:41 AM ______________________________________________________________________   Discussed status with Dr. Berline Chough on 04/16/23 at 1040 and received approval for admission today.   Admission Coordinator:  Jacoba Cherney, CCC-SLP, time 1040 Date 04/16/23    Assessment/Plan: Diagnosis: Does the need for close, 24 hr/day Medical supervision in concert with the patient's rehab needs make it unreasonable for this patient to be served in a less intensive setting? Yes Co-Morbidities requiring supervision/potential complications: DM, A1c 8.0; Bmi 39.81; L pons acute infarct; HTN/HLD,  asthma Due to bladder management, bowel management, safety, skin/wound care, disease management, medication administration, pain management, and patient education, does the patient require 24 hr/day rehab nursing? Yes Does the patient require coordinated care of a physician, rehab nurse, PT, OT, and SLP to address physical and functional deficits in the context of the above medical diagnosis(es)? Yes Addressing deficits in the following areas: balance, endurance, locomotion, strength, transferring, bowel/bladder control, bathing, dressing, feeding, grooming, and toileting Can the patient actively participate in an intensive therapy program of at least 3 hrs of therapy 5 days a week? Yes The potential for patient to make measurable gains while on inpatient rehab is good Anticipated functional outcomes upon discharge from inpatient rehab: supervision PT, supervision OT, n/a SLP Estimated rehab length of stay to reach the above functional goals is: 7-10 days Anticipated discharge destination: Home 10. Overall Rehab/Functional Prognosis: good     MD Signature:           Revision History

## 2023-04-16 NOTE — H&P (Signed)
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 Head CT obtained with strong suspicion for a pontine infarct which was followed by an MRI that confirmed a subacute left paramedian pons infarct. Neurology was consulted for stroke workup. Does not use tobacco. Drinks about 2 alcoholic servings per day. Medical history significant for hypertension, HIV on HARRT therapy, DM-2. Vitals were relatively stable.  Labs showed sodium slightly low at 135.  Urine drug screen was negative.  Urinalysis was negative as well.  EKG showed normal sinus rhythm. Hgb A1c 8.0%. LDL 92. 2D echo 55-60%. Plavix and aspirin started. Continue for 3 weeks then aspirin alone. Diabetic coordinator consulted. Lovenox for DVT prophy. She is requiring mod A to prevent loss of balance. Tolerating carb modified diet. The patient requires inpatient medicine and rehabilitation evaluations and services for ongoing dysfunction secondary to left pons infarct.   Uses quad cane at home.   Pt reports having a lot of sinus drainage- which sounds like it makes her asthma worse.  Has taken something for mood in past, not lately- feels like needs something. When I entered room, coughing- something went down 'wrong pipe"- doesn't usually do this, even since stroke.  Using toilet/.BSC for voiding- uses pull ups at home/briefs due to incontinence issues- stress and urgency.  LBM yesterday C/O slurred words.    Review of Systems  HENT:  Positive for congestion. Negative for nosebleeds and sore throat.        Complaining of post-nasal drip  Eyes:  Negative for blurred vision and double vision.  Respiratory:  Positive for cough and wheezing. Negative for shortness of breath.    Cardiovascular:  Positive for leg swelling. Negative for chest pain and orthopnea.  Gastrointestinal:  Negative for constipation, nausea and vomiting.  Genitourinary:  Positive for urgency. Negative for dysuria.       Complaining of stress urinary incontinence- has chronically  Musculoskeletal:  Negative for back pain, joint pain and neck pain.  Neurological:  Positive for tingling, sensory change, speech change and focal weakness. Negative for tremors and headaches.  Endo/Heme/Allergies: Negative.   Psychiatric/Behavioral:  Positive for depression. Negative for hallucinations, substance abuse and suicidal ideas. The patient has insomnia.   All other systems reviewed and are negative.  Past Medical History:  Diagnosis Date   Anxiety    Arthritis    Asthma    Bell's palsy    BV (bacterial vaginosis)    Depression    Diabetes mellitus    HIV disease    Hypertension    Obesity    Past Surgical History:  Procedure Laterality Date   CHOLECYSTECTOMY     Family History  Problem Relation Age of Onset   Hypertension Mother    Cancer Son    Social History:  reports that she has quit smoking. She has never used smokeless tobacco. She reports current alcohol use of about 14.0 standard drinks of alcohol per week. She reports that she does not use drugs. Allergies:  Allergies  Allergen Reactions   Penicillins Anaphylaxis   Shellfish Allergy Anaphylaxis, Hives and Swelling   Amoxicillin Rash   Medications Prior to Admission  Medication Sig Dispense Refill   albuterol (PROVENTIL HFA;VENTOLIN HFA) 108 (90 BASE) MCG/ACT inhaler Inhale 2 puffs into  the lungs every 6 (six) hours as needed for wheezing or shortness of breath.     albuterol-ipratropium (COMBIVENT) 18-103 MCG/ACT inhaler Inhale 2 puffs into the lungs every 6 (six) hours as needed for shortness of breath. Reported on 04/27/2016     bictegravir-emtricitabine-tenofovir AF (BIKTARVY) 50-200-25 MG TABS tablet Take 1 tablet by mouth  daily. 30 tablet 11   budesonide-formoterol (SYMBICORT) 160-4.5 MCG/ACT inhaler Inhale 2 puffs into the lungs 2 (two) times daily.     cetirizine (ZYRTEC) 10 MG tablet Take 10 mg by mouth daily.     furosemide (LASIX) 40 MG tablet Take 40 mg by mouth daily.     glipiZIDE (GLUCOTROL XL) 10 MG 24 hr tablet Take 10 mg by mouth daily.     LANTUS SOLOSTAR 100 UNIT/ML Solostar Pen Inject 10 Units into the skin at bedtime.     losartan (COZAAR) 50 MG tablet Take 50 mg by mouth daily.     meloxicam (MOBIC) 15 MG tablet Take 15 mg by mouth as needed for pain.     omeprazole (PRILOSEC) 20 MG capsule Take 20 mg by mouth daily.     OZEMPIC, 0.25 OR 0.5 MG/DOSE, 2 MG/3ML SOPN Inject 0.5 mg into the skin once a week.     hydrOXYzine (ATARAX/VISTARIL) 25 MG tablet Take 1 tablet (25 mg total) by mouth 3 (three) times daily as needed for itching. (Patient not taking: Reported on 04/12/2023) 30 tablet 0      Home: Home Living Family/patient expects to be discharged to:: Private residence Living Arrangements: Alone Available Help at Discharge: Family Type of Home: Apartment Home Access: Level entry Home Layout: One level Bathroom Shower/Tub: Engineer, manufacturing systems: Standard Bathroom Accessibility: Yes Home Equipment: Medical laboratory scientific officer - single point  Lives With: Alone   Functional History: Prior Function Prior Level of Function : Independent/Modified Independent Mobility Comments: uses SPC for about 5months, started falling back then  Functional Status:  Mobility: Bed Mobility General bed mobility comments: received sitting in recliner Transfers Overall transfer level: Needs assistance Equipment used: Rolling walker (2 wheels) Transfers: Sit to/from Stand Sit to Stand: Min guard General transfer comment: min assist to stand from chair at sink and min guard to ambulate to recliner with cues for safety Ambulation/Gait Ambulation/Gait assistance: Mod assist, Min assist, Min guard Gait Distance  (Feet): 50 Feet (+ 40' + 20') Assistive device: Rolling walker (2 wheels), Quad cane Gait Pattern/deviations: Step-to pattern, Step-through pattern, Decreased stride length, Decreased dorsiflexion - right, Trunk flexed General Gait Details: slow, guarded gait initially with RW, intermittent minA for stability, cues for sequencing, cues for activity pacing secondary to c/o BLE/BUE fatigue; prolonged standing rest break before additional ambulation. seated rest break then gait trial with quad cane, pt walking ~10' with LOB requiring modA to prevent fall Gait velocity: Decreased Gait velocity interpretation: <1.31 ft/sec, indicative of household ambulator    ADL: ADL Overall ADL's : Needs assistance/impaired Eating/Feeding: Set up, Sitting Eating/Feeding Details (indicate cue type and reason): pt requesting intermittent assistance with meal setup due to frustration with decreased coordination in RUE Grooming: Wash/dry hands, Wash/dry face, Oral care, Min guard, Standing Grooming Details (indicate cue type and reason): at sink Upper Body Bathing: Set up, Sitting Upper Body Bathing Details (indicate cue type and reason): at sink Lower Body Bathing: Minimal assistance, Sit to/from stand Lower Body Bathing Details (indicate cue type and reason): able to perform peri care standing at sink with use of sink for support. min assist with bathing feet  Upper Body Dressing : Min guard, Sitting Upper Body Dressing Details (indicate cue type and reason): changed gown Lower Body Dressing: Moderate assistance, Sitting/lateral leans Lower Body Dressing Details (indicate cue type and reason): to donn socks General ADL Comments: seated at sink peforming bathing upon entry. Patient attempting to stand with feet on towel, instructed on safety and to wear non skid socks when standing  Cognition: Cognition Overall Cognitive Status: Impaired/Different from baseline Arousal/Alertness: Awake/alert Orientation Level:  Oriented X4 Year: 2024 Month: April Day of Week: Correct Attention: Focused, Sustained Focused Attention: Appears intact Sustained Attention: Impaired Sustained Attention Impairment: Verbal complex Memory: Impaired Memory Impairment:  (Immediate: 5/5; delayed: 4/5; with cues: 1/1) Awareness: Impaired Awareness Impairment: Emergent impairment Problem Solving: Impaired Problem Solving Impairment: Verbal complex (money: 1/3) Executive Function: Sequencing, Occupational hygienist: Impaired Sequencing Impairment: Verbal complex (clock: 2/4) Organizing: Impaired Organizing Impairment: Verbal complex (backward digit span: 1/2) Cognition Arousal/Alertness: Awake/alert Behavior During Therapy: WFL for tasks assessed/performed Overall Cognitive Status: Impaired/Different from baseline Area of Impairment: Awareness, Problem solving, Safety/judgement Safety/Judgement: Decreased awareness of safety Awareness: Emergent Problem Solving: Requires verbal cues General Comments: cues for safety when performing self care at sink  Physical Exam: Blood pressure 128/74, pulse 81, temperature 97.9 F (36.6 C), temperature source Oral, resp. rate 18, height 5\' 7"  (1.702 m), weight 115.3 kg, SpO2 97 %. Physical Exam Vitals and nursing note reviewed.  Constitutional:      General: She is not in acute distress.    Appearance: She is obese.     Comments: Pt awake, alert, appropriate, stopped coughing after drinking water a few times;  ate 100% tray; needs to void; asked for NT, sitting up in bedside chair, NAD  HENT:     Head: Normocephalic and atraumatic.     Comments: Smile equal, but R facial droop- correct more with smile Tongue midline R face numb/decreased sensation to light touch    Right Ear: External ear normal.     Left Ear: External ear normal.     Nose: Congestion and rhinorrhea present.     Mouth/Throat:     Mouth: Mucous membranes are moist.     Pharynx: Oropharynx is clear. No  oropharyngeal exudate.  Eyes:     General:        Right eye: No discharge.        Left eye: No discharge.     Extraocular Movements: Extraocular movements intact.     Comments:  Mild nystagmus B/L  Cardiovascular:     Rate and Rhythm: Normal rate and regular rhythm.     Heart sounds: Normal heart sounds. No murmur heard.    No gallop.  Pulmonary:     Effort: Pulmonary effort is normal. No respiratory distress.     Breath sounds: Normal breath sounds. No wheezing, rhonchi or rales.     Comments: Once stopped coughing, sounded clear to auscultation B/L.  Abdominal:     General: Bowel sounds are normal. There is no distension.     Palpations: Abdomen is soft.     Tenderness: There is no abdominal tenderness.  Musculoskeletal:     Cervical back: Neck supple. No tenderness.     Comments: LUE 5/5 in same muscles RUE- 4/5 proximal; grip 3+/5; FA 2/5 LLE- 5/5 in same muscles RLE- HF 4/5; KE/KF 4-/5; DF/PF 4-/5   Skin:    General: Skin is warm and dry.     Comments: IV L AC fossa- looks OK Trace LE edema to ankles B/L  Neurological:     Mental Status: She is alert.     Comments: Decreased to light touch on R side arm and leg No clonus, but has brisk Hoffman's RUE Difficulty with slurring words Naming intact and repetition intact- just hard to understand  Psychiatric:        Thought Content: Thought content normal.        Judgment: Judgment normal.     Comments: Tearful frequently during interview     Results for orders placed or performed during the hospital encounter of 04/11/23 (from the past 48 hour(s))  Glucose, capillary     Status: Abnormal   Collection Time: 04/14/23 12:03 PM  Result Value Ref Range   Glucose-Capillary 300 (H) 70 - 99 mg/dL    Comment: Glucose reference range applies only to samples taken after fasting for at least 8 hours.   Comment 1 Notify RN   Glucose, capillary     Status: Abnormal   Collection Time: 04/14/23  4:34 PM  Result Value Ref Range    Glucose-Capillary 346 (H) 70 - 99 mg/dL    Comment: Glucose reference range applies only to samples taken after fasting for at least 8 hours.   Comment 1 Notify RN   Glucose, capillary     Status: Abnormal   Collection Time: 04/14/23  9:23 PM  Result Value Ref Range   Glucose-Capillary 366 (H) 70 - 99 mg/dL    Comment: Glucose reference range applies only to samples taken after fasting for at least 8 hours.  Glucose, capillary     Status: Abnormal   Collection Time: 04/15/23  6:20 AM  Result Value Ref Range   Glucose-Capillary 284 (H) 70 - 99 mg/dL    Comment: Glucose reference range applies only to samples taken after fasting for at least 8 hours.  Glucose, capillary     Status: Abnormal   Collection Time: 04/15/23 11:32 AM  Result Value Ref Range   Glucose-Capillary 255 (H) 70 - 99 mg/dL    Comment: Glucose reference range applies only to samples taken after fasting for at least 8 hours.  Glucose, capillary     Status: Abnormal   Collection Time: 04/15/23  4:37 PM  Result Value Ref Range   Glucose-Capillary 274 (H) 70 - 99 mg/dL    Comment: Glucose reference range applies only to samples taken after fasting for at least 8 hours.  Glucose, capillary     Status: Abnormal   Collection Time: 04/15/23  9:22 PM  Result Value Ref Range   Glucose-Capillary 209 (H) 70 - 99 mg/dL    Comment: Glucose reference range applies only to samples taken after fasting for at least 8 hours.  Glucose, capillary     Status: Abnormal   Collection Time: 04/16/23  6:23 AM  Result Value Ref Range   Glucose-Capillary 241 (H) 70 - 99 mg/dL    Comment: Glucose reference range applies only to samples taken after fasting for at least 8 hours.   No results found.    Blood pressure 128/74, pulse 81, temperature 97.9 F (36.6 C), temperature source Oral, resp. rate 18, height  (1.702 m), weight 115.3 kg, SpO2 97 %.  Medical Problem List and Plan: 1. Functional deficits secondary to L pons acute infarct  with R hemiparesis  -patient may  shower  -ELOS/Goals: 7-10 days supervision  2.  Antithrombotics: -DVT/anticoagulation:  Pharmaceutical: Lovenox  -antiplatelet therapy: Aspirin and Plavix for three weeks followed by aspirin alone  3. Pain  Management: Tylenol as needed  4. Mood/Behavior/Sleep: LCSW to evaluate and provide emotional support  -Depression>The current medical regimen is effective;  continue present plan and medications. Lexapro was old med- since didn't stay on it, not sure if wasn't helpful- will try Celexa 20 mg daily  -antipsychotic agents: n/a  5. Neuropsych/cognition: This patient is capable of making decisions on her own behalf.  6. Skin/Wound Care: Routine skin care checks   7. Fluids/Electrolytes/Nutrition: Routine Is and Os and follow-up chemistries  -carb modified diet  8: Hypertension: monitor TID and prn (home Lasix not restarted)  -continue losartan 50 mg daily  9: Hyperlipidemia: continue statin  10: HIV: continue Biktarvy  11: DM-2: A1c 8.0%; CBGs four times daily; (home meds include Lantus Solostar, Glucotrol XL and Ozempic)  -continue SSI  -continue Semglee 12 units BID  -continue meal coverage 5 units TID  12: Moderate persistent asthma: continue Dulera  13: Allergic rhinitis: continue Claritin  -start Flonase  14: Hyponatremia: follow-up BMP  15: Stress urinary incontinence       Milinda Antis, PA-C 04/16/2023  I have personally performed a face to face diagnostic evaluation of this patient and formulated the key components of the plan.  Additionally, I have personally reviewed laboratory data, imaging studies, as well as relevant notes and concur with the physician assistant's documentation above.   The patient's status has not changed from the original H&P.  Any changes in documentation from the acute care chart have been noted above.

## 2023-04-16 NOTE — Progress Notes (Signed)
Physical Therapy Treatment Patient Details Name: Latoya Roth MRN: [id number] DOB: 09-10-1971 Today's Date: 04/16/2023   History of Present Illness Pt is a 52 y.o. female admitted  04/11/2023 with slurred speech. MRI revealed acute infarct L paramedian pons; no evidence of acute hemorrhage or mass effect/midline shift. PMH includes anxiety, depression, DMII, HTN, HIV, asthma.    PT Comments    Pt received in recliner, hopeful to go to AIR later today. Worked on increasing pace and stability with ambulation which was a challenge for pt. Pt ambulated 2' with RW and min A and needed rest break before being able to continue. Worked on stepping up onto low step with unilateral support. Defer further PT to AIR.     Recommendations for follow up therapy are one component of a multi-disciplinary discharge planning process, led by the attending physician.  Recommendations may be updated based on patient status, additional functional criteria and insurance authorization.  Follow Up Recommendations       Assistance Recommended at Discharge Frequent or constant Supervision/Assistance  Patient can return home with the following A little help with bathing/dressing/bathroom;Assistance with cooking/housework;A lot of help with walking and/or transfers;Direct supervision/assist for medications management;Assist for transportation;Help with stairs or ramp for entrance   Equipment Recommendations  Other (comment) (defer to next venue)    Recommendations for Other Services Rehab consult     Precautions / Restrictions Precautions Precautions: Fall Restrictions Weight Bearing Restrictions: No     Mobility  Bed Mobility               General bed mobility comments: received sitting in recliner    Transfers Overall transfer level: Needs assistance Equipment used: Rolling walker (2 wheels) Transfers: Sit to/from Stand Sit to Stand: Min guard           General transfer comment:  min guard from recliner, closer guarding needed from low toilet and pt with heavy reliance on grab bar    Ambulation/Gait Ambulation/Gait assistance: Min assist Gait Distance (Feet): 60 Feet (2x) Assistive device: Rolling walker (2 wheels) Gait Pattern/deviations: Step-to pattern, Step-through pattern, Decreased stride length, Decreased dorsiflexion - right, Trunk flexed Gait velocity: Decreased Gait velocity interpretation: <1.8 ft/sec, indicate of risk for recurrent falls   General Gait Details: pt ambulated with RW, very slow with changes in direction and needed rest break at 32' due to LE fatigue. Worked on changing pace with ambulation which was difficulty for pt   Social research officer, government Rankin (Stroke Patients Only) Modified Rankin (Stroke Patients Only) Pre-Morbid Rankin Score: No symptoms Modified Rankin: Moderately severe disability     Balance Overall balance assessment: Needs assistance Sitting-balance support: Feet supported Sitting balance-Leahy Scale: Fair Sitting balance - Comments: able to sit without UE support and perform pericare on toilet, no significant weight shift to do so   Standing balance support: Single extremity supported, Bilateral upper extremity supported, During functional activity, No upper extremity supported Standing balance-Leahy Scale: Poor Standing balance comment: LOB standing without UE support                            Cognition Arousal/Alertness: Awake/alert Behavior During Therapy: WFL for tasks assessed/performed Overall Cognitive Status: Impaired/Different from baseline Area of Impairment: Awareness, Problem solving, Safety/judgement  Safety/Judgement: Decreased awareness of safety Awareness: Emergent Problem Solving: Requires verbal cues General Comments: cues for safety        Exercises      General Comments General comments (skin  integrity, edema, etc.): VSS      Pertinent Vitals/Pain Pain Assessment Pain Assessment: No/denies pain    Home Living Family/patient expects to be discharged to:: Private residence Living Arrangements: Alone                      Prior Function            PT Goals (current goals can now be found in the care plan section) Acute Rehab PT Goals Patient Stated Goal: regain independence PT Goal Formulation: With patient Time For Goal Achievement: 04/26/23 Potential to Achieve Goals: Good Progress towards PT goals: Progressing toward goals    Frequency    Min 4X/week      PT Plan Current plan remains appropriate    Co-evaluation              AM-PAC PT "6 Clicks" Mobility   Outcome Measure  Help needed turning from your back to your side while in a flat bed without using bedrails?: A Little Help needed moving from lying on your back to sitting on the side of a flat bed without using bedrails?: A Little Help needed moving to and from a bed to a chair (including a wheelchair)?: A Little Help needed standing up from a chair using your arms (e.g., wheelchair or bedside chair)?: A Little Help needed to walk in hospital room?: A Lot Help needed climbing 3-5 steps with a railing? : A Lot 6 Click Score: 16    End of Session Equipment Utilized During Treatment: Gait belt Activity Tolerance: Patient tolerated treatment well Patient left: in chair;with call bell/phone within reach;with chair alarm set Nurse Communication: Mobility status PT Visit Diagnosis: Other abnormalities of gait and mobility (R26.89);Difficulty in walking, not elsewhere classified (R26.2);Muscle weakness (generalized) (M62.81);Other symptoms and signs involving the nervous system (R29.898)     Time: 1610-9604 PT Time Calculation (min) (ACUTE ONLY): 22 min  Charges:  $Gait Training: 8-22 mins                     Lyanne Co, PT  Acute Rehab Services Secure chat preferred Office  [phone]    Lawana Chambers Lenora Gomes 04/16/2023, 4:30 PM

## 2023-04-16 NOTE — Discharge Summary (Signed)
Physician Discharge Summary  Latoya Roth:[id number] DOB: 20-Jul-1971 DOA: 04/11/2023  PCP: Jackie Plum, MD  Admit date: 04/11/2023 Discharge date: 04/16/2023  Admitted From: Home  Discharge disposition: CIR   Recommendations for Outpatient Follow-Up:   Follow-up with [facility] in 4 weeks.  Discharge Diagnosis:   Principal Problem:   Acute ischemic stroke Active Problems:   Essential hypertension   HIV (human immunodeficiency virus infection)   DM2 (diabetes mellitus, type 2)   Depression   Moderate persistent asthma   Allergic rhinitis   Discharge Condition: Improved.  Diet recommendation:  Carbohydrate-modified.    Wound care: None.  Code status: Full.   History of Present Illness:   Latoya Roth is a 52 y.o. female with past medical history significant for type 2 diabetes mellitus, essential pretension, HIV, moderate persistent asthma, presented to the to hospital with slurred speech associated with right-sided facial droop. In the ED, vitals were relatively stable.  Labs showed sodium slightly low at 135.  Urine drug screen was negative.  Urinalysis was negative as well.  EKG showed normal sinus rhythm.  CT head showed increased hypodensity in the left pons, favored to represent an acute versus early subacute infarct. MRI brain without contrast showed acute infarct in the left paramedian pons. Neurology was consulted and patient was admitted hospital for further evaluation and treatment.    Hospital Course:   Following conditions were addressed during hospitalization as listed below,  Acute ischemic CVA:  Patient presented with dysarthria, right upper extremity weakness and right facial droop.  MRI with left paramedian pons stroke.  Patient was outside the tPA window.  EKG with normal sinus rhythm.   PT OT has seen the patient and plan is rehabilitation at this time.   TTE with  bubble study with LV ejection fraction of  55 to 60% with LVH and diastolic dysfunction. lipid panel with LDL of 92.  Hemoglobin A1c of 8.0 on this admission.    Neurology recommends aspirin Plavix for 3 weeks followed by aspirin alone.  Plan for [facility] neurology follow-up in 4 weeks.   Type 2 Diabetes mellitus with hyperglycemia:  Patient is on metformin, glipizide Ozempic as an outpatient.  Patient has been resumed on oral medication regimen including home insulin but patient was on sliding scale insulin Semglee 12 units twice daily mealtime insulin 5 units 3 times daily and sliding scale while in the hospital.     Depression.  On Lexapro.  Will continue on discharge.    HIV: Latest CD4 count on 03/13/2023 noted to be 848, at which time viral load was found to be undetectable.  Continue Biktarvy   moderate persistent asthma:  Stable at this time.  Continue albuterol and Symbicort.     Allergic Rhinitis:  Continue Zyrtec    Obesity.Body mass index is 39.81 kg/m.  Patient would benefit from weight loss as outpatient.   Disposition.  At this time, patient is stable for disposition to CIR  Medical Consultants:   Neurology  Procedures:    None Subjective:   Today, patient was seen and examined.  Denies new complaints.  Has been working with physical therapy.  Intermittent speech difficulty and has right-sided weakness  Discharge Exam:   Vitals:   04/16/23 0801 04/16/23 0828  BP: 128/74   Pulse: 72 81  Resp: 17 18  Temp: 97.9 F (36.6 C)   SpO2: 99% 97%   Vitals:   04/15/23 2040 04/16/23 0002 04/16/23 0801 04/16/23 8119  BP: (!) 161/88 (!) 153/75 128/74   Pulse: (!) 56 71 72 81  Resp: 18 18 17 18   Temp: 98.6 F (37 C) 98.2 F (36.8 C) 97.9 F (36.6 C)   TempSrc: Oral Oral Oral   SpO2: 98% 98% 99% 97%  Weight:      Height:       Body mass index is 39.81 kg/m.  General: Alert awake, not in obvious distress, obese HENT: pupils equally reacting to light,  No scleral pallor or icterus noted. Oral mucosa is  moist.  Chest:  .  Diminished breath sounds bilaterally. No crackles or wheezes.  CVS: S1 &S2 heard. No murmur.  Regular rate and rhythm. Abdomen: Soft, nontender, nondistended.  Bowel sounds are heard.   Extremities: No cyanosis, clubbing or edema.  Peripheral pulses are palpable. Psych: Alert, awake and oriented, normal mood CNS: Right facial droop, mild dysarthria, right-sided weakness Skin: Warm and dry.  No rashes noted.  The results of significant diagnostics from this hospitalization (including imaging, microbiology, ancillary and laboratory) are listed below for reference.     Diagnostic Studies:   ECHOCARDIOGRAM COMPLETE BUBBLE STUDY  Result Date: 04/12/2023    ECHOCARDIOGRAM REPORT   Patient Name:   Latoya Roth Date of Exam: 04/12/2023 Medical Rec #:  [id number]           Height:       67.0 in Accession #:    [id number]          Weight:       250.0 lb Date of Birth:  1971/04/20           BSA:          2.223 m Patient Age:    51 years            BP:           145/88 mmHg Patient Gender: F                   HR:           70 bpm. Exam Location:  Inpatient Procedure: 2D Echo, Cardiac Doppler, Color Doppler and Saline Contrast Bubble            Study Indications:    stroke  History:        Patient has no prior history of Echocardiogram examinations.                 Risk Factors:Hypertension and Diabetes.  Sonographer:    Mike Gip Referring Phys: [id number] JUSTIN B HOWERTER IMPRESSIONS  1. Left ventricular ejection fraction, by estimation, is 55 to 60%. The left ventricle has normal function. The left ventricle has no regional wall motion abnormalities. There is mild concentric left ventricular hypertrophy. Left ventricular diastolic parameters were normal.  2. Right ventricular systolic function is normal. The right ventricular size is normal.  3. The mitral valve is normal in structure. Trivial mitral valve regurgitation. No evidence of mitral stenosis.  4. The aortic valve is normal  in structure. Aortic valve regurgitation is not visualized. No aortic stenosis is present.  5. Agitated saline contrast bubble study was negative, with no evidence of any interatrial shunt. FINDINGS  Left Ventricle: Left ventricular ejection fraction, by estimation, is 55 to 60%. The left ventricle has normal function. The left ventricle has no regional wall motion abnormalities. The left ventricular internal cavity size was normal in size. There is  mild concentric left ventricular hypertrophy. Left ventricular diastolic  parameters were normal. Right Ventricle: The right ventricular size is normal. No increase in right ventricular wall thickness. Right ventricular systolic function is normal. Left Atrium: Left atrial size was normal in size. Right Atrium: Right atrial size was normal in size. Pericardium: There is no evidence of pericardial effusion. Mitral Valve: The mitral valve is normal in structure. Trivial mitral valve regurgitation. No evidence of mitral valve stenosis. Tricuspid Valve: The tricuspid valve is normal in structure. Tricuspid valve regurgitation is trivial. No evidence of tricuspid stenosis. Aortic Valve: The aortic valve is normal in structure. Aortic valve regurgitation is not visualized. No aortic stenosis is present. Pulmonic Valve: The pulmonic valve was normal in structure. Pulmonic valve regurgitation is not visualized. No evidence of pulmonic stenosis. Aorta: The aortic root is normal in size and structure. Venous: The inferior vena cava was not well visualized. IAS/Shunts: No atrial level shunt detected by color flow Doppler. Agitated saline contrast was given intravenously to evaluate for intracardiac shunting. Agitated saline contrast bubble study was negative, with no evidence of any interatrial shunt.  LEFT VENTRICLE PLAX 2D LVIDd:         4.00 cm      Diastology LVIDs:         2.40 cm      LV e' medial:    8.92 cm/s LV PW:         1.30 cm      LV E/e' medial:  7.8 LV IVS:         1.20 cm      LV e' lateral:   13.40 cm/s LVOT diam:     1.80 cm      LV E/e' lateral: 5.2 LV SV:         58 LV SV Index:   26 LVOT Area:     2.54 cm  LV Volumes (MOD) LV vol d, MOD A2C: 134.0 ml LV vol d, MOD A4C: 149.0 ml LV vol s, MOD A2C: 59.2 ml LV vol s, MOD A4C: 67.7 ml LV SV MOD A2C:     74.8 ml LV SV MOD A4C:     149.0 ml LV SV MOD BP:      78.9 ml RIGHT VENTRICLE RV Basal diam:  3.70 cm RV S prime:     11.40 cm/s TAPSE (M-mode): 2.0 cm LEFT ATRIUM             Index        RIGHT ATRIUM           Index LA diam:        3.10 cm 1.39 cm/m   RA Area:     11.60 cm LA Vol (A2C):   50.1 ml 22.53 ml/m  RA Volume:   25.30 ml  11.38 ml/m LA Vol (A4C):   57.6 ml 25.91 ml/m LA Biplane Vol: 55.8 ml 25.10 ml/m  AORTIC VALVE LVOT Vmax:   107.00 cm/s LVOT Vmean:  73.100 cm/s LVOT VTI:    0.228 m  AORTA Ao Root diam: 3.10 cm Ao Asc diam:  3.30 cm MITRAL VALVE MV Area (PHT): 2.55 cm    SHUNTS MV Decel Time: 297 msec    Systemic VTI:  0.23 m MV E velocity: 69.70 cm/s  Systemic Diam: 1.80 cm MV A velocity: 76.50 cm/s MV E/A ratio:  0.91 Arvilla Meres MD Electronically signed by Arvilla Meres MD Signature Date/Time: 04/12/2023/2:31:08 PM    Final    CT ANGIO HEAD NECK W WO CM  Result  Date: 04/12/2023 CLINICAL DATA:  Stroke/TIA, determine embolic source EXAM: CT ANGIOGRAPHY HEAD AND NECK WITH AND WITHOUT CONTRAST TECHNIQUE: Multidetector CT imaging of the head and neck was performed using the standard protocol during bolus administration of intravenous contrast. Multiplanar CT image reconstructions and MIPs were obtained to evaluate the vascular anatomy. Carotid stenosis measurements (when applicable) are obtained utilizing NASCET criteria, using the distal internal carotid diameter as the denominator. RADIATION DOSE REDUCTION: This exam was performed according to the departmental dose-optimization program which includes automated exposure control, adjustment of the mA and/or kV according to patient size and/or  use of iterative reconstruction technique. CONTRAST:  75mL OMNIPAQUE IOHEXOL 350 MG/ML SOLN COMPARISON:  None Available. FINDINGS: CT HEAD FINDINGS Brain: Known pontine infarct better characterized on recent MRI. No evidence of acute hemorrhage, mass lesion, midline shift or hydrocephalus. Vascular: See below. Skull: No acute fracture. Sinuses/Orbits: Clear sinuses.  No acute orbital findings. Other: No mastoid effusions Review of the MIP images confirms the above findings CTA NECK FINDINGS Aortic arch: Great vessel origins are patent without significant stenosis. Right carotid system: No evidence of dissection, stenosis (50% or greater), or occlusion. Left carotid system: No evidence of dissection, stenosis (50% or greater), or occlusion. Vertebral arteries: Codominant. No evidence of dissection, stenosis (50% or greater), or occlusion. Skeleton: Negative. Other neck: Negative. Upper chest: Visualized lung apices are clear. Review of the MIP images confirms the above findings CTA HEAD FINDINGS Anterior circulation: Bilateral intracranial ICAs, MCAs, and ACAs are patent without proximally image the significant stenosis. Posterior circulation: Bilateral intradural vertebral arteries, basilar artery and bilateral posterior cerebral arteries are patent without proximal hemodynamically significant stenosis. Venous sinuses: As permitted by contrast timing, patent. Review of the MIP images confirms the above findings IMPRESSION: No large vessel occlusion or proximal hemodynamically significant stenosis. Electronically Signed   By: Feliberto Harts M.D.   On: 04/12/2023 10:19   MR BRAIN WO CONTRAST  Result Date: 04/11/2023 CLINICAL DATA:  Acute neurologic deficit EXAM: MRI HEAD WITHOUT CONTRAST TECHNIQUE: Multiplanar, multiecho pulse sequences of the brain and surrounding structures were obtained without intravenous contrast. COMPARISON:  None Available. 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 No  acute hemorrhage. No chronic microhemorrhage. Normal white matter signal, parenchymal volume and CSF spaces. The midline structures are normal. Vascular: Major flow voids are preserved. Skull and upper cervical spine: Normal calvarium and skull base. Visualized upper cervical spine and soft tissues are normal. Sinuses/Orbits:No paranasal sinus fluid levels or advanced mucosal thickening. No mastoid or middle ear effusion. Normal orbits. 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 No acute hemorrhage or mass effect. Electronically Signed   By: Deatra Robinson M.D.   On: 04/11/2023 20:22   CT HEAD WO CONTRAST  Result Date: 04/11/2023 CLINICAL DATA:  Right leg and arm weakness, slurred speech, facial droop EXAM: CT HEAD WITHOUT CONTRAST TECHNIQUE: Contiguous axial images were obtained from the base of the skull through the vertex without intravenous contrast. RADIATION DOSE REDUCTION: This exam was performed according to the departmental dose-optimization program which includes automated exposure control, adjustment of the mA and/or kV according to patient size and/or use of iterative reconstruction technique. COMPARISON:  04/09/2023 CT head FINDINGS: Brain: Increased hypodensity in the left pons (series 3, image 11-12), which in retrospect was quite subtle but likely present on the 04/09/2023 CT. No acute hemorrhage, mass, mass effect, or midline shift. No hydrocephalus or extra-axial collection. Vascular: No hyperdense vessel. Skull: Negative for fracture or focal lesion. Sinuses/Orbits:  No acute finding. Status post bilateral lens replacements. Other: The mastoid air cells are well aerated. IMPRESSION: 1. Increased hypodensity in the left pons, favored to represent an acute or early subacute infarct. An MRI head is recommended for further evaluation. 2. No acute hemorrhage. These results were called by telephone at the time of interpretation on 04/11/2023 at 5:41 pm to provider FORD, who verbally  acknowledged these results. Electronically Signed   By: Wiliam Ke M.D.   On: 04/11/2023 17:44     Labs:   Basic Metabolic Panel: Recent Labs  Lab 04/11/23 1606 04/11/23 1618 04/12/23 0100 04/14/23 0304  NA 135 135 131* 132*  K 3.6 3.7 3.7 3.8  CL 95* 97* 96* 95*  CO2 27  --  27 28  GLUCOSE 197* 202* 271* 329*  BUN 10 11 11 12   CREATININE 0.81 0.70 0.95 0.76  CALCIUM 9.5  --  9.0 8.9  MG  --   --  1.8  1.8 1.7   GFR Estimated Creatinine Clearance: 106.7 mL/min (by C-G formula based on SCr of 0.76 mg/dL). Liver Function Tests: Recent Labs  Lab 04/11/23 1606 04/12/23 0100  AST 17 17  ALT 16 15  ALKPHOS 89 80  BILITOT 0.5 0.5  PROT 8.0 7.2  ALBUMIN 4.1 3.5   No results for input(s): "LIPASE", "AMYLASE" in the last 168 hours. No results for input(s): "AMMONIA" in the last 168 hours. Coagulation profile Recent Labs  Lab 04/11/23 1606  INR 1.0    CBC: Recent Labs  Lab 04/11/23 1606 04/11/23 1618 04/12/23 0100 04/14/23 0304  WBC 7.7  --  6.6 6.2  NEUTROABS 4.0  --  3.4  --   HGB 13.9 15.3* 13.5 12.0  HCT 42.2 45.0 41.1 38.2  MCV 80.8  --  81.4 83.0  PLT 414*  --  378 336   Cardiac Enzymes: No results for input(s): "CKTOTAL", "CKMB", "CKMBINDEX", "TROPONINI" in the last 168 hours. BNP: Invalid input(s): "POCBNP" CBG: Recent Labs  Lab 04/15/23 0620 04/15/23 1132 04/15/23 1637 04/15/23 2122 04/16/23 0623  GLUCAP 284* 255* 274* 209* 241*   D-Dimer No results for input(s): "DDIMER" in the last 72 hours. Hgb A1c No results for input(s): "HGBA1C" in the last 72 hours. Lipid Profile No results for input(s): "CHOL", "HDL", "LDLCALC", "TRIG", "CHOLHDL", "LDLDIRECT" in the last 72 hours. Thyroid function studies No results for input(s): "TSH", "T4TOTAL", "T3FREE", "THYROIDAB" in the last 72 hours.  Invalid input(s): "FREET3" Anemia work up No results for input(s): "VITAMINB12", "FOLATE", "FERRITIN", "TIBC", "IRON", "RETICCTPCT" in the last 72  hours. Microbiology No results found for this or any previous visit (from the past 240 hour(s)).   Discharge Instructions:   Discharge Instructions     Ambulatory referral to Neurology   Complete by: As directed    Follow up with stroke clinic NP (Jessica Vanschaick or Darrol Angel, if both not available, consider Manson Allan, or [facility]) at [facility] in about 4 weeks. Thanks.   Diet Carb Modified   Complete by: As directed    Discharge instructions   Complete by: As directed    Follow-up with [facility] neurology as outpatient in 4 weeks..  Ambulatory referral has been made.   Increase activity slowly   Complete by: As directed       Allergies as of 04/16/2023       Reactions   Penicillins Anaphylaxis   Shellfish Allergy Anaphylaxis, Hives, Swelling   Amoxicillin Rash        Medication  List     STOP taking these medications    albuterol 108 (90 Base) MCG/ACT inhaler Commonly known as: VENTOLIN HFA   hydrOXYzine 25 MG tablet Commonly known as: ATARAX   meloxicam 15 MG tablet Commonly known as: MOBIC       TAKE these medications    albuterol-ipratropium 18-103 MCG/ACT inhaler Commonly known as: COMBIVENT Inhale 2 puffs into the lungs every 6 (six) hours as needed for shortness of breath. Reported on 04/27/2016   aspirin EC 81 MG tablet Take 1 tablet (81 mg total) by mouth daily. Swallow whole. Start taking on: April 17, 2023   Biktarvy 50-200-25 MG Tabs tablet Generic drug: bictegravir-emtricitabine-tenofovir AF Take 1 tablet by mouth daily.   budesonide-formoterol 160-4.5 MCG/ACT inhaler Commonly known as: SYMBICORT Inhale 2 puffs into the lungs 2 (two) times daily.   cetirizine 10 MG tablet Commonly known as: ZYRTEC Take 10 mg by mouth daily.   clopidogrel 75 MG tablet Commonly known as: PLAVIX Take 1 tablet (75 mg total) by mouth daily for 17 days. Start taking on: April 17, 2023   furosemide 40 MG tablet Commonly known as: LASIX Take 40 mg by  mouth daily.   glipiZIDE 10 MG 24 hr tablet Commonly known as: GLUCOTROL XL Take 10 mg by mouth daily.   Lantus SoloStar 100 UNIT/ML Solostar Pen Generic drug: insulin glargine Inject 10 Units into the skin at bedtime.   losartan 50 MG tablet Commonly known as: COZAAR Take 50 mg by mouth daily.   melatonin 3 MG Tabs tablet Take 1 tablet (3 mg total) by mouth at bedtime as needed (insomnia).   omeprazole 20 MG capsule Commonly known as: PRILOSEC Take 20 mg by mouth daily.   Ozempic (0.25 or 0.5 MG/DOSE) 2 MG/3ML Sopn Generic drug: Semaglutide(0.25 or 0.5MG /DOS) Inject 0.5 mg into the skin once a week.   rosuvastatin 20 MG tablet Commonly known as: CRESTOR Take 1 tablet (20 mg total) by mouth daily. Start taking on: April 17, 2023        Follow-up Information     [facility]. Schedule an appointment as soon as possible for a visit in 1 month(s).   Specialty: Neurology Why: stroke clinic Contact information: [address] [place] [address] [phone]                 Time coordinating discharge: 39 minutes  Signed:  Grayton Lobo  [facility] 04/16/2023, 10:29 AM

## 2023-04-16 NOTE — Discharge Instructions (Addendum)
Inpatient Rehab Discharge Instructions  Latoya Roth Discharge date and time:  04/24/2023  Activities/Precautions/ Functional Status: Activity: no lifting, driving, or strenuous exercise until cleared by MD Diet: diabetic diet Wound Care: none needed Functional status:  ___ No restrictions     ___ Walk up steps independently ___ 24/7 supervision/assistance   ___ Walk up steps with assistance _x__ Intermittent supervision/assistance  ___ Bathe/dress independently ___ Walk with walker     ___ Bathe/dress with assistance ___ Walk Independently    ___ Shower independently ___ Walk with assistance    _x__ Shower with assistance _x__ No alcohol     ___ Return to work/school ________ Special Instructions: No driving, alcohol consumption or tobacco use.  Recommend checking fingerstick blood sugars four (4) times daily and record. Bring this information with you to follow-up appointment with PCP.  Recommend daily BP measurement in same arm and record time of day. Bring this information with you to follow-up appointment with PCP.                        STROKE/TIA DISCHARGE INSTRUCTIONS SMOKING Cigarette smoking nearly doubles your risk of having a stroke & is the single most alterable risk factor  If you smoke or have smoked in the last 12 months, you are advised to quit smoking for your health. Most of the excess cardiovascular risk related to smoking disappears within a year of stopping. Ask you doctor about anti-smoking medications [place] Quit Line: 1-800-QUIT NOW Free Smoking Cessation Classes [phone]  CHOLESTEROL Know your levels; limit fat & cholesterol in your diet  Lipid Panel     Component Value Date/Time   CHOL 187 04/12/2023 0100   TRIG 241 (H) 04/12/2023 0100   HDL 47 04/12/2023 0100   CHOLHDL 4.0 04/12/2023 0100   VLDL 48 (H) 04/12/2023 0100   LDLCALC 92 04/12/2023 0100   LDLCALC 105 (H) 09/20/2022 1058     Many patients benefit from  treatment even if their cholesterol is at goal. Goal: Total Cholesterol (CHOL) less than 160 Goal:  Triglycerides (TRIG) less than 150 Goal:  HDL greater than 40 Goal:  LDL (LDLCALC) less than 100   BLOOD PRESSURE American Stroke Association blood pressure target is less that 120/80 mm/Hg  Your discharge blood pressure is:  BP: (!) 127/50 Monitor your blood pressure Limit your salt and alcohol intake Many individuals will require more than one medication for high blood pressure  DIABETES (A1c is a blood sugar average for last 3 months) Goal HGBA1c is under 7% (HBGA1c is blood sugar average for last 3 months)  Diabetes: Diagnosis of diabetes:  Your A1c:8.0 %    Lab Results  Component Value Date   HGBA1C 8.0 (H) 04/11/2023    Your HGBA1c can be lowered with medications, healthy diet, and exercise. Check your blood sugar as directed by your physician Call your physician if you experience unexplained or low blood sugars.  PHYSICAL ACTIVITY/REHABILITATION Goal is 30 minutes at least 4 days per week  Activity: Increase activity slowly, Therapies: Physical Therapy: Home Health and Occupational Therapy: Home Health Return to work: when cleared by MD Activity decreases your risk of heart attack and stroke and makes your heart stronger.  It helps control your weight and blood pressure; helps you relax and can improve your mood. Participate in a regular exercise program. Talk with your doctor about the best form of exercise for you (dancing, walking, swimming, cycling).  DIET/WEIGHT Goal is to  maintain a healthy weight  Your discharge diet is:  Diet Order             Diet Carb Modified Fluid consistency: Thin; Room service appropriate? Yes  Diet effective now                  thin liquids Your height is:  Height: 5\' 7"  (170.2 cm) Your current weight is: Weight: 113.7 kg Your Body Mass Index (BMI) is:  BMI (Calculated): 39.25 Following the type of diet specifically designed for you will  help prevent another stroke. Your goal weight range is:   Your goal Body Mass Index (BMI) is 19-24. Healthy food habits can help reduce 3 risk factors for stroke:  High cholesterol, hypertension, and excess weight.  RESOURCES Stroke/Support Group:  Call [phone]   STROKE EDUCATION PROVIDED/REVIEWED AND GIVEN TO PATIENT Stroke warning signs and symptoms How to activate emergency medical system (call 911). Medications prescribed at discharge. Need for follow-up after discharge. Personal risk factors for stroke. Pneumonia vaccine given: No Flu vaccine given: No My questions have been answered, the writing is legible, and I understand these instructions.  I will adhere to these goals & educational materials that have been provided to me after my discharge from the hospital.     My questions have been answered and I understand these instructions. I will adhere to these goals and the provided educational materials after my discharge from the hospital.  Patient/Caregiver Signature _______________________________ Date __________  Clinician Signature _______________________________________ Date __________  Please bring this form and your medication list with you to all your follow-up doctor's appointments.

## 2023-04-16 NOTE — Progress Notes (Signed)
Patient arrived on unit escorted by RN;pt oriented to unit, family notified of arrival. No reports of pain.

## 2023-04-17 DIAGNOSIS — I6389 Other cerebral infarction: Secondary | ICD-10-CM | POA: Diagnosis not present

## 2023-04-17 LAB — CBC WITH DIFFERENTIAL/PLATELET
Abs Immature Granulocytes: 0.05 10*3/uL (ref 0.00–0.07)
Basophils Absolute: 0 10*3/uL (ref 0.0–0.1)
Basophils Relative: 1 %
Eosinophils Absolute: 0.2 10*3/uL (ref 0.0–0.5)
Eosinophils Relative: 3 %
HCT: 39 % (ref 36.0–46.0)
Hemoglobin: 12.8 g/dL (ref 12.0–15.0)
Immature Granulocytes: 1 %
Lymphocytes Relative: 34 %
Lymphs Abs: 2.2 10*3/uL (ref 0.7–4.0)
MCH: 26.4 pg (ref 26.0–34.0)
MCHC: 32.8 g/dL (ref 30.0–36.0)
MCV: 80.6 fL (ref 80.0–100.0)
Monocytes Absolute: 0.6 10*3/uL (ref 0.1–1.0)
Monocytes Relative: 9 %
Neutro Abs: 3.4 10*3/uL (ref 1.7–7.7)
Neutrophils Relative %: 52 %
Platelets: 356 10*3/uL (ref 150–400)
RBC: 4.84 MIL/uL (ref 3.87–5.11)
RDW: 13.3 % (ref 11.5–15.5)
WBC: 6.4 10*3/uL (ref 4.0–10.5)
nRBC: 0 % (ref 0.0–0.2)

## 2023-04-17 LAB — GLUCOSE, CAPILLARY
Glucose-Capillary: 231 mg/dL — ABNORMAL HIGH (ref 70–99)
Glucose-Capillary: 211 mg/dL — ABNORMAL HIGH (ref 70–99)
Glucose-Capillary: 179 mg/dL — ABNORMAL HIGH (ref 70–99)
Glucose-Capillary: 224 mg/dL — ABNORMAL HIGH (ref 70–99)

## 2023-04-17 LAB — COMPREHENSIVE METABOLIC PANEL
ALT: 13 U/L (ref 0–44)
AST: 15 U/L (ref 15–41)
Albumin: 3.2 g/dL — ABNORMAL LOW (ref 3.5–5.0)
Alkaline Phosphatase: 80 U/L (ref 38–126)
Anion gap: 11 (ref 5–15)
BUN: 11 mg/dL (ref 6–20)
CO2: 27 mmol/L (ref 22–32)
Calcium: 9.2 mg/dL (ref 8.9–10.3)
Chloride: 93 mmol/L — ABNORMAL LOW (ref 98–111)
Creatinine, Ser: 0.71 mg/dL (ref 0.44–1.00)
GFR, Estimated: >60 mL/min (ref >60)
Glucose, Bld: 259 mg/dL — ABNORMAL HIGH (ref 70–99)
Potassium: 4 mmol/L (ref 3.5–5.1)
Sodium: 131 mmol/L — ABNORMAL LOW (ref 135–145)
Total Bilirubin: 0.6 mg/dL (ref 0.3–1.2)
Total Protein: 7.1 g/dL (ref 6.5–8.1)

## 2023-04-17 LAB — MAGNESIUM: Magnesium: 1.7 mg/dL (ref 1.7–2.4)

## 2023-04-17 MED ORDER — BENZONATATE 100 MG PO CAPS
100.0000 mg | ORAL_CAPSULE | Freq: Two times a day (BID) | ORAL | Status: DC
  Administered 2023-04-17 – 2023-04-23 (×13): 100 mg via ORAL
  Filled 2023-04-17 (×13): qty 1

## 2023-04-17 MED ORDER — IPRATROPIUM-ALBUTEROL 0.5-2.5 (3) MG/3ML IN SOLN: 3.0000 mL | RESPIRATORY_TRACT | Status: DC | PRN

## 2023-04-17 MED ORDER — PNEUMOCOCCAL 20-VAL CONJ VACC 0.5 ML IM SUSY
0.5000 mL | PREFILLED_SYRINGE | Freq: Once | INTRAMUSCULAR | Status: AC
  Administered 2023-04-17: 0.5 mL via INTRAMUSCULAR
  Filled 2023-04-17: qty 0.5

## 2023-04-17 MED ORDER — GLUCERNA SHAKE PO LIQD
237.0000 mL | Freq: Three times a day (TID) | ORAL | Status: DC
  Administered 2023-04-17 (×2): 237 mL via ORAL

## 2023-04-17 NOTE — Progress Notes (Signed)
Patient ID: Latoya Roth, female   DOB: [birth year], 52 y.o.   MRN: [id number]  Team Conference Report to Patient/Family  Team Conference discussion was reviewed with the patient and caregiver, including goals, any changes in plan of care and target discharge date.  Patient and caregiver express understanding and are in agreement.  The patient has a target discharge date of 04/26/23.  Sw met with patient, introduced self and explained role. Patient lives alone in a 1 level apartment. Patient anticipating assistance form her God sister. Sw will complete full assessment tomorrow. Patient coughing uncontrollably and tearful and reports that she has treatments at home for her bronchitis. Sw will follow up with physician.  Andria Rhein 04/17/2023, 2:11 PM

## 2023-04-17 NOTE — Evaluation (Signed)
Occupational Therapy Assessment and Plan  Patient Details  Name: Latoya Roth MRN: [id number] Date of Birth: 09/27/1971  OT Diagnosis: cognitive deficits, hemiplegia affecting dominant side, and muscle weakness (generalized) Rehab Potential: Rehab Potential (ACUTE ONLY): Excellent ELOS: 7-9 days   Today's Date: 04/17/2023 OT Individual Time: 8295-6213 and [id number] OT Individual Time Calculation (min): 72 min  and 73 min  Hospital Problem: Principal Problem:   Acute brainstem infarction   Past Medical History:  Past Medical History:  Diagnosis Date   Anxiety    Arthritis    Asthma    Bell's palsy    BV (bacterial vaginosis)    Depression    Diabetes mellitus    HIV disease    Hypertension    Obesity    Past Surgical History:  Past Surgical History:  Procedure Laterality Date   CHOLECYSTECTOMY      Assessment & Plan Clinical Impression: Patient is a 52 year old R handed female who presented to the ED on 04/11/2023 complaining of right-sided weakness and right-facial droop that started four days prior to presentation. Head CT obtained with strong suspicion for a pontine infarct which was followed by an MRI that confirmed a subacute left paramedian pons infarctyear old female with recent admission  Patient transferred to CIR on 04/16/2023 .    Patient currently requires mod with basic self-care skills secondary to abnormal tone, unbalanced muscle activation, decreased coordination, and decreased motor planning and decreased standing balance, decreased postural control, hemiplegia, and decreased balance strategies.  Prior to hospitalization, patient could complete BADL with modified independent .  Patient will benefit from skilled intervention to decrease level of assist with basic self-care skills and increase independence with basic self-care skills prior to discharge home with care partner.  Anticipate patient will require intermittent supervision and minimal physical  assistance and follow up outpatient.  OT - End of Session Activity Tolerance: Decreased this session Endurance Deficit: Yes OT Assessment Rehab Potential (ACUTE ONLY): Excellent OT Patient demonstrates impairments in the following area(s): Balance;Safety;Endurance;Motor OT Basic ADL's Functional Problem(s): Eating;Grooming;Bathing;Dressing;Toileting OT Advanced ADL's Functional Problem(s): Simple Meal Preparation;Light Housekeeping OT Transfers Functional Problem(s): Toilet;Tub/Shower OT Additional Impairment(s): Fuctional Use of Upper Extremity OT Plan OT Intensity: Minimum of 1-2 x/day, 45 to 90 minutes OT Frequency: 5 out of 7 days OT Duration/Estimated Length of Stay: 7-9 days OT Treatment/Interventions: Balance/vestibular training;Discharge planning;Functional electrical stimulation;Self Care/advanced ADL retraining;Therapeutic Activities;UE/LE Coordination activities;Cognitive remediation/compensation;Functional mobility training;Patient/family education;Therapeutic Exercise;UE/LE Strength taining/ROM;Splinting/orthotics;Neuromuscular re-education;DME/adaptive equipment instruction OT Self Feeding Anticipated Outcome(s): Mod I OT Basic Self-Care Anticipated Outcome(s): Mod I OT Toileting Anticipated Outcome(s): Mod I OT Bathroom Transfers Anticipated Outcome(s): Mod I OT Recommendation Recommendations for Other Services: Neuropsych consult Patient destination: Home Follow Up Recommendations: Outpatient OT Equipment Recommended: To be determined Equipment Details: Has a cane - has no bathroom equipment   OT Evaluation Precautions/Restrictions  Precautions Precautions: Fall Precaution Comments: HIV; R hemi Restrictions Weight Bearing Restrictions: No   Pain Pain Assessment Pain Scale: 0-10 Pain Score: 0-No pain Home Living/Prior Functioning Home Living Family/patient expects to be discharged to:: Private residence Living Arrangements: Alone Available Help at  Discharge: Other (Comment), Family Therapist, sports and her Son can check on her intermittently during the day) Type of Home: Apartment Home Access: Level entry Home Layout: One level Bathroom Shower/Tub: Engineer, manufacturing systems: Standard Bathroom Accessibility: Yes Additional Comments: used a cane prior due to circulation problems in left leg  Lives With: Alone IADL History Homemaking Responsibilities: Yes Meal Prep Responsibility: Primary Laundry Responsibility:  Primary (takes laundry to sons house - 2 steps up into house - no railing) Cleaning Responsibility: Primary Bill Paying/Finance Responsibility: Primary Shopping Responsibility: Primary Current License: No (has never driven) Education: GED Occupation: On disability (asthma and arthritis) Leisure and Hobbies: watch TV Prior Function Level of Independence: Independent with basic ADLs, Requires assistive device for independence, Independent with transfers, Independent with gait  Able to Take Stairs?: No Driving: No Vocation: On disability Vision Baseline Vision/History: 4 Cataracts (reports she needs to go see the eye doctor) Ability to See in Adequate Light: 0 Adequate Patient Visual Report: No change from baseline Vision Assessment?: No apparent visual deficits Eye Alignment: Within Functional Limits Ocular Range of Motion: Within Functional Limits Alignment/Gaze Preference: Within Defined Limits Tracking/Visual Pursuits: Able to track stimulus in all quads without difficulty Saccades: Within functional limits Convergence: Within functional limits Perception  Perception: Within Functional Limits Praxis Praxis: Intact Cognition Cognition Overall Cognitive Status: Within Functional Limits for tasks assessed Arousal/Alertness: Awake/alert Orientation Level: Person;Place;Situation Person: Oriented Place: Oriented Situation: Oriented Memory: Impaired Memory Impairment: Retrieval deficit Attention:  Selective;Alternating Focused Attention: Appears intact Sustained Attention: Appears intact Selective Attention: Appears intact Awareness: Impaired Awareness Impairment: Anticipatory impairment Problem Solving: Appears intact Executive Function: Organizing;Initiating;Self Monitoring;Self Correcting Sequencing: Appears intact Sequencing Impairment: Functional basic Organizing: Appears intact Organizing Impairment: Functional basic Initiating: Appears intact Self Monitoring: Appears intact Self Correcting: Appears intact Safety/Judgment: Appears intact Comments: Will continue to assess in a functional context Brief Interview for Mental Status (BIMS) Repetition of Three Words (First Attempt): 3 Temporal Orientation: Year: Correct Temporal Orientation: Month: Accurate within 5 days Temporal Orientation: Day: Correct Recall: "Sock": Yes, no cue required Recall: "Blue": Yes, no cue required Recall: "Bed": Yes, no cue required BIMS Summary Score: 15 Sensation Sensation Light Touch: Appears Intact Hot/Cold: Appears Intact Proprioception: Appears Intact Stereognosis: Not tested Additional Comments: Diminished sensation on R side but able to discern light touch Coordination Gross Motor Movements are Fluid and Coordinated: No Fine Motor Movements are Fluid and Coordinated: No Coordination and Movement Description: R hemi Finger Nose Finger Test: dysmetric movement Motor  Motor Motor: Hemiplegia  Trunk/Postural Assessment  Cervical Assessment Cervical Assessment: Within Functional Limits Thoracic Assessment Thoracic Assessment: Within Functional Limits Lumbar Assessment Lumbar Assessment: Within Functional Limits Postural Control Postural Control: Deficits on evaluation  Balance Balance Balance Assessed: Yes Standardized Balance Assessment Standardized Balance Assessment: Berg Balance Test;Timed Up and Go Test Berg Balance Test Sit to Stand: Able to stand using hands after  several tries Standing Unsupported: Able to stand 2 minutes with supervision Sitting with Back Unsupported but Feet Supported on Floor or Stool: Able to sit safely and securely 2 minutes Stand to Sit: Uses backs of legs against chair to control descent Transfers: Needs one person to assist Standing Unsupported with Eyes Closed: Able to stand 10 seconds with supervision Standing Ubsupported with Feet Together: Needs help to attain position but able to stand for 30 seconds with feet together From Standing, Reach Forward with Outstretched Arm: Loses balance while trying/requires external support From Standing Position, Pick up Object from Floor: Unable to try/needs assist to keep balance From Standing Position, Turn to Look Behind Over each Shoulder: Needs assist to keep from losing balance and falling Turn 360 Degrees: Needs assistance while turning Standing Unsupported, Alternately Place Feet on Step/Stool: Needs assistance to keep from falling or unable to try Standing Unsupported, One Foot in Front: Loses balance while stepping or standing Standing on One Leg: Unable to try or needs assist to  prevent fall Total Score: 16 Timed Up and Go Test TUG: Normal TUG Normal TUG (seconds): 72 Static Sitting Balance Static Sitting - Balance Support: No upper extremity supported;Feet supported Static Sitting - Level of Assistance: 7: Independent Dynamic Sitting Balance Dynamic Sitting - Balance Support: No upper extremity supported;During functional activity;Feet supported Dynamic Sitting - Level of Assistance: 5: Stand by assistance Dynamic Sitting - Balance Activities: Lateral lean/weight shifting;Forward lean/weight shifting;Reaching for Higher education careers adviser Standing - Balance Support: Bilateral upper extremity supported;During functional activity Static Standing - Level of Assistance: 4: Min assist Dynamic Standing Balance Dynamic Standing - Balance Support: Bilateral upper  extremity supported;During functional activity Dynamic Standing - Level of Assistance: 4: Min assist Dynamic Standing - Balance Activities: Lateral lean/weight shifting;Forward lean/weight shifting;Reaching for objects Extremity/Trunk Assessment RUE Assessment RUE Assessment: Exceptions to [facility] Active Range of Motion (AROM) Comments: slighty limited range above 115* - can achieve 140* but shows compensatory patterns General Strength Comments: 4-/5 shoulder, 4/5 grip RUE Body System: Neuro Brunstrum levels for arm and hand: Arm;Hand Brunstrum level for arm: Stage IV Movement is deviating from synergy Brunstrum level for hand: Stage V Independence from basic synergies RUE Tone RUE Tone: Mild;Hypertonic Hypertonic Details: elbow flexors LUE Assessment LUE Assessment: Within Functional Limits  Care Tool Care Tool Self Care Eating   Eating Assist Level: Minimal Assistance - Patient > 75%    Oral Care    Oral Care Assist Level: Minimal Assistance - Patient > 75%    Bathing   Body parts bathed by patient: Right arm;Chest;Abdomen;Right upper leg;Left upper leg;Face Body parts bathed by helper: Front perineal area;Buttocks;Left lower leg;Right lower leg   Assist Level: Moderate Assistance - Patient 50 - 74%    Upper Body Dressing(including orthotics)   What is the patient wearing?: Pull over shirt   Assist Level: Minimal Assistance - Patient > 75%    Lower Body Dressing (excluding footwear)   What is the patient wearing?: Pants;Underwear/pull up Assist for lower body dressing: Moderate Assistance - Patient 50 - 74%    Putting on/Taking off footwear   What is the patient wearing?: Socks;Shoes Assist for footwear: Minimal Assistance - Patient > 75%       Care Tool Toileting Toileting activity   Assist for toileting: Moderate Assistance - Patient 50 - 74%     Care Tool Bed Mobility Roll left and right activity   Roll left and right assist level: Supervision/Verbal cueing     Sit to lying activity   Sit to lying assist level: Minimal Assistance - Patient > 75%    Lying to sitting on side of bed activity   Lying to sitting on side of bed assist level: the ability to move from lying on the back to sitting on the side of the bed with no back support.: Minimal Assistance - Patient > 75%     Care Tool Transfers Sit to stand transfer   Sit to stand assist level: Minimal Assistance - Patient > 75%    Chair/bed transfer   Chair/bed transfer assist level: Minimal Assistance - Patient > 75%     Toilet transfer   Assist Level: Minimal Assistance - Patient > 75%     Care Tool Cognition  Expression of Ideas and Wants Expression of Ideas and Wants: 3. Some difficulty - exhibits some difficulty with expressing needs and ideas (e.g, some words or finishing thoughts) or speech is not clear  Understanding Verbal and Non-Verbal Content Understanding Verbal and Non-Verbal Content: 4.  Understands (complex and basic) - clear comprehension without cues or repetitions   Memory/Recall Ability Memory/Recall Ability : Current season;That he or she is in a hospital/hospital unit   Refer to Care Plan for Long Term Goals  SHORT TERM GOAL WEEK 1 OT Short Term Goal 1 (Week 1): STG=LTG  Recommendations for other services: Neuropsych and Adult nurse group, Stress management, and Outing/community reintegration   Skilled Therapeutic Intervention AM Session:  Patient declined shower - already washed and dressed.  Indicated need to void.  Walked to bathroom without device and mod assist - patient very fearful.  LEFT knee at times unstable (prior problem) and patient anticipates navigating incline/ decline in floor with reluctance.  Patient with good arm movement, but has strong compensatory patterns - elevating shoulder, laterally flexing to left side.  Responded well to guiding/ cueing.   Left up in recliner with safety belt in place and call bell / personal items in  reach.    PM Session:  Patient received in recliner.  Indicated need to void - walked to bathroom with walker and min assist.  Patient surprised by loose stool, unable to complete toilet hygiene.  Patient able to stand at sink to wash hands.  Walked partially to gym with min assist and RW.  Completed coordination testing, and Fast UL.  Worked on overhead reach with weight shift to right.  Encouraging proximal humerus dropping down for reaching up.  Worked on Public relations account executive on vertical surface.   Patient returned to room and to recliner with safety belt in place and engaged.  Patient has putty in room.  Reviewed putty exercises. Call bell and personal items in reach.     ADL ADL Eating: Minimal assistance Where Assessed-Eating: Chair Grooming: Minimal assistance Where Assessed-Grooming: Standing at sink;Sitting at sink Upper Body Bathing: Minimal assistance Where Assessed-Upper Body Bathing: Chair Lower Body Bathing: Minimal assistance Where Assessed-Lower Body Bathing: Chair;Standing at sink Upper Body Dressing: Minimal assistance Where Assessed-Upper Body Dressing: Chair Lower Body Dressing: Minimal assistance Where Assessed-Lower Body Dressing: Chair Toileting: Minimal assistance Where Assessed-Toileting: Teacher, adult education: Moderate assistance Toilet Transfer Method: Proofreader: Engineer, technical sales: Unable to assess Tub/Shower Transfer Method: Unable to assess [employer] Transfer: Unable to assess [employer] Method: Unable to assess ADL Comments: patient has tub/shower at home - no equipment. Mobility  Transfers Sit to Stand: Minimal Assistance - Patient > 75% Stand to Sit: Minimal Assistance - Patient > 75%  FAST-UL Outcome Measure  Hand-to-mouth (HtM) Movement Starting Position: Participant seated on a standard chair without armrests. Trunk leaning on back support of chair. Both hands placed in pronated position on  the ipsilateral middle thigh. Feet placed flat on the floor. If participants have any difficulty in understanding instructions (i.e. aphasia) a visual demonstration is suggested. For each of the 5 tasks of the FAST-UL, the subject at first performs the movement with the less affected UL and then with the affected one. The movement can be repeated 3 times and the best score of the three attempts is assigned.   Instructions: Each subject is asked to move the hand towards the mouth, touch it with fingertips and return to the thigh. Motor task occurs without moving the trunk off the back support and without moving the head toward the hand.   Scoring: Clinical score from 0 to 3 is provided by comparing affected side with less affected one as follows: 0 = no movement at all. 1 = The  movement task is not completed (less of 50% of the contralateral HtM movement). 2 = The movement task is not completed (more of 50% of the contralateral HtM movement but the mouth is not reached) or the movement task is completed with compensations. If the mouth is touched with the wrist or the palm or the movement is performed with head or trunk compensations (flexion of the head and trunk towards the hand) the score is 2.   3 = movement carried out at 100% of the contralateral HtM movement. HtM occurs with adequate shoulder flexion and abduction, elbow flexion, and forearm supination. The mouth is touched with fingertips.  Patient Score: 3   Reach to Target (RtT) Movement Starting Position: Same starting conditions of HtM movement. Instructions: Each subject is asked to move the hand toward a target (i.e. the hand of the examiner) located in front of the subject in the ipsilateral workspace at shoulder height, at a distance corresponding to 100% of the fully extended UL within arm's reach (less affected arm as reference). Participants have to reach, touch the target, and return. Motor task occurs without moving the trunk  off the back support. Scoring: Clinical score from 0 to 3 is provided by comparing affected side with less affected one as follows: 0 = no movement at all. 1 = The movement task is not completed (less of 50% of the contralateral RtT movement).  2 = The movement task is not completed (more of 50% of the contralateral RtT movement but the target is not reached) or the movement task is completed with compensations (i.e. the trunk loses contact with the back support of the chair with forward displacement, shoulder flexion occurs with excessive scapular elevation, or shoulder excessive abduction). If the target is reached with trunk or shoulder compensations for inadequate elbow and finger extension the score is 2.  3 = movement performed at 100% of the contralateral RtT. The target is reached with adequate shoulder flexion, elbow, wrist and finger extension.  Patient Score: 2   Prono-supination (PS) Movement Starting Position: Same starting conditions of HtM movement. Instructions: Motor task occurs without moving the trunk anteriorly or laterally, the medial side of the humerus is against the body, the forearm is fully pronated with the hand resting on the thigh. Scoring: Clinical score from 0 to 3 is provided by comparing paretic side with less affected one as follows: 0 = no movement at all. 1 = The movement task is not completed (less of 50% of the contralateral PS movement).  2 = The movement task is not completed (more of 50% of the contralateral PS movement but the forearm is not fully supinated) or the movement task is completed with compensations (i.e. excessive trunk inclination, shoulder abduction). If the movement is completed with compensations at elbow, shoulder or trunk level the score is 2. 3 = movement performed at 100% of the contralateral PS (complete supination of the forearm with the dorsal part of the hand in contact with the thigh).   Patient Score: 3   Grasp and Release  (GaR) Movement Starting position: Participant seated on a standard chair. Hip and knees in 90 flexion, feet flat on the floor. Upper limb (UL) resting on a table in front of the participant with approximately 90 elbow flexion, forearm pronated and fingers in a relaxed extended and adducted position.  Instructions: The subject performs a grasping movement of a cylindrical rigid glass (at least 6 cm diameter) placed proximally to an imaginary line connecting  the distal joints of thumb and index finger. The subject is asked to grasp the glass, lift it at least 2 cm (elbow remains in contact with the table), and release it. Scoring:  Clinical score from 0 to 3 is provided by comparing affected side with less affected one as follows: 0 = No movement. The grasp is not possible. 1 = The movement task is not completed (less of 50% of the task). Some prehension is possible but the grasp is not sufficiently stable to lift the object; the grasp can be performed with the use of the less affected hand only to stabilize the glass for inadequate hand/finger opening and the release is not possible. Some hand opening is required otherwise the score is 0. 2 = The movement task is not completed (more of 50% of the task). The object is grasped and lifted but it falls or the task is completed using alternative grasping strategies (i.e. multi-pulpar, palmar, digito palmar; grasping and releasing of the object is possible with abnormal orientation of the wrist and fingers toward the object and the forearm is lifted off the table). 3 = The task is completed using the expected pattern (normal orientation of fingers or wrist toward the object, the grasp occurs with thumb and fingers in opposition, forearm supination, elbow flexion; thumb abduction and finger extension to release the object).  Patient Score: 3   Pinch and Release (PaR) Movement Starting position: Same starting conditions of GaR movement The participant  performs a PaR movement of a pen placed on a table in the midline of an imaginary line connecting the distal joints of thumb and index finger. The participants asked to pinch the pen with the tips of thumb and index finger, lift it at least 2 cm (elbow remains in contact with the table), and release it. Clinical score from 0 to 3 is provided by comparing affected side with non-affected one as follows: 0 = No movement. The pinch is not possible. 1 = The movement task is not completed (less of 50% of the task). Some prehension is possible but the pinch is not sufficiently stable to lift the object; the pinch occurs with the use of the less affected hand to stabilize the object for inadequate finger opening and the release is not possible. Some fingers movement is required otherwise the score is 0. 2 = The movement task is not completed (more of 50% of the task). The object is pinched and lifted but it falls or the task is completed using alternative pinching strategies (e.g. pinching with all the fingers, tripod pinch, pinching and releasing of the object is possible with abnormal orientation of fingers and wrist toward the object and the forearm is lifted off the table). 3 = The task is completed using the expected pattern (normal orientation of fingers or wrist toward the object, the pinch occurs with opposition of pads of index finger and thumb, and wrist extension).  Patient Score: 3  Total score: 14  Discharge Criteria: Patient will be discharged from OT if patient refuses treatment 3 consecutive times without medical reason, if treatment goals not met, if there is a change in medical status, if patient makes no progress towards goals or if patient is discharged from hospital.  The above assessment, treatment plan, treatment alternatives and goals were discussed and mutually agreed upon: by patient  [name] 04/17/2023, 12:28 PM

## 2023-04-17 NOTE — Progress Notes (Signed)
PROGRESS NOTE   Subjective/Complaints: Complains of cough, tessalon pearles added Depressed, celexa started Glucose 231-259 Albumin 3.2  ROS: +situational depression  Objective:   No results found. Recent Labs    04/17/23 0552  WBC 6.4  HGB 12.8  HCT 39.0  PLT 356   Recent Labs    04/17/23 0552  NA 131*  K 4.0  CL 93*  CO2 27  GLUCOSE 259*  BUN 11  CREATININE 0.71  CALCIUM 9.2    Intake/Output Summary (Last 24 hours) at 04/17/2023 1116 Last data filed at 04/17/2023 0810 Gross per 24 hour  Intake 716 ml  Output --  Net 716 ml        Physical Exam: Vital Signs Blood pressure 137/80, pulse 79, temperature 99 F (37.2 C), resp. rate 16, height 5\' 7"  (1.702 m), weight 113.7 kg, SpO2 97 %. Constitutional:      General: She is not in acute distress.    Appearance: She is obese. BMI 39.26    Comments: Pt awake, alert, appropriate, stopped coughing after drinking water a few times;  ate 100% tray; needs to void; asked for NT, sitting up in bedside chair, NAD  HENT:     Head: Normocephalic and atraumatic.     Comments: Smile equal, but R facial droop- correct more with smile Tongue midline R face numb/decreased sensation to light touch    Right Ear: External ear normal.     Left Ear: External ear normal.     Nose: Congestion and rhinorrhea present.     Mouth/Throat:     Mouth: Mucous membranes are moist.     Pharynx: Oropharynx is clear. No oropharyngeal exudate.  Eyes:     General:        Right eye: No discharge.        Left eye: No discharge.     Extraocular Movements: Extraocular movements intact.     Comments:  Mild nystagmus B/L  Cardiovascular:     Rate and Rhythm: Normal rate and regular rhythm.     Heart sounds: Normal heart sounds. No murmur heard.    No gallop.  Pulmonary:     Effort: Pulmonary effort is normal. No respiratory distress.     Breath sounds: Normal breath sounds. No  wheezing, rhonchi or rales.     Comments: Once stopped coughing, sounded clear to auscultation B/L.  Abdominal:     General: Bowel sounds are normal. There is no distension.     Palpations: Abdomen is soft.     Tenderness: There is no abdominal tenderness.  Musculoskeletal:     Cervical back: Neck supple. No tenderness.     Comments: LUE 5/5 in same muscles RUE- 4/5 proximal; grip 3+/5; FA 2/5 LLE- 5/5 in same muscles RLE- HF 4/5; KE/KF 4-/5; DF/PF 4-/5   Skin:    General: Skin is warm and dry.     Comments: IV L AC fossa- looks OK Trace LE edema to ankles B/L  Neurological:     Mental Status: She is alert.     Comments: Decreased to light touch on R side arm and leg No clonus, but has brisk Hoffman's RUE Difficulty with  slurring words Naming intact and repetition intact- just hard to understand  Psychiatric:        Thought Content: Thought content normal.        Judgment: Judgment normal.     Comments: Tearful frequently during interview    Assessment/Plan: 1. Functional deficits which require 3+ hours per day of interdisciplinary therapy in a comprehensive inpatient rehab setting. Physiatrist is providing close team supervision and 24 hour management of active medical problems listed below. Physiatrist and rehab team continue to assess barriers to discharge/monitor patient progress toward functional and medical goals  Care Tool:  Bathing              Bathing assist       Upper Body Dressing/Undressing Upper body dressing        Upper body assist      Lower Body Dressing/Undressing Lower body dressing            Lower body assist       Toileting Toileting    Toileting assist       Transfers Chair/bed transfer  Transfers assist     Chair/bed transfer assist level: Minimal Assistance - Patient > 75%     Locomotion Ambulation   Ambulation assist      Assist level: Minimal Assistance - Patient > 75% Assistive device: Cane-quad Max  distance: 24ft   Walk 10 feet activity   Assist     Assist level: Minimal Assistance - Patient > 75% Assistive device: Cane-quad   Walk 50 feet activity   Assist    Assist level: Minimal Assistance - Patient > 75% Assistive device: Cane-quad    Walk 150 feet activity   Assist Walk 150 feet activity did not occur: Safety/medical concerns         Walk 10 feet on uneven surface  activity   Assist Walk 10 feet on uneven surfaces activity did not occur: Safety/medical concerns         Wheelchair     Assist Is the patient using a wheelchair?: No             Wheelchair 50 feet with 2 turns activity    Assist            Wheelchair 150 feet activity     Assist          Blood pressure 137/80, pulse 79, temperature 99 F (37.2 C), resp. rate 16, height  (1.702 m), weight 113.7 kg, SpO2 97 %.  Medical Problem List and Plan: 1. 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  Antithrombotics: -DVT/anticoagulation:  Pharmaceutical: Lovenox             -antiplatelet therapy: Aspirin and Plavix for three weeks followed by aspirin alone   3. Pain Management: Tylenol as needed   4. Mood/Behavior/Sleep: LCSW to evaluate and provide emotional support             -Depression>The current medical regimen is effective;  continue present plan and medications. Lexapro was old med- since didn't stay on it, not sure if wasn't helpful- will try Celexa 20 mg daily             -antipsychotic agents: n/a   5. Neuropsych/cognition: This patient is capable of making  decisions on her own behalf.   6. Skin/Wound Care: Routine skin care checks   7. Fluids/Electrolytes/Nutrition: Routine Is and Os and follow-up chemistries             -carb modified diet   8: Hypertension: monitor TID and prn (home Lasix not restarted)             -continue  losartan 50 mg daily   -add on magnesium level 9: Hyperlipidemia: continue statin   10: HIV: continue Biktarvy   11: DM-2: A1c 8.0%; CBGs four times daily; (home meds include Lantus Solostar, Glucotrol XL and Ozempic)             -continue SSI             -continue Semglee 12 units BID             -continue meal coverage 5 units TID   --placed dietary order for no sugar snack tray 12: Moderate persistent asthma: continue Dulera   13: Allergic rhinitis: continue Claritin             -start Flonase   14: Hyponatremia: discussed low Na, monitor weekly   15: Stress urinary incontinence: placed nursing order to take patient to bathroom q2H during the day to minimize incontinence  16. Hypoalbuminemia: Glucerna ordered.   >50 minutes spent in review of chart and labs, discussed hypoalbuminemia, glucerna ordered, q2H bladder program ordered due to stress urinary incontinence, flonase continued due to allergic rhinitis, hyponatremia discussed  LOS: 1 days A FACE TO FACE EVALUATION WAS PERFORMED  Clint Bolder P Jhon Mallozzi 04/17/2023, 11:16 AM

## 2023-04-17 NOTE — Patient Care Conference (Signed)
Inpatient RehabilitationTeam Conference and Plan of Care Update Date: 04/17/2023   Time: 11:15 AM    Patient Name: Latoya Roth      Medical Record Number: [id number]  Date of Birth: [birth year] Sex: Female         Room/Bed: 4W04C/4W04C-01 Payor Info: Payor: [place] [employer]AN / Plan: [place] [employer] [place] / Product Type: *No Product type* /    Admit Date/Time:  04/16/2023  3:07 PM  Primary Diagnosis:  Acute brainstem infarction  Hospital Problems: Principal Problem:   Acute brainstem infarction    Expected Discharge Date: Expected Discharge Date: 04/26/23  Team Members Present: Physician leading conference: Dr. Sula Soda Social Worker Present: Lavera Guise, BSW Nurse Present: Chana Bode, RN PT Present: Casimiro Needle, PT OT Present: Bretta Bang, OT PPS Coordinator present : Fae Pippin, SLP     Current Status/Progress Goal Weekly Team Focus  Bowel/Bladder   Pt is continent of bowel/bladder  Stress incontinence PTA Pt will remain continent of bowel/bladder   Will assess qshift and PRN    Swallow/Nutrition/ Hydration               ADL's   Mod/min   supervision/mod I   discharge planning, BADL, functional mobility    Mobility   eval pending           Communication                Safety/Cognition/ Behavioral Observations               Pain   Pt denies pain   Pt will continue to deny pain   Will assess qshift and PRN    Skin   Pt's skin is intact   Pt's skin will remain intact  Will assess qshift and PRN      Discharge Planning:    1 level apt solo, god sister lives near and can assist prn. Used a single - point care PTA due to neuropathy.  Team Discussion: Patient with emotional lability, cough post left pontine CVA.    Patient on target to meet rehab goals: yes, currently needs mod - max assist overall. Needs min assist to ambulate up to 65' and min assist for steps. Goals for  discharge set for supervision - mod I assist.  *See Care Plan and progress notes for long and short-term goals.   Revisions to Treatment Plan:  Low sugar snacks ordered Tessalon pearls for cough added Neuro - psych referral   Teaching Needs: Safety, medications, dietary modifications, transfers, toileting, etc.   Current Barriers to Discharge: Lack of/limited family support  Possible Resolutions to Barriers: HH follow up services     Medical Summary Current Status: obesity, cough, congestion, type 2 diabetes with hyperglycemia, hypertension, depression, hypoalbuminemia  Barriers to Discharge: Medical stability;Uncontrolled Diabetes  Barriers to Discharge Comments: obesity, cough, congestion, type 2 diabetes with hyperglycemia, hypertension, depression, hypoalbuminemia Possible Resolutions to [employer] Focus: provided dietary education, tessalon ordered, continue flonase, check magnesium level, celexa ordered, encouraged consuming adequate protein   Continued Need for Acute Rehabilitation Level of Care: The patient requires daily medical management by a physician with specialized training in physical medicine and rehabilitation for the following reasons: Direction of a multidisciplinary physical rehabilitation program to maximize functional independence : Yes Medical management of patient stability for increased activity during participation in an intensive rehabilitation regime.: Yes Analysis of laboratory values and/or radiology reports with any subsequent need for medication adjustment and/or medical intervention. :  Yes   I attest that I was present, lead the team conference, and concur with the assessment and plan of the team.   Chana Bode B 04/17/2023, 2:56 PM

## 2023-04-17 NOTE — Plan of Care (Signed)
Problem: RH Balance Goal: LTG Patient will maintain dynamic standing with ADLs (OT) Description: LTG:  Patient will maintain dynamic standing balance with assist during activities of daily living (OT)  Flowsheets (Taken 04/17/2023 1525) LTG: Pt will maintain dynamic standing balance during ADLs with: Independent with assistive device   Problem: Sit to Stand Goal: LTG:  Patient will perform sit to stand in prep for activites of daily living with assistance level (OT) Description: LTG:  Patient will perform sit to stand in prep for activites of daily living with assistance level (OT) Flowsheets (Taken 04/17/2023 1525) LTG: PT will perform sit to stand in prep for activites of daily living with assistance level: Independent with assistive device   Problem: RH Eating Goal: LTG Patient will perform eating w/assist, cues/equip (OT) Description: LTG: Patient will perform eating with assist, with/without cues using equipment (OT) Flowsheets (Taken 04/17/2023 1525) LTG: Pt will perform eating with assistance level of: Independent with assistive device  LTG: Pt will perform eating using equipment: Built up handle   Problem: RH Grooming Goal: LTG Patient will perform grooming w/assist,cues/equip (OT) Description: LTG: Patient will perform grooming with assist, with/without cues using equipment (OT) Flowsheets (Taken 04/17/2023 1525) LTG: Pt will perform grooming with assistance level of: Independent with assistive device    Problem: RH Bathing Goal: LTG Patient will bathe all body parts with assist levels (OT) Description: LTG: Patient will bathe all body parts with assist levels (OT) Flowsheets (Taken 04/17/2023 1525) LTG: Pt will perform bathing with assistance level/cueing: Independent with assistive device    Problem: RH Dressing Goal: LTG Patient will perform upper body dressing (OT) Description: LTG Patient will perform upper body dressing with assist, with/without cues (OT). Flowsheets  (Taken 04/17/2023 1525) LTG: Pt will perform upper body dressing with assistance level of: Independent with assistive device Goal: LTG Patient will perform lower body dressing w/assist (OT) Description: LTG: Patient will perform lower body dressing with assist, with/without cues in positioning using equipment (OT) Flowsheets (Taken 04/17/2023 1525) LTG: Pt will perform lower body dressing with assistance level of: Independent with assistive device   Problem: RH Toileting Goal: LTG Patient will perform toileting task (3/3 steps) with assistance level (OT) Description: LTG: Patient will perform toileting task (3/3 steps) with assistance level (OT)  Flowsheets (Taken 04/17/2023 1525) LTG: Pt will perform toileting task (3/3 steps) with assistance level: Independent with assistive device   Problem: RH Functional Use of Upper Extremity Goal: LTG Patient will use RT/LT upper extremity as a (OT) Description: LTG: Patient will use right/left upper extremity as a stabilizer/gross assist/diminished/nondominant/dominant level with assist, with/without cues during functional activity (OT) Flowsheets (Taken 04/17/2023 1525) LTG: Use of upper extremity in functional activities: RUE as diminished level LTG: Pt will use upper extremity in functional activity with assistance level of: Supervision/Verbal cueing   Problem: RH Simple Meal Prep Goal: LTG Patient will perform simple meal prep w/assist (OT) Description: LTG: Patient will perform simple meal prep with assistance, with/without cues (OT). Flowsheets (Taken 04/17/2023 1525) LTG: Pt will perform simple meal prep with assistance level of: Supervision/Verbal cueing LTG: Pt will perform simple meal prep w/level of: Ambulate with device   Problem: RH Light Housekeeping Goal: LTG Patient will perform light housekeeping w/assist (OT) Description: LTG: Patient will perform light housekeeping with assistance, with/without cues (OT). Flowsheets (Taken 04/17/2023  1525) LTG: Pt will perform light housekeeping with assistance level of: Supervision/Verbal cueing LTG: Pt will perform light housekeeping w/level of: Ambulate with device   Problem: RH Toilet  Transfers Goal: LTG Patient will perform toilet transfers w/assist (OT) Description: LTG: Patient will perform toilet transfers with assist, with/without cues using equipment (OT) Flowsheets (Taken 04/17/2023 1525) LTG: Pt will perform toilet transfers with assistance level of: Independent with assistive device   Problem: RH Tub/Shower Transfers Goal: LTG Patient will perform tub/shower transfers w/assist (OT) Description: LTG: Patient will perform tub/shower transfers with assist, with/without cues using equipment (OT) Flowsheets (Taken 04/17/2023 1525) LTG: Pt will perform tub/shower stall transfers with assistance level of: Supervision/Verbal cueing   Problem: RH Memory Goal: LTG Patient will demonstrate ability for day to day recall/carry over during activities of daily living with assistance level (OT) Description: LTG:  Patient will demonstrate ability for day to day recall/carry over during activities of daily living with assistance level (OT). Flowsheets (Taken 04/17/2023 1525) LTG:  Patient will demonstrate ability for day to day recall/carry over during activities of daily living with assistance level (OT): Independent

## 2023-04-17 NOTE — Plan of Care (Signed)
  Problem: RH Balance Goal: LTG Patient will maintain dynamic standing balance (PT) Description: LTG:  Patient will maintain dynamic standing balance with assistance during mobility activities (PT) Flowsheets (Taken 04/17/2023 1117) LTG: Pt will maintain dynamic standing balance during mobility activities with:: Supervision/Verbal cueing   Problem: Sit to Stand Goal: LTG:  Patient will perform sit to stand with assistance level (PT) Description: LTG:  Patient will perform sit to stand with assistance level (PT) Flowsheets (Taken 04/17/2023 1117) LTG: PT will perform sit to stand in preparation for functional mobility with assistance level: Independent with assistive device   Problem: RH Bed Mobility Goal: LTG Patient will perform bed mobility with assist (PT) Description: LTG: Patient will perform bed mobility with assistance, with/without cues (PT). Flowsheets (Taken 04/17/2023 1117) LTG: Pt will perform bed mobility with assistance level of: Independent with assistive device    Problem: RH Bed to Chair Transfers Goal: LTG Patient will perform bed/chair transfers w/assist (PT) Description: LTG: Patient will perform bed to chair transfers with assistance (PT). Flowsheets (Taken 04/17/2023 1117) LTG: Pt will perform Bed to Chair Transfers with assistance level: Independent with assistive device    Problem: RH Car Transfers Goal: LTG Patient will perform car transfers with assist (PT) Description: LTG: Patient will perform car transfers with assistance (PT). Flowsheets (Taken 04/17/2023 1117) LTG: Pt will perform car transfers with assist:: Contact Guard/Touching assist   Problem: RH Ambulation Goal: LTG Patient will ambulate in controlled environment (PT) Description: LTG: Patient will ambulate in a controlled environment, # of feet with assistance (PT). Flowsheets (Taken 04/17/2023 1117) LTG: Pt will ambulate in controlled environ  assist needed:: Supervision/Verbal cueing LTG:  Ambulation distance in controlled environment: 134ft with LRAD Goal: LTG Patient will ambulate in home environment (PT) Description: LTG: Patient will ambulate in home environment, # of feet with assistance (PT). Flowsheets (Taken 04/17/2023 1117) LTG: Pt will ambulate in home environ  assist needed:: Supervision/Verbal cueing LTG: Ambulation distance in home environment: 72ft with LRAD   Problem: RH Stairs Goal: LTG Patient will ambulate up and down stairs w/assist (PT) Description: LTG: Patient will ambulate up and down # of stairs with assistance (PT) Flowsheets (Taken 04/17/2023 1117) LTG: Pt will ambulate up/down stairs assist needed:: Contact Guard/Touching assist LTG: Pt will  ambulate up and down number of stairs: at least 4 steps to improve community mobility

## 2023-04-17 NOTE — Evaluation (Signed)
Physical Therapy Assessment and Plan  Patient Details  Name: Latoya Roth MRN: [id number] Date of Birth: 10-11-1971  PT Diagnosis: Abnormality of gait, Difficulty walking, Hemiplegia dominant, and Muscle weakness Rehab Potential: Good ELOS: 7-9 days   Today's Date: 04/17/2023 PT Individual Time: 1010-1110 PT Individual Time Calculation (min): 60 min    Hospital Problem: Principal Problem:   Acute brainstem infarction   Past Medical History:  Past Medical History:  Diagnosis Date   Anxiety    Arthritis    Asthma    Bell's palsy    BV (bacterial vaginosis)    Depression    Diabetes mellitus    HIV disease    Hypertension    Obesity    Past Surgical History:  Past Surgical History:  Procedure Laterality Date   CHOLECYSTECTOMY      Assessment & Plan Clinical Impression: Patient is a 52 year old R handed female who presented to the ED on 04/11/2023 complaining of right-sided weakness and right-facial droop that started four days prior to presentation. Head CT obtained with strong suspicion for a pontine infarct which was followed by an MRI that confirmed a subacute left paramedian pons infarct. Neurology was consulted for stroke workup. Does not use tobacco. Drinks about 2 alcoholic servings per day. Medical history significant for hypertension, HIV on HARRT therapy, DM-2. Vitals were relatively stable.  Labs showed sodium slightly low at 135.  Urine drug screen was negative.  Urinalysis was negative as well.  EKG showed normal sinus rhythm. Hgb A1c 8.0%. LDL 92. 2D echo 55-60%. Plavix and aspirin started. Continue for 3 weeks then aspirin alone. Diabetic coordinator consulted. Lovenox for DVT prophy. She is requiring mod A to prevent loss of balance. Tolerating carb modified diet. The patient requires inpatient medicine and rehabilitation evaluations and services for ongoing dysfunction secondary to left pons infarct.    Patient transferred to [facility] on 04/16/2023 .   Patient  currently requires min with mobility secondary to muscle weakness, decreased cardiorespiratoy endurance, and decreased standing balance, hemiplegia, and decreased balance strategies.  Prior to hospitalization, patient was modified independent  with mobility and lived with Alone in a Apartment home.  Home access is  Level entry.  Patient will benefit from skilled PT intervention to maximize safe functional mobility, minimize fall risk, and decrease caregiver burden for planned discharge home with intermittent assist.  Anticipate patient will benefit from follow up OP at discharge.  PT - End of Session Activity Tolerance: Tolerates 10 - 20 min activity with multiple rests Endurance Deficit: Yes PT Assessment Rehab Potential (ACUTE/IP ONLY): Good PT Barriers to Discharge: Decreased caregiver support;Weight;Insurance for SNF coverage PT Patient demonstrates impairments in the following area(s): Balance;Endurance;Safety PT Transfers Functional Problem(s): Bed Mobility;Car;Bed to Chair PT Locomotion Functional Problem(s): Ambulation;Stairs PT Plan PT Intensity: Minimum of 1-2 x/day ,45 to 90 minutes PT Frequency: 5 out of 7 days PT Duration Estimated Length of Stay: 7-9 days PT Treatment/Interventions: Ambulation/gait training;Balance/vestibular training;Community reintegration;Discharge planning;Disease management/prevention;DME/adaptive equipment instruction;Functional electrical stimulation;Functional mobility training;Neuromuscular re-education;Pain management;Patient/family education;Psychosocial support;Splinting/orthotics;Stair training;Therapeutic Activities;Therapeutic Exercise;UE/LE Strength taining/ROM;UE/LE Coordination activities;Visual/perceptual remediation/compensation;Wheelchair propulsion/positioning PT Transfers Anticipated Outcome(s): mod I PT Locomotion Anticipated Outcome(s): supervision PT Recommendation Recommendations for Other Services: Therapeutic Recreation  consult Therapeutic Recreation Interventions: Stress management Follow Up Recommendations: Home health PT Patient destination: Home Equipment Recommended: To be determined   PT Evaluation Precautions/Restrictions Precautions Precautions: Fall Precaution Comments: HIV; R hemi Restrictions Weight Bearing Restrictions: No General   Vital Signs  Pain Pain Assessment Pain Scale: 0-10  Pain Score: 0-No pain Pain Interference Pain Interference Pain Effect on Sleep: 2. Occasionally Pain Interference with Therapy Activities: 0. Does not apply - I have not received rehabilitationtherapy in the past 5 days Pain Interference with Day-to-Day Activities: 1. Rarely or not at all Home Living/Prior Functioning Home Living Available Help at Discharge: Other (Comment);Family Therapist, sports and her Son can check on her intermittently during the day) Type of Home: Apartment Home Access: Level entry Home Layout: One level Bathroom Shower/Tub: Engineer, manufacturing systems: Standard Bathroom Accessibility: Yes Additional Comments: used a cane prior due to circulation problems in left leg  Lives With: Alone Prior Function Level of Independence: Independent with basic ADLs;Requires assistive device for independence;Independent with transfers;Independent with gait  Able to Take Stairs?: No Driving: No Vocation: On disability Vision/Perception  Vision - History Ability to See in Adequate Light: 0 Adequate Vision - Assessment Eye Alignment: Within Functional Limits Ocular Range of Motion: Within Functional Limits Alignment/Gaze Preference: Within Defined Limits Tracking/Visual Pursuits: Able to track stimulus in all quads without difficulty Saccades: Within functional limits Convergence: Within functional limits Perception Perception: Within Functional Limits Praxis Praxis: Intact  Cognition Overall Cognitive Status: Within Functional Limits for tasks assessed Arousal/Alertness:  Awake/alert Orientation Level: Oriented X4 Attention: Selective;Alternating Focused Attention: Appears intact Sustained Attention: Appears intact Selective Attention: Appears intact Memory: Impaired Memory Impairment: Retrieval deficit Awareness: Impaired Awareness Impairment: Anticipatory impairment Problem Solving: Appears intact Executive Function: Organizing;Initiating;Self Monitoring;Self Correcting Sequencing: Appears intact Sequencing Impairment: Functional basic Organizing: Appears intact Organizing Impairment: Functional basic Initiating: Appears intact Self Monitoring: Appears intact Self Correcting: Appears intact Safety/Judgment: Appears intact Comments: Will continue to assess in a functional context Sensation Sensation Light Touch: Appears Intact Hot/Cold: Appears Intact Proprioception: Appears Intact Stereognosis: Not tested Additional Comments: Diminished sensation on R side but able to discern light touch Coordination Gross Motor Movements are Fluid and Coordinated: No Fine Motor Movements are Fluid and Coordinated: No Coordination and Movement Description: R hemi Finger Nose Finger Test: dysmetric movement Motor  Motor Motor: Hemiplegia   Trunk/Postural Assessment  Cervical Assessment Cervical Assessment: Within Functional Limits Thoracic Assessment Thoracic Assessment: Within Functional Limits Lumbar Assessment Lumbar Assessment: Within Functional Limits Postural Control Postural Control: Deficits on evaluation  Balance Balance Balance Assessed: Yes Standardized Balance Assessment Standardized Balance Assessment: Berg Balance Test;Timed Up and Go Test Berg Balance Test Sit to Stand: Able to stand using hands after several tries Standing Unsupported: Able to stand 2 minutes with supervision Sitting with Back Unsupported but Feet Supported on Floor or Stool: Able to sit safely and securely 2 minutes Stand to Sit: Uses backs of legs against chair  to control descent Transfers: Needs one person to assist Standing Unsupported with Eyes Closed: Able to stand 10 seconds with supervision Standing Ubsupported with Feet Together: Needs help to attain position but able to stand for 30 seconds with feet together From Standing, Reach Forward with Outstretched Arm: Loses balance while trying/requires external support From Standing Position, Pick up Object from Floor: Unable to try/needs assist to keep balance From Standing Position, Turn to Look Behind Over each Shoulder: Needs assist to keep from losing balance and falling Turn 360 Degrees: Needs assistance while turning Standing Unsupported, Alternately Place Feet on Step/Stool: Needs assistance to keep from falling or unable to try Standing Unsupported, One Foot in Front: Loses balance while stepping or standing Standing on One Leg: Unable to try or needs assist to prevent fall Total Score: 16 Timed Up and Go Test TUG: Normal TUG Normal  TUG (seconds): 72 Static Sitting Balance Static Sitting - Balance Support: No upper extremity supported;Feet supported Static Sitting - Level of Assistance: 7: Independent Dynamic Sitting Balance Dynamic Sitting - Balance Support: No upper extremity supported;During functional activity;Feet supported Dynamic Sitting - Level of Assistance: 5: Stand by assistance Dynamic Sitting - Balance Activities: Lateral lean/weight shifting;Forward lean/weight shifting;Reaching for Higher education careers adviser Standing - Balance Support: Bilateral upper extremity supported;During functional activity Static Standing - Level of Assistance: 4: Min assist Dynamic Standing Balance Dynamic Standing - Balance Support: Bilateral upper extremity supported;During functional activity Dynamic Standing - Level of Assistance: 4: Min assist Dynamic Standing - Balance Activities: Lateral lean/weight shifting;Forward lean/weight shifting;Reaching for objects Extremity  Assessment  RUE Assessment RUE Assessment: Exceptions to [facility] Active Range of Motion (AROM) Comments: slighty limited range above 115* - can achieve 140* but shows compensatory patterns General Strength Comments: 4-/5 shoulder, 4/5 grip RUE Body System: Neuro Brunstrum levels for arm and hand: Arm;Hand Brunstrum level for arm: Stage IV Movement is deviating from synergy Brunstrum level for hand: Stage V Independence from basic synergies RUE Tone RUE Tone: Mild;Hypertonic Hypertonic Details: elbow flexors LUE Assessment LUE Assessment: Within Functional Limits RLE Assessment RLE Assessment: Exceptions to [facility] General Strength Comments: Grossly 4/5 LLE Assessment LLE Assessment: Within Functional Limits  Care Tool Care Tool Bed Mobility Roll left and right activity   Roll left and right assist level: Supervision/Verbal cueing    Sit to lying activity   Sit to lying assist level: Minimal Assistance - Patient > 75%    Lying to sitting on side of bed activity   Lying to sitting on side of bed assist level: the ability to move from lying on the back to sitting on the side of the bed with no back support.: Minimal Assistance - Patient > 75%     Care Tool Transfers Sit to stand transfer   Sit to stand assist level: Minimal Assistance - Patient > 75%    Chair/bed transfer   Chair/bed transfer assist level: Minimal Assistance - Patient > 75%     Scientist, research (physical sciences) transfer activity did not occur: Safety/medical concerns (fatigue)        Care Tool Locomotion Ambulation   Assist level: Minimal Assistance - Patient > 75% Assistive device: Cane-quad Max distance: 45ft  Walk 10 feet activity   Assist level: Minimal Assistance - Patient > 75% Assistive device: Cane-quad   Walk 50 feet with 2 turns activity   Assist level: Minimal Assistance - Patient > 75% Assistive device: Cane-quad  Walk 150 feet activity Walk 150 feet activity did not occur:  Safety/medical concerns      Walk 10 feet on uneven surfaces activity Walk 10 feet on uneven surfaces activity did not occur: Safety/medical concerns      Stairs   Assist level: Minimal Assistance - Patient > 75% Stairs assistive device: 2 hand rails Max number of stairs: 4  Walk up/down 1 step activity   Walk up/down 1 step (curb) assist level: Minimal Assistance - Patient > 75% Walk up/down 1 step or curb assistive device: 2 hand rails  Walk up/down 4 steps activity   Walk up/down 4 steps assist level: Minimal Assistance - Patient > 75% Walk up/down 4 steps assistive device: 2 hand rails  Walk up/down 12 steps activity Walk up/down 12 steps activity did not occur: Safety/medical concerns      Pick up small objects from floor  Pick up small object from the floor assist level: Moderate Assistance - Patient 50 - 74%    Wheelchair Is the patient using a wheelchair?: No          Wheel 50 feet with 2 turns activity      Wheel 150 feet activity        Refer to Care Plan for Long Term Goals  SHORT TERM GOAL WEEK 1 PT Short Term Goal 1 (Week 1): STG = LTG due to ELOS  Recommendations for other services: Therapeutic Recreation  Stress management  Skilled Therapeutic Intervention Mobility Transfers Transfers: Sit to Stand;Stand to Sit;Stand Pivot Transfers Sit to Stand: Minimal Assistance - Patient > 75% Stand to Sit: Minimal Assistance - Patient > 75% Stand Pivot Transfers: Minimal Assistance - Patient > 75% Stand Pivot Transfer Details: Verbal cues for technique;Verbal cues for precautions/safety;Tactile cues for weight shifting;Manual facilitation for weight shifting;Tactile cues for initiation Transfer (Assistive device): None Locomotion  Gait Ambulation: Yes Gait Assistance: Minimal Assistance - Patient > 75% Gait Distance (Feet): 65 Feet Assistive device: Small based quad cane Gait Assistance Details: Verbal cues for safe use of DME/AE;Verbal cues for gait  pattern;Verbal cues for precautions/safety;Verbal cues for technique;Verbal cues for sequencing;Manual facilitation for weight shifting;Tactile cues for posture Gait Gait: Yes Gait Pattern: Impaired Gait Pattern: Step-to pattern;Decreased step length - right;Decreased hip/knee flexion - right;Decreased stance time - right;Decreased weight shift to right;Trunk flexed;Poor foot clearance - right Gait velocity: Decreased Stairs / Additional Locomotion Stairs: Yes Stairs Assistance: Minimal Assistance - Patient > 75% Stair Management Technique: Two rails;Step to pattern;Forwards Number of Stairs: 4 Height of Stairs: 6 Wheelchair Mobility Wheelchair Mobility: No  Skilled Intervention: Pt sitting in wheelchair to start - agreeable to PT evaluation. Pt pleasant and cooperative during assessment, A&Ox4, no cognitive or visual impairments noted. Initiated functional mobility as outlined above; grossly requires minA overall while using her QC for mobility. She's primarily limited by R sided weakness, generalized deconditioning, impaired dynamic standing balance, and fear of falling. Completed BERG and TUG for functional outcome measures to assess falls risk. Concluded session seated in w/c with all needs met and call bell in reach.   Instructed pt in results of PT evaluation as detailed above, PT POC, rehab potential, rehab goals, and discharge recommendations. Additionally discussed [facility]'s policies regarding fall safety and use of chair alarm and/or quick release belt. Pt verbalized understanding and in agreement. Will update pt's family members as they become available.   Discharge Criteria: Patient will be discharged from PT if patient refuses treatment 3 consecutive times without medical reason, if treatment goals not met, if there is a change in medical status, if patient makes no progress towards goals or if patient is discharged from hospital.  The above assessment, treatment plan, treatment  alternatives and goals were discussed and mutually agreed upon: by patient  Orrin Brigham  PT, DPT, CSRS  04/17/2023, 11:15 AM

## 2023-04-17 NOTE — Progress Notes (Signed)
Inpatient Rehabilitation  Patient information reviewed and entered into eRehab system by Kaeli Nichelson M. Anastazia Creek, M.A., CCC/SLP, PPS Coordinator.  Information including medical coding, functional ability and quality indicators will be reviewed and updated through discharge.    

## 2023-04-18 DIAGNOSIS — I6389 Other cerebral infarction: Secondary | ICD-10-CM | POA: Diagnosis not present

## 2023-04-18 LAB — GLUCOSE, CAPILLARY
Glucose-Capillary: 209 mg/dL — ABNORMAL HIGH (ref 70–99)
Glucose-Capillary: 236 mg/dL — ABNORMAL HIGH (ref 70–99)
Glucose-Capillary: 360 mg/dL — ABNORMAL HIGH (ref 70–99)
Glucose-Capillary: 157 mg/dL — ABNORMAL HIGH (ref 70–99)

## 2023-04-18 MED ORDER — MAGNESIUM SULFATE 2 GM/50ML IV SOLN
2.0000 g | Freq: Once | INTRAVENOUS | Status: AC
  Administered 2023-04-18: 2 g via INTRAVENOUS
  Filled 2023-04-18: qty 50

## 2023-04-18 MED ORDER — GLUCERNA SHAKE PO LIQD
237.0000 mL | Freq: Two times a day (BID) | ORAL | Status: DC
  Administered 2023-04-18 (×2): 237 mL via ORAL

## 2023-04-18 NOTE — Progress Notes (Signed)
Inpatient Rehabilitation Care Coordinator Assessment and Plan Patient Details  Name: Latoya Roth MRN: [id number] Date of Birth: 23-Nov-1971  Today's Date: 04/18/2023  Hospital Problems: Principal Problem:   Acute brainstem infarction  Past Medical History:  Past Medical History:  Diagnosis Date   Anxiety    Arthritis    Asthma    Bell's palsy    BV (bacterial vaginosis)    Depression    Diabetes mellitus    HIV disease    Hypertension    Obesity    Past Surgical History:  Past Surgical History:  Procedure Laterality Date   CHOLECYSTECTOMY     Social History:  reports that she has quit smoking. She has never used smokeless tobacco. She reports current alcohol use of about 14.0 standard drinks of alcohol per week. She reports that she does not use drugs.  Family / Support Systems Marital Status: Single Spouse/Significant Other: N/A Children: Son Other Supports: God Sister, Brooks Sailors Anticipated Caregiver: God Sister, Brooks Sailors Ability/Limitations of Caregiver: N/A Caregiver Availability: 24/7 Family Dynamics: support from God sister and son  Social History Preferred language: English Religion: None Education: HS Health Literacy - How often do you need to have someone help you when you read instructions, pamphlets, or other written material from your doctor or pharmacy?: Never Writes: Yes   Abuse/Neglect Abuse/Neglect Assessment Can Be Completed: Yes Physical Abuse: Denies Verbal Abuse: Denies Sexual Abuse: Denies Exploitation of patient/patient's resources: Denies Self-Neglect: Denies  Patient response to: Social Isolation - How often do you feel lonely or isolated from those around you?: Never  Emotional Status Pt's affect, behavior and adjustment status: Pleasant, patient experiecing coughing episodes. Sw informed MD Recent Psychosocial Issues: coping Psychiatric History: hx of depression Substance Abuse History: N/A  Patient / Family  Perceptions, Expectations & Goals Pt/Family understanding of illness & functional limitations: yes Premorbid pt/family roles/activities: Independent overall Anticipated changes in roles/activities/participation: Anticipating assistance from god sistter but lives alone. Pt/family expectations/goals: Supervision  [employer]: None Premorbid Home Care/DME Agencies: None Transportation available at discharge: Family able to transport Is the patient able to respond to transportation needs?: Yes In the past 12 months, has lack of transportation kept you from medical appointments or from getting medications?: No In the past 12 months, has lack of transportation kept you from meetings, work, or from getting things needed for daily living?: No Resource referrals recommended: Neuropsychology  Discharge Planning Living Arrangements: Alone Support Systems: Children, Other relatives, Friends/neighbors Type of Residence: Private residence (Level entry apartment) Insurance Resources: [employer] (specify county) Surveyor, quantity Screen Referred: No Living Expenses: Psychologist, sport and exercise Management: Patient Does the patient have any problems obtaining your medications?: No Home Management: Independent Patient/Family Preliminary Plans: Plans to remain independennt Care Coordinator Barriers to Discharge: Inaccessible home environment, Decreased caregiver support, Lack of/limited family support, Insurance for SNF coverage Care Coordinator Anticipated Follow Up Needs: HH/OP Expected length of stay: 7-10 Days  Clinical Impression 4/24  Covering for primary Sw, becky D. Sw met with patient, introduced self and explained role. Patient lives alone in a 1 level apartment. Patient anticipating assistance form her God sister. Sw will complete full assessment tomorrow. Patient coughing uncontrollably and tearful and reports that she has treatments at home for her bronchitis. Sw will follow up with  physician.     Andria Rhein 04/18/2023, 2:11 PM

## 2023-04-18 NOTE — Progress Notes (Signed)
PROGRESS NOTE   Subjective/Complaints: Nursing order placed for diabetic snack tray as per patient's request, educated patient regarding choosing high protein foods/snacks to minimize elevated CBGs.   ROS: +situational depression, +shortness of breath  Objective:   No results found. Recent Labs    04/17/23 0552  WBC 6.4  HGB 12.8  HCT 39.0  PLT 356   Recent Labs    04/17/23 0552  NA 131*  K 4.0  CL 93*  CO2 27  GLUCOSE 259*  BUN 11  CREATININE 0.71  CALCIUM 9.2    Intake/Output Summary (Last 24 hours) at 04/18/2023 1731 Last data filed at 04/18/2023 1256 Gross per 24 hour  Intake 535 ml  Output --  Net 535 ml        Physical Exam: Vital Signs Blood pressure 124/89, pulse 74, temperature 97.8 F (36.6 C), temperature source Oral, resp. rate 16, height  (1.702 m), weight 113.7 kg, SpO2 96 %. Constitutional:      General: She is not in acute distress.    Appearance: She is obese. BMI 39.26    Comments: Pt awake, alert, appropriate, stopped coughing after drinking water a few times;  ate 100% tray; needs to void; asked for NT, sitting up in bedside chair, NAD  HENT:     Head: Normocephalic and atraumatic.     Comments: Smile equal, but R facial droop- correct more with smile Tongue midline R face numb/decreased sensation to light touch    Right Ear: External ear normal.     Left Ear: External ear normal.     Nose: Congestion and rhinorrhea present.     Mouth/Throat:     Mouth: Mucous membranes are moist.     Pharynx: Oropharynx is clear. No oropharyngeal exudate.  Eyes:     General:        Right eye: No discharge.        Left eye: No discharge.     Extraocular Movements: Extraocular movements intact.     Comments:  Mild nystagmus B/L  Cardiovascular:     Rate and Rhythm: Normal rate and regular rhythm.     Heart sounds: Normal heart sounds. No murmur heard.    No gallop.  Pulmonary:      Effort: Pulmonary effort is normal. No respiratory distress.     Breath sounds: Normal breath sounds. No wheezing, rhonchi or rales.     Comments: Once stopped coughing, sounded clear to auscultation B/L.  Abdominal:     General: Bowel sounds are normal. There is no distension.     Palpations: Abdomen is soft.     Tenderness: There is no abdominal tenderness.  Musculoskeletal:     Cervical back: Neck supple. No tenderness.     Comments: LUE 5/5 in same muscles RUE- 4/5 proximal; grip 3+/5; FA 2/5 LLE- 5/5 in same muscles RLE- HF 4/5; KE/KF 4-/5; DF/PF 4-/5   Skin:    General: Skin is warm and dry.     Comments: IV L AC fossa- looks OK Trace LE edema to ankles B/L  Neurological:     Mental Status: She is alert.     Comments: Decreased to light  touch on R side arm and leg No clonus, but has brisk Hoffman's RUE Difficulty with slurring words Naming intact and repetition intact- just hard to understand  Ambulating with NBQC with minA  Psychiatric:        Thought Content: Thought content normal.        Judgment: Judgment normal.     Comments: Tearful frequently during interview    Assessment/Plan: 1. Functional deficits which require 3+ hours per day of interdisciplinary therapy in a comprehensive inpatient rehab setting. Physiatrist is providing close team supervision and 24 hour management of active medical problems listed below. Physiatrist and rehab team continue to assess barriers to discharge/monitor patient progress toward functional and medical goals  Care Tool:  Bathing    Body parts bathed by patient: Right arm, Chest, Abdomen, Right upper leg, Left upper leg, Face   Body parts bathed by helper: Front perineal area, Buttocks, Left lower leg, Right lower leg     Bathing assist Assist Level: Moderate Assistance - Patient 50 - 74%     Upper Body Dressing/Undressing Upper body dressing   What is the patient wearing?: Pull over shirt    Upper body assist Assist  Level: Minimal Assistance - Patient > 75%    Lower Body Dressing/Undressing Lower body dressing      What is the patient wearing?: Pants, Underwear/pull up     Lower body assist Assist for lower body dressing: Moderate Assistance - Patient 50 - 74%     Toileting Toileting    Toileting assist Assist for toileting: Moderate Assistance - Patient 50 - 74%     Transfers Chair/bed transfer  Transfers assist     Chair/bed transfer assist level: Minimal Assistance - Patient > 75%     Locomotion Ambulation   Ambulation assist      Assist level: Minimal Assistance - Patient > 75% Assistive device: Cane-quad Max distance: 50ft   Walk 10 feet activity   Assist     Assist level: Minimal Assistance - Patient > 75% Assistive device: Cane-quad   Walk 50 feet activity   Assist    Assist level: Minimal Assistance - Patient > 75% Assistive device: Cane-quad    Walk 150 feet activity   Assist Walk 150 feet activity did not occur: Safety/medical concerns         Walk 10 feet on uneven surface  activity   Assist Walk 10 feet on uneven surfaces activity did not occur: Safety/medical concerns         Wheelchair     Assist Is the patient using a wheelchair?: No             Wheelchair 50 feet with 2 turns activity    Assist            Wheelchair 150 feet activity     Assist          Blood pressure 124/89, pulse 74, temperature 97.8 F (36.6 C), temperature source Oral, resp. rate 16, height 5\' 7"  (1.702 m), weight 113.7 kg, SpO2 96 %.  Medical Problem List and Plan: 1. 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  Antithrombotics: -DVT/anticoagulation:  Pharmaceutical: Lovenox             -antiplatelet therapy: Aspirin and Plavix  for three weeks followed by aspirin alone   3. Pain Management: Tylenol as  needed   4. Mood/Behavior/Sleep: LCSW to evaluate and provide emotional support             -Depression>The current medical regimen is effective;  continue present plan and medications. Lexapro was old med- since didn't stay on it, not sure if wasn't helpful- will try Celexa 20 mg daily             -antipsychotic agents: n/a   5. Neuropsych/cognition: This patient is capable of making decisions on her own behalf.   6. Skin/Wound Care: Routine skin care checks   7. Fluids/Electrolytes/Nutrition: Routine Is and Os and follow-up chemistries             -carb modified diet   8: Hypertension: monitor TID and prn (home Lasix not restarted)             -continue losartan 50 mg daily   Supplement 2grams IV magnesium on 4/25 9: Hyperlipidemia: continue statin   10: HIV: continue Biktarvy   11: DM-2: A1c 8.0%; CBGs four times daily; (home meds include Lantus Solostar, Glucotrol XL and Ozempic)             -continue SSI             -continue Semglee 12 units BID             -continue meal coverage 5 units TID   --placed dietary order for no sugar snack tray  -glucerna decreased to BID 12: Moderate persistent asthma: continue Dulera. Nebulizer ordered   13: Allergic rhinitis: continue Claritin             -start Flonase   14: Hyponatremia: discussed low Na, monitor weekly   15: Stress urinary incontinence: placed nursing order to take patient to bathroom q2H during the day to minimize incontinence  16. Hypoalbuminemia: Glucerna ordered.     LOS: 2 days A FACE TO FACE EVALUATION WAS PERFORMED  Horton Chin 04/18/2023, 5:31 PM

## 2023-04-18 NOTE — Progress Notes (Signed)
Initial Nutrition Assessment  DOCUMENTATION CODES:   Obesity unspecified  INTERVENTION:  Continue carb modified diet as ordered Diet compliant variety of snacks ordered TID between meals Glucerna Shake po BID, each supplement provides 220 kcal and 10 grams of protein  NUTRITION DIAGNOSIS:   Increased nutrient needs related to acute illness as evidenced by estimated needs  GOAL:   Patient will meet greater than or equal to 90% of their needs  MONITOR:   PO intake, Supplement acceptance, Weight trends, Labs, Diet advancement  REASON FOR ASSESSMENT:   Consult Other (Comment) (Sugar free snack track)  ASSESSMENT:   Pt admitted with functional deficits 2/2 L paramedian pons infarct. PMH significant for HTN, HIV on HAART, T2DM.  Attempted to speak with pt however, she was on the phone with an outside medical provider. Unable to obtain nutrition related history or snack preferences at this time. Pt noted to eat well up to this point of admission. RD received message from RN stating pt continues to feel hungry and has been snacking between meals. Variety of consistent carb snacks ordered TID, in addition to noted Glucerna shakes ordered by provider.   Meal completions of 100% x6 recorded meals.   Reviewed weight history which appears that pt's weight has remained stable between 113-115 kg.    Medications: biktarvy, SSI 0-20 units TID, SSI 0-5 units qhs, SSI 5 units TID, semglee 12 units BID  Labs: CBG's 179-236 x24 hours, HgbA1c 8.0%  NUTRITION - FOCUSED PHYSICAL EXAM: Deferred to follow up.   Diet Order:   Diet Order             Diet Carb Modified Fluid consistency: Thin; Room service appropriate? Yes  Diet effective now                   EDUCATION NEEDS:   No education needs have been identified at this time  Skin:  Skin Assessment: Reviewed RN Assessment  Last BM:  4/25  Height:   Ht Readings from Last 1 Encounters:  04/16/23  (1.702 m)     Weight:   Wt Readings from Last 1 Encounters:  04/16/23 113.7 kg    Ideal Body Weight:  61.4 kg  BMI:  Body mass index is 39.26 kg/m.  Estimated Nutritional Needs:   Kcal:  1700-1900  Protein:  90-105g  Fluid:  >/=1.7L  Drusilla Kanner, RDN, LDN Clinical Nutrition

## 2023-04-18 NOTE — Progress Notes (Signed)
Physical Therapy Session Note  Patient Details  Name: Latoya Roth MRN: [id number] Date of Birth: 01-09-1971  Today's Date: 04/18/2023 PT Individual Time: 0905-1016 PT Individual Time Calculation (min): 71 min   Short Term Goals: Week 1:  PT Short Term Goal 1 (Week 1): STG = LTG due to ELOS  Skilled Therapeutic Interventions/Progress Updates:    Pt presents in room in recliner, agreeable to PT, denies pain at this time. Pt requesting to wash up prior to leaving room. Session focused on self care tasks with use of RUE and with sitting/standing balance with and without UE support as well as gait training with decreasing UE support and with multidirectional stepping. Pt completes sit<>stand transfers with CGA/min assist throughout session.  Pt ambulates from recliner to WC 5' with R HHA CGA, completes upper body washing in sitting using RUE with close to distant supervision seated at sink. Pt able to manage using RUE for brushing teeth, applying soap to washrags, and using washrag to wash L side of body. Pt completes lower body washing with RUE in sitting position reaching to floor in sitting position for washing lower legs and feet, able to position BLEs in figure 4. Pt completes stand with unilateral UE support at sink for washing anterior/posterior periarea with BUEs, increased time to complete requires CGA for standing balance with task. Pt completes upper body dressing, threading brief/pants, and donning socks in sitting, pt stands to pull pants over hips in standing with CGA. Pt provided with verbal/tactile cueing and encouragement for completing tasks with RUE and without assist from therapist.  Pt transported from room to gym in [facility] dependently for time management. Pt then participates in gait training with quad cane 2x41' with CGA/min assist, x1 instance R knee buckling with pt able to self correct. Pt prefers to use quad cane in RUE for first trial due to baseline mobility, pt  demonstrating increased postural sway with R stance phase and instructed to trial quad cane in LUE for 2nd trial. Pt reports feeling improved stability with use of quad cane with LUE. Pt ambulates 2x85' with quad cane, x2 instance R knee buckle slow gait speed with first trial. Pt provided with figure 8 wrap to R knee to improve proprioception, decrease buckling, and improve confidence with balance with pt demonstrating improved gait speed and stability with 2nd gait trial. Pt instructed in completing forward/backward walking in //bars no UE support 3x8', requires encouragement and education to participate with task due to pt with increased fear of falling with task, pt completes with CGA/min assist.  Pt transported back to room dependently in [facility] for time management, ambulates to recliner and remains seated in recliner with all needs within reach, call light in place, and posey belt donned and activated at end of session.  Therapy Documentation Precautions:  Precautions Precautions: Fall Precaution Comments: HIV; R hemi Restrictions Weight Bearing Restrictions: No    Therapy/Group: Individual Therapy  Edwin Cap PT, DPT 04/18/2023, 12:43 PM

## 2023-04-18 NOTE — Progress Notes (Signed)
Physical Therapy Session Note  Patient Details  Name: Latoya Roth MRN: [id number] Date of Birth: 17-Jul-1971  Today's Date: 04/18/2023 PT Individual Time: 1055-1203 PT Individual Time Calculation (min): 68 min   Short Term Goals: Week 1:  PT Short Term Goal 1 (Week 1): STG = LTG due to ELOS  Skilled Therapeutic Interventions/Progress Updates:    Pt received sitting on toilet with NT present and pt agreeable to therapy session. Sitting on toilet pt able to change brief, don/doff LB clothing, don socks and shoes all with set-up assist and supervision for balance safety but no LOB occurs - pt does compensate using her UEs to assist bringing R LE into figure 4 position. Sit>stand toilet>RW with CGA for steadying, standing with light min assist for balance, pt able to pull LB clothing up over hips with min assist. Gait training out to room using RW with CGA/min assist with cuing for proper AD management at sink. Standing hand hygiene CGA.  Transported to/from gym in w/c for time management and energy conservation.   ACE wrapped R knee per pt request to provide increased proprioceptive feedback to improve quad control during stance as pt slightly buckles.  Sit<>stands using L UE support on narrow based quad cane (NBQC) with CGA during session.  Gait training 15ft + 110ft using NBQC with min assist for balance due to postural instability when pt feeling unstable on her R LE. Pt demonstrating the following gait deviations with therapist providing the described cuing and facilitation for improvement:  - has some R knee instability initially upon starting to walk and then at end when becoming fatigued - this causes slight anterior lean/postural instability - introduced turning through doorways on 2nd walk with pt having greater balance instability with this requiring heavier min assist and pt having some fear of falling when she is unstable causing her to want to reach out and grab hallway rail,  therapist provides encouragement and reassurance to increase confidence to walk without the rail - reciprocal stepping pattern although shorter steps with decreased R LE stance time  Pt eager to attempt gait with no UE support so performed ~52ft gait training from w/c>stairs without UE support with no buckling in R LE and reciprocal stepping pattern - light min assist for balance/safety.  Stair navigation training ascending/descending 4 steps (6" height) using B HRs with heavy mod assist on ascent as therapist cuing pt to attempt reciprocal pattern for R LE NMR and then pt becoming fearful when trying to power up through R LE, requiring therapist to provide her support to prevent squatting down/buckling and providing encouragement/reinforcement for her to come to standing and she was able to recover and continue with forward step-to  pattern to get to top then performed step-to leading with R LE on descent with min assist.  Pt upset she was not as successful with stairs today compared to yesterday, reporting she did them well yesterday with reciprocal pattern; however, per chart used step-to pattern.  After seated rest break, navigated 8 steps using step-to pattern leading with R LE on ascent to challenge R LE NMR for hip/knee extension strengthening with heavy min/light mod assist on ascent and significant words of encouragement and tactile cuing to increased quad activation then step-to pattern leading R LE on descent.with light min/CGA.   Pt requesting to perform B UE w/c propulsion ~69ft to mat with supervision with increased time/effort to problem solve and motor plan how to move w/c correct direction.  R LE and  R UE NMR and strengthening task via performing sit<>stands to/from slightly elevated EOM with L LE on 2" step to promote increased R LE WBing while reaching forward to grab clothespin and then standing tall to place it on basketball goal netting using R hand - requires min assist with 1x mod  assist to ensure her hips landed safely on mat due to R knee buckling as pt went to sit down, therapist guarding pt's R knee throughout for safety.  Performed seated R UE NMR task of threading beads on a string with pt demoing excellent ability to do this using her R hand as the dominant hand.   Stand pivots to/from w/c using B UE support on therapist with heavy min assist and cuing for pt to maintain upright posture and step feet to turn fully due to tendency for her to want to squat down and reach hand towards armrest of seat and initiate sitting before turned completely.   Gait training additional ~68ft using L UE support on NBQC with more consistent min assist at this time due to pt having more R knee instability due to fatigue - +2 w/c follow for safety at this time to allow pt to start to walk towards her room.   Doffed ACE wrap.  At end of session, pt left seated in recliner with needs in reach and seat belt alarm on.  Therapy Documentation Precautions:  Precautions Precautions: Fall Precaution Comments: HIV; R hemi Restrictions Weight Bearing Restrictions: No   Pain: Denies pain during session.   Therapy/Group: Individual Therapy  Ginny Forth , PT, DPT, NCS, CSRS 04/18/2023, 8:01 AM

## 2023-04-18 NOTE — Progress Notes (Signed)
Occupational Therapy Session Note  Patient Details  Name: Latoya Roth MRN: [id number] Date of Birth: [birth year]  Today's Date: 04/18/2023 OT Individual Time: 1350-1430 OT Individual Time Calculation (min): 40 min    Short Term Goals: Week 1:  OT Short Term Goal 1 (Week 1): STG=LTG  Skilled Therapeutic Interventions/Progress Updates:    Patient agreeable to participate in OT session. Reports 0/10 pain level.   Patient participated in skilled OT session focusing on RUE NM re-ed and functional transfers in order to improve functional performance and use of RUE during self care tasks such as opening container or jars.  Pt completed functional transfer from recliner <> w/c with Min A and no device. Step transfer completed from w/c to blue standard chair and no arm rests with Min guard and no device.  Pt was educated on grip and pinch strengthening activities utilizing resistive clothespin and handgripper Hand strengthening: - Green resistive clothespin utilized in right hand with lateral pinch to pick up 20 small foam shapes and place on elevated surface. Min-mod difficulty to complete. Increased time needed.  - Handgripper set to 35.3lb with coban wrap around handles for better grip, pt was able to pick up jenga block focusing on sustained grip strength in order to place blocks in tall red cup. Max difficulty to complete with increased time needed.  - Utilized green resistive clothespin to pick up and stack foam shapes in a tower of two. Completed a total of 3 towers with increased time and moderate difficulty.  VC provided for form and technique as needed.     Therapy Documentation Precautions:  Precautions Precautions: Fall Precaution Comments: HIV; R hemi Restrictions Weight Bearing Restrictions: No   Therapy/Group: Individual Therapy  Limmie Patricia, OTR/L,CBIS  Supplemental OT - [facility] and [facility] Secure Chat Preferred   04/18/2023, 7:03 AM

## 2023-04-19 DIAGNOSIS — I6389 Other cerebral infarction: Secondary | ICD-10-CM | POA: Diagnosis not present

## 2023-04-19 LAB — GLUCOSE, CAPILLARY
Glucose-Capillary: 204 mg/dL — ABNORMAL HIGH (ref 70–99)
Glucose-Capillary: 170 mg/dL — ABNORMAL HIGH (ref 70–99)
Glucose-Capillary: 238 mg/dL — ABNORMAL HIGH (ref 70–99)
Glucose-Capillary: 204 mg/dL — ABNORMAL HIGH (ref 70–99)

## 2023-04-19 MED ORDER — MENTHOL 3 MG MT LOZG
1.0000 | LOZENGE | OROMUCOSAL | Status: DC | PRN
  Administered 2023-04-19 – 2023-04-24 (×3): 3 mg via ORAL
  Filled 2023-04-19 (×2): qty 9

## 2023-04-19 MED ORDER — IPRATROPIUM-ALBUTEROL 0.5-2.5 (3) MG/3ML IN SOLN
3.0000 mL | Freq: Two times a day (BID) | RESPIRATORY_TRACT | Status: DC
  Administered 2023-04-19 – 2023-04-21 (×5): 3 mL via RESPIRATORY_TRACT
  Filled 2023-04-19 (×6): qty 3

## 2023-04-19 MED ORDER — GLUCERNA SHAKE PO LIQD
237.0000 mL | Freq: Every day | ORAL | Status: DC
  Administered 2023-04-20 – 2023-04-24 (×5): 237 mL via ORAL

## 2023-04-19 NOTE — Progress Notes (Signed)
Came to room for morning inhaler, pt is currently out of the room.

## 2023-04-19 NOTE — IPOC Note (Signed)
Overall Plan of Care [facility]) Patient Details Name: Latoya Roth MRN: [id number] DOB: 10-21-1971  Admitting Diagnosis: Acute brainstem infarction [facility])  Hospital Problems: Principal Problem:   Acute brainstem infarction [facility])     Functional Problem List: Nursing Endurance, Safety, Medication Management  PT Balance, Endurance, Safety  OT Balance, Safety, Endurance, Motor  SLP    TR         Basic ADL's: OT Eating, Grooming, Bathing, Dressing, Toileting     Advanced  ADL's: OT Simple Meal Preparation, Light Housekeeping     Transfers: PT Bed Mobility, Car, Bed to Chair  OT Toilet, Tub/Shower     Locomotion: PT Ambulation, Stairs     Additional Impairments: OT Fuctional Use of Upper Extremity  SLP        TR      Anticipated Outcomes Item Anticipated Outcome  Self Feeding Mod I  Swallowing      Basic self-care  Mod I  Toileting  Mod I   Bathroom Transfers Mod I  Bowel/Bladder  n/a  Transfers  mod I  Locomotion  supervision  Communication     Cognition     Pain  n/a  Safety/Judgment  manage w cues   Therapy Plan: PT Intensity: Minimum of 1-2 x/day ,45 to 90 minutes PT Frequency: 5 out of 7 days PT Duration Estimated Length of Stay: 7-9 days OT Intensity: Minimum of 1-2 x/day, 45 to 90 minutes OT Frequency: 5 out of 7 days OT Duration/Estimated Length of Stay: 7-9 days     Team Interventions: Nursing Interventions Patient/Family Education, Disease Management/Prevention, Medication Management, Discharge Planning  PT interventions Ambulation/gait training, Warden/ranger, Community reintegration, Discharge planning, Disease management/prevention, DME/adaptive equipment instruction, Functional electrical stimulation, Functional mobility training, Neuromuscular re-education, Pain management, Patient/family education, Psychosocial support, Splinting/orthotics, Stair training, Therapeutic Activities, Therapeutic Exercise, UE/LE  Strength taining/ROM, UE/LE Coordination activities, Visual/perceptual remediation/compensation, Wheelchair propulsion/positioning  OT Interventions Warden/ranger, Discharge planning, Functional electrical stimulation, Self Care/advanced ADL retraining, Therapeutic Activities, UE/LE Coordination activities, Cognitive remediation/compensation, Functional mobility training, Patient/family education, Therapeutic Exercise, UE/LE Strength taining/ROM, Splinting/orthotics, Neuromuscular re-education, DME/adaptive equipment instruction  SLP Interventions    TR Interventions    SW/CM Interventions Discharge Planning, Psychosocial Support, Patient/Family Education, Disease Management/Prevention   Barriers to Discharge MD  Medical stability  Nursing Lack of/limited family support 1 level apt solo; god sister to assist prn  PT Decreased caregiver support, Weight, Insurance for SNF coverage    OT      SLP      SW Inaccessible home environment, Decreased caregiver support, Lack of/limited family support, Community education officer for SNF coverage     Team Discharge Planning: Destination: PT-Home ,OT- Home , SLP-  Projected Follow-up: PT-Home health PT, OT-  Outpatient OT, SLP-  Projected Equipment Needs: PT-To be determined, OT- To be determined, SLP-  Equipment Details: PT- , OT-Has a cane - has no bathroom equipment Patient/family involved in discharge planning: PT- Patient,  OT-Patient, SLP-   MD ELOS: 10 days Medical Rehab Prognosis:  Excellent Assessment: The patient has been admitted for CIR therapies with the diagnosis of acute brainstem infarction. The team will be addressing functional mobility, strength, stamina, balance, safety, adaptive techniques and equipment, self-care, bowel and bladder mgt, patient and caregiver education. Goals have been set at modI/S. Anticipated discharge destination is home.         See Team Conference Notes for weekly updates to the plan of care

## 2023-04-19 NOTE — Progress Notes (Signed)
Physical Therapy Session Note  Patient Details  Name: Latoya Roth MRN: [id number] Date of Birth: 09/11/1971  Today's Date: 04/19/2023 PT Individual Time: 0340-0440 PT Individual Time Calculation (min): 60 min   Short Term Goals: Week 1:  PT Short Term Goal 1 (Week 1): STG = LTG due to ELOS  Skilled Therapeutic Interventions/Progress Updates:     Pt greeted in room in recliner chair, with lap alarm belt on. No pain. Pt agreeable to therapy.  Gait training: Pt asked to use RW this evening due to fatigue and personal preference. GT x 101 ft with RW with PT CGA and verbal cues to decrease cadence and education on all phases of gait with emphasis on stance phase R LE due to R LE looking unsteady.   Therex: All STS transfers from recliner chair, edge of bed and wheelchair CGA. Bil LAQ x 20, unilat seated march x 20, standing shoulder flexion/extension x 20; standing forward alternating punches x 20; standing forward reaches to tap onto table with concentration on even reach x 20; standing pink ball shoulder press fast and slow x 20 each. Supine pink weighted knee ball squeezesx 20; bridge with pink weighted ball x 20 with glute set, SLR x 15 with focus on quality of movement and maintaining quad set R LE and x 15 L; SLR/hip abduction/adduction combo x 15 each; sidelying clam shell R x 20; sidelying hip abduction R x 20. At end of abduction, pt began having a coughing fit. She transferred from supine to sit with Supervision and into wheelchair with CGA due to wanting water. PT got her water which did not help the cough. Nursing notified; pt asked for cough drop and nursing to put in an order. PT pushed patient back to her room for energy efficiency and she transferred backto her recliner chair with CGA. Her coughing fit stopped. Seated L clam shell performed in chair x 20.  Pt left in room in recliner chair with lap belt alarm in place, cell phone in hand, no pain.   Therapy  Documentation Precautions:  Precautions Precautions: Fall Precaution Comments: HIV; R hemi Restrictions Weight Bearing Restrictions: Yes     Therapy/Group: Individual Therapy  Luna Fuse 04/19/2023, 5:08 PM

## 2023-04-19 NOTE — Progress Notes (Signed)
Orthopedic Tech Progress Note Patient Details:  Latoya Roth [birth year] [id number]  Ortho Devices Type of Ortho Device: Knee Sleeve Ortho Device/Splint Location: BLE Ortho Device/Splint Interventions: Ordered, Application   Post Interventions Patient Tolerated: Well Instructions Provided: Care of device, Adjustment of device Patient requested to not have knee sleeves on at this moment, knee sleeves left at bedside and instructions provided on how to apply knee sleeves. [name] 04/19/2023, 2:07 PM

## 2023-04-19 NOTE — Progress Notes (Signed)
   04/19/23 1600  Spiritual Encounters  Type of Visit Initial  Care provided to: Patient  Referral source IDT Rounds  Reason for visit Routine spiritual support  OnCall Visit No   While rounding on the floor, pt requested sacred text. Ch provided sacred text and words of encouragement from scripture. No follow-up needed at this time.

## 2023-04-19 NOTE — Progress Notes (Signed)
PROGRESS NOTE   Subjective/Complaints: Has headache this morning, asking nursing to bring tylenol for her CBG up to 360, will decrease Glucerna to daily  ROS: +situational depression, +shortness of breath, +headache  Objective:   No results found. Recent Labs    04/17/23 0552  WBC 6.4  HGB 12.8  HCT 39.0  PLT 356   Recent Labs    04/17/23 0552  NA 131*  K 4.0  CL 93*  CO2 27  GLUCOSE 259*  BUN 11  CREATININE 0.71  CALCIUM 9.2    Intake/Output Summary (Last 24 hours) at 04/19/2023 1016 Last data filed at 04/19/2023 0743 Gross per 24 hour  Intake 717 ml  Output --  Net 717 ml        Physical Exam: Vital Signs Blood pressure (!) 120/55, pulse 71, temperature 97.7 F (36.5 C), temperature source Oral, resp. rate 18, height 5\' 7"  (1.702 m), weight 113.7 kg, SpO2 97 %. Constitutional:      General: She is not in acute distress.    Appearance: She is obese. BMI 39.26    Comments: Pt awake, alert, appropriate, stopped coughing after drinking water a few times;  ate 100% tray; needs to void; asked for NT, sitting up in bedside chair, NAD  HENT:     Head: Normocephalic and atraumatic.     Comments: Smile equal, but R facial droop- correct more with smile Tongue midline R face numb/decreased sensation to light touch    Right Ear: External ear normal.     Left Ear: External ear normal.     Nose: Congestion and rhinorrhea present.     Mouth/Throat:     Mouth: Mucous membranes are moist.     Pharynx: Oropharynx is clear. No oropharyngeal exudate.  Eyes:     General:        Right eye: No discharge.        Left eye: No discharge.     Extraocular Movements: Extraocular movements intact.     Comments:  Mild nystagmus B/L  Cardiovascular:     Rate and Rhythm: Normal rate and regular rhythm.     Heart sounds: Normal heart sounds. No murmur heard.    No gallop.  Pulmonary:     Effort: Pulmonary effort is  normal. No respiratory distress.     Breath sounds: Normal breath sounds. No wheezing, rhonchi or rales.     Comments: Once stopped coughing, sounded clear to auscultation B/L.  Abdominal:     General: Bowel sounds are normal. There is no distension.     Palpations: Abdomen is soft.     Tenderness: There is no abdominal tenderness.  Musculoskeletal:     Cervical back: Neck supple. No tenderness.     Comments: LUE 5/5 in same muscles RUE- 4/5 proximal; grip 3+/5; FA 2/5 LLE- 5/5 in same muscles RLE- HF 4/5; KE/KF 4-/5; DF/PF 4-/5  Crepitus in right knee Skin:    General: Skin is warm and dry.     Comments: IV L AC fossa- looks OK Trace LE edema to ankles B/L  Neurological:     Mental Status: She is alert.     Comments: Decreased  to light touch on R side arm and leg No clonus, but has brisk Hoffman's RUE Difficulty with slurring words Naming intact and repetition intact- just hard to understand  Ambulating with NBQC with minA  Psychiatric:        Thought Content: Thought content normal.        Judgment: Judgment normal.     Comments: Tearful frequently during interview    Assessment/Plan: 1. Functional deficits which require 3+ hours per day of interdisciplinary therapy in a comprehensive inpatient rehab setting. Physiatrist is providing close team supervision and 24 hour management of active medical problems listed below. Physiatrist and rehab team continue to assess barriers to discharge/monitor patient progress toward functional and medical goals  Care Tool:  Bathing    Body parts bathed by patient: Right arm, Chest, Abdomen, Right upper leg, Left upper leg, Face   Body parts bathed by helper: Front perineal area, Buttocks, Left lower leg, Right lower leg     Bathing assist Assist Level: Moderate Assistance - Patient 50 - 74%     Upper Body Dressing/Undressing Upper body dressing   What is the patient wearing?: Pull over shirt    Upper body assist Assist Level:  Minimal Assistance - Patient > 75%    Lower Body Dressing/Undressing Lower body dressing      What is the patient wearing?: Pants, Underwear/pull up     Lower body assist Assist for lower body dressing: Moderate Assistance - Patient 50 - 74%     Toileting Toileting    Toileting assist Assist for toileting: Moderate Assistance - Patient 50 - 74%     Transfers Chair/bed transfer  Transfers assist     Chair/bed transfer assist level: Minimal Assistance - Patient > 75%     Locomotion Ambulation   Ambulation assist      Assist level: Minimal Assistance - Patient > 75% Assistive device: Cane-quad Max distance: 33ft   Walk 10 feet activity   Assist     Assist level: Minimal Assistance - Patient > 75% Assistive device: Cane-quad   Walk 50 feet activity   Assist    Assist level: Minimal Assistance - Patient > 75% Assistive device: Cane-quad    Walk 150 feet activity   Assist Walk 150 feet activity did not occur: Safety/medical concerns         Walk 10 feet on uneven surface  activity   Assist Walk 10 feet on uneven surfaces activity did not occur: Safety/medical concerns         Wheelchair     Assist Is the patient using a wheelchair?: No             Wheelchair 50 feet with 2 turns activity    Assist            Wheelchair 150 feet activity     Assist          Blood pressure (!) 120/55, pulse 71, temperature 97.7 F (36.5 C), temperature source Oral, resp. rate 18, height 5\' 7"  (1.702 m), weight 113.7 kg, SpO2 97 %.  Medical Problem List and Plan: 1. 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  Antithrombotics: -DVT/anticoagulation:  Pharmaceutical: Lovenox             -antiplatelet therapy:  Aspirin and Plavix for three weeks followed by aspirin alone   3. Pain Management: Tylenol as needed    4. Mood/Behavior/Sleep: LCSW to evaluate and provide emotional support             -Depression>The current medical regimen is effective;  continue present plan and medications. Lexapro was old med- since didn't stay on it, not sure if wasn't helpful- will try Celexa 20 mg daily             -antipsychotic agents: n/a   5. Neuropsych/cognition: This patient is capable of making decisions on her own behalf.   6. Skin/Wound Care: Routine skin care checks   7. Fluids/Electrolytes/Nutrition: Routine Is and Os and follow-up chemistries             -carb modified diet   8: Hypertension: monitor TID and prn (home Lasix not restarted)             -continue losartan 50 mg daily   Supplement 2grams IV magnesium on 4/25 9: Hyperlipidemia: continue statin   10: HIV: continue Biktarvy   11: DM-2: A1c 8.0%; CBGs four times daily; (home meds include Lantus Solostar, Glucotrol XL and Ozempic)             -continue SSI             -continue Semglee 12 units BID             -continue meal coverage 5 units TID   --placed dietary order for no sugar snack tray  -glucerna decreased to BID 12: Moderate persistent asthma: continue Dulera. Nebulizer scheduled BID   13: Allergic rhinitis: continue Claritin             -start Flonase   14: Hyponatremia: discussed low Na, monitor weekly   15: Stress urinary incontinence: placed nursing order to take patient to bathroom q2H during the day to minimize incontinence  16. Hypoalbuminemia: Glucerna ordered.  17. Bilateral knee instability: knee sleeves ordered and discussed that they can provide support and stability  >50 minutes spent in discussion of her knee buckling with therapy, crepitus in her right knee, ordering knee sleeves for support and stability, reviewing her CBGs which were elevated to high up 360, decreasing glucerna to daily as these have added sugar, scheduling nebulizer as she has not asked for this but wants it and takes it twice daily at home  for her asthma     LOS: 3 days A FACE TO FACE EVALUATION WAS PERFORMED  Clint Bolder P Oneisha Ammons 04/19/2023, 10:16 AM

## 2023-04-19 NOTE — Progress Notes (Signed)
Occupational Therapy Session Note  Patient Details  Name: Latoya Roth MRN: [id number] Date of Birth: [birth year]  Today's Date: 04/19/2023 OT Individual Time: 1000-1045 OT Individual Time Calculation (min): 45 min    Short Term Goals: Week 1:  OT Short Term Goal 1 (Week 1): STG=LTG  Skilled Therapeutic Interventions/Progress Updates:    Patient received seated in recliner - asking if she can have OT prior to PT so she can shower regularly.  (Note sent to scheduler.) Patient agreeable to OT session and assisted to wheelchair without device and min assist with facilitation for postural control and weight shift.  Patient exaggerates lateral weight shift especially when stepping with left foot (weight bearing on right foot.)   Transported to gym - working on reach, grasp, release, and in-hand manipulation skills in sitting then standing.  Working toward more sustained activation of right shoulder - for humeral flexion with scapular depression.  Patient with optimal results with guided movement due to proximal weakness.  Improving range of motion.  Worked on stabilizing ball on wall.  Also catching ball (sitting/ standing)   Patient please with her progress and reports working on handwriting, although still experiencing difficulty self feeding with utensils.   Will continue to address.   Returned to room and recliner, and chair pad alarm engaged, personal items/ call bell in reach.    Therapy Documentation Precautions:  Precautions Precautions: Fall Precaution Comments: HIV; R hemi Restrictions Weight Bearing Restrictions: Yes  Pain:  Denies pain      Therapy/Group: Individual Therapy  [name] 04/19/2023, 12:14 PM

## 2023-04-19 NOTE — Progress Notes (Signed)
Physical Therapy Session Note  Patient Details  Name: Latoya Roth MRN: [id number] Date of Birth: 07/07/1971  Today's Date: 04/19/2023 PT Individual Time: 4696-2952 PT Individual Time Calculation (min): 77 min   Short Term Goals: Week 1:  PT Short Term Goal 1 (Week 1): STG = LTG due to ELOS  Skilled Therapeutic Interventions/Progress Updates:  Patient seated in recliner on entrance to room. Patient alert and agreeable to PT session.   Patient with no pain complaint at start of session. Does relate expected knee pain during mobility. No stability wrap applied to R knee this session in order to assess and challenge Bil knees. MD present during session and puts order in for Bil knee sleeves per pt request. Informs MD re: HA pain and RN arrives shortly with tylenol for pain relief.   Therapeutic Activity: While seated in recliner, pt relates that she wants to do everything by herself and is able to reach far distance for shoes, don and tie with supervision.  Transfers: Pt performed sit<>stand and stand pivot transfers throughout session with CGA/ MinA for Bil knee pain and mild "give" of R knee with pain. Provided verbal cues for safe descent to sit with improved eccentric control. Crepitus noted in each knee with mobility.   Gait Training:  Pt ambulated ~100' using RW per pt request as she states feeling "steadier with it". Completes with CGA and focused attn to R knee hold in soft extension during stance phase.   Pt ambulated ~5 ft to/ from steps with no AD with CGA. - Performs 4 bouts of steps starting with ascending steps with alternting steps per pt preference. Then step-to leading with RLE to descend. Then LLE ascending for safe stair training and completing with, RLE leading for NMR motor control/ strengthening. Completes all with CGA/ supervision with guard to Rknee.   Wheelchair Mobility:  Pt propelled wheelchair in therapy gym around turns and to position next to mat table. All  with supervision and extra time to complete. Provided vc for technique.  Neuromuscular Re-ed: NMR facilitated during session with focus on motor control. Pt guided in biased sit<>stands with 4" step under LLE and requires vc with visual demonstration for proper anterior lean for weight shift and forward push with BUE for LE to extend into upright stance. Improves to rise to stand with close supervision with guard to R knee. D/t crepitus inL knee, step removed from under L foot and pt guided in sit<>stands with biased lean to R side improving from CGA/ MiNA overall to supervision.   Progressed to dynamic stepping toward mirror to remove squiqz from overhead height to knee height using one dynamic step forward to reach. Guard to R knee when stepping forward with LLE and no buckling noted with use of RLE to step forward.  No LOB and maintains balance throughout stepping. One seated rest break for therapeutic rest.  NMR performed for improvements in motor control and coordination, balance, sequencing, judgement, and self confidence/ efficacy in performing all aspects of mobility at highest level of independence.   Patient seated upright in recliner at end of session with brakes locked, belt alarm set, and all needs within reach.    Therapy Documentation Precautions:  Precautions Precautions: Fall Precaution Comments: HIV; R hemi Restrictions Weight Bearing Restrictions: Yes General:   Vital Signs:  Pain:    Therapy/Group: Individual Therapy  Loel Dubonnet PT, DPT, CSRS 04/19/2023, 6:01 PM

## 2023-04-19 NOTE — Progress Notes (Signed)
Occupational Therapy Session Note  Patient Details  Name: Latoya Roth MRN: [id number] Date of Birth: 1971-05-29  Today's Date: 04/19/2023 OT Individual Time: 1300-1340 OT Individual Time Calculation (min): 40 min    Short Term Goals: Week 1:  OT Short Term Goal 1 (Week 1): STG=LTG  Skilled Therapeutic Interventions/Progress Updates:    Pt greeted semi-reclined in recliner and agreeable to OT treatment session. Pt declined need to go to the bathroom. Pt ambulated to therapy gym with RW and min A. OT placed beads in theraputty to work on R hand fine motor coordination. Standing balance/endurance along with UB there-ex using Tidal tank. 3 sets of 10 standing reps of chest press with tidal tank.  Seated rest break in between sets. Pt ambulated back to room in similar fashion and left seated in reclliner with alarm belt on, call bell in reach.   Therapy Documentation Precautions:  Precautions Precautions: Fall Precaution Comments: HIV; R hemi Restrictions Weight Bearing Restrictions: Yes Pain:  Denies pain  Therapy/Group: Individual Therapy  Mal Amabile 04/19/2023, 1:16 PM

## 2023-04-20 DIAGNOSIS — I6389 Other cerebral infarction: Secondary | ICD-10-CM | POA: Diagnosis not present

## 2023-04-20 LAB — GLUCOSE, CAPILLARY
Glucose-Capillary: 190 mg/dL — ABNORMAL HIGH (ref 70–99)
Glucose-Capillary: 230 mg/dL — ABNORMAL HIGH (ref 70–99)
Glucose-Capillary: 279 mg/dL — ABNORMAL HIGH (ref 70–99)
Glucose-Capillary: 312 mg/dL — ABNORMAL HIGH (ref 70–99)

## 2023-04-20 MED ORDER — INSULIN GLARGINE-YFGN 100 UNIT/ML ~~LOC~~ SOLN
14.0000 [IU] | Freq: Two times a day (BID) | SUBCUTANEOUS | Status: DC
  Administered 2023-04-20 – 2023-04-24 (×8): 14 [IU] via SUBCUTANEOUS
  Filled 2023-04-20 (×10): qty 0.14

## 2023-04-20 MED ORDER — ALBUTEROL SULFATE (2.5 MG/3ML) 0.083% IN NEBU
2.5000 mg | INHALATION_SOLUTION | RESPIRATORY_TRACT | Status: DC | PRN
  Administered 2023-04-20: 2.5 mg via RESPIRATORY_TRACT
  Filled 2023-04-20: qty 3

## 2023-04-20 NOTE — Progress Notes (Signed)
CBGs have been consistently elevated. Ok to titrate semglee to 14 units BID per Dr. Berline Chough.  Ulyses Southward, PharmD, BCIDP, AAHIVP, CPP Infectious Disease Pharmacist 04/20/2023 10:53 AM

## 2023-04-20 NOTE — Progress Notes (Signed)
Physical Therapy Session Note  Patient Details  Name: Latoya Roth MRN: [id number] Date of Birth: 01-13-1971  Today's Date: 04/20/2023 PT Individual Time: 4098-1191 + 4782-9562 PT Individual Time Calculation (min): 55 min + 40 min  Short Term Goals: Week 1:  PT Short Term Goal 1 (Week 1): STG = LTG due to ELOS  Skilled Therapeutic Interventions/Progress Updates:     Session 1: Chart reviewed and pt agreeable to therapy. Pt received seated in recliner for direct handoff from OT with no c/o pain. Also of note, OT stated pt would like to wash clothes as part of therapy activity, which was agreeable to PT session focus. Session focused on amb endurance and quality, dynamic standing balance for functional tasks in home, and curb stepping to promote safe home access. Pt initiated session with amb of 175ft to laundry using CGA + RW. Pt then completed laundry placement into washing machine and managed start of cycle for CGA for balance. Pt did not require VC for safety. Pt then amb 60 ft to therapy gym using same assist + AD. Pt then completed series of balance exercises including neutral stance, narrow stance, neutral +eyes closed, gazing over shoulder, reaching, and marches all with CGA + no AD except marching that required MinA. PT and pt then discussed preparedness to progress to rollator in next session. Pt then completed step up/down on curb with CGA + RW for repeated practice with VC for safety and sequencing. Pt then returned to room in [facility] 2/2 fatigue. From room, pt completed amb of 151ft with CGA + RW to return to recliner and demonstrating good safety judgement for endurance level. At end of session, pt was left seated in recliner with alarm engaged, nurse call bell and all needs in reach.     Session 2: Chart reviewed and pt agreeable to therapy. Pt received seated in recliner with no c/o pain. Session focused on amb endurance and dynamic standing during home activities to promote safe  home access and progression to rollator to promote return to baseline function. Pt initiated session with education on use of rollator, which pt was able to verbalize understanding. Rollator then found to have damaged break, so PT changed out rollator. Pt then amb 142ft to laundry room with CGA + rollator + good safety awareness with Min VC. Pt required CGA during laundry cycle change. Pt then amb 172ft + 124ft + 168ft + 15ft +13ft with seated rest breaks between and all using CGA + rollator. Pt periodically progressed to close supervision and also demonstrated good safety with rollator for rest breaks. At end of session, pt was left seated in recliner with alarm engaged, nurse call bell and all needs in reach.    Therapy Documentation Precautions:  Precautions Precautions: Fall Precaution Comments: HIV; R hemi Restrictions Weight Bearing Restrictions: No      Therapy/Group: Individual Therapy  Dionne Milo, PT, DPT 04/20/2023, 12:09 PM

## 2023-04-20 NOTE — Progress Notes (Signed)
Occupational Therapy Session Note  Patient Details  Name: Latoya Roth MRN: [id number] Date of Birth: 02-May-1971  Today's Date: 04/20/2023 OT Individual Time: 4098-1191 OT Individual Time Calculation (min): 43 min    Short Term Goals: Week 1:  OT Short Term Goal 1 (Week 1): STG=LTG  Skilled Therapeutic Interventions/Progress Updates:     Pt received sitting up in recliner presenting to be in good spirits and receptive to skilled OT session. Pt reporting 0/10 pain and motivated to participate in therapy session with a focus on IADL retraining. Pt requesting to retrieve personal clothing items from drier at beginning of session. Pt able to ambulate >150 ft down hallways to laundry room using rollator with CGA. Pt stood at drier and removed items CGA with min A required during single LOB. Pt transported clothing items back to room in book back placed on her rollator with CGA. Pt reporting fatigue following ambulation. Educated Pt on energy conservation strategies with emphasis on importance of listening to your body, taking rest breaks, and pacing activities with Pt receptive to education. Pt requested to fold clothes seated for energy conservation. Pt able to complete reciprocal BUE movements to fold clothes with supervision. Pt transported items to drawers and placed them into high/low drawers with CGA. Pt ambulated to therapy gym using rollator CGA with single standing rest break provided. Pt completed 2x1 minute of seated beach ball volley using 3# weighted bar to work on dynamic sitting balance, BUE reciprocal arm movements, and activity tolerance with rest breaks provided between trials. Following second trial, Pt presenting with coughing episode requesting to return to room, get water, and use inhaler. Pt ambulated back to room using rollator CGA. Notified Rn with RN entering room at end of session to provide inhaler. Pt left resting in recliner with call bell in reach, seat belt alarm on,  and all needs met.   Therapy Documentation Precautions:  Precautions Precautions: Fall Precaution Comments: HIV; R hemi Restrictions Weight Bearing Restrictions: No   Therapy/Group: Individual Therapy  Army Fossa 04/20/2023, 8:06 AM

## 2023-04-20 NOTE — Progress Notes (Signed)
Came to room for morning breathing treatment.  Currently pt is out of the room

## 2023-04-20 NOTE — Progress Notes (Signed)
Occupational Therapy Session Note  Patient Details  Name: Latoya Roth MRN: [id number] Date of Birth: [birth year]  Today's Date: 04/20/2023 OT Individual Time: 4098-1191 OT Individual Time Calculation (min): 44 min    Short Term Goals: Week 1:  OT Short Term Goal 1 (Week 1): STG=LTG  Skilled Therapeutic Interventions/Progress Updates:    Pt received in recliner with no pain requesting to shower  ADL: Pt completes ADL at overall CGA Level. Skilled interventions include: amb to/from bathroom with no AD and RUE around OT, cuing for turns/proximity to surface before sitting, placemnet of all BADL items on R for encouraged RUE use. Pt uses RUE for nearly all tasks except rinsing conditioner out of hair d/t fatigue. Pt dresses sit to stand from chair at sink with set up for UB and CGA for LB even to don compressive knee sleeves with BUE  Direct handoff to PT at end of session  Therapy Documentation Precautions:  Precautions Precautions: Fall Precaution Comments: HIV; R hemi Restrictions Weight Bearing Restrictions: No General:    Therapy/Group: Individual Therapy  Shon Hale 04/20/2023, 6:50 AM

## 2023-04-20 NOTE — Progress Notes (Signed)
PROGRESS NOTE   Subjective/Complaints:  Pt reports no pain Slept well Mood "a little better".  LBM yesterday  ROS:   Pt denies SOB, abd pain, CP, N/V/C/D, and vision changes Except for HPI  Objective:   No results found. No results for input(s): "WBC", "HGB", "HCT", "PLT" in the last 72 hours.  No results for input(s): "NA", "K", "CL", "CO2", "GLUCOSE", "BUN", "CREATININE", "CALCIUM" in the last 72 hours.   Intake/Output Summary (Last 24 hours) at 04/20/2023 1222 Last data filed at 04/20/2023 9604 Gross per 24 hour  Intake 698 ml  Output --  Net 698 ml        Physical Exam: Vital Signs Blood pressure (!) 131/53, pulse 72, temperature 98.4 F (36.9 C), temperature source Oral, resp. rate 18, height 5\' 7"  (1.702 m), weight 113.7 kg, SpO2 93 %.     General: awake, alert, appropriate, sitting up in bedside chair in room; then seen in hallway with therapy; NAD HENT: conjugate gaze; oropharynx moist- R facial droop still evident CV: regular rate and rhythm; no JVD Pulmonary: CTA B/L; no W/R/R- good air movement GI: soft, NT, ND, (+)BS- protuberant Psychiatric: appropriate Neurological: Ox3  Musculoskeletal:     Cervical back: Neck supple. No tenderness.     Comments: LUE 5/5 in same muscles RUE- 4/5 proximal; grip 3+/5; FA 2/5 LLE- 5/5 in same muscles RLE- HF 4/5; KE/KF 4-/5; DF/PF 4-/5  Crepitus in right knee Skin:    General: Skin is warm and dry.     Comments: IV L AC fossa- looks OK Trace LE edema to ankles B/L  Neurological:     Mental Status: She is alert.     Comments: Decreased to light touch on R side arm and leg No clonus, but has brisk Hoffman's RUE Difficulty with slurring words Naming intact and repetition intact- just hard to understand  Ambulating with NBQC with minA  Psychiatric:        Thought Content: Thought content normal.        Judgment: Judgment normal.     Comments: Tearful  frequently during interview    Assessment/Plan: 1. Functional deficits which require 3+ hours per day of interdisciplinary therapy in a comprehensive inpatient rehab setting. Physiatrist is providing close team supervision and 24 hour management of active medical problems listed below. Physiatrist and rehab team continue to assess barriers to discharge/monitor patient progress toward functional and medical goals  Care Tool:  Bathing    Body parts bathed by patient: Right arm, Chest, Abdomen, Right upper leg, Left upper leg, Face   Body parts bathed by helper: Front perineal area, Buttocks, Left lower leg, Right lower leg     Bathing assist Assist Level: Moderate Assistance - Patient 50 - 74%     Upper Body Dressing/Undressing Upper body dressing   What is the patient wearing?: Pull over shirt    Upper body assist Assist Level: Minimal Assistance - Patient > 75%    Lower Body Dressing/Undressing Lower body dressing      What is the patient wearing?: Pants, Underwear/pull up     Lower body assist Assist for lower body dressing: Moderate Assistance - Patient 50 -  74%     Toileting Toileting    Toileting assist Assist for toileting: Moderate Assistance - Patient 50 - 74%     Transfers Chair/bed transfer  Transfers assist     Chair/bed transfer assist level: Minimal Assistance - Patient > 75%     Locomotion Ambulation   Ambulation assist      Assist level: Minimal Assistance - Patient > 75% Assistive device: Cane-quad Max distance: 12ft   Walk 10 feet activity   Assist     Assist level: Minimal Assistance - Patient > 75% Assistive device: Cane-quad   Walk 50 feet activity   Assist    Assist level: Minimal Assistance - Patient > 75% Assistive device: Cane-quad    Walk 150 feet activity   Assist Walk 150 feet activity did not occur: Safety/medical concerns         Walk 10 feet on uneven surface  activity   Assist Walk 10 feet on  uneven surfaces activity did not occur: Safety/medical concerns         Wheelchair     Assist Is the patient using a wheelchair?: No             Wheelchair 50 feet with 2 turns activity    Assist            Wheelchair 150 feet activity     Assist          Blood pressure (!) 131/53, pulse 72, temperature 98.4 F (36.9 C), temperature source Oral, resp. rate 18, height 5\' 7"  (1.702 m), weight 113.7 kg, SpO2 93 %.  Medical Problem List and Plan: 1. 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  Antithrombotics: -DVT/anticoagulation:  Pharmaceutical: Lovenox             -antiplatelet therapy: Aspirin and Plavix for three weeks followed by aspirin alone   3. Pain Management: Tylenol as needed   4. Mood/Behavior/Sleep: LCSW to evaluate and provide emotional support             -Depression>The current medical regimen is effective;  continue present plan and medications. Lexapro was old med- since didn't stay on it, not sure if wasn't helpful- will try Celexa 20 mg daily  4/27- pt feeling like mood is somewhat better- con't Celexa that's new             -antipsychotic agents: n/a   5. Neuropsych/cognition: This patient is capable of making decisions on her own behalf.   6. Skin/Wound Care: Routine skin care checks   7. Fluids/Electrolytes/Nutrition: Routine Is and Os and follow-up chemistries             -carb modified diet   8: Hypertension: monitor TID and prn (home Lasix not restarted)             -continue losartan 50 mg daily   Supplement 2grams IV magnesium on 4/25  4/27- BP's running 130s- con't regimen 9: Hyperlipidemia: continue statin   10: HIV: continue Biktarvy   11: DM-2: A1c 8.0%; CBGs four times daily; (home meds include Lantus Solostar, Glucotrol XL and Ozempic)             -continue SSI              -  continue Semglee 12 units BID             -continue meal coverage 5 units TID   --placed dietary order for no sugar snack tray  -glucerna decreased to daily  4/27- pt's Cbgs running 190s to low 200s- will increase Semglee to 14 units BID 12: Moderate persistent asthma: continue Dulera. Nebulizer scheduled BID   13: Allergic rhinitis: continue Claritin             -start Flonase   14: Hyponatremia: discussed low Na, monitor weekly  4/27- Last Na 131- will monitor for trend and if dips below 130, will intervene   15: Stress urinary incontinence: placed nursing order to take patient to bathroom q2H during the day to minimize incontinence  16. Hypoalbuminemia: Glucerna ordered.  17. Bilateral knee instability: knee sleeves ordered and discussed that they can provide support and stability   I spent a total of  41  minutes on total care today- >50% coordination of care- due to  D/w pharmacy about pt's CBGs- also d/w nursing about overall care and therapy to check on progress    LOS: 4 days A FACE TO FACE EVALUATION WAS PERFORMED  Arlander Gillen 04/20/2023, 12:22 PM

## 2023-04-21 DIAGNOSIS — I6389 Other cerebral infarction: Secondary | ICD-10-CM | POA: Diagnosis not present

## 2023-04-21 LAB — GLUCOSE, CAPILLARY
Glucose-Capillary: 196 mg/dL — ABNORMAL HIGH (ref 70–99)
Glucose-Capillary: 157 mg/dL — ABNORMAL HIGH (ref 70–99)
Glucose-Capillary: 244 mg/dL — ABNORMAL HIGH (ref 70–99)
Glucose-Capillary: 258 mg/dL — ABNORMAL HIGH (ref 70–99)

## 2023-04-21 NOTE — Progress Notes (Signed)
PROGRESS NOTE   Subjective/Complaints:  Pt doing well today, slept "good", denies pain, LBM yesterday, and urinating fine. Denies any other complaints or concerns today.   ROS:   Pt denies SOB, abd pain, CP, N/V/C/D, and vision changes Except for HPI  Objective:   No results found. No results for input(s): "WBC", "HGB", "HCT", "PLT" in the last 72 hours.  No results for input(s): "NA", "K", "CL", "CO2", "GLUCOSE", "BUN", "CREATININE", "CALCIUM" in the last 72 hours.   Intake/Output Summary (Last 24 hours) at 04/21/2023 1237 Last data filed at 04/21/2023 1228 Gross per 24 hour  Intake 720 ml  Output --  Net 720 ml        Physical Exam: Vital Signs Blood pressure 137/69, pulse 71, temperature 98.1 F (36.7 C), temperature source Oral, resp. rate 19, height 5\' 7"  (1.702 m), weight 113.7 kg, SpO2 95 %.     General: awake, alert, appropriate, sitting up in bedside chair in room; NAD HENT: conjugate gaze; oropharynx moist- R facial droop still evident CV: regular rate and rhythm; no JVD Pulmonary: CTA B/L; no W/R/R- good air movement GI: soft, NT, ND, (+)BS- protuberant/obese Psychiatric: appropriate Neurological: Ox3  PRIOR EXAMS: Musculoskeletal:     Cervical back: Neck supple. No tenderness.     Comments: LUE 5/5 in same muscles RUE- 4/5 proximal; grip 3+/5; FA 2/5 LLE- 5/5 in same muscles RLE- HF 4/5; KE/KF 4-/5; DF/PF 4-/5  Crepitus in right knee Skin:    General: Skin is warm and dry.     Comments: IV L AC fossa- looks OK Trace LE edema to ankles B/L  Neurological:     Mental Status: She is alert.     Comments: Decreased to light touch on R side arm and leg No clonus, but has brisk Hoffman's RUE Difficulty with slurring words Naming intact and repetition intact- just hard to understand  Ambulating with NBQC with minA  Psychiatric:        Thought Content: Thought content normal.        Judgment:  Judgment normal.     Comments: Tearful frequently during interview    Assessment/Plan: 1. Functional deficits which require 3+ hours per day of interdisciplinary therapy in a comprehensive inpatient rehab setting. Physiatrist is providing close team supervision and 24 hour management of active medical problems listed below. Physiatrist and rehab team continue to assess barriers to discharge/monitor patient progress toward functional and medical goals  Care Tool:  Bathing    Body parts bathed by patient: Right arm, Chest, Abdomen, Right upper leg, Left upper leg, Face   Body parts bathed by helper: Front perineal area, Buttocks, Left lower leg, Right lower leg     Bathing assist Assist Level: Moderate Assistance - Patient 50 - 74%     Upper Body Dressing/Undressing Upper body dressing   What is the patient wearing?: Pull over shirt    Upper body assist Assist Level: Minimal Assistance - Patient > 75%    Lower Body Dressing/Undressing Lower body dressing      What is the patient wearing?: Pants, Underwear/pull up     Lower body assist Assist for lower body dressing: Moderate Assistance -  Patient 50 - 74%     Toileting Toileting    Toileting assist Assist for toileting: Moderate Assistance - Patient 50 - 74%     Transfers Chair/bed transfer  Transfers assist     Chair/bed transfer assist level: Minimal Assistance - Patient > 75%     Locomotion Ambulation   Ambulation assist      Assist level: Minimal Assistance - Patient > 75% Assistive device: Cane-quad Max distance: 70ft   Walk 10 feet activity   Assist     Assist level: Minimal Assistance - Patient > 75% Assistive device: Cane-quad   Walk 50 feet activity   Assist    Assist level: Minimal Assistance - Patient > 75% Assistive device: Cane-quad    Walk 150 feet activity   Assist Walk 150 feet activity did not occur: Safety/medical concerns         Walk 10 feet on uneven surface   activity   Assist Walk 10 feet on uneven surfaces activity did not occur: Safety/medical concerns         Wheelchair     Assist Is the patient using a wheelchair?: No             Wheelchair 50 feet with 2 turns activity    Assist            Wheelchair 150 feet activity     Assist          Blood pressure 137/69, pulse 71, temperature 98.1 F (36.7 C), temperature source Oral, resp. rate 19, height 5\' 7"  (1.702 m), weight 113.7 kg, SpO2 95 %.  Medical Problem List and Plan: 1. 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  Antithrombotics: -DVT/anticoagulation:  Pharmaceutical: Lovenox 40mg  QD -antiplatelet therapy: Aspirin 81mg  QD and Plavix 75mg  QD for three weeks followed by aspirin alone   3. Pain Management: Tylenol and Robaxin as needed   4. Mood/Behavior/Sleep: LCSW to evaluate and provide emotional support -Depression>The current medical regimen is effective;  continue present plan and medications. Lexapro was old med- since didn't stay on it, not sure if wasn't helpful- will try Celexa 20 mg daily  -4/27- pt feeling like mood is somewhat better- con't Celexa that's new             -antipsychotic agents: n/a   5. Neuropsych/cognition: This patient is capable of making decisions on her own behalf.   6. Skin/Wound Care: Routine skin care checks   7. Fluids/Electrolytes/Nutrition: Routine Is and Os and follow-up chemistries             -carb modified diet  -04/21/23 d/c IV today, pt appreciative of this   8: Hypertension: monitor TID and prn (home Lasix not restarted)             -continue losartan 50 mg daily   -Supplement 2grams IV magnesium on 4/25  -04/21/23 BPs variable but mostly downtrending and controlled, monitor Vitals:   04/17/23 1927 04/18/23 0351 04/18/23 1349 04/18/23 1940  BP:  (!) 163/87 (!) 161/90 124/89 (!) 166/83   04/19/23 0524 04/19/23 1341 04/19/23 1940 04/19/23 2046  BP: (!) 120/55 (!) 147/81 (!) 178/95 (!) 150/73   04/20/23 0424 04/20/23 1319 04/20/23 1926 04/21/23 0423  BP: (!) 131/53 138/65 (!) 155/71 137/69    9: Hyperlipidemia: continue rosuvastatin 20mg  QD   10: HIV: continue Biktarvy 1 tab QD   11: DM-2: A1c 8.0%; CBGs four times daily; (home meds include Lantus Solostar, Glucotrol XL and Ozempic)             -continue SSI             -continue Semglee 12 units BID             -continue meal coverage 5 units TID   -placed dietary order for no sugar snack tray  -glucerna decreased to daily -4/27- pt's Cbgs running 190s to low 200s- will increase Semglee to 14 units BID -04/21/23 CBGs still high, but just changed semglee yesterday afternoon, monitor trend, may need further adjustments CBG (last 3)  Recent Labs    04/20/23 2011 04/21/23 0612 04/21/23 1146  GLUCAP 312* 196* 157*    12: Moderate persistent asthma: continue Dulera 2 puffs BID. Duoneb Nebulizer scheduled BID. Tessalon 100mg  BID.   13: Allergic rhinitis: continue Claritin 10mg  QD             -start Flonase 1 spray each nostril QD   14: Hyponatremia: discussed low Na, monitor weekly -4/27- Last Na 131- will monitor for trend and if dips below 130, will intervene, monitor routine labs   15: Stress urinary incontinence: placed nursing order to take patient to bathroom q2H during the day to minimize incontinence  16. Hypoalbuminemia: Glucerna ordered.  17. Bilateral knee instability: knee sleeves ordered and discussed that they can provide support and stability    LOS: 5 days A FACE TO FACE EVALUATION WAS PERFORMED  [address] 04/21/2023, 12:37 PM

## 2023-04-22 ENCOUNTER — Inpatient Hospital Stay (HOSPITAL_COMMUNITY): Payer: Medicaid Other

## 2023-04-22 DIAGNOSIS — I6389 Other cerebral infarction: Secondary | ICD-10-CM | POA: Diagnosis not present

## 2023-04-22 DIAGNOSIS — M7989 Other specified soft tissue disorders: Secondary | ICD-10-CM | POA: Diagnosis not present

## 2023-04-22 LAB — CBC
HCT: 37.3 % (ref 36.0–46.0)
Hemoglobin: 11.9 g/dL — ABNORMAL LOW (ref 12.0–15.0)
MCH: 26.6 pg (ref 26.0–34.0)
MCHC: 31.9 g/dL (ref 30.0–36.0)
MCV: 83.3 fL (ref 80.0–100.0)
Platelets: 357 10*3/uL (ref 150–400)
RBC: 4.48 MIL/uL (ref 3.87–5.11)
RDW: 13.2 % (ref 11.5–15.5)
WBC: 6.6 10*3/uL (ref 4.0–10.5)
nRBC: 0 % (ref 0.0–0.2)

## 2023-04-22 LAB — BASIC METABOLIC PANEL
Anion gap: 7 (ref 5–15)
BUN: 9 mg/dL (ref 6–20)
CO2: 28 mmol/L (ref 22–32)
Calcium: 9.1 mg/dL (ref 8.9–10.3)
Chloride: 101 mmol/L (ref 98–111)
Creatinine, Ser: 0.72 mg/dL (ref 0.44–1.00)
GFR, Estimated: >60 mL/min (ref >60)
Glucose, Bld: 161 mg/dL — ABNORMAL HIGH (ref 70–99)
Potassium: 3.9 mmol/L (ref 3.5–5.1)
Sodium: 136 mmol/L (ref 135–145)

## 2023-04-22 LAB — GLUCOSE, CAPILLARY
Glucose-Capillary: 168 mg/dL — ABNORMAL HIGH (ref 70–99)
Glucose-Capillary: 160 mg/dL — ABNORMAL HIGH (ref 70–99)
Glucose-Capillary: 193 mg/dL — ABNORMAL HIGH (ref 70–99)
Glucose-Capillary: 123 mg/dL — ABNORMAL HIGH (ref 70–99)

## 2023-04-22 MED ORDER — MAGNESIUM GLUCONATE 500 MG PO TABS
250.0000 mg | ORAL_TABLET | Freq: Every day | ORAL | Status: DC
  Administered 2023-04-22 – 2023-04-23 (×2): 250 mg via ORAL
  Filled 2023-04-22 (×2): qty 1

## 2023-04-22 NOTE — Progress Notes (Addendum)
Patient ID: Latoya Roth, female   DOB: Jun 04, 1971, 52 y.o.   MRN: [id number]  Covering for primary SW, Becky D.   Patient discharge moved up to 5/1.  Rollator ordered through [employer].   1:21 PM Sw informed patient will require a TTB and BSC, ordered through [employer].

## 2023-04-22 NOTE — Progress Notes (Signed)
PROGRESS NOTE   Subjective/Complaints: C/o left lower extremity swelling- vas [place] ordered and reviewed and is negative for clots, SCDs ordered to improve circulation, advised ice and elevating  ROS:  Pt denies SOB, abd pain, CP, N/V/C/D, and vision changes, +left lower extremity swelling  Objective:   VAS [place] LOWER EXTREMITY VENOUS (DVT)  Result Date: 04/22/2023  Lower Venous DVT Study Patient Name:  Latoya Roth  Date of Exam:   04/22/2023 Medical Rec #: [id number]            Accession #:    [id number] Date of Birth: 1971/01/03            Patient Gender: F Patient Age:   52 years Exam Location:  [facility] Procedure:      VAS [place] LOWER EXTREMITY VENOUS (DVT) Referring Phys: Sula Soda --------------------------------------------------------------------------------  Indications: Pain, and Swelling.  Limitations: Patient pain and body habitus. Comparison Study: Previous 02/13/22 negative. Performing Technologist: McKayla Maag RVT, [place]  Examination Guidelines: A complete evaluation includes B-mode imaging, spectral Doppler, color Doppler, and power Doppler as needed of all accessible portions of each vessel. Bilateral testing is considered an integral part of a complete examination. Limited examinations for reoccurring indications may be performed as noted. The reflux portion of the exam is performed with the patient in reverse Trendelenburg.  +-----+---------------+---------+-----------+----------+--------------+ RIGHTCompressibilityPhasicitySpontaneityPropertiesThrombus Aging +-----+---------------+---------+-----------+----------+--------------+ CFV  Full           Yes      Yes                                 +-----+---------------+---------+-----------+----------+--------------+ SFJ  Full                                                         +-----+---------------+---------+-----------+----------+--------------+   +--------+---------------+---------+-----------+----------+--------------------+ LEFT    CompressibilityPhasicitySpontaneityPropertiesThrombus Aging       +--------+---------------+---------+-----------+----------+--------------------+ CFV                    Yes      Yes                  Patent by color                                                           doppler              +--------+---------------+---------+-----------+----------+--------------------+ SFJ                                                  Patent by color  doppler              +--------+---------------+---------+-----------+----------+--------------------+ FV Prox                Yes      Yes                  Patent by color                                                           doppler              +--------+---------------+---------+-----------+----------+--------------------+ FV Mid  Full                                                              +--------+---------------+---------+-----------+----------+--------------------+ FV                     Yes      Yes                  Patent by color      Distal                                               doppler              +--------+---------------+---------+-----------+----------+--------------------+ PFV                    Yes      Yes                  Patent by color                                                           doppler              +--------+---------------+---------+-----------+----------+--------------------+ POP     Full           Yes      Yes                                       +--------+---------------+---------+-----------+----------+--------------------+ PTV     Full                                         Not well visualized   +--------+---------------+---------+-----------+----------+--------------------+ PERO    Full                                         Not well visualized  +--------+---------------+---------+-----------+----------+--------------------+  Summary: RIGHT: - No evidence of common femoral vein obstruction.  LEFT: - There is no evidence of deep vein thrombosis in the lower extremity. However, portions of this examination were limited- see technologist comments above.  *See table(s) above for measurements and observations.    Preliminary    Recent Labs    04/22/23 0554  WBC 6.6  HGB 11.9*  HCT 37.3  PLT 357    Recent Labs    04/22/23 0554  NA 136  K 3.9  CL 101  CO2 28  GLUCOSE 161*  BUN 9  CREATININE 0.72  CALCIUM 9.1     Intake/Output Summary (Last 24 hours) at 04/22/2023 1324 Last data filed at 04/22/2023 0754 Gross per 24 hour  Intake 960 ml  Output --  Net 960 ml        Physical Exam: Vital Signs Blood pressure (!) 127/50, pulse 64, temperature (!) 97.4 F (36.3 C), resp. rate 17, height 5\' 7"  (1.702 m), weight 113.7 kg, SpO2 97 %. Gen: no distress, normal appearing, BMI 39.26 HEENT: oral mucosa pink and moist, NCAT Cardio: Reg rate Chest: normal effort, normal rate of breathing Abd: soft, non-distended Ext: +LLE edema Psych: pleasant, normal affect Skin: intact  PRIOR EXAMS: Musculoskeletal:     Cervical back: Neck supple. No tenderness.     Comments: LUE 5/5 in same muscles RUE- 4/5 proximal; grip 3+/5; FA 2/5 LLE- 5/5 in same muscles RLE- HF 4/5; KE/KF 4-/5; DF/PF 4-/5  Crepitus in right knee Skin:    General: Skin is warm and dry.     Comments: IV L AC fossa- looks OK Trace LE edema to ankles B/L  Neurological:     Mental Status: She is alert.     Comments: Decreased to light touch on R side arm and leg No clonus, but has brisk Hoffman's RUE Difficulty with slurring words Naming intact and repetition intact- just hard to understand  Ambulating  with NBQC with minA  Psychiatric:        Thought Content: Thought content normal.        Judgment: Judgment normal.     Comments: Tearful frequently during interview    Assessment/Plan: 1. Functional deficits which require 3+ hours per day of interdisciplinary therapy in a comprehensive inpatient rehab setting. Physiatrist is providing close team supervision and 24 hour management of active medical problems listed below. Physiatrist and rehab team continue to assess barriers to discharge/monitor patient progress toward functional and medical goals  Care Tool:  Bathing    Body parts bathed by patient: Right arm, Chest, Abdomen, Right upper leg, Left upper leg, Face, Right lower leg, Left lower leg, Left arm, Front perineal area, Buttocks   Body parts bathed by helper: Front perineal area, Buttocks, Left lower leg, Right lower leg     Bathing assist Assist Level: Supervision/Verbal cueing     Upper Body Dressing/Undressing Upper body dressing   What is the patient wearing?: Bra, Pull over shirt    Upper body assist Assist Level: Set up assist    Lower Body Dressing/Undressing Lower body dressing      What is the patient wearing?: Underwear/pull up, Pants, Orthosis     Lower body assist Assist for lower body dressing: Supervision/Verbal cueing     Toileting Toileting    Toileting assist Assist for toileting: Supervision/Verbal cueing     Transfers Chair/bed transfer  Transfers assist     Chair/bed transfer assist level: Minimal Assistance - Patient > 75%  Locomotion Ambulation   Ambulation assist      Assist level: Minimal Assistance - Patient > 75% Assistive device: Cane-quad Max distance: 75ft   Walk 10 feet activity   Assist     Assist level: Minimal Assistance - Patient > 75% Assistive device: Cane-quad   Walk 50 feet activity   Assist    Assist level: Minimal Assistance - Patient > 75% Assistive device: Cane-quad    Walk 150  feet activity   Assist Walk 150 feet activity did not occur: Safety/medical concerns         Walk 10 feet on uneven surface  activity   Assist Walk 10 feet on uneven surfaces activity did not occur: Safety/medical concerns         Wheelchair     Assist Is the patient using a wheelchair?: No             Wheelchair 50 feet with 2 turns activity    Assist            Wheelchair 150 feet activity     Assist          Blood pressure (!) 127/50, pulse 64, temperature (!) 97.4 F (36.3 C), resp. rate 17, height 5\' 7"  (1.702 m), weight 113.7 kg, SpO2 97 %.  Medical Problem List and Plan: 1. 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  Antithrombotics: -DVT/anticoagulation:  Pharmaceutical: Lovenox 40mg  QD -antiplatelet therapy: Aspirin 81mg  QD and Plavix 75mg  QD for three weeks followed by aspirin alone   3. Pain Management: Tylenol and Robaxin as needed   4.Depression: continue celexa   5. Neuropsych/cognition: This patient is capable of making decisions on her own behalf.   6. Skin/Wound Care: Routine skin care checks   7. Fluids/Electrolytes/Nutrition: Routine Is and Os and follow-up chemistries             -carb modified diet  -04/21/23 d/c IV today, pt appreciative of this   8: Hypertension: monitor TID and prn (home Lasix not restarted)             -continue losartan 50 mg daily   -Supplement 2grams IV magnesium on 4/25  Magnesium gluconante 250mg  ordered HS Vitals:   04/18/23 1940 04/19/23 0524 04/19/23 1341 04/19/23 1940  BP: (!) 166/83 (!) 120/55 (!) 147/81 (!) 178/95   04/19/23 2046 04/20/23 0424 04/20/23 1319 04/20/23 1926  BP: (!) 150/73 (!) 131/53 138/65 (!) 155/71   04/21/23 0423 04/21/23 1325 04/21/23 1940 04/22/23 0616  BP: 137/69 132/75 (!) 148/71 (!) 127/50    9:  Hyperlipidemia: continue rosuvastatin 20mg  QD   10: HIV: continue Biktarvy 1 tab QD   11: DM-2: A1c 8.0%; CBGs four times daily; (home meds include Lantus Solostar, Glucotrol XL and Ozempic)             -continue SSI             -continue Semglee 12 units BID             -continue meal coverage 5 units TID   -placed dietary order for no sugar snack tray  -glucerna decreased to daily -4/27- pt's Cbgs running 190s to low 200s- will increase Semglee to 14 units BID -04/21/23  CBGs still high, but just changed semglee yesterday afternoon, monitor trend, may need further adjustments CBG (last 3)  Recent Labs    04/21/23 2103 04/22/23 0620 04/22/23 1122  GLUCAP 258* 168* 160*    12: Moderate persistent asthma: continue Dulera 2 puffs BID. Duoneb Nebulizer scheduled BID. Tessalon 100mg  BID. Incentive spirometer ordered   13: Allergic rhinitis: continue Claritin 10mg  QD             -start Flonase 1 spray each nostril QD   14: Hyponatremia: discussed low Na, monitor weekly -4/27- Last Na 131- will monitor for trend and if dips below 130, will intervene, monitor routine labs   15: Stress urinary incontinence: placed nursing order to take patient to bathroom q2H during the day to minimize incontinence  16. Hypoalbuminemia: Glucerna ordered.  17. Bilateral knee instability: knee sleeves ordered and discussed that they can provide support and stability  18. Left lower extremity swelling: vas [place] ordered and reviewed, is negative for clot, SCDs ordered.     LOS: 6 days A FACE TO FACE EVALUATION WAS PERFORMED  Clint Bolder P Kartik Fernando 04/22/2023, 1:24 PM

## 2023-04-22 NOTE — Progress Notes (Signed)
Physical Therapy Session Note  Patient Details  Name: Latoya Roth MRN: [id number] Date of Birth: 03/21/1971  Today's Date: 04/22/2023 PT Individual Time: 4782-9562 + 1415-1525 PT Individual Time Calculation (min): 55 min  + 70 min  Short Term Goals: Week 1:  PT Short Term Goal 1 (Week 1): STG = LTG due to ELOS  Skilled Therapeutic Interventions/Progress Updates:      1st session: Pt sitting in recliner to start - no reports of pain and agreeable to therapy. Reports need to toilet prior to leaving her room. Sit<>Stand with supervision to rollator from recliner - ambulates with SBA and rollator into the bathroom - R knee buckling while navigating threshold but self corrected. Able to manage shorts/brief in standing and continent of bladder - charted.   Ambulated to main rehab gym with supervision and rollator, >169ft. Adequate safety awareness while navigating turns and busy hallways. Instructed in to assess endurance - pt ambulated a total of 562ft with rollator and supervision - no LOB or knee buckling observed. She required x1 brief seated at minute 4.5. Educated on fall prevention, energy conservation strategies.   Pt instructed in stair navigation training - navigated up/down x12, 6inch steps, with CGA and 2 hand rails. Step-to pattern for both ascent and descent - no knee buckling observed but becomes fatigues quickly.   Pt assisted onto the Kinetron with CGA - completed x10 minutes at L30cm/sec resistance - cues for maintaining cadence and increasing ROM bilaterally.   Discussed DC planning, home safety, DME rec's, and follow up therapy. Patient reports she will have access to transportation for OP therapies, will need a rollator. Pt also making quicker than anticipated gains - messaged care team regarding moving up DC date as patient amenable.   Pt ambulated back to her room, ~159ft, with supervision and rollator - able to safely take steps backwards towards her recliner  before sitting with supervision assist. Provided her with an iced drink per her request at end of session. Pt asking to speak to CSW who was notified. Safety belt alarm on and all needs met.    2nd session: Pt asleep in recliner - wakes up to voice and agreeable to therapy. No reports of pain but requests to use restroom. Sit<>Stand to rollator with supervision and ambulates with supervision into the bathroom - completes 3/3 toileting tasks without assist - charted in flowsheets continent of bladder void. Handwashing at the sink completed without assist. Ambulated from her room to [facility] elevator with sitting rest break before going downstairs, outdoors near [facility]. Patient ambulating supervision and rollator while indoors, x2 instances of R knee buckling that were self corrected. Patient reporting fatigue from busy day of therapies and more ambulating than she's used to. While outdoors, addressed community mobility while ambulating on unlevel and sloped, paved surfaces. Patient requiring CGA and rollator while ambulating outside, distances >133ft. Seated rest breaks needed on park benches. Increased fear of falling while ambulating outdoors, reports she hardly goes outside at home. Returned indoors with seated rest break in the atrium. Patient quite fatigued at this point so allowed extended rest break >5 minutes for muscle recovery. Ambulated back upstairs via elevator to CIR floor and assisted onto Nustep. Completed 3x5 minutes at L5 resistance using BUE/BLE to target cardiovascular endurance and general strengthening. Patient with slow cadence 2/2 fatigue. 445 steps completed in total. Ambulated back towards her room, ~146ft, with CGA (due to fatigue) and rollator. Required sitting rest break and being pushed on rollator back  to her room. Ambulatory transfer with CGA and no AD to the recliner. All needs met with NT present for linen change.     Therapy Documentation Precautions:  Precautions Precautions:  Fall Precaution Comments: HIV; R hemi Restrictions Weight Bearing Restrictions: No General:      Therapy/Group: Individual Therapy  Latoya Roth P [name]  PT, DPT, CSRS  04/22/2023, 7:32 AM

## 2023-04-22 NOTE — Progress Notes (Signed)
Occupational Therapy Session Note  Patient Details  Name: Latoya Roth MRN: [id number] Date of Birth: 02-02-1971  Today's Date: 04/22/2023 OT Individual Time: 0800-0911 OT Individual Time Calculation (min): 71 min    Short Term Goals: Week 1:  OT Short Term Goal 1 (Week 1): STG=LTG  Skilled Therapeutic Interventions/Progress Updates:    Patient received seated in wheelchair.  Ambulated with rolaltor to bathroom with close supervision and cueing.  Patient seated to shower standing with use of grab bar at times. Patient able to use figure four to wash feet, using right hand to assist right leg into position.  Top of left foot very swollen - not painful, alerted PT/Nursing/MD.  MD ordered US.   Patient able to dress herself including donning knee sleeves and socks/ shoes/ tying shoes bimanually.   Walked to ADL apartment with 4 wheeled walker and contact guard to supervision.  Practiced tub transfer bench transfer, and reviewed equipment needs - suction cup grab bar.     Patient amenable to possibly going home earlier then planned if appropriate.   Left up in recliner in room with feet elevated, personal items in reach including call bell, and safety belt in place/engaged.  Patient may no longer need safety belt - will discuss with team.    Therapy Documentation Precautions:  Precautions Precautions: Fall Precaution Comments: HIV; R hemi Restrictions Weight Bearing Restrictions: No  Pain:  Denies pain - although left foot edematous - informed team.    Therapy/Group: Individual Therapy  [name] 04/22/2023, 9:13 AM

## 2023-04-22 NOTE — Progress Notes (Signed)
Patient ID: Latoya Roth, female   DOB: 1971/09/01, 52 y.o.   MRN: [id number] Met with the patient to review current situation, rehab process, team conference and plan of care. Patient is doing well overall and noted she would be in agreement if she could go home sooner than later. Reviewed medications, and dietary modification recommendations to manage secondary risks including HTN, HLD  (LDL 92/Trig 241) and DM (A1C 8.0). Discussed DAPT x 3 weeks then ASA solo per neurology. Also reviewed ETOH and stroke risk/diabetes. Continue to follow along to address educational needs to facilitate prep for discharge. Pamelia Hoit

## 2023-04-22 NOTE — Progress Notes (Signed)
Left lower extremity venous study completed.   Preliminary results relayed to RN.  Please see CV Procedures for preliminary results.  Yasuo Phimmasone, RVT  11:08 AM 04/22/23

## 2023-04-23 DIAGNOSIS — I6389 Other cerebral infarction: Secondary | ICD-10-CM | POA: Diagnosis not present

## 2023-04-23 LAB — GLUCOSE, CAPILLARY
Glucose-Capillary: 150 mg/dL — ABNORMAL HIGH (ref 70–99)
Glucose-Capillary: 83 mg/dL (ref 70–99)
Glucose-Capillary: 163 mg/dL — ABNORMAL HIGH (ref 70–99)
Glucose-Capillary: 201 mg/dL — ABNORMAL HIGH (ref 70–99)

## 2023-04-23 MED ORDER — FLUTICASONE PROPIONATE 50 MCG/ACT NA SUSP: 1.0000 | Freq: Every day | NASAL | Status: DC | PRN

## 2023-04-23 MED ORDER — BENZONATATE 100 MG PO CAPS: 100.0000 mg | ORAL_CAPSULE | Freq: Two times a day (BID) | ORAL | Status: DC | PRN

## 2023-04-23 NOTE — Progress Notes (Signed)
Patient ID: Latoya Roth, female   DOB: [birth year], 52 y.o.   MRN: [id number]  Sw made attempt to see patient to follow up on questions/concerns. Pt currently not in room, Sw will continue to FU.

## 2023-04-23 NOTE — Progress Notes (Signed)
Inpatient Rehabilitation Discharge Medication Review by a Pharmacist  A complete drug regimen review was completed for this patient to identify any potential clinically significant medication issues.  High Risk Drug Classes Is patient taking? Indication by Medication  Antipsychotic No   Anticoagulant No   Antibiotic Yes Biktarvy - HIV  Opioid No   Antiplatelet Yes Aspirin - CVA  Hypoglycemics/insulin Yes Lantus, Ozempic - DM  Vasoactive Medication Yes Losartan - BP  Chemotherapy No   Other Yes Benzonatate - prn cough Citalopram - mood Rosuvastatin - HLD Combivent, Symbicort - COPD, asthma     Type of Medication Issue Identified Description of Issue Recommendation(s)  Drug Interaction(s) (clinically significant)     Duplicate Therapy     Allergy     No Medication Administration End Date     Incorrect Dose     Additional Drug Therapy Needed     Significant med changes from prior encounter (inform family/care partners about these prior to discharge).    Other       Clinically significant medication issues were identified that warrant physician communication and completion of prescribed/recommended actions by midnight of the next day:  No    Pharmacist comments: None  Time spent performing this drug regimen review (minutes):  20 minutes   Thank you Okey Regal, PharmD

## 2023-04-23 NOTE — Progress Notes (Signed)
Occupational Therapy Discharge Summary  Patient Details  Name: Latoya Roth MRN: [id number] Date of Birth: 05-01-1971  Date of Discharge from OT service:April 23, 2023  Today's Date: 04/23/2023 OT Individual Time: 1000-1115 and 1300-1357 OT Individual Time Calculation (min): 75 min and 57 min  Skilled Therapeutic Intervention: First Session:  Patient received seated in recliner in room.  Patient made modified independent in room, and feels very good that she ambulated x 2 to bathrooom without incident.  Patient walked to gym to complete discharge testing, and also to address household management skills - making bed, laundry, emptying trash.  Also worked on floor recovery in apartment.  Patien tneeding cueing to use legs to push up from the floor versus trying to pull herself up with arms.   Patient will not cook initially when she returns, either son or sister will assist.  Patient will have supervision intiially with showering.  Patient aware of her weaknesses and has safe plan to address.  Patient returned to room and left up in recliner.    Second Session:  Patient received seated in recliner in room. Ambulated to gym to address coordination and balance  Worked to improve dynamic standing - throwing and catching a ball in standing, juggling two smaller balls.  Worked to improve right hand dominance - shuffling cards, manipulating coins, etc.  Patient needed cueing for ulnar aspect of hand to maintain objects in hand allowing radial aspect of hand to maneuver small items.  Patient ambulated back to room and left up in recliner.    Patient has met 14 of 14 long term goals due to improved activity tolerance, improved balance, postural control, ability to compensate for deficits, functional use of  RIGHT upper and RIGHT lower extremity, improved attention, improved awareness, and improved coordination.  Patient to discharge at overall Modified Independent level.  Patient's care partner is  independent to provide the necessary  occasional assistance/ supervision  assistance at discharge.    Reasons goals not met: NA  Recommendation:  Patient will benefit from ongoing skilled OT services in outpatient setting to continue to advance functional skills in the area of BADL, iADL, and Reduce care partner burden.  Equipment: Tub transfer bench, BSC, [id number]  Reasons for discharge: treatment goals met and discharge from hospital  Patient/family agrees with progress made and goals achieved: Yes  OT Discharge Precautions/Restrictions  Precautions Precautions: Fall Precaution Comments: HIV; R hemi Restrictions Weight Bearing Restrictions: No  Pain Assessment Pain Score: 0-No pain ADL ADL Eating: Independent Where Assessed-Eating: Chair Grooming: Modified independent Where Assessed-Grooming: Standing at sink Upper Body Bathing: Modified independent Where Assessed-Upper Body Bathing: Shower Lower Body Bathing: Modified independent Where Assessed-Lower Body Bathing: Shower Upper Body Dressing: Modified independent (Device) Where Assessed-Upper Body Dressing: Chair Lower Body Dressing: Modified independent Where Assessed-Lower Body Dressing: Standing at sink, Sitting at sink Toileting: Modified independent Where Assessed-Toileting: Toilet, Bedside Commode Toilet Transfer: Modified independent Statistician Method: Proofreader: Grab bars, Raised toilet seat Tub/Shower Transfer: Close supervison Web designer Method: Ship broker: Insurance underwriter: Modified independent Film/video editor Method: Designer, industrial/product: Emergency planning/management officer ADL Comments: Patient has tub shower - ordered tub trasnfer bench and BSC Vision Baseline Vision/History: 0 No visual deficits Patient Visual Report: No change from baseline Vision Assessment?: No apparent visual deficits Eye Alignment:  Within Functional Limits Ocular Range of Motion: Within Functional Limits Alignment/Gaze Preference: Within Defined Limits Tracking/Visual Pursuits: Able to track stimulus in all  quads without difficulty Saccades: Within functional limits Convergence: Within functional limits Perception  Perception: Within Functional Limits Praxis Praxis: Intact Cognition Cognition Overall Cognitive Status: Within Functional Limits for tasks assessed Arousal/Alertness: Awake/alert Orientation Level: Person;Place;Situation Person: Oriented Place: Oriented Situation: Oriented Memory: Appears intact Attention: Alternating Sustained Attention: Appears intact Sustained Attention Impairment: Functional complex Selective Attention: Appears intact Awareness: Appears intact Awareness Impairment: Anticipatory impairment Problem Solving Impairment: Functional complex Executive Function: Reasoning;Sequencing;Organizing;Decision Making;Initiating;Self Monitoring;Self Correcting Reasoning: Appears intact Sequencing: Appears intact Sequencing Impairment: Functional basic Organizing: Appears intact Organizing Impairment: Functional basic Decision Making: Appears intact Initiating: Appears intact Self Monitoring: Appears intact Self Correcting: Appears intact Safety/Judgment: Appears intact Brief Interview for Mental Status (BIMS) Repetition of Three Words (First Attempt): 3 Temporal Orientation: Year: Correct Temporal Orientation: Month: Accurate within 5 days Temporal Orientation: Day: Correct Recall: "Sock": Yes, no cue required Recall: "Blue": Yes, no cue required Recall: "Bed": Yes, no cue required BIMS Summary Score: 15 Sensation Sensation Light Touch: Appears Intact Hot/Cold: Appears Intact Proprioception: Appears Intact Stereognosis: Impaired by gross assessment (mildly impaired) Additional Comments: Improved since admission Coordination Gross Motor Movements are Fluid and Coordinated:  No Fine Motor Movements are Fluid and Coordinated: No Coordination and Movement Description: R hemi (mild) Finger Nose Finger Test: overshooting consistently RUE 9 Hole Peg Test: 39.22 RIGHT       31.04 LEFT Motor  Motor Motor: Hemiplegia Motor - Discharge Observations: mild hemiplegia Mobility  Bed Mobility Bed Mobility: Supine to Sit;Sit to Supine Supine to Sit: Independent Sit to Supine: Independent Transfers Sit to Stand: Independent with assistive device Stand to Sit: Independent with assistive device  Trunk/Postural Assessment  Cervical Assessment Cervical Assessment: Within Functional Limits Thoracic Assessment Thoracic Assessment: Within Functional Limits Lumbar Assessment Lumbar Assessment: Within Functional Limits Postural Control Postural Control: Deficits on evaluation Trunk Control: Lacks trunk stability/ core control - excessive sway noted in walking  Balance Balance Balance Assessed: Yes Static Sitting Balance Static Sitting - Balance Support: No upper extremity supported;Feet supported Static Sitting - Level of Assistance: 7: Independent Dynamic Sitting Balance Dynamic Sitting - Balance Support: No upper extremity supported;During functional activity;Feet supported Dynamic Sitting - Level of Assistance: 7: Independent Dynamic Sitting - Balance Activities: Lateral lean/weight shifting;Forward lean/weight shifting;Reaching for objects Static Standing Balance Static Standing - Balance Support: Right upper extremity supported;Left upper extremity supported;During functional activity Static Standing - Level of Assistance: 6: Modified independent (Device/Increase time) Dynamic Standing Balance Dynamic Standing - Balance Support: Right upper extremity supported;Left upper extremity supported;During functional activity Dynamic Standing - Level of Assistance: 6: Modified independent (Device/Increase time) Dynamic Standing - Balance Activities: Lateral lean/weight  shifting;Forward lean/weight shifting;Reaching for objects Extremity/Trunk Assessment RUE Assessment RUE Assessment: Exceptions to [facility] Active Range of Motion (AROM) Comments: Has full active range of motion - slight compensation noted beyond~ 150* flex General Strength Comments: 4+/5 shoulder,  52 lb grip RUE Body System: Neuro Brunstrum levels for arm and hand: Arm;Hand Brunstrum level for arm: Stage V Relative Independence from Synergy Brunstrum level for hand: Stage VI Isolated joint movements LUE Assessment LUE Assessment: Within Functional Limits General Strength Comments: 50lb grip, 5/5 strength   [name] 04/23/2023, 2:18 PM

## 2023-04-23 NOTE — Progress Notes (Signed)
Inpatient Rehabilitation Care Coordinator Discharge Note   Patient Details  Name: ZETTA STONEMAN MRN: [id number] Date of Birth: 08-Jun-1971   Discharge location: Home  Length of Stay: 8 Days  Discharge activity level: Mod I/sup  Home/community participation: God sister and son  Patient response WU:JWJXBJ Literacy - How often do you need to have someone help you when you read instructions, pamphlets, or other written material from your doctor or pharmacy?: Rarely  Patient response YN:WGNFAO Isolation - How often do you feel lonely or isolated from those around you?: Never  Services provided included: SW, Pharmacy, TR, CM, SLP, RN, OT, PT, RD, MD  Financial Services:  Financial Services Utilized: Medicaid    Choices offered to/list presented to: patient  Follow-up services arranged:  Outpatient, DME    Outpatient Servicies: [facility] DME : Rollator, BSC and TTB- [employer]    Patient response to transportation need: Is the patient able to respond to transportation needs?: Yes In the past 12 months, has lack of transportation kept you from medical appointments or from getting medications?: No In the past 12 months, has lack of transportation kept you from meetings, work, or from getting things needed for daily living?: No   Patient/Family verbalized understanding of follow-up arrangements:  Yes  Individual responsible for coordination of the follow-up plan: self  Confirmed correct DME delivered: Andria Rhein 04/23/2023    Comments (or additional information):  Summary of Stay    Date/Time Discharge Planning CSW  04/23/23 1500 Patient discharging home tomorrow with Op reccomendations CJB       Andria Rhein

## 2023-04-23 NOTE — Progress Notes (Signed)
PROGRESS NOTE   Subjective/Complaints: Discussed that vascular ultrasound shows no clots.  Discussed plan for d/c home tomorrow Improved blood sugar control  ROS:  Pt denies SOB, abd pain, CP, N/V/C/D, and vision changes, +left lower extremity swelling  Objective:   VAS [place] LOWER EXTREMITY VENOUS (DVT)  Result Date: 04/22/2023  Lower Venous DVT Study Patient Name:  Latoya Roth  Date of Exam:   04/22/2023 Medical Rec #: [id number]            Accession #:    [id number] Date of Birth: November 03, 1971            Patient Gender: F Patient Age:   52 years Exam Location:  [facility] Procedure:      VAS [place] LOWER EXTREMITY VENOUS (DVT) Referring Phys: Sula Soda --------------------------------------------------------------------------------  Indications: Pain, and Swelling.  Limitations: Patient pain and body habitus. Comparison Study: Previous 02/13/22 negative. Performing Technologist: McKayla Maag RVT, [place]  Examination Guidelines: A complete evaluation includes B-mode imaging, spectral Doppler, color Doppler, and power Doppler as needed of all accessible portions of each vessel. Bilateral testing is considered an integral part of a complete examination. Limited examinations for reoccurring indications may be performed as noted. The reflux portion of the exam is performed with the patient in reverse Trendelenburg.  +-----+---------------+---------+-----------+----------+--------------+ RIGHTCompressibilityPhasicitySpontaneityPropertiesThrombus Aging +-----+---------------+---------+-----------+----------+--------------+ CFV  Full           Yes      Yes                                 +-----+---------------+---------+-----------+----------+--------------+ SFJ  Full                                                        +-----+---------------+---------+-----------+----------+--------------+    +--------+---------------+---------+-----------+----------+--------------------+ LEFT    CompressibilityPhasicitySpontaneityPropertiesThrombus Aging       +--------+---------------+---------+-----------+----------+--------------------+ CFV                    Yes      Yes                  Patent by color                                                           doppler              +--------+---------------+---------+-----------+----------+--------------------+ SFJ                                                  Patent by color  doppler              +--------+---------------+---------+-----------+----------+--------------------+ FV Prox                Yes      Yes                  Patent by color                                                           doppler              +--------+---------------+---------+-----------+----------+--------------------+ FV Mid  Full                                                              +--------+---------------+---------+-----------+----------+--------------------+ FV                     Yes      Yes                  Patent by color      Distal                                               doppler              +--------+---------------+---------+-----------+----------+--------------------+ PFV                    Yes      Yes                  Patent by color                                                           doppler              +--------+---------------+---------+-----------+----------+--------------------+ POP     Full           Yes      Yes                                       +--------+---------------+---------+-----------+----------+--------------------+ PTV     Full                                         Not well visualized  +--------+---------------+---------+-----------+----------+--------------------+ PERO     Full                                         Not well visualized  +--------+---------------+---------+-----------+----------+--------------------+  Summary: RIGHT: - No evidence of common femoral vein obstruction.  LEFT: - There is no evidence of deep vein thrombosis in the lower extremity. However, portions of this examination were limited- see technologist comments above.  *See table(s) above for measurements and observations. Electronically signed by Lemar Livings MD on 04/22/2023 at 5:20:19 PM.    Final    Recent Labs    04/22/23 0554  WBC 6.6  HGB 11.9*  HCT 37.3  PLT 357    Recent Labs    04/22/23 0554  NA 136  K 3.9  CL 101  CO2 28  GLUCOSE 161*  BUN 9  CREATININE 0.72  CALCIUM 9.1     Intake/Output Summary (Last 24 hours) at 04/23/2023 1110 Last data filed at 04/23/2023 0736 Gross per 24 hour  Intake 928 ml  Output --  Net 928 ml        Physical Exam: Vital Signs Blood pressure (!) 143/97, pulse 78, temperature 98 F (36.7 C), temperature source Oral, resp. rate 18, height 5\' 7"  (1.702 m), weight 113.7 kg, SpO2 98 %. Gen: no distress, normal appearing, BMI 39.26 HEENT: oral mucosa pink and moist, NCAT Cardio: Reg rate Chest: normal effort, normal rate of breathing Abd: soft, non-distended Ext: +LLE edema Psych: pleasant, normal affect Skin: intact  PRIOR EXAMS: Musculoskeletal:     Cervical back: Neck supple. No tenderness.     Comments: LUE 5/5 in same muscles RUE- 4/5 proximal; grip 3+/5; FA 2/5 LLE- 5/5 in same muscles RLE- HF 4/5; KE/KF 4-/5; DF/PF 4-/5  Crepitus in right knee Skin:    General: Skin is warm and dry.     Comments: IV L AC fossa- looks OK Trace LE edema to ankles B/L  Neurological:     Mental Status: She is alert.     Comments: Decreased to light touch on R side arm and leg No clonus, but has brisk Hoffman's RUE Difficulty with slurring words Naming intact and repetition intact- just hard to understand  Ambulating  with NBQC with minA  Psychiatric:        Thought Content: Thought content normal.        Judgment: Judgment normal.     Comments: No longer tearful   Assessment/Plan: 1. Functional deficits which require 3+ hours per day of interdisciplinary therapy in a comprehensive inpatient rehab setting. Physiatrist is providing close team supervision and 24 hour management of active medical problems listed below. Physiatrist and rehab team continue to assess barriers to discharge/monitor patient progress toward functional and medical goals  Care Tool:  Bathing    Body parts bathed by patient: Right arm, Chest, Abdomen, Right upper leg, Left upper leg, Face, Right lower leg, Left lower leg, Left arm, Front perineal area, Buttocks   Body parts bathed by helper: Front perineal area, Buttocks, Left lower leg, Right lower leg     Bathing assist Assist Level: Supervision/Verbal cueing     Upper Body Dressing/Undressing Upper body dressing   What is the patient wearing?: Bra, Pull over shirt    Upper body assist Assist Level: Set up assist    Lower Body Dressing/Undressing Lower body dressing      What is the patient wearing?: Underwear/pull up, Pants, Orthosis     Lower body assist Assist for lower body dressing: Supervision/Verbal cueing     Toileting Toileting    Toileting assist Assist for toileting: Supervision/Verbal cueing     Transfers Chair/bed transfer  Transfers assist     Chair/bed  transfer assist level: Independent with assistive device Chair/bed transfer assistive device: Geologist, engineering   Ambulation assist      Assist level: Independent with assistive device Assistive device: Rollator Max distance: 549ft   Walk 10 feet activity   Assist     Assist level: Independent with assistive device Assistive device: Rollator   Walk 50 feet activity   Assist    Assist level: Independent with assistive device Assistive device: Rollator     Walk 150 feet activity   Assist Walk 150 feet activity did not occur: Safety/medical concerns  Assist level: Independent with assistive device Assistive device: Rollator    Walk 10 feet on uneven surface  activity   Assist Walk 10 feet on uneven surfaces activity did not occur: Safety/medical concerns   Assist level: Supervision/Verbal cueing Assistive device: Rollator   Wheelchair     Assist Is the patient using a wheelchair?: No             Wheelchair 50 feet with 2 turns activity    Assist            Wheelchair 150 feet activity     Assist          Blood pressure (!) 143/97, pulse 78, temperature 98 F (36.7 C), temperature source Oral, resp. rate 18, height 5\' 7"  (1.702 m), weight 113.7 kg, SpO2 98 %.  Medical Problem List and Plan: 1. 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  Antithrombotics: -DVT/anticoagulation:  Pharmaceutical: Lovenox 40mg  QD -antiplatelet therapy: Aspirin 81mg  QD and Plavix 75mg  QD for three weeks followed by aspirin alone   3. Pain Management: Tylenol and Robaxin as needed   4.Depression: continue celexa   5. Neuropsych/cognition: This patient is capable of making decisions on her own behalf.   6. Skin/Wound Care: Routine skin care checks   7. Fluids/Electrolytes/Nutrition: Routine Is and Os and follow-up chemistries             -carb modified diet  -04/21/23 d/c IV today, pt appreciative of this   8: Hypertension: monitor TID and prn (home Lasix not restarted)             -continue losartan 50 mg daily   -Supplement 2grams IV magnesium on 4/25  Magnesium gluconante 250mg  ordered HS Vitals:   04/19/23 1940 04/19/23 2046 04/20/23 0424 04/20/23 1319  BP: (!) 178/95 (!) 150/73 (!) 131/53 138/65   04/20/23 1926 04/21/23 0423 04/21/23 1325 04/21/23 1940   BP: (!) 155/71 137/69 132/75 (!) 148/71   04/22/23 0616 04/22/23 1544 04/22/23 2013 04/23/23 0619  BP: (!) 127/50 (!) 143/69 (!) 161/79 (!) 143/97    9: Hyperlipidemia: continue rosuvastatin 20mg  QD   10: HIV: continue Biktarvy 1 tab QD   11: DM-2: A1c 8.0%; CBGs four times daily; (home meds include Lantus Solostar, Glucotrol XL and Ozempic)             -continue SSI             -continue Semglee 12 units BID             -continue meal coverage 5 units TID   -placed dietary order for no sugar snack tray  -glucerna decreased  to daily -4/27- pt's Cbgs running 190s to low 200s- will increase Semglee to 14 units BID -04/21/23 CBGs still high, but just changed semglee yesterday afternoon, monitor trend, may need further adjustments CBG (last 3)  Recent Labs    04/22/23 1648 04/22/23 2044 04/23/23 0614  GLUCAP 193* 123* 150*    12: Moderate persistent asthma: continue Dulera 2 puffs BID. Duoneb Nebulizer scheduled BID. Change Tessalon to 100mg  BID PRN. Incentive spirometer ordered   13: Allergic rhinitis: continue Claritin 10mg  QD             -change Flonase to 1 spray each nostril QD prn   14: Hyponatremia: discussed low Na, monitor outpatient.   15: Stress urinary incontinence: placed nursing order to take patient to bathroom q2H during the day to minimize incontinence  16. Hypoalbuminemia: Glucerna ordered. Decreased to daily given elevated blood sugars.   17. Bilateral knee instability: knee sleeves ordered and discussed that they can provide support and stability  18. Left lower extremity swelling: vas [place] ordered and reviewed, is negative for clot, SCDs ordered. Discussed with patient.     LOS: 7 days A FACE TO FACE EVALUATION WAS PERFORMED  Josecarlos Harriott P Shaul Trautman 04/23/2023, 11:10 AM

## 2023-04-23 NOTE — Progress Notes (Signed)
Physical Therapy Session Note  Patient Details  Name: TRAMAINE SNELL MRN: [id number] Date of Birth: January 13, 1971  Today's Date: 04/23/2023 PT Individual Time: 0800-0911 PT Individual Time Calculation (min): 71 min   Short Term Goals: Week 1:  PT Short Term Goal 1 (Week 1): STG = LTG due to ELOS  Skilled Therapeutic Interventions/Progress Updates:      Pt sitting in recliner to start - agreeable to therapy session. Reports muscle soreness in her legs from yesterday's therapy session - she requests a wheelchair transport to main rehab gym due to soreness. Patient completed stand pivot transfer with supervision and no AD to wheelchair. Propelled herself in w/c ~155ft with supervision using BUE - difficulty maintaining straight path due to RUE weakness. Transported remaining distance for energy conservation.   In rehab gym, retested BERG and TUG with results outlined below. Several seated rest breaks during testing as needed for fatigue and recovery.   Mat table there-ex and stretching provided to address muscle soreness from yesterday. Hamstring, hip abd/add, and quad stretching to tolerance, education on technique and positioning for future use.  -2x15 bridges -2x15 knee to chest, bilaterally -2x15 hamstring curls in prone position -2x10 sidelying clamshells  Returned to her room and provided her with MOD I sign outside the door. LPN made aware as well. Pt left sitting in recliner in her room at end of session, all needs met.     Therapy Documentation Precautions:  Precautions Precautions: Fall Precaution Comments: HIV; R hemi Restrictions Weight Bearing Restrictions: No General:    Balance Balance Assessed: Yes Standardized Balance Assessment Standardized Balance Assessment: Berg Balance Test;Timed Up and Go Test Berg Balance Test Sit to Stand: Able to stand without using hands and stabilize independently Standing Unsupported: Able to stand safely 2 minutes Sitting with  Back Unsupported but Feet Supported on Floor or Stool: Able to sit safely and securely 2 minutes Stand to Sit: Sits safely with minimal use of hands Transfers: Able to transfer safely, definite need of hands Standing Unsupported with Eyes Closed: Able to stand 10 seconds safely Standing Ubsupported with Feet Together: Able to place feet together independently and stand for 1 minute with supervision From Standing, Reach Forward with Outstretched Arm: Can reach confidently >25 cm (10") From Standing Position, Pick up Object from Floor: Able to pick up shoe safely and easily From Standing Position, Turn to Look Behind Over each Shoulder: Looks behind one side only/other side shows less weight shift Turn 360 Degrees: Able to turn 360 degrees safely but slowly Standing Unsupported, Alternately Place Feet on Step/Stool: Able to complete >2 steps/needs minimal assist Standing Unsupported, One Foot in Front: Able to take small step independently and hold 30 seconds Standing on One Leg: Tries to lift leg/unable to hold 3 seconds but remains standing independently Total Score: 43/56  Timed Up and Go Test TUG: Normal TUG Normal TUG (seconds): 24 (avg of 3 trials using rollator: 22 seconds, 26 seconds, 24 seconds)   Therapy/Group: Individual Therapy  Addaleigh Nicholls P Lakeyia Surber  PT, DPT, CSRS  04/23/2023, 8:35 AM

## 2023-04-23 NOTE — Plan of Care (Signed)

## 2023-04-23 NOTE — Progress Notes (Addendum)
Patient ID: Latoya Roth, female   DOB: 1971-11-22, 52 y.o.   MRN: [id number]  OP referral sent to [facility].  Patient declined due to insurance.  Op referral faxed to [facility].

## 2023-04-23 NOTE — Progress Notes (Signed)
Physical Therapy Discharge Summary  Patient Details  Name: Latoya Roth MRN: [id number] Date of Birth: Jan 08, 1971  Date of Discharge from PT service:April 23, 2023  Patient has met 8 of 8 long term goals due to improved activity tolerance, improved balance, increased strength, ability to compensate for deficits, functional use of  right upper extremity and right lower extremity, improved attention, and improved awareness.  Patient to discharge at an ambulatory level Modified Independent.   Patient's care partner is independent to provide the necessary physical assistance at discharge.  Functional outcome measures: Her BERG improved from 16/56 @ evaluation to 43/56 at discharge. Her TUG improved from 1 minute and 12 seconds @ evaluation to 24 seconds at discharge. Her on 4/29 was 566ft using her rollator.  Reasons goals not met: n/a  Recommendation:  Patient will benefit from ongoing skilled PT services in outpatient setting to continue to advance safe functional mobility, address ongoing impairments in R sided weakness, dynamic gait and balance, and minimize fall risk.  Equipment: rollator  Reasons for discharge: treatment goals met and discharge from hospital  Patient/family agrees with progress made and goals achieved: Yes  PT Discharge Precautions/Restrictions Precautions Precautions: Fall Precaution Comments: HIV; R hemi Restrictions Weight Bearing Restrictions: No Pain Interference Pain Interference Pain Effect on Sleep: 0. Does not apply - I have not had any pain or hurting in the past 5 days Pain Interference with Therapy Activities: 0. Does not apply - I have not received rehabilitationtherapy in the past 5 days Pain Interference with Day-to-Day Activities: 1. Rarely or not at all Vision/Perception  Vision - History Ability to See in Adequate Light: 0 Adequate Perception Perception: Within Functional Limits Praxis Praxis: Intact  Cognition Overall  Cognitive Status: Within Functional Limits for tasks assessed Arousal/Alertness: Awake/alert Orientation Level: Oriented X4 Focused Attention: Appears intact Sustained Attention: Appears intact Selective Attention: Appears intact Memory: Appears intact Awareness: Appears intact Sequencing: Appears intact Initiating: Appears intact Self Monitoring: Appears intact Self Correcting: Appears intact Safety/Judgment: Appears intact Sensation Sensation Light Touch: Appears Intact Hot/Cold: Appears Intact Proprioception: Appears Intact Stereognosis: Appears Intact Coordination Gross Motor Movements are Fluid and Coordinated: No Coordination and Movement Description: R hemi (mild) Motor  Motor Motor: Hemiplegia Motor - Discharge Observations: Improved since date of evaluation  Mobility Bed Mobility Bed Mobility: Supine to Sit;Sit to Supine Supine to Sit: Independent Sit to Supine: Independent Transfers Transfers: Sit to Stand;Stand to Sit;Stand Pivot Transfers Sit to Stand: Independent with assistive device Stand to Sit: Independent with assistive device Stand Pivot Transfers: Independent with assistive device Transfer (Assistive device): Rollator Locomotion  Gait Ambulation: Yes Gait Assistance: Independent with assistive device Gait Distance (Feet): 500 Feet Assistive device: Rollator Gait Gait: Yes Gait Pattern: Impaired Gait Pattern: Step-through pattern;Decreased step length - right;Decreased hip/knee flexion - right;Lateral trunk lean to right;Trunk flexed Stairs / Additional Locomotion Stairs: Yes Stairs Assistance: Supervision/Verbal cueing Stair Management Technique: Two rails;Step to pattern;Forwards Number of Stairs: 12 Height of Stairs: 6 Pick up small object from the floor assist level: Supervision/Verbal cueing Wheelchair Mobility Wheelchair Mobility: No  Trunk/Postural Assessment  Cervical Assessment Cervical Assessment: Within Functional  Limits Thoracic Assessment Thoracic Assessment: Within Functional Limits Lumbar Assessment Lumbar Assessment: Within Functional Limits Postural Control Postural Control: Deficits on evaluation  Balance Balance Balance Assessed: Yes Standardized Balance Assessment Standardized Balance Assessment: Berg Balance Test;Timed Up and Go Test Berg Balance Test Sit to Stand: Able to stand without using hands and stabilize independently Standing Unsupported: Able to stand safely  2 minutes Sitting with Back Unsupported but Feet Supported on Floor or Stool: Able to sit safely and securely 2 minutes Stand to Sit: Sits safely with minimal use of hands Transfers: Able to transfer safely, definite need of hands Standing Unsupported with Eyes Closed: Able to stand 10 seconds safely Standing Ubsupported with Feet Together: Able to place feet together independently and stand for 1 minute with supervision From Standing, Reach Forward with Outstretched Arm: Can reach confidently >25 cm (10") From Standing Position, Pick up Object from Floor: Able to pick up shoe safely and easily From Standing Position, Turn to Look Behind Over each Shoulder: Looks behind one side only/other side shows less weight shift Turn 360 Degrees: Able to turn 360 degrees safely but slowly Standing Unsupported, Alternately Place Feet on Step/Stool: Able to complete >2 steps/needs minimal assist Standing Unsupported, One Foot in Front: Able to take small step independently and hold 30 seconds Standing on One Leg: Tries to lift leg/unable to hold 3 seconds but remains standing independently Total Score: 43 Timed Up and Go Test TUG: Normal TUG Normal TUG (seconds): 24 (avg of 3 trials using rollator: 22 seconds, 26 seconds, 24 seconds) Extremity Assessment      RLE Assessment RLE Assessment: Exceptions to [facility] General Strength Comments: Grossly 4/5 LLE Assessment LLE Assessment: Within Functional Limits   Latoya Roth P Challis Crill   PT, DPT, CSRS  04/23/2023, 8:29 AM

## 2023-04-24 ENCOUNTER — Telehealth (HOSPITAL_COMMUNITY): Payer: Self-pay | Admitting: Pharmacy Technician

## 2023-04-24 ENCOUNTER — Other Ambulatory Visit (HOSPITAL_COMMUNITY): Payer: Self-pay

## 2023-04-24 DIAGNOSIS — I6389 Other cerebral infarction: Secondary | ICD-10-CM | POA: Diagnosis not present

## 2023-04-24 LAB — GLUCOSE, CAPILLARY
Glucose-Capillary: 136 mg/dL — ABNORMAL HIGH (ref 70–99)
Glucose-Capillary: 113 mg/dL — ABNORMAL HIGH (ref 70–99)
Glucose-Capillary: 235 mg/dL — ABNORMAL HIGH (ref 70–99)

## 2023-04-24 MED ORDER — ROSUVASTATIN CALCIUM 20 MG PO TABS
20.0000 mg | ORAL_TABLET | Freq: Every day | ORAL | 0 refills | Status: DC
  Filled 2023-04-24: qty 30, 30d supply, fill #0

## 2023-04-24 MED ORDER — CLOPIDOGREL BISULFATE 75 MG PO TABS
75.0000 mg | ORAL_TABLET | Freq: Every day | ORAL | 0 refills | Status: AC
  Filled 2023-04-24: qty 7, 7d supply, fill #0

## 2023-04-24 MED ORDER — LANTUS SOLOSTAR 100 UNIT/ML ~~LOC~~ SOPN: 14.0000 [IU] | PEN_INJECTOR | Freq: Two times a day (BID) | SUBCUTANEOUS | 0 refills | Status: DC

## 2023-04-24 MED ORDER — LOSARTAN POTASSIUM 50 MG PO TABS: 50.0000 mg | ORAL_TABLET | Freq: Every day | ORAL | Status: AC

## 2023-04-24 MED ORDER — ASPIRIN 81 MG PO TBEC: 81.0000 mg | DELAYED_RELEASE_TABLET | Freq: Every day | ORAL | 0 refills | Status: AC

## 2023-04-24 MED ORDER — FLUTICASONE PROPIONATE 50 MCG/ACT NA SUSP: 1.0000 | Freq: Every day | NASAL | 2 refills | Status: DC | PRN

## 2023-04-24 MED ORDER — INSULIN PEN NEEDLE 32G X 4 MM MISC
0 refills | Status: AC
  Filled 2023-04-24: qty 100, 25d supply, fill #0

## 2023-04-24 MED ORDER — BENZONATATE 100 MG PO CAPS
100.0000 mg | ORAL_CAPSULE | Freq: Two times a day (BID) | ORAL | 0 refills | Status: DC | PRN
  Filled 2023-04-24: qty 20, 10d supply, fill #0

## 2023-04-24 MED ORDER — ACETAMINOPHEN 325 MG PO TABS: 325.0000 mg | ORAL_TABLET | ORAL | Status: AC | PRN

## 2023-04-24 MED ORDER — INSULIN ASPART 100 UNIT/ML FLEXPEN
5.0000 [IU] | PEN_INJECTOR | Freq: Three times a day (TID) | SUBCUTANEOUS | 0 refills | Status: DC
  Filled 2023-04-24: qty 15, 90d supply, fill #0

## 2023-04-24 MED ORDER — CITALOPRAM HYDROBROMIDE 20 MG PO TABS
20.0000 mg | ORAL_TABLET | Freq: Every day | ORAL | 0 refills | Status: AC
  Filled 2023-04-24: qty 30, 30d supply, fill #0

## 2023-04-24 MED ORDER — GUAIFENESIN-DM 100-10 MG/5ML PO SYRP: 5.0000 mL | ORAL_SOLUTION | Freq: Four times a day (QID) | ORAL | 0 refills | Status: DC | PRN

## 2023-04-24 MED ORDER — OZEMPIC (0.25 OR 0.5 MG/DOSE) 2 MG/3ML ~~LOC~~ SOPN
0.5000 mg | PEN_INJECTOR | SUBCUTANEOUS | 0 refills | Status: DC
  Filled 2023-04-24: qty 3, 28d supply, fill #0

## 2023-04-24 MED ORDER — MAGNESIUM GLUCONATE 500 MG PO TABS: 250.0000 mg | ORAL_TABLET | Freq: Every day | ORAL | Status: DC

## 2023-04-24 NOTE — Progress Notes (Signed)
PROGRESS NOTE   Subjective/Complaints: No new complaints this morning Discussed her insulin regimen, restarting ozempic at home, discussed that she will continue to need short and long acting at home  ROS:  Pt denies SOB, abd pain, CP, N/V/C/D, and vision changes, +left lower extremity swelling  Objective:   VAS [place] LOWER EXTREMITY VENOUS (DVT)  Result Date: 04/22/2023  Lower Venous DVT Study Patient Name:  Latoya Roth  Date of Exam:   04/22/2023 Medical Rec #: [id number]            Accession #:    [id number] Date of Birth: 01/09/1971            Patient Gender: F Patient Age:   52 years Exam Location:  [facility] Procedure:      VAS [place] LOWER EXTREMITY VENOUS (DVT) Referring Phys: Sula Soda --------------------------------------------------------------------------------  Indications: Pain, and Swelling.  Limitations: Patient pain and body habitus. Comparison Study: Previous 02/13/22 negative. Performing Technologist: McKayla Maag RVT, [place]  Examination Guidelines: A complete evaluation includes B-mode imaging, spectral Doppler, color Doppler, and power Doppler as needed of all accessible portions of each vessel. Bilateral testing is considered an integral part of a complete examination. Limited examinations for reoccurring indications may be performed as noted. The reflux portion of the exam is performed with the patient in reverse Trendelenburg.  +-----+---------------+---------+-----------+----------+--------------+ RIGHTCompressibilityPhasicitySpontaneityPropertiesThrombus Aging +-----+---------------+---------+-----------+----------+--------------+ CFV  Full           Yes      Yes                                 +-----+---------------+---------+-----------+----------+--------------+ SFJ  Full                                                         +-----+---------------+---------+-----------+----------+--------------+   +--------+---------------+---------+-----------+----------+--------------------+ LEFT    CompressibilityPhasicitySpontaneityPropertiesThrombus Aging       +--------+---------------+---------+-----------+----------+--------------------+ CFV                    Yes      Yes                  Patent by color                                                           doppler              +--------+---------------+---------+-----------+----------+--------------------+ SFJ                                                  Patent by color  doppler              +--------+---------------+---------+-----------+----------+--------------------+ FV Prox                Yes      Yes                  Patent by color                                                           doppler              +--------+---------------+---------+-----------+----------+--------------------+ FV Mid  Full                                                              +--------+---------------+---------+-----------+----------+--------------------+ FV                     Yes      Yes                  Patent by color      Distal                                               doppler              +--------+---------------+---------+-----------+----------+--------------------+ PFV                    Yes      Yes                  Patent by color                                                           doppler              +--------+---------------+---------+-----------+----------+--------------------+ POP     Full           Yes      Yes                                       +--------+---------------+---------+-----------+----------+--------------------+ PTV     Full                                         Not well visualized   +--------+---------------+---------+-----------+----------+--------------------+ PERO    Full                                         Not well visualized  +--------+---------------+---------+-----------+----------+--------------------+  Summary: RIGHT: - No evidence of common femoral vein obstruction.  LEFT: - There is no evidence of deep vein thrombosis in the lower extremity. However, portions of this examination were limited- see technologist comments above.  *See table(s) above for measurements and observations. Electronically signed by Lemar Livings MD on 04/22/2023 at 5:20:19 PM.    Final    Recent Labs    04/22/23 0554  WBC 6.6  HGB 11.9*  HCT 37.3  PLT 357    Recent Labs    04/22/23 0554  NA 136  K 3.9  CL 101  CO2 28  GLUCOSE 161*  BUN 9  CREATININE 0.72  CALCIUM 9.1     Intake/Output Summary (Last 24 hours) at 04/24/2023 0932 Last data filed at 04/23/2023 1859 Gross per 24 hour  Intake 460 ml  Output --  Net 460 ml        Physical Exam: Vital Signs Blood pressure 137/65, pulse 68, temperature 97.9 F (36.6 C), resp. rate 17, height 5\' 7"  (1.702 m), weight 113.7 kg, SpO2 97 %. Gen: no distress, normal appearing, BMI 39.26 HEENT: oral mucosa pink and moist, NCAT Cardio: Reg rate Chest: normal effort, normal rate of breathing Abd: soft, non-distended Ext: +LLE edema Psych: pleasant, normal affect Skin: intact  PRIOR EXAMS: Musculoskeletal:     Cervical back: Neck supple. No tenderness.     Comments: LUE 5/5 in same muscles RUE- 4/5 proximal; grip 3+/5; FA 2/5 LLE- 5/5 in same muscles RLE- HF 4/5; KE/KF 4-/5; DF/PF 4-/5  Crepitus in right knee Skin:    General: Skin is warm and dry.     Comments: IV L AC fossa- looks OK Trace LE edema to ankles B/L  Neurological:     Mental Status: She is alert.     Comments: Decreased to light touch on R side arm and leg No clonus, but has brisk Hoffman's RUE Difficulty with slurring words Naming intact and  repetition intact- just hard to understand  Ambulating modI Psychiatric:        Thought Content: Thought content normal.        Judgment: Judgment normal.     Comments: No longer tearful   Assessment/Plan: 1. Functional deficits which require 3+ hours per day of interdisciplinary therapy in a comprehensive inpatient rehab setting. Physiatrist is providing close team supervision and 24 hour management of active medical problems listed below. Physiatrist and rehab team continue to assess barriers to discharge/monitor patient progress toward functional and medical goals  Care Tool:  Bathing    Body parts bathed by patient: Right arm, Chest, Abdomen, Right upper leg, Left upper leg, Face, Right lower leg, Left lower leg, Left arm, Front perineal area, Buttocks   Body parts bathed by helper: Front perineal area, Buttocks, Left lower leg, Right lower leg     Bathing assist Assist Level: Independent with assistive device     Upper Body Dressing/Undressing Upper body dressing   What is the patient wearing?: Bra, Pull over shirt    Upper body assist Assist Level: Independent with assistive device    Lower Body Dressing/Undressing Lower body dressing      What is the patient wearing?: Underwear/pull up, Pants     Lower body assist Assist for lower body dressing: Independent with assitive device     Toileting Toileting    Toileting assist Assist for toileting: Independent with assistive device     Transfers Chair/bed transfer  Transfers assist     Chair/bed transfer assist  level: Independent with assistive device Chair/bed transfer assistive device: Geologist, engineering   Ambulation assist      Assist level: Independent with assistive device Assistive device: Rollator Max distance: 59ft   Walk 10 feet activity   Assist     Assist level: Independent with assistive device Assistive device: Rollator   Walk 50 feet activity   Assist     Assist level: Independent with assistive device Assistive device: Rollator    Walk 150 feet activity   Assist Walk 150 feet activity did not occur: Safety/medical concerns  Assist level: Independent with assistive device Assistive device: Rollator    Walk 10 feet on uneven surface  activity   Assist Walk 10 feet on uneven surfaces activity did not occur: Safety/medical concerns   Assist level: Supervision/Verbal cueing Assistive device: Rollator   Wheelchair     Assist Is the patient using a wheelchair?: No             Wheelchair 50 feet with 2 turns activity    Assist            Wheelchair 150 feet activity     Assist          Blood pressure 137/65, pulse 68, temperature 97.9 F (36.6 C), resp. rate 17, height 5\' 7"  (1.702 m), weight 113.7 kg, SpO2 97 %.  Medical Problem List and Plan: 1. 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  Antithrombotics: -DVT/anticoagulation:  Pharmaceutical: Lovenox 40mg  QD -antiplatelet therapy: Aspirin 81mg  QD and Plavix 75mg  QD for three weeks followed by aspirin alone   3. Pain Management: Tylenol and Robaxin as needed   4.Depression: continue celexa   5. Neuropsych/cognition: This patient is capable of making decisions on her own behalf.   6. Skin/Wound Care: Routine skin care checks   7. Fluids/Electrolytes/Nutrition: Routine Is and Os and follow-up chemistries             -carb modified diet  -04/21/23 d/c IV today, pt appreciative of this   8: Hypertension: monitor TID and prn (home Lasix not restarted)             -continue losartan 50 mg daily   -Supplement 2grams IV magnesium on 4/25  Magnesium gluconante 250mg  ordered HS Vitals:   04/20/23 1319 04/20/23 1926 04/21/23 0423 04/21/23 1325  BP: 138/65 (!) 155/71 137/69 132/75   04/21/23 1940 04/22/23  0616 04/22/23 1544 04/22/23 2013  BP: (!) 148/71 (!) 127/50 (!) 143/69 (!) 161/79   04/23/23 0619 04/23/23 1402 04/23/23 1930 04/24/23 0604  BP: (!) 143/97 (!) 158/84 (!) 155/80 137/65    9: Hyperlipidemia: continue rosuvastatin 20mg  QD   10: HIV: continue Biktarvy 1 tab QD   11: DM-2: A1c 8.0%; CBGs four times daily; (home meds include Lantus Solostar, Glucotrol XL and Ozempic)             -continue SSI             -continue Semglee 12 units BID             -continue meal coverage 5 units TID   -placed dietary order for no sugar snack tray  -glucerna decreased to daily -4/27- pt's Cbgs running 190s to low 200s- will  increase Semglee to 14 units BID Discussed restarting Ozempic at home CBG (last 3)  Recent Labs    04/23/23 1637 04/23/23 2035 04/24/23 0606  GLUCAP 163* 201* 136*    12: Moderate persistent asthma: continue Dulera 2 puffs BID. Duoneb Nebulizer scheduled BID. Change Tessalon to 100mg  BID PRN. Incentive spirometer ordered   13: Allergic rhinitis: continue Claritin 10mg  QD             -change Flonase to 1 spray each nostril QD prn   14: Hyponatremia: discussed low Na, monitor outpatient.   15: Stress urinary incontinence: placed nursing order to take patient to bathroom q2H during the day to minimize incontinence  16. Hypoalbuminemia: Glucerna ordered. Decreased to daily given elevated blood sugars.   17. Bilateral knee instability: knee sleeves ordered and discussed that they can provide support and stability  18. Left lower extremity swelling: vas [place] ordered and reviewed, is negative for clot, SCDs ordered. Discussed with patient.     >30 minutes spent in discharge of patient including review of medications and follow-up appointments, physical examination, and in answering all patient's questions   LOS: 8 days A FACE TO FACE EVALUATION WAS PERFORMED  Drema Pry Rhen Dossantos 04/24/2023, 9:32 AM

## 2023-04-24 NOTE — Telephone Encounter (Signed)
Patient Advocate Encounter   Received notification that prior authorization for Ozempic (0.25 or 0.5 MG/DOSE) 2MG /3ML pen-injectors is required.   PA submitted on 04/24/2023 Key [id number] Insurance [employer] [facility] Standard Drug Request Form Status is pending       Roland Earl, CPhT Pharmacy Patient Advocate Specialist [facility] Patient Advocate Team Direct Number: [phone]  Fax: [phone]

## 2023-04-25 ENCOUNTER — Telehealth: Payer: Self-pay | Admitting: *Deleted

## 2023-04-25 ENCOUNTER — Other Ambulatory Visit (HOSPITAL_COMMUNITY): Payer: Self-pay

## 2023-04-25 NOTE — Telephone Encounter (Signed)
Transitional Care call--who you talked with Patient    Are you/is patient experiencing any problems since coming home? No Are there any questions regarding any aspect of care?No Are there any questions regarding medications administration/dosing? Yes  Are meds being taken as prescribed? Patient says she has all of her medication except for Insulin  Have there been any falls? No Has Home Health been to the house and/or have they contacted you? D/C to self home If not, have you tried to contact them? N/a Can we help you contact them? N/a Are bowels and bladder emptying properly?Yes  Are there any unexpected incontinence issues? None If applicable, is patient following bowel/bladder programs?n/a Any fevers, problems with breathing, unexpected pain? None Are there any skin problems or new areas of breakdown? None Has the patient/family member arranged specialty MD follow up (ie cardiology/neurology/renal/surgical/etc)? Patient will contact neurology. Can we help arrange? N/a Does the patient need any other services or support that we can help arrange? no Are caregivers following through as expected in assisting the patient? Yes Has the patient quit smoking, drinking alcohol, or using drugs as recommended? Yes. Per patient she does not smoke  Appointment time 9:20  arrive time 9:00  and who it is with here Dr. Carlis Abbott [address] Transitional Care call self

## 2023-04-25 NOTE — Telephone Encounter (Signed)
Patient discharged from hospital yesterday. She needs her insulin Lantus/ Novolog sent to [facility]. TC call made and she says she has all her other medications.

## 2023-04-25 NOTE — Telephone Encounter (Signed)
Patient Advocate Encounter  Received notification that the request for prior authorization for Ozempic (0.25 or 0.5 MG/DOSE) 2MG /3ML pen-injectors  has been denied   Roland Earl, CPhT Pharmacy Patient Advocate Specialist [facility] Patient Advocate Team Direct Number: [phone]  Fax: [phone]

## 2023-05-02 ENCOUNTER — Ambulatory Visit: Payer: Medicaid Other | Attending: Physician Assistant

## 2023-05-02 DIAGNOSIS — R2689 Other abnormalities of gait and mobility: Secondary | ICD-10-CM | POA: Diagnosis present

## 2023-05-02 DIAGNOSIS — R278 Other lack of coordination: Secondary | ICD-10-CM | POA: Diagnosis present

## 2023-05-02 DIAGNOSIS — I6389 Other cerebral infarction: Secondary | ICD-10-CM

## 2023-05-02 DIAGNOSIS — M6281 Muscle weakness (generalized): Secondary | ICD-10-CM | POA: Insufficient documentation

## 2023-05-02 NOTE — Therapy (Signed)
OUTPATIENT PHYSICAL THERAPY NEURO EVALUATION   Patient Name: Latoya Roth MRN: [id number] DOB:May 22, 1971, 52 y.o., female Today's Date: 05/02/2023   PCP: Jackie Plum REFERRING PROVIDER: Mariam Dollar  END OF SESSION:  PT End of Session - 05/02/23 1223     Visit Number 1    Date for PT Re-Evaluation 07/25/23    PT Start Time 1230    PT Stop Time 1315    PT Time Calculation (min) 45 min             Past Medical History:  Diagnosis Date   Anxiety    Arthritis    Asthma    Bell's palsy    BV (bacterial vaginosis)    Depression    Diabetes mellitus    HIV disease (HCC)    Hypertension    Obesity    Past Surgical History:  Procedure Laterality Date   CHOLECYSTECTOMY     Patient Active Problem List   Diagnosis Date Noted   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   Depression 04/12/2023   Moderate persistent asthma 04/12/2023   Allergic rhinitis 04/12/2023   Acute ischemic stroke (HCC) 04/11/2023   Counseled about COVID-19 virus infection 01/25/2020   Screening examination for venereal disease 12/31/2017   Encounter for long-term (current) use of high-risk medication 12/31/2017   Medication monitoring encounter 05/06/2017   DM2 (diabetes mellitus, type 2) (HCC) 04/04/2017   HIV (human immunodeficiency virus infection) (HCC) 03/13/2017   Essential hypertension 04/27/2016   Asthma 04/27/2016   BMI 40.0-44.9, adult (HCC) 01/15/2014    ONSET DATE: 04/11/23  REFERRING DIAG:  Diagnosis  I63.89 (ICD-10-CM) - Other cerebral infarction    THERAPY DIAG:  No diagnosis found.  Rationale for Evaluation and Treatment: Rehabilitation  SUBJECTIVE:                                                                                                                                                                                             SUBJECTIVE STATEMENT: Firs I went to [facility], they sent me home. On the 18th I went [facility] and they did the  same tests and told me I had a stroke. I stayed there 2 weeks. I was doing rehab. In the house sometimes I walk without anything but outside the house I am nervous because my balance is not good.   PERTINENT HISTORY: Latoya Roth is a 52 y.o. female who presented to the ED on 04/11/2023 complaining of right-sided weakness and right-facial droop that started four days prior to presentation. Head CT obtained with strong suspicion for a pontine infarct which was followed by an  MRI that confirmed a subacute left paramedian pons infarct. Neurology was consulted for stroke workup. Does not use tobacco. Drinks about 2 alcoholic servings per day. Medical history significant for hypertension, HIV on HARRT therapy, DM-2. Vitals were relatively stable. Labs showed sodium slightly low at 135. Urine drug screen was negative. Urinalysis was negative as well. EKG showed normal sinus rhythm. Hgb A1c 8.0%. LDL 92. 2D echo 55-60%. Plavix and aspirin started. Continue for 3 weeks then aspirin alone. Diabetic coordinator consulted. Lovenox for DVT prophy. She is requiring mod A to prevent loss of balance. Tolerating carb modified diet.   Latoya Roth was admitted to rehab 04/16/2023 for inpatient therapies to consist of PT, ST and OT at least three hours five days a week. Past admission physiatrist, therapy team and rehab RN have worked together to provide customized collaborative inpatient rehab. She has had improvement in functional use RUE/LUE  and RLE/LLE as well as improvement in awareness. 4/30 . Patient completed stand pivot transfer with supervision and no AD to wheelchair. Propelled herself in w/c ~153ft with supervision using BUE - difficulty maintaining straight path due to RUE weakness. Mod I in room. PT arranged through [facility].   DM, HIV, HTN   PAIN:  Are you having pain? No  PRECAUTIONS: None  WEIGHT BEARING RESTRICTIONS: No  FALLS: Has patient fallen in last 6 months? Yes.  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 No acute hemorrhage or mass effect.  IMPRESSION: No large vessel occlusion or proximal hemodynamically significant stenosis.  COGNITION: Overall cognitive status: Within functional limits for tasks assessed   SENSATION: WFL  POSTURE: rounded shoulders, forward head, increased thoracic kyphosis, and flexed trunk   Increased knee valgus  LOWER EXTREMITY ROM:  grossly WFL    LOWER EXTREMITY MMT:    MMT Right Eval Left Eval  Hip flexion 3+ 5  Hip extension    Hip abduction    Hip adduction    Hip internal rotation    Hip external rotation    Knee flexion 4 5  Knee extension 3+ 4  Ankle dorsiflexion    Ankle plantarflexion    Ankle inversion    Ankle eversion    (Blank rows = not tested)   TRANSFERS: Assistive device utilized: Environmental consultant - 4 wheeled  Sit to stand: Min A Stand to sit: Modified independence   GAIT: Gait pattern: step through pattern, decreased stance time- Right, decreased stride length, genu recurvatum- Right, trendelenburg, and narrow BOS Distance walked: in clinic distances Assistive device utilized: Environmental consultant - 4 wheeled Level of assistance: Modified independence Comments: increased genu valgum on bilateral knees   FUNCTIONAL TESTS:  5 times sit to stand: 33.21s Timed up and go (TUG): 21s Berg Balance Scale: 39/56   TODAY'S TREATMENT:  DATE: 05/02/23    PATIENT EDUCATION: Education details: POC and  HEP Person educated: Patient Education method: Explanation Education comprehension: verbalized understanding  HOME EXERCISE PROGRAM: Access Code: [email or web address] URL: [email or web address] Date: 05/02/2023 Prepared by: Cassie Freer  Exercises - Standing Hip Abduction with Counter Support  - 1 x daily - 7 x weekly - 2 sets - 10 reps - Mini Squat with Counter Support  - 1 x daily - 7 x weekly - 2 sets - 10 reps - Standing March with Counter Support  - 1 x daily - 7 x weekly - 2 sets - 10 reps - Standing Tandem Balance with Counter Support  - 2 x daily - 7 x weekly - 10-30 hold - Standing Single Leg Stance with Counter Support  - 2 x daily - 7 x weekly - 10 hold  GOALS: Goals reviewed with patient? Yes  SHORT TERM GOALS: Target date: 06/13/23  Patient will be independent with initial HEP. Goal status: INITIAL  2.  Patient will demonstrate decreased fall risk by scoring < 15 sec on TUG w/o AD Baseline: 21 with rollator  Goal status: INITIAL   LONG TERM GOALS: Target date: 07/25/23  Patient will be independent with advanced/ongoing HEP to improve outcomes and carryover.  Goal status: INITIAL  2.  Patient will be able to ambulate 500' with LRAD with good safety to access community.  Baseline: using rollator Goal status: INITIAL  3.  Patient will be able to step up/down curb safely with LRAD for safety with community ambulation.  Goal status: INITIAL   4.  Patient will demonstrate improved functional LE strength as demonstrated by 5xSTS <15s. Baseline: 33.21s Goal status: INITIAL  5.  Patient will score 47 on Berg Balance test to demonstrate lower risk of falls. (MCID= 8 points) .  Baseline: 39 Goal status: INITIAL   ASSESSMENT:  CLINICAL IMPRESSION: Patient is a 52 y.o. female who was seen today for physical therapy evaluation and treatment for post stroke. She was in the hospital for 2 weeks and received all therapies. She is currently ambulating with a rollator,  states she has been walking a little in the house without it but does grab on to furniture. Patient presents with some R sided weakness in her legs and decrease balance. She has the most difficulty with tandem and single leg portions of BERG test. She reports that prior to her stroke she was using a cane to get walk, she would like to get back to this or walking without one at all. She will benefit from PT to address her gait and balance impairments to be able to return to PLOF.  OBJECTIVE IMPAIRMENTS: Abnormal gait, decreased balance, difficulty walking, and decreased strength.   REHAB POTENTIAL: Good  CLINICAL DECISION MAKING: Stable/uncomplicated  EVALUATION COMPLEXITY: Low  PLAN:  PT FREQUENCY: 2x/week  PT DURATION: 12 weeks  PLANNED INTERVENTIONS: Therapeutic exercises, Therapeutic activity, Neuromuscular re-education, Balance training, Gait training, Patient/Family education, Self Care, Joint mobilization, Stair training, Dry Needling, Cryotherapy, Moist heat, Ionotophoresis 4mg /ml Dexamethasone, and Manual therapy  PLAN FOR NEXT SESSION: start gym activities to work on LE strengthening and [employer], PT 05/02/2023, 1:16 PM

## 2023-05-03 ENCOUNTER — Encounter: Payer: Self-pay | Admitting: Physical Medicine and Rehabilitation

## 2023-05-03 ENCOUNTER — Encounter
Payer: Medicaid Other | Attending: Physical Medicine and Rehabilitation | Admitting: Physical Medicine and Rehabilitation

## 2023-05-03 ENCOUNTER — Telehealth: Payer: Self-pay | Admitting: *Deleted

## 2023-05-03 VITALS — BP 133/82 | HR 65 | Ht 67.0 in | Wt 255.6 lb

## 2023-05-03 DIAGNOSIS — E119 Type 2 diabetes mellitus without complications: Secondary | ICD-10-CM | POA: Diagnosis not present

## 2023-05-03 DIAGNOSIS — Z6841 Body Mass Index (BMI) 40.0 and over, adult: Secondary | ICD-10-CM | POA: Diagnosis not present

## 2023-05-03 DIAGNOSIS — I639 Cerebral infarction, unspecified: Secondary | ICD-10-CM | POA: Insufficient documentation

## 2023-05-03 DIAGNOSIS — Z794 Long term (current) use of insulin: Secondary | ICD-10-CM | POA: Diagnosis not present

## 2023-05-03 MED ORDER — DEXCOM G7 RECEIVER DEVI: 1.0000 | Freq: Every day | 0 refills | Status: AC

## 2023-05-03 MED ORDER — DEXCOM G7 SENSOR MISC: 1.0000 | Freq: Every day | 0 refills | Status: AC

## 2023-05-03 MED ORDER — OZEMPIC (0.25 OR 0.5 MG/DOSE) 2 MG/3ML ~~LOC~~ SOPN: 0.5000 mg | PEN_INJECTOR | SUBCUTANEOUS | 0 refills | Status: DC

## 2023-05-03 NOTE — Progress Notes (Unsigned)
Subjective:    Patient ID: Latoya Roth, female    DOB: 1971/07/21, 52 y.o.   MRN: [id number]  HPI  Pain Inventory Average Pain 0 Pain Right Now 0 My pain is  no pain  BOWEL Number of stools per week: 3  BLADDER Normal  Mobility walk with assistance use a walker do you drive?  no  Function not employed: date last employed .  Neuro/Psych trouble walking  Prior Studies Any changes since last visit?  no  Physicians involved in your care Any changes since last visit?  no   saw PCP on Wednesday 05/01/23   Family History  Problem Relation Age of Onset   Hypertension Mother    Cancer Son    Social History   Socioeconomic History   Marital status: Legally Separated    Spouse name: Not on file   Number of children: Not on file   Years of education: Not on file   Highest education level: Not on file  Occupational History   Not on file  Tobacco Use   Smoking status: Former   Smokeless tobacco: Never  Substance and Sexual Activity   Alcohol use: Yes    Alcohol/week: 14.0 standard drinks of alcohol    Types: 14 Cans of beer per week    Comment: 2/day   Drug use: No   Sexual activity: Yes    Partners: Male    Birth control/protection: Condom    Comment: accepted condoms  Other Topics Concern   Not on file  Social History Narrative   Not on file   Social Determinants of Health   Financial Resource Strain: Not on file  Food Insecurity: Food Insecurity Present (04/11/2023)   Hunger Vital Sign    Worried About Running Out of Food in the Last Year: Often true    Ran Out of Food in the Last Year: Often true  Transportation Needs: No Transportation Needs (04/11/2023)   PRAPARE - Administrator, Civil Service (Medical): No    Lack of Transportation (Non-Medical): No  Physical Activity: Not on file  Stress: Not on file  Social Connections: Not on file   Past Surgical History:  Procedure Laterality Date   CHOLECYSTECTOMY     Past Medical  History:  Diagnosis Date   Anxiety    Arthritis    Asthma    Bell's palsy    BV (bacterial vaginosis)    Depression    Diabetes mellitus    HIV disease (HCC)    Hypertension    Obesity    BP (!) 155/90   Pulse 65   Ht 5\' 7"  (1.702 m)   Wt 255 lb 9.6 oz (115.9 kg)   SpO2 93%   BMI 40.03 kg/m  BP recheck 133/82 Opioid Risk Score:   Fall Risk Score:  `1  Depression screen [facility] 2/9     05/03/2023    9:19 AM 03/21/2023   11:14 AM 03/22/2022   10:13 AM 08/08/2020   10:09 AM 08/13/2017   11:08 AM 04/04/2017    9:29 AM 03/13/2017    3:24 PM  Depression screen PHQ 2/9  Decreased Interest 2 0 0 0 1 1 3   Down, Depressed, Hopeless 0 0 0 1 1 1 3   PHQ - 2 Score 2 0 0 1 2 2 6   Altered sleeping 2    0 2 2  Tired, decreased energy 0    0 1 1  Change in appetite 0  1 1 1   Feeling bad or failure about yourself  0    0 1 1  Trouble concentrating 0    0 1 2  Moving slowly or fidgety/restless 0    1 1 1   Suicidal thoughts 0    0 0 0  PHQ-9 Score 4    4 9 14   Difficult doing work/chores     Somewhat difficult Somewhat difficult Very difficult     Review of Systems  Constitutional: Negative.   HENT: Negative.    Eyes: Negative.   Respiratory: Negative.    Cardiovascular: Negative.   Gastrointestinal: Negative.   Endocrine: Negative.   Genitourinary: Negative.   Musculoskeletal:  Positive for gait problem.  Skin: Negative.   Hematological: Negative.   Psychiatric/Behavioral:  Positive for dysphoric mood.   All other systems reviewed and are negative.      Objective:   Physical Exam        Assessment & Plan:

## 2023-05-03 NOTE — Telephone Encounter (Signed)
Contact plan to follow up on [id number] Ozempic Contact plan to follow up on [facility] Dexcom sensor Contact plan to follow up on [id number] Dexcom device

## 2023-05-03 NOTE — Patient Instructions (Addendum)
Diabetes: -check CBGs daily, log, and bring log to follow-up appointment -avoid sugar, bread, pasta, rice -avoid snacking -perform daily foot exam and at least annual eye exam -try to incorporate into your diet some of the following foods which are good for diabetes: 1) cinnamon- imitates effects of insulin, increasing glucose transport into cells ([place] or [place] cinnamon is best, least processed) 2) nuts- can slow down the blood sugar response of carbohydrate rich foods 3) oatmeal- contains and anti-inflammatory compound avenanthramide 4) whole-milk yogurt (best types are no sugar, [place] yogurt, or goat/sheep yogurt) 5) beans- high in protein, fiber, and vitamins, low glycemic index 6) broccoli- great source of vitamin A and C 7) quinoa- higher in protein and fiber than other grains 8) spinach- high in vitamin A, fiber, and protein 9) olive oil- reduces glucose levels, LDL, and triglycerides 10) salmon- excellent amount of omega-3-fatty acids 11) walnuts- rich in antioxidants 12) apples- high in fiber and quercetin 13) carrots- highly nutritious with low impact on blood sugar 14) eggs- improve HDL (good cholesterol), high in protein, keep you satiated 15) turmeric: improves blood sugars, cardiovascular disease, and protects kidney health 16) garlic: improves blood sugar, blood pressure, pain 17) tomatoes: highly nutritious with low impact on blood sugar    Obesity: -Educated regarding health benefits of weight loss- for pain, general health, chronic disease prevention, immune health, mental health.  -Will monitor weight every visit.  -Consider [place] tea daily.  -Discussed the benefits of intermittent fasting. -Discussed foods that can assist in weight loss: 1) leafy greens- high in fiber and nutrients 2) dark chocolate- improves metabolism (if prefer sweetened, best to sweeten with honey instead of sugar).  3) cruciferous vegetables- high in fiber and protein 4) full fat  yogurt: high in healthy fat, protein, calcium, and probiotics 5) apples- high in a variety of phytochemicals 6) nuts- high in fiber and protein that increase feelings of fullness 7) grapefruit: rich in nutrients, antioxidants, and fiber (not to be taken with anticoagulation) 8) beans- high in protein and fiber 9) salmon- has high quality protein and healthy fats 10) green tea- rich in polyphenols 11) eggs- rich in choline and vitamin D 12) tuna- high protein, boosts metabolism 13) avocado- decreases visceral abdominal fat 14) chicken (pasture raised): high in protein and iron 15) blueberries- reduce abdominal fat and cholesterol 16) whole grains- decreases calories retained during digestion, speeds metabolism 17) chia seeds- curb appetite 18) chilies- increases fat metabolism  -Discussed supplements that can be used:  1) Metatrim 400mg  BID 30 minutes before breakfast and dinner  2) Sphaeranthus indicus and Garcinia mangostana (combinations of these and #1 can be found in capsicum and zychrome  3) green coffee bean extract 400mg  twice per day or [place] (african mango) 150 to 300mg  twice per day.

## 2023-05-06 NOTE — Telephone Encounter (Signed)
Dexcom device/sensor approved. Faxed to pharmacy.

## 2023-05-07 NOTE — Therapy (Signed)
OUTPATIENT PHYSICAL THERAPY NEURO TREATMENT   Patient Name: [name] MRN: [id number] DOB:[birth year], 52 y.o., female Today's Date: 05/07/2023   PCP: Jackie Plum REFERRING PROVIDER: Mariam Dollar  END OF SESSION:    Past Medical History:  Diagnosis Date   Anxiety    Arthritis    Asthma    Bell's palsy    BV (bacterial vaginosis)    Depression    Diabetes mellitus    HIV disease (HCC)    Hypertension    Obesity    Past Surgical History:  Procedure Laterality Date   CHOLECYSTECTOMY     Patient Active Problem List   Diagnosis Date Noted   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   Depression 04/12/2023   Moderate persistent asthma 04/12/2023   Allergic rhinitis 04/12/2023   Acute ischemic stroke (HCC) 04/11/2023   Counseled about COVID-19 virus infection 01/25/2020   Screening examination for venereal disease 12/31/2017   Encounter for long-term (current) use of high-risk medication 12/31/2017   Medication monitoring encounter 05/06/2017   DM2 (diabetes mellitus, type 2) (HCC) 04/04/2017   HIV (human immunodeficiency virus infection) (HCC) 03/13/2017   Essential hypertension 04/27/2016   Asthma 04/27/2016   BMI 40.0-44.9, adult (HCC) 01/15/2014    ONSET DATE: 04/11/23  REFERRING DIAG:  Diagnosis  I63.89 (ICD-10-CM) - Other cerebral infarction    THERAPY DIAG:  No diagnosis found.  Rationale for Evaluation and Treatment: Rehabilitation  SUBJECTIVE:                                                                                                                                                                                             SUBJECTIVE STATEMENT: I am good, sorry I am late it is raining outside.   PERTINENT HISTORY: Latoya Roth is a 52 y.o. female who presented to the ED on 04/11/2023 complaining of right-sided weakness and right-facial droop that started four days prior to presentation. Head CT obtained with strong  suspicion for a pontine infarct which was followed by an MRI that confirmed a subacute left paramedian pons infarct. Neurology was consulted for stroke workup. Does not use tobacco. Drinks about 2 alcoholic servings per day. Medical history significant for hypertension, HIV on HARRT therapy, DM-2. Vitals were relatively stable. Labs showed sodium slightly low at 135. Urine drug screen was negative. Urinalysis was negative as well. EKG showed normal sinus rhythm. Hgb A1c 8.0%. LDL 92. 2D echo 55-60%. Plavix and aspirin started. Continue for 3 weeks then aspirin alone. Diabetic coordinator consulted. Lovenox for DVT prophy. She is requiring mod A to prevent loss of balance. Tolerating carb modified diet.  [name] was admitted to rehab 04/16/2023 for inpatient therapies to consist of PT, ST and OT at least three hours five days a week. Past admission physiatrist, therapy team and rehab RN have worked together to provide customized collaborative inpatient rehab. She has had improvement in functional use RUE/LUE  and RLE/LLE as well as improvement in awareness. 4/30 . Patient completed stand pivot transfer with supervision and no AD to wheelchair. Propelled herself in w/c ~176ft with supervision using BUE - difficulty maintaining straight path due to RUE weakness. Mod I in room. PT arranged [facility].   DM, HIV, HTN   PAIN:  Are you having pain? No  PRECAUTIONS: None  WEIGHT BEARING RESTRICTIONS: No  FALLS: Has patient fallen in last 6 months? Yes. 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 No  acute hemorrhage or mass effect.  IMPRESSION: No large vessel occlusion or proximal hemodynamically significant stenosis.  COGNITION: Overall cognitive status: Within functional limits for tasks assessed   SENSATION: WFL  POSTURE: rounded shoulders, forward head, increased thoracic kyphosis, and flexed trunk   Increased knee valgus  LOWER EXTREMITY ROM:  grossly WFL    LOWER EXTREMITY MMT:    MMT Right Eval Left Eval  Hip flexion 3+ 5  Hip extension    Hip abduction    Hip adduction    Hip internal rotation    Hip external rotation    Knee flexion 4 5  Knee extension 3+ 4  Ankle dorsiflexion    Ankle plantarflexion    Ankle inversion    Ankle eversion    (Blank rows = not tested)   TRANSFERS: Assistive device utilized: Environmental consultant - 4 wheeled  Sit to stand: Min A Stand to sit: Modified independence   GAIT: Gait pattern: step through pattern, decreased stance time- Right, decreased stride length, genu recurvatum- Right, trendelenburg, and narrow BOS Distance walked: in clinic distances Assistive device utilized: Environmental consultant - 4 wheeled Level of assistance: Modified independence Comments: increased genu valgum on bilateral knees   FUNCTIONAL TESTS:  5 times sit to stand: 33.21s Timed up and go (TUG): 21s Berg Balance Scale: 39/56   TODAY'S TREATMENT:                                                                                                                              DATE:  05/08/23 LAQ 2# 2x10 Marching 2# 20 reps alt x2 HS curls green 2x10  STS with red ball chest press 2x10  NuStep L4 x14mins  Walking without AD 1 lap in gym  Step ups  4" at stairs  Standing on airex feet together 25s at best, then eyes closed 10s in // bars  Step up on airex in // bars holding on x5 each side    05/02/23- EVAL   PATIENT EDUCATION: Education details: POC and HEP Person educated: Patient Education method: Explanation Education comprehension: verbalized  understanding  HOME EXERCISE PROGRAM: Access Code: [email or web address] URL: [email or web address] Date: 05/02/2023 Prepared by: Cassie Freer  Exercises - Standing Hip Abduction with Counter Support  - 1 x daily - 7 x weekly - 2 sets - 10 reps - Mini Squat with Counter Support  - 1 x daily - 7 x weekly - 2 sets - 10 reps - Standing March with Counter Support  - 1 x daily - 7 x weekly - 2 sets - 10 reps - Standing Tandem Balance with Counter Support  - 2 x daily - 7 x weekly - 10-30 hold - Standing Single Leg Stance with Counter Support  - 2 x daily - 7 x weekly - 10 hold  GOALS: Goals reviewed with patient? Yes  SHORT TERM GOALS: Target date: 06/13/23  Patient will be independent with initial HEP. Goal status: INITIAL  2.  Patient will demonstrate decreased fall risk by scoring < 15 sec on TUG w/o AD Baseline: 21 with rollator  Goal status: INITIAL   LONG TERM GOALS: Target date: 07/25/23  Patient will be independent with advanced/ongoing HEP to improve outcomes and carryover.  Goal status: INITIAL  2.  Patient will be able to ambulate 500' with LRAD with good safety to access community.  Baseline: using rollator Goal status: INITIAL  3.  Patient will be able to step up/down curb safely with LRAD for safety with community ambulation.  Goal status: INITIAL   4.  Patient will demonstrate improved functional LE strength as demonstrated by 5xSTS <15s. Baseline: 33.21s Goal status: INITIAL  5.  Patient will score 47 on Berg Balance test to demonstrate lower risk of falls. (MCID= 8 points) .  Baseline: 39 Goal status: INITIAL   ASSESSMENT:  CLINICAL IMPRESSION: Patient is a 52 y.o. female who was seen today for physical therapy treatment for post stroke. We worked on some low level strength and balance training today. She requested we try some walking today, she is able to ambulate 1 lap in the gym with CGA. She tends to walk on the lateral sides of her feet, very  supinated. Some difficulty with STS, unable to do from low table. Some wheezing noted throughout session, she reports she needs a nebulizer.  She will benefit from PT to address her gait and balance impairments to be able to return to PLOF.   OBJECTIVE IMPAIRMENTS: Abnormal gait, decreased balance, difficulty walking, and decreased strength.   REHAB POTENTIAL: Good  CLINICAL DECISION MAKING: Stable/uncomplicated  EVALUATION COMPLEXITY: Low  PLAN:  PT FREQUENCY: 2x/week  PT DURATION: 12 weeks  PLANNED INTERVENTIONS: Therapeutic exercises, Therapeutic activity, Neuromuscular re-education, Balance training, Gait training, Patient/Family education, Self Care, Joint mobilization, Stair training, Dry Needling, Cryotherapy, Moist heat, Ionotophoresis 4mg /ml Dexamethasone, and Manual therapy  PLAN FOR NEXT SESSION: start gym activities to work on LE strengthening and [employer], PT 05/07/2023, 6:22 PM

## 2023-05-07 NOTE — Telephone Encounter (Signed)
Received fax that Ozempic Rx was denied by [facility]

## 2023-05-08 ENCOUNTER — Ambulatory Visit: Payer: Medicaid Other

## 2023-05-08 DIAGNOSIS — R278 Other lack of coordination: Secondary | ICD-10-CM

## 2023-05-08 DIAGNOSIS — I6389 Other cerebral infarction: Secondary | ICD-10-CM

## 2023-05-08 DIAGNOSIS — R2689 Other abnormalities of gait and mobility: Secondary | ICD-10-CM

## 2023-05-08 DIAGNOSIS — M6281 Muscle weakness (generalized): Secondary | ICD-10-CM

## 2023-05-10 ENCOUNTER — Ambulatory Visit: Payer: Medicaid Other

## 2023-05-13 ENCOUNTER — Telehealth: Payer: Self-pay | Admitting: *Deleted

## 2023-05-13 NOTE — Telephone Encounter (Signed)
Patient calling to inquire about a refill then determine it was a 7 day medication then d/c. Also had questions about telephone visit scheduled for 5/21. Pt is not going to used Dexcom as she was not aware of what it was.

## 2023-05-14 ENCOUNTER — Encounter (HOSPITAL_BASED_OUTPATIENT_CLINIC_OR_DEPARTMENT_OTHER): Payer: Medicaid Other | Admitting: Physical Medicine and Rehabilitation

## 2023-05-14 DIAGNOSIS — I639 Cerebral infarction, unspecified: Secondary | ICD-10-CM | POA: Diagnosis not present

## 2023-05-14 MED ORDER — TOPIRAMATE 25 MG PO TABS
25.00 mg | ORAL_TABLET | Freq: Every evening | ORAL | 3 refills | Status: DC
Start: 2023-05-14 — End: 2023-07-02

## 2023-05-14 MED ORDER — VITAMIN D (ERGOCALCIFEROL) 1.25 MG (50000 UNIT) PO CAPS: 50000.0000 [IU] | ORAL_CAPSULE | ORAL | 0 refills | Status: DC

## 2023-05-14 NOTE — Therapy (Signed)
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   Depression 04/12/2023   Moderate persistent asthma 04/12/2023   Allergic rhinitis 04/12/2023   Acute ischemic stroke (HCC) 04/11/2023   Counseled about COVID-19 virus infection 01/25/2020   Screening examination for venereal disease 12/31/2017   Encounter for long-term (current) use of high-risk medication 12/31/2017   Medication monitoring encounter 05/06/2017   DM2 (diabetes mellitus, type 2) (HCC) 04/04/2017   HIV (human immunodeficiency virus infection) (HCC) 03/13/2017   Essential hypertension 04/27/2016   Asthma 04/27/2016   BMI 40.0-44.9, adult (HCC) 01/15/2014    ONSET DATE: 04/11/23  REFERRING DIAG:  Diagnosis  I63.89 (ICD-10-CM) - Other cerebral infarction    THERAPY DIAG:  No diagnosis found.  Rationale for Evaluation and Treatment: Rehabilitation  SUBJECTIVE:                                                                                                                                                                                             SUBJECTIVE STATEMENT: My knees are hurting today.   PERTINENT HISTORY: Latoya Roth is a 52 y.o. female who presented to the ED on 04/11/2023 complaining of right-sided weakness and right-facial droop that started four days prior to presentation. Head CT obtained with strong suspicion for a pontine  infarct which was followed by an MRI that confirmed a subacute left paramedian pons infarct. Neurology was consulted for stroke workup. Does not use tobacco. Drinks about 2 alcoholic servings per day. Medical history significant for hypertension, HIV on HARRT therapy, DM-2. Vitals were relatively stable. Labs showed sodium slightly low at 135. Urine drug screen was negative. Urinalysis was negative as well. EKG showed normal sinus rhythm. Hgb A1c 8.0%. LDL 92. 2D echo 55-60%. Plavix and aspirin started. Continue for 3 weeks then aspirin alone. Diabetic coordinator consulted. Lovenox for DVT prophy. She is requiring mod A to prevent loss of balance. Tolerating carb modified diet.   Latoya Roth was admitted to  rehab 04/16/2023 for inpatient therapies to consist of PT, ST and OT at least three hours five days a week. Past admission physiatrist, therapy team and rehab RN have worked together to provide customized collaborative inpatient rehab. She has had improvement in functional use RUE/LUE  and RLE/LLE as well as improvement in awareness. 4/30 . Patient completed stand pivot transfer with supervision and no AD to wheelchair. Propelled herself in w/c ~155ft with supervision using BUE - difficulty maintaining straight path due to RUE weakness. Mod I in room. PT arranged through [facility].   DM, HIV, HTN   PAIN:  Are you having pain? No  PRECAUTIONS: None  WEIGHT BEARING RESTRICTIONS: No  FALLS: Has patient fallen in last 6 months? Yes. 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 No acute hemorrhage or mass  effect.  IMPRESSION: No large vessel occlusion or proximal hemodynamically significant stenosis.  COGNITION: Overall cognitive status: Within functional limits for tasks assessed   SENSATION: WFL  POSTURE: rounded shoulders, forward head, increased thoracic kyphosis, and flexed trunk   Increased knee valgus  LOWER EXTREMITY ROM:  grossly WFL    LOWER EXTREMITY MMT:    MMT Right Eval Left Eval  Hip flexion 3+ 5  Hip extension    Hip abduction    Hip adduction    Hip internal rotation    Hip external rotation    Knee flexion 4 5  Knee extension 3+ 4  Ankle dorsiflexion    Ankle plantarflexion    Ankle inversion    Ankle eversion    (Blank rows = not tested)   TRANSFERS: Assistive device utilized: Environmental consultant - 4 wheeled  Sit to stand: Min A Stand to sit: Modified independence   GAIT: Gait pattern: step through pattern, decreased stance time- Right, decreased stride length, genu recurvatum- Right, trendelenburg, and narrow BOS Distance walked: in clinic distances Assistive device utilized: Environmental consultant - 4 wheeled Level of assistance: Modified independence Comments: increased genu valgum on bilateral knees   FUNCTIONAL TESTS:  5 times sit to stand: 33.21s Timed up and go (TUG): 21s Berg Balance Scale: 39/56   TODAY'S TREATMENT:                                                                                                                              DATE:  05/15/23 Walking 3 laps w/o AD  STS on airex from elevated mat table x10, pain in knees - CGA Leg ext 10# 2x10, RLE x5 HS curls 20# 2x10, RLE x10 3# marching 20 reps minA  3# hip abd 2x10  Leg press 20# 2x10  NuStep L5 x51mins    05/08/23 LAQ 2# 2x10 Marching 2# 20 reps alt x2 HS curls green 2x10  STS with red ball chest press 2x10  NuStep L4 x79mins  Walking without AD 1 lap in gym  Step ups 4" at stairs  Standing on airex feet together 25s at best, then eyes closed 10s in // bars  Step up on airex in  // bars holding on x5 each side    05/02/23- EVAL   PATIENT EDUCATION: Education details: POC and HEP Person educated: Patient Education method: Explanation Education comprehension: verbalized understanding  HOME EXERCISE PROGRAM: Access Code: [email or web address] URL: [email or web address] Date: 05/02/2023 Prepared by: Cassie Freer  Exercises - Standing Hip Abduction with Counter Support  - 1 x daily - 7 x weekly - 2 sets - 10 reps - Mini Squat with Counter Support  - 1 x daily - 7 x weekly - 2 sets - 10 reps - Standing March with Counter Support  - 1 x daily - 7 x weekly - 2 sets - 10 reps - Standing Tandem Balance with Counter Support  - 2 x daily - 7 x weekly - 10-30 hold - Standing Single Leg Stance with Counter Support  - 2 x daily - 7 x weekly - 10 hold  GOALS: Goals reviewed with patient? Yes  SHORT TERM GOALS: Target date: 06/13/23  Patient will be independent with initial HEP. Goal status: INITIAL  2.  Patient will demonstrate decreased fall risk by scoring < 15 sec on TUG w/o AD Baseline: 21 with rollator  Goal status: INITIAL   LONG TERM GOALS: Target date: 07/25/23  Patient will be independent with advanced/ongoing HEP to improve outcomes and carryover.  Goal status: INITIAL  2.  Patient will be able to ambulate 500' with LRAD with good safety to access community.  Baseline: using rollator Goal status: INITIAL  3.  Patient will be able to step up/down curb safely with LRAD for safety with community ambulation.  Goal status: INITIAL   4.  Patient will demonstrate improved functional LE strength as demonstrated by 5xSTS <15s. Baseline: 33.21s Goal status: INITIAL  5.  Patient will score 47 on Berg Balance test to demonstrate lower risk of falls. (MCID= 8 points) .  Baseline: 39 Goal status: INITIAL   ASSESSMENT:  CLINICAL IMPRESSION: Patient is a 52 y.o. female who was seen today for physical therapy treatment for post stroke. We worked on some  progressive strengthening and balance training today. Was able to get her on the machines today, does well but needs cues to use RLE. Did some isolated RLE strengthening. Was able to walk 3 laps around gym without cane, CGA and better pace. Fatigue towards end of session.    OBJECTIVE IMPAIRMENTS: Abnormal gait, decreased balance, difficulty walking, and decreased strength.   REHAB POTENTIAL: Good  CLINICAL DECISION MAKING: Stable/uncomplicated  EVALUATION COMPLEXITY: Low  PLAN:  PT FREQUENCY: 2x/week  PT DURATION: 12 weeks  PLANNED INTERVENTIONS: Therapeutic exercises, Therapeutic activity, Neuromuscular re-education, Balance training, Gait training, Patient/Family education, Self Care, Joint mobilization, Stair training, Dry Needling, Cryotherapy, Moist heat, Ionotophoresis 4mg /ml Dexamethasone, and Manual therapy  PLAN FOR NEXT SESSION: continue to work on LE strengthening and balance, step ups, walking on beam    [employer], PT 05/14/2023, 1:10 PM

## 2023-05-14 NOTE — Progress Notes (Signed)
Subjective:    Patient ID: Latoya Roth, female    DOB: May 30, 1971, 52 y.o.   MRN: [id number]  HPI An audio/video tele-health visit is felt to be the most appropriate encounter for this patient at this time. This is a follow up tele-visit via phone. The patient is at home. MD is at office. Prior to scheduling this appointment, our staff discussed the limitations of evaluation and management by telemedicine and the availability of in-person appointments. The patient expressed understanding and agreed to proceed.   1) Type 2 DM -patient does not know how sugars have been running as she has not been checking them -she has been taking short acting insulin -she would like Dexcom  2) HTN -BP is elevated  3) CVA -she has been receiving therapy and improving -continues to use the RW -she asks if she needs any more clopidogrel  4) Obesity: -she would like to try topamax  Pain Inventory Average Pain 0 Pain Right Now 0 My pain is  no pain  BOWEL Number of stools per week: 3  BLADDER Normal  Mobility walk with assistance use a walker do you drive?  no  Function not employed: date last employed .  Neuro/Psych trouble walking  Prior Studies Any changes since last visit?  no  Physicians involved in your care Any changes since last visit?  no   saw PCP on Wednesday 05/01/23   Family History  Problem Relation Age of Onset   Hypertension Mother    Cancer Son    Social History   Socioeconomic History   Marital status: Legally Separated    Spouse name: Not on file   Number of children: Not on file   Years of education: Not on file   Highest education level: Not on file  Occupational History   Not on file  Tobacco Use   Smoking status: Former   Smokeless tobacco: Never  Substance and Sexual Activity   Alcohol use: Yes    Alcohol/week: 14.0 standard drinks of alcohol    Types: 14 Cans of beer per week    Comment: 2/day   Drug use: No   Sexual activity: Yes     Partners: Male    Birth control/protection: Condom    Comment: accepted condoms  Other Topics Concern   Not on file  Social History Narrative   Not on file   Social Determinants of Health   Financial Resource Strain: Not on file  Food Insecurity: Food Insecurity Present (04/11/2023)   Hunger Vital Sign    Worried About Running Out of Food in the Last Year: Often true    Ran Out of Food in the Last Year: Often true  Transportation Needs: No Transportation Needs (04/11/2023)   PRAPARE - Administrator, Civil Service (Medical): No    Lack of Transportation (Non-Medical): No  Physical Activity: Not on file  Stress: Not on file  Social Connections: Not on file   Past Surgical History:  Procedure Laterality Date   CHOLECYSTECTOMY     Past Medical History:  Diagnosis Date   Anxiety    Arthritis    Asthma    Bell's palsy    BV (bacterial vaginosis)    Depression    Diabetes mellitus    HIV disease (HCC)    Hypertension    Obesity    There were no vitals taken for this visit. BP recheck 133/82 Opioid Risk Score:   Fall Risk Score:  `1  Depression screen [facility] 2/9     05/03/2023    9:19 AM 03/21/2023   11:14 AM 03/22/2022   10:13 AM 08/08/2020   10:09 AM 08/13/2017   11:08 AM 04/04/2017    9:29 AM 03/13/2017    3:24 PM  Depression screen PHQ 2/9  Decreased Interest 2 0 0 0 1 1 3   Down, Depressed, Hopeless 0 0 0 1 1 1 3   PHQ - 2 Score 2 0 0 1 2 2 6   Altered sleeping 2    0 2 2  Tired, decreased energy 0    0 1 1  Change in appetite 0    1 1 1   Feeling bad or failure about yourself  0    0 1 1  Trouble concentrating 0    0 1 2  Moving slowly or fidgety/restless 0    1 1 1   Suicidal thoughts 0    0 0 0  PHQ-9 Score 4    4 9 14   Difficult doing work/chores     Somewhat difficult Somewhat difficult Very difficult     Review of Systems  Constitutional: Negative.   HENT: Negative.    Eyes: Negative.   Respiratory: Negative.    Cardiovascular: Negative.    Gastrointestinal: Negative.   Endocrine: Negative.   Genitourinary: Negative.   Musculoskeletal:  Positive for gait problem.  Skin: Negative.   Hematological: Negative.   Psychiatric/Behavioral:  Positive for dysphoric mood.   All other systems reviewed and are negative.      Objective:   Physical Exam Gen: no distress, normal appearing HEENT: oral mucosa pink and moist, NCAT Cardio: Reg rate Chest: normal effort, normal rate of breathing Abd: soft, non-distended Ext: no edema Psych: pleasant, normal affect Skin: intact Neuro: Alert and oriented x3 MSK: ambulating with RW     Assessment & Plan:   1) Diabetes: -Dexcom ordered -continue insulin -Wegovy prescribed -avoid sugar, bread, pasta, rice -avoid snacking -perform daily foot exam and at least annual eye exam -try to incorporate into your diet some of the following foods which are good for diabetes: 1) cinnamon- imitates effects of insulin, increasing glucose transport into cells ([place] or [place] cinnamon is best, least processed) 2) nuts- can slow down the blood sugar response of carbohydrate rich foods 3) oatmeal- contains and anti-inflammatory compound avenanthramide 4) whole-milk yogurt (best types are no sugar, [place] yogurt, or goat/sheep yogurt) 5) beans- high in protein, fiber, and vitamins, low glycemic index 6) broccoli- great source of vitamin A and C 7) quinoa- higher in protein and fiber than other grains 8) spinach- high in vitamin A, fiber, and protein 9) olive oil- reduces glucose levels, LDL, and triglycerides 10) salmon- excellent amount of omega-3-fatty acids 11) walnuts- rich in antioxidants 12) apples- high in fiber and quercetin 13) carrots- highly nutritious with low impact on blood sugar 14) eggs- improve HDL (good cholesterol), high in protein, keep you satiated 15) turmeric: improves blood sugars, cardiovascular disease, and protects kidney health 16) garlic: improves blood sugar,  blood pressure, pain 17) tomatoes: highly nutritious with low impact on blood sugar   2) HTN: -BP is 155/90 today.  -Advised checking BP daily at home and logging results to bring into follow-up appointment with PCP and myself. -Reviewed BP meds today.  -Advised regarding healthy foods that can help lower blood pressure and provided with a list: 1) citrus foods- high in vitamins and minerals 2) salmon and other fatty fish - reduces inflammation and oxylipins  3) swiss chard (leafy green)- high level of nitrates 4) pumpkin seeds- one of the best natural sources of magnesium 5) Beans and lentils- high in fiber, magnesium, and potassium 6) Berries- high in flavonoids 7) Amaranth (whole grain, can be cooked similarly to rice and oats)- high in magnesium and fiber 8) Pistachios- even more effective at reducing BP than other nuts 9) Carrots- high in phenolic compounds that relax blood vessels and reduce inflammation 10) Celery- contain phthalides that relax tissues of arterial walls 11) Tomatoes- can also improve cholesterol and reduce risk of heart disease 12) Broccoli- good source of magnesium, calcium, and potassium 13) Greek yogurt: high in potassium and calcium 14) Herbs and spices: Celery seed, cilantro, saffron, lemongrass, black cumin, ginseng, cinnamon, cardamom, sweet basil, and ginger 15) Chia and flax seeds- also help to lower cholesterol and blood sugar 16) Beets- high levels of nitrates that relax blood vessels  17) spinach and bananas- high in potassium  -Provided lise of supplements that can help with hypertension:  1) magnesium: one high quality brand is Bioptemizers since it contains all 7 types of magnesium, otherwise over the counter magnesium gluconate 400mg  is a good option 2) B vitamins 3) vitamin D 4) potassium 5) CoQ10 6) L-arginine 7) Vitamin C 8) Beetroot -Educated that goal BP is 120/80. -Made goal to incorporate some of the above foods into diet.    3)  CVA -continue therapies -continue RW -discussed stopping Plavix  Patient will require OT and PT; orders have been placed  The patient's medical and/or psychosocial problems require moderate decision-making during transitions in care from inpatient rehabilitation to home.  This transitional care appointment included review of the patient's hospital discharge summary, review of the patient's hospital diagnostic tests and discussion of appropriate follow-up, education of the patient regarding their condition, re-establishment of necessary referrals. I will be reviewing patient's home and/or outpatient therapy notes as they progress through therapy and corresponding with therapists accordingly. I have encouraged compliance with current medication regimen (with adjustment to regimen as needed), follow-up with necessary providers, and the importance of following a healthy diet and exercise routine to maximize recovery, health, and quality of life.    4) Obesity: -prescribed topamax 25mg  HS  8 minutes spent discussing that she can stop plavix as she has completed 3 week course, discussion of topamax for weight loss

## 2023-05-15 ENCOUNTER — Ambulatory Visit: Payer: Medicaid Other

## 2023-05-15 DIAGNOSIS — M6281 Muscle weakness (generalized): Secondary | ICD-10-CM

## 2023-05-15 DIAGNOSIS — R278 Other lack of coordination: Secondary | ICD-10-CM

## 2023-05-15 DIAGNOSIS — I6389 Other cerebral infarction: Secondary | ICD-10-CM

## 2023-05-15 DIAGNOSIS — R2689 Other abnormalities of gait and mobility: Secondary | ICD-10-CM

## 2023-05-15 NOTE — Therapy (Signed)
OUTPATIENT PHYSICAL THERAPY NEURO TREATMENT   Patient Name: Latoya Roth MRN: [id number] DOB:11/11/1971, 52 y.o., female Today's Date: 05/16/2023   PCP: Jackie Plum REFERRING PROVIDER: Mariam Dollar  END OF SESSION:  PT End of Session - 05/16/23 1225     Visit Number 4    Date for PT Re-Evaluation 07/25/23    PT Start Time 1225    PT Stop Time 1310    PT Time Calculation (min) 45 min              Past Medical History:  Diagnosis Date   Anxiety    Arthritis    Asthma    Bell's palsy    BV (bacterial vaginosis)    Depression    Diabetes mellitus    HIV disease (HCC)    Hypertension    Obesity    Past Surgical History:  Procedure Laterality Date   CHOLECYSTECTOMY     Patient Active Problem List   Diagnosis Date Noted   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   Depression 04/12/2023   Moderate persistent asthma 04/12/2023   Allergic rhinitis 04/12/2023   Acute ischemic stroke (HCC) 04/11/2023   Counseled about COVID-19 virus infection 01/25/2020   Screening examination for venereal disease 12/31/2017   Encounter for long-term (current) use of high-risk medication 12/31/2017   Medication monitoring encounter 05/06/2017   DM2 (diabetes mellitus, type 2) (HCC) 04/04/2017   HIV (human immunodeficiency virus infection) (HCC) 03/13/2017   Essential hypertension 04/27/2016   Asthma 04/27/2016   BMI 40.0-44.9, adult (HCC) 01/15/2014    ONSET DATE: 04/11/23  REFERRING DIAG:  Diagnosis  I63.89 (ICD-10-CM) - Other cerebral infarction    THERAPY DIAG:  Acute brainstem infarction (HCC)  Muscle weakness (generalized)  Other abnormalities of gait and mobility  Other lack of coordination  Rationale for Evaluation and Treatment: Rehabilitation  SUBJECTIVE:                                                                                                                                                                                              SUBJECTIVE STATEMENT: I am okay  PERTINENT HISTORY: Latoya Roth is a 52 y.o. female who presented to the ED on 04/11/2023 complaining of right-sided weakness and right-facial droop that started four days prior to presentation. Head CT obtained with strong suspicion for a pontine infarct which was followed by an MRI that confirmed a subacute left paramedian pons infarct. Neurology was consulted for stroke workup. Does not use tobacco. Drinks about 2 alcoholic servings per day. Medical history significant for hypertension, HIV on HARRT therapy, DM-2. Vitals were relatively stable.  Labs showed sodium slightly low at 135. Urine drug screen was negative. Urinalysis was negative as well. EKG showed normal sinus rhythm. Hgb A1c 8.0%. LDL 92. 2D echo 55-60%. Plavix and aspirin started. Continue for 3 weeks then aspirin alone. Diabetic coordinator consulted. Lovenox for DVT prophy. She is requiring mod A to prevent loss of balance. Tolerating carb modified diet.   HAROLD HOYT was admitted to rehab 04/16/2023 for inpatient therapies to consist of PT, ST and OT at least three hours five days a week. Past admission physiatrist, therapy team and rehab RN have worked together to provide customized collaborative inpatient rehab. She has had improvement in functional use RUE/LUE  and RLE/LLE as well as improvement in awareness. 4/30 . Patient completed stand pivot transfer with supervision and no AD to wheelchair. Propelled herself in w/c ~191ft with supervision using BUE - difficulty maintaining straight path due to RUE weakness. Mod I in room. PT arranged through [facility].   DM, HIV, HTN   PAIN:  Are you having pain? No  PRECAUTIONS: None  WEIGHT BEARING RESTRICTIONS: No  FALLS: Has patient fallen in last 6 months? Yes. 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 No acute hemorrhage or mass effect.  IMPRESSION: No large vessel occlusion or proximal hemodynamically significant stenosis.  COGNITION: Overall cognitive status: Within functional limits for tasks assessed   SENSATION: WFL  POSTURE: rounded shoulders, forward head, increased thoracic kyphosis, and flexed trunk   Increased knee valgus  LOWER EXTREMITY ROM:  grossly WFL    LOWER EXTREMITY MMT:    MMT Right Eval Left Eval  Hip flexion 3+ 5  Hip extension    Hip abduction    Hip adduction    Hip internal rotation    Hip external rotation    Knee flexion 4 5  Knee extension 3+ 4  Ankle dorsiflexion    Ankle plantarflexion    Ankle inversion    Ankle eversion    (Blank rows = not tested)   TRANSFERS: Assistive device utilized: Environmental consultant - 4 wheeled  Sit to stand: Min A Stand to sit: Modified independence   GAIT: Gait pattern: step through pattern, decreased stance time- Right, decreased stride length, genu recurvatum- Right, trendelenburg, and narrow BOS Distance walked: in clinic distances Assistive device utilized: Environmental consultant - 4 wheeled Level of assistance: Modified independence Comments: increased genu valgum on bilateral knees   FUNCTIONAL TESTS:  5 times sit to stand: 33.21s Timed up and go (TUG): 21s Berg Balance Scale: 39/56   TODAY'S TREATMENT:  DATE:  05/16/23 NuStep L5 x46mins Calf stretch 30s  Calf raises 2x10  Step ups 6" x5 each side, fatigue and difficulty, tried on 4" instead able to do x10 with fatigue Walking on beam in II bars Tandem standing 10s  Walking laps  without AD 2 big laps x2 with rest break in between  ankleTB green 20 reps all ways both ankles   05/15/23 Walking 3 laps w/o AD  STS on airex from elevated mat table x10, pain in knees - CGA Leg ext 10# 2x10, RLE x5 HS curls 20# 2x10, RLE x10 3# marching 20 reps minA  3# hip abd 2x10  Leg press 20# 2x10  NuStep L5 x26mins    05/08/23 LAQ 2# 2x10 Marching 2# 20 reps alt x2 HS curls green 2x10  STS with red ball chest press 2x10  NuStep L4 x31mins  Walking without AD 1 lap in gym  Step ups 4" at stairs  Standing on airex feet together 25s at best, then eyes closed 10s in // bars  Step up on airex in // bars holding on x5 each side    05/02/23- EVAL   PATIENT EDUCATION: Education details: POC and HEP Person educated: Patient Education method: Explanation Education comprehension: verbalized understanding  HOME EXERCISE PROGRAM: Access Code: [email or web address] URL: [email or web address] Date: 05/02/2023 Prepared by: Cassie Freer  Exercises - Standing Hip Abduction with Counter Support  - 1 x daily - 7 x weekly - 2 sets - 10 reps - Mini Squat with Counter Support  - 1 x daily - 7 x weekly - 2 sets - 10 reps - Standing March with Counter Support  - 1 x daily - 7 x weekly - 2 sets - 10 reps - Standing Tandem Balance with Counter Support  - 2 x daily - 7 x weekly - 10-30 hold - Standing Single Leg Stance with Counter Support  - 2 x daily - 7 x weekly - 10 hold  GOALS: Goals reviewed with patient? Yes  SHORT TERM GOALS: Target date: 06/13/23  Patient will be independent with initial HEP. Goal status: INITIAL  2.  Patient will demonstrate decreased fall risk by scoring < 15 sec on TUG w/o AD Baseline: 21 with rollator  Goal status: INITIAL   LONG TERM GOALS: Target date: 07/25/23  Patient will be independent with advanced/ongoing HEP to improve outcomes and carryover.  Goal status: INITIAL  2.  Patient will be able to ambulate 500' with LRAD with good safety to  access community.  Baseline: using rollator Goal status: INITIAL  3.  Patient will be able to step up/down curb safely with LRAD for safety with community ambulation.  Goal status: INITIAL   4.  Patient will demonstrate improved functional LE strength as demonstrated by 5xSTS <15s. Baseline: 33.21s Goal status: INITIAL  5.  Patient will score 47 on Berg Balance test to demonstrate lower risk of falls. (MCID= 8 points) .  Baseline: 39 Goal status: INITIAL   ASSESSMENT:  CLINICAL IMPRESSION: Patient was seen today for physical therapy treatment for post stroke, weakness, and balance. We worked on some functional strengthening and balance training today. Fatigue present with step ups, pushes off with UE more when she begins to get tired. Struggles a lot with balance on beam, unable to do any steps without holding on to bars and does get a little frustrated. minA with walking esp when she starts to get tired, decreased foot clearance on R foot.    OBJECTIVE IMPAIRMENTS: Abnormal gait,  decreased balance, difficulty walking, and decreased strength.   REHAB POTENTIAL: Good  CLINICAL DECISION MAKING: Stable/uncomplicated  EVALUATION COMPLEXITY: Low  PLAN:  PT FREQUENCY: 2x/week  PT DURATION: 12 weeks  PLANNED INTERVENTIONS: Therapeutic exercises, Therapeutic activity, Neuromuscular re-education, Balance training, Gait training, Patient/Family education, Self Care, Joint mobilization, Stair training, Dry Needling, Cryotherapy, Moist heat, Ionotophoresis 4mg /ml Dexamethasone, and Manual therapy  PLAN FOR NEXT SESSION: continue to work on LE strengthening and [employer], PT 05/16/2023, 1:11 PM

## 2023-05-16 ENCOUNTER — Ambulatory Visit: Payer: Medicaid Other

## 2023-05-16 DIAGNOSIS — M6281 Muscle weakness (generalized): Secondary | ICD-10-CM

## 2023-05-16 DIAGNOSIS — R278 Other lack of coordination: Secondary | ICD-10-CM

## 2023-05-16 DIAGNOSIS — I6389 Other cerebral infarction: Secondary | ICD-10-CM | POA: Diagnosis not present

## 2023-05-16 DIAGNOSIS — R2689 Other abnormalities of gait and mobility: Secondary | ICD-10-CM

## 2023-05-17 ENCOUNTER — Ambulatory Visit: Payer: Medicaid Other

## 2023-05-21 NOTE — Therapy (Signed)
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   Depression 04/12/2023   Moderate persistent asthma 04/12/2023   Allergic rhinitis 04/12/2023   Acute ischemic stroke (HCC) 04/11/2023   Counseled about COVID-19 virus infection 01/25/2020   Screening examination for venereal disease 12/31/2017   Encounter for long-term (current) use of high-risk medication 12/31/2017   Medication monitoring encounter 05/06/2017   DM2 (diabetes mellitus, type 2) (HCC) 04/04/2017   HIV (human immunodeficiency virus infection) (HCC) 03/13/2017   Essential hypertension 04/27/2016   Asthma 04/27/2016   BMI 40.0-44.9, adult (HCC) 01/15/2014    ONSET DATE: 04/11/23  REFERRING DIAG:  Diagnosis  I63.89 (ICD-10-CM) - Other cerebral infarction    THERAPY DIAG:  No diagnosis found.  Rationale for Evaluation and Treatment: Rehabilitation  SUBJECTIVE:                                                                                                                                                                                             SUBJECTIVE STATEMENT: I am okay, nothing new  PERTINENT HISTORY: Latoya Roth is a 52 y.o. female who presented to the ED on 04/11/2023 complaining of right-sided weakness and right-facial droop that started four days prior to presentation. Head CT obtained with strong suspicion for a pontine  infarct which was followed by an MRI that confirmed a subacute left paramedian pons infarct. Neurology was consulted for stroke workup. Does not use tobacco. Drinks about 2 alcoholic servings per day. Medical history significant for hypertension, HIV on HARRT therapy, DM-2. Vitals were relatively stable. Labs showed sodium slightly low at 135. Urine drug screen was negative. Urinalysis was negative as well. EKG showed normal sinus rhythm. Hgb A1c 8.0%. LDL 92. 2D echo 55-60%. Plavix and aspirin started. Continue for 3 weeks then aspirin alone. Diabetic coordinator consulted. Lovenox for DVT prophy. She is requiring mod A to prevent loss of balance. Tolerating carb modified diet.   Latoya Roth was admitted to  rehab 04/16/2023 for inpatient therapies to consist of PT, ST and OT at least three hours five days a week. Past admission physiatrist, therapy team and rehab RN have worked together to provide customized collaborative inpatient rehab. She has had improvement in functional use RUE/LUE  and RLE/LLE as well as improvement in awareness. 4/30 . Patient completed stand pivot transfer with supervision and no AD to wheelchair. Propelled herself in w/c ~161ft with supervision using BUE - difficulty maintaining straight path due to RUE weakness. Mod I in room. PT arranged through [facility].   DM, HIV, HTN   PAIN:  Are you having pain? No  PRECAUTIONS: None  WEIGHT BEARING RESTRICTIONS: No  FALLS: Has patient fallen in last 6 months? Yes. 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 No acute hemorrhage or mass  effect.  IMPRESSION: No large vessel occlusion or proximal hemodynamically significant stenosis.  COGNITION: Overall cognitive status: Within functional limits for tasks assessed   SENSATION: WFL  POSTURE: rounded shoulders, forward head, increased thoracic kyphosis, and flexed trunk   Increased knee valgus  LOWER EXTREMITY ROM:  grossly WFL    LOWER EXTREMITY MMT:    MMT Right Eval Left Eval  Hip flexion 3+ 5  Hip extension    Hip abduction    Hip adduction    Hip internal rotation    Hip external rotation    Knee flexion 4 5  Knee extension 3+ 4  Ankle dorsiflexion    Ankle plantarflexion    Ankle inversion    Ankle eversion    (Blank rows = not tested)   TRANSFERS: Assistive device utilized: Environmental consultant - 4 wheeled  Sit to stand: Min A Stand to sit: Modified independence   GAIT: Gait pattern: step through pattern, decreased stance time- Right, decreased stride length, genu recurvatum- Right, trendelenburg, and narrow BOS Distance walked: in clinic distances Assistive device utilized: Environmental consultant - 4 wheeled Level of assistance: Modified independence Comments: increased genu valgum on bilateral knees   FUNCTIONAL TESTS:  5 times sit to stand: 33.21s Timed up and go (TUG): 21s Berg Balance Scale: 39/56   TODAY'S TREATMENT:                                                                                                                              DATE:  05/22/23 Standing hip flexion 3# 2x10 Hip abd 3# 2x10  Bike L4.5 x5 mins  Standing on airex feet together, then eyes closed 30s  Standing on airex marching 20 reps alt holding on to rollator  STS from blue chair with  double airex on top x10 Walking outdoor down sidewalk and up curb to picnic table and back Toe raises 2x10 Calf raises 2x10    05/16/23 NuStep L5 x37mins Calf stretch 30s  Calf raises 2x10  Step ups 6" x5 each side, fatigue and difficulty, tried on 4" instead able to do x10 with  fatigue Walking on beam in II bars Tandem standing 10s  Walking laps without AD 2 big laps x2 with rest break in between  ankleTB green 20 reps all ways both ankles   05/15/23 Walking 3 laps w/o AD  STS on airex from elevated mat table x10, pain in knees - CGA Leg ext 10# 2x10, RLE x5 HS curls 20# 2x10, RLE x10 3# marching 20 reps minA  3# hip abd 2x10  Leg press 20# 2x10  NuStep L5 x61mins    05/08/23 LAQ 2# 2x10 Marching 2# 20 reps alt x2 HS curls green 2x10  STS with red ball chest press 2x10  NuStep L4 x48mins  Walking without AD 1 lap in gym  Step ups 4" at stairs  Standing on airex feet together 25s at best, then eyes closed 10s in // bars  Step up on airex in // bars holding on x5 each side    05/02/23- EVAL   PATIENT EDUCATION: Education details: POC and HEP Person educated: Patient Education method: Explanation Education comprehension: verbalized understanding  HOME EXERCISE PROGRAM: Access Code: [email or web address] URL: [email or web address] Date: 05/02/2023 Prepared by: Cassie Freer  Exercises - Standing Hip Abduction with Counter Support  - 1 x daily - 7 x weekly - 2 sets - 10 reps - Mini Squat with Counter Support  - 1 x daily - 7 x weekly - 2 sets - 10 reps - Standing March with Counter Support  - 1 x daily - 7 x weekly - 2 sets - 10 reps - Standing Tandem Balance with Counter Support  - 2 x daily - 7 x weekly - 10-30 hold - Standing Single Leg Stance with Counter Support  - 2 x daily - 7 x weekly - 10 hold  GOALS: Goals reviewed with patient? Yes  SHORT TERM GOALS: Target date: 06/13/23  Patient will be independent with initial HEP. Goal status: INITIAL  2.  Patient will demonstrate decreased fall risk by scoring < 15 sec on TUG w/o AD Baseline: 21 with rollator  Goal status: INITIAL   LONG TERM GOALS: Target date: 07/25/23  Patient will be independent with advanced/ongoing HEP to improve outcomes and carryover.  Goal status: INITIAL  2.   Patient will be able to ambulate 500' with LRAD with good safety to access community.  Baseline: using rollator Goal status: INITIAL  3.  Patient will be able to step up/down curb safely with LRAD for safety with community ambulation.  Goal status: INITIAL   4.  Patient will demonstrate improved functional LE strength as demonstrated by 5xSTS <15s. Baseline: 33.21s Goal status: INITIAL  5.  Patient will score 47 on Berg Balance test to demonstrate lower risk of falls. (MCID= 8 points) .  Baseline: 39 Goal status: INITIAL   ASSESSMENT:  CLINICAL IMPRESSION: Patient was seen today for physical therapy treatment for post stroke, weakness, and balance. We worked on some functional strengthening and balance training today. Fatigue present after the bike. Still has some difficulty with balance and STS today. She reports fear with walking outside without her walker, so we tried it today. ModA needed with step down and step up curb, needs  cues to talk bigger steps and to clear her toes. She does have occasionally toe drag and staggering in her gait. Can practice outside more with cane next time.   OBJECTIVE IMPAIRMENTS: Abnormal gait, decreased balance, difficulty walking, and decreased strength.   REHAB POTENTIAL: Good  CLINICAL DECISION MAKING: Stable/uncomplicated  EVALUATION COMPLEXITY: Low  PLAN:  PT FREQUENCY: 2x/week  PT DURATION: 12 weeks  PLANNED INTERVENTIONS: Therapeutic exercises, Therapeutic activity, Neuromuscular re-education, Balance training, Gait training, Patient/Family education, Self Care, Joint mobilization, Stair training, Dry Needling, Cryotherapy, Moist heat, Ionotophoresis 4mg /ml Dexamethasone, and Manual therapy  PLAN FOR NEXT SESSION: continue to work on LE strengthening and [employer], PT 05/21/2023, 3:56 PM

## 2023-05-22 ENCOUNTER — Ambulatory Visit: Payer: Medicaid Other

## 2023-05-22 DIAGNOSIS — I6389 Other cerebral infarction: Secondary | ICD-10-CM

## 2023-05-22 DIAGNOSIS — R2689 Other abnormalities of gait and mobility: Secondary | ICD-10-CM

## 2023-05-22 DIAGNOSIS — M6281 Muscle weakness (generalized): Secondary | ICD-10-CM

## 2023-05-22 DIAGNOSIS — R278 Other lack of coordination: Secondary | ICD-10-CM

## 2023-05-23 NOTE — Therapy (Signed)
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   Depression 04/12/2023   Moderate persistent asthma 04/12/2023   Allergic rhinitis 04/12/2023   Acute ischemic stroke (HCC) 04/11/2023   Counseled about COVID-19 virus infection 01/25/2020   Screening examination for venereal disease 12/31/2017   Encounter for long-term (current) use of high-risk medication 12/31/2017   Medication monitoring encounter 05/06/2017   DM2 (diabetes mellitus, type 2) (HCC) 04/04/2017   HIV (human immunodeficiency virus infection) (HCC) 03/13/2017   Essential hypertension 04/27/2016   Asthma 04/27/2016   BMI 40.0-44.9, adult (HCC) 01/15/2014    ONSET DATE: 04/11/23  REFERRING DIAG:  Diagnosis  I63.89 (ICD-10-CM) - Other cerebral infarction    THERAPY DIAG:  No diagnosis found.  Rationale for Evaluation and Treatment: Rehabilitation  SUBJECTIVE:                                                                                                                                                                                             SUBJECTIVE STATEMENT: I am okay, nothing new  PERTINENT HISTORY: Latoya Roth is a 52 y.o. female who presented to the ED on 04/11/2023 complaining of right-sided weakness and right-facial droop that started four days prior to presentation. Head CT obtained with strong suspicion for a pontine  infarct which was followed by an MRI that confirmed a subacute left paramedian pons infarct. Neurology was consulted for stroke workup. Does not use tobacco. Drinks about 2 alcoholic servings per day. Medical history significant for hypertension, HIV on HARRT therapy, DM-2. Vitals were relatively stable. Labs showed sodium slightly low at 135. Urine drug screen was negative. Urinalysis was negative as well. EKG showed normal sinus rhythm. Hgb A1c 8.0%. LDL 92. 2D echo 55-60%. Plavix and aspirin started. Continue for 3 weeks then aspirin alone. Diabetic coordinator consulted. Lovenox for DVT prophy. She is requiring mod A to prevent loss of balance. Tolerating carb modified diet.   Latoya Roth was admitted to  rehab 04/16/2023 for inpatient therapies to consist of PT, ST and OT at least three hours five days a week. Past admission physiatrist, therapy team and rehab RN have worked together to provide customized collaborative inpatient rehab. She has had improvement in functional use RUE/LUE  and RLE/LLE as well as improvement in awareness. 4/30 . Patient completed stand pivot transfer with supervision and no AD to wheelchair. Propelled herself in w/c ~141ft with supervision using BUE - difficulty maintaining straight path due to RUE weakness. Mod I in room. PT arranged through [facility].   DM, HIV, HTN   PAIN:  Are you having pain? No  PRECAUTIONS: None  WEIGHT BEARING RESTRICTIONS: No  FALLS: Has patient fallen in last 6 months? Yes. 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 No acute hemorrhage or mass  effect.  IMPRESSION: No large vessel occlusion or proximal hemodynamically significant stenosis.  COGNITION: Overall cognitive status: Within functional limits for tasks assessed   SENSATION: WFL  POSTURE: rounded shoulders, forward head, increased thoracic kyphosis, and flexed trunk   Increased knee valgus  LOWER EXTREMITY ROM:  grossly WFL    LOWER EXTREMITY MMT:    MMT Right Eval Left Eval  Hip flexion 3+ 5  Hip extension    Hip abduction    Hip adduction    Hip internal rotation    Hip external rotation    Knee flexion 4 5  Knee extension 3+ 4  Ankle dorsiflexion    Ankle plantarflexion    Ankle inversion    Ankle eversion    (Blank rows = not tested)   TRANSFERS: Assistive device utilized: Environmental consultant - 4 wheeled  Sit to stand: Min A Stand to sit: Modified independence   GAIT: Gait pattern: step through pattern, decreased stance time- Right, decreased stride length, genu recurvatum- Right, trendelenburg, and narrow BOS Distance walked: in clinic distances Assistive device utilized: Environmental consultant - 4 wheeled Level of assistance: Modified independence Comments: increased genu valgum on bilateral knees   FUNCTIONAL TESTS:  5 times sit to stand: 33.21s Timed up and go (TUG): 21s Berg Balance Scale: 39/56   TODAY'S TREATMENT:                                                                                                                              DATE:  05/24/23 Walking outdoors half way around [place] and up and down curbs with SPC STS on airex 2x5 Step ups on airex x10 each side modA  Tandem stance 30s  SLS 3s at best both sides  Ball squeezes 2x10 Bridges 2x10  SLR 2x10  Fitter pushes 1 black band Calf raises 2x12   05/22/23 Standing hip flexion 3# 2x10 Hip abd 3# 2x10  Bike L4.5 x5 mins  Standing on airex feet together, then eyes closed 30s  Standing on airex marching 20 reps alt holding on to rollator  STS from blue chair with double airex on top  x10 Walking outdoor down sidewalk and up curb to picnic table and back Toe raises 2x10 Calf raises 2x10    05/16/23 NuStep L5 x108mins Calf stretch 30s  Calf raises 2x10  Step ups 6" x5 each side, fatigue and difficulty, tried on 4" instead able to do x10 with fatigue Walking on beam in II bars Tandem standing 10s  Walking laps without AD 2 big laps x2 with rest break in between  ankleTB green 20 reps all ways both ankles   05/15/23 Walking 3 laps w/o AD  STS on airex from elevated mat table x10, pain in knees - CGA Leg ext 10# 2x10, RLE x5 HS curls 20# 2x10, RLE x10 3# marching 20 reps minA  3# hip abd 2x10  Leg press 20# 2x10  NuStep L5 x14mins    05/08/23 LAQ 2# 2x10 Marching 2# 20 reps alt x2 HS curls green 2x10  STS with red ball chest press 2x10  NuStep L4 x30mins  Walking without AD 1 lap in gym  Step ups 4" at stairs  Standing on airex feet together 25s at best, then eyes closed 10s in // bars  Step up on airex in // bars holding on x5 each side    05/02/23- EVAL   PATIENT EDUCATION: Education details: POC and HEP Person educated: Patient Education method: Explanation Education comprehension: verbalized understanding  HOME EXERCISE PROGRAM: Access Code: [email or web address] URL: [email or web address] Date: 05/02/2023 Prepared by: Cassie Freer  Exercises - Standing Hip Abduction with Counter Support  - 1 x daily - 7 x weekly - 2 sets - 10 reps - Mini Squat with Counter Support  - 1 x daily - 7 x weekly - 2 sets - 10 reps - Standing March with Counter Support  - 1 x daily - 7 x weekly - 2 sets - 10 reps - Standing Tandem Balance with Counter Support  - 2 x daily - 7 x weekly - 10-30 hold - Standing Single Leg Stance with Counter Support  - 2 x daily - 7 x weekly - 10 hold  GOALS: Goals reviewed with patient? Yes  SHORT TERM GOALS: Target date: 06/13/23  Patient will be independent with initial HEP. Goal status: INITIAL  2.  Patient will demonstrate  decreased fall risk by scoring < 15 sec on TUG w/o AD Baseline: 21 with rollator  Goal status: INITIAL   LONG TERM GOALS: Target date: 07/25/23  Patient will be independent with advanced/ongoing HEP to improve outcomes and carryover.  Goal status: INITIAL  2.  Patient will be able to ambulate 500' with LRAD with good safety to access community.  Baseline: using rollator Goal status: INITIAL  3.  Patient will be able to step up/down curb safely with LRAD for safety with community ambulation.  Goal status: INITIAL   4.  Patient will demonstrate improved functional LE strength as demonstrated by 5xSTS <15s. Baseline: 33.21s Goal status: INITIAL  5.  Patient will score 47 on Berg Balance test to demonstrate lower risk of falls. (MCID= 8 points) .  Baseline: 39 Goal status: INITIAL   ASSESSMENT:  CLINICAL IMPRESSION: Patient was seen today for physical  therapy treatment for post stroke, weakness, and balance. We worked on some Copywriter, advertising today.  Still has some difficulty with balance and STS today. Patient continues to have difficulty clearing her feet and begins to shuffle more with fatigue especially when walking. Was able to walk further with cane today outdoors. Needs modA with curb navigation and stepping up on airex. Occasionally catches her toes and need assistance to prevent falling.   OBJECTIVE IMPAIRMENTS: Abnormal gait, decreased balance, difficulty walking, and decreased strength.   REHAB POTENTIAL: Good  CLINICAL DECISION MAKING: Stable/uncomplicated  EVALUATION COMPLEXITY: Low  PLAN:  PT FREQUENCY: 2x/week  PT DURATION: 12 weeks  PLANNED INTERVENTIONS: Therapeutic exercises, Therapeutic activity, Neuromuscular re-education, Balance training, Gait training, Patient/Family education, Self Care, Joint mobilization, Stair training, Dry Needling, Cryotherapy, Moist heat, Ionotophoresis 4mg /ml Dexamethasone, and Manual therapy  PLAN FOR NEXT  SESSION: continue to work on LE strengthening and [employer], PT 05/21/2023, 3:56 PM

## 2023-05-24 ENCOUNTER — Ambulatory Visit: Payer: Medicaid Other

## 2023-05-24 DIAGNOSIS — M6281 Muscle weakness (generalized): Secondary | ICD-10-CM

## 2023-05-24 DIAGNOSIS — R278 Other lack of coordination: Secondary | ICD-10-CM

## 2023-05-24 DIAGNOSIS — I6389 Other cerebral infarction: Secondary | ICD-10-CM | POA: Diagnosis not present

## 2023-05-24 DIAGNOSIS — R2689 Other abnormalities of gait and mobility: Secondary | ICD-10-CM

## 2023-05-28 NOTE — Therapy (Signed)
OUTPATIENT PHYSICAL THERAPY NEURO TREATMENT   Patient Name: Latoya Roth MRN: [id number] DOB:1971/02/25, 52 y.o., female Today's Date: 05/29/2023   PCP: Jackie Plum REFERRING PROVIDER: Mariam Dollar  END OF SESSION:  PT End of Session - 05/29/23 0951     Visit Number 7    Date for PT Re-Evaluation 07/25/23    PT Start Time 0950    PT Stop Time 1035    PT Time Calculation (min) 45 min               Past Medical History:  Diagnosis Date   Anxiety    Arthritis    Asthma    Bell's palsy    BV (bacterial vaginosis)    Depression    Diabetes mellitus    HIV disease (HCC)    Hypertension    Obesity    Past Surgical History:  Procedure Laterality Date   CHOLECYSTECTOMY     Patient Active Problem List   Diagnosis Date Noted   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   Depression 04/12/2023   Moderate persistent asthma 04/12/2023   Allergic rhinitis 04/12/2023   Acute ischemic stroke (HCC) 04/11/2023   Counseled about COVID-19 virus infection 01/25/2020   Screening examination for venereal disease 12/31/2017   Encounter for long-term (current) use of high-risk medication 12/31/2017   Medication monitoring encounter 05/06/2017   DM2 (diabetes mellitus, type 2) (HCC) 04/04/2017   HIV (human immunodeficiency virus infection) (HCC) 03/13/2017   Essential hypertension 04/27/2016   Asthma 04/27/2016   BMI 40.0-44.9, adult (HCC) 01/15/2014    ONSET DATE: 04/11/23  REFERRING DIAG:  Diagnosis  I63.89 (ICD-10-CM) - Other cerebral infarction    THERAPY DIAG:  Acute brainstem infarction (HCC)  Muscle weakness (generalized)  Other abnormalities of gait and mobility  Other lack of coordination  Rationale for Evaluation and Treatment: Rehabilitation  SUBJECTIVE:                                                                                                                                                                                              SUBJECTIVE STATEMENT: I walked without my rollator this weekend at the store. Was able to walk in to the store without it but used a cart while inside.   PERTINENT HISTORY: AKASIA BILLE is a 52 y.o. female who presented to the ED on 04/11/2023 complaining of right-sided weakness and right-facial droop that started four days prior to presentation. Head CT obtained with strong suspicion for a pontine infarct which was followed by an MRI that confirmed a subacute left paramedian pons infarct. Neurology was consulted for stroke workup.  Does not use tobacco. Drinks about 2 alcoholic servings per day. Medical history significant for hypertension, HIV on HARRT therapy, DM-2. Vitals were relatively stable. Labs showed sodium slightly low at 135. Urine drug screen was negative. Urinalysis was negative as well. EKG showed normal sinus rhythm. Hgb A1c 8.0%. LDL 92. 2D echo 55-60%. Plavix and aspirin started. Continue for 3 weeks then aspirin alone. Diabetic coordinator consulted. Lovenox for DVT prophy. She is requiring mod A to prevent loss of balance. Tolerating carb modified diet.   TYIA MCGRAIN was admitted to rehab 04/16/2023 for inpatient therapies to consist of PT, ST and OT at least three hours five days a week. Past admission physiatrist, therapy team and rehab RN have worked together to provide customized collaborative inpatient rehab. She has had improvement in functional use RUE/LUE  and RLE/LLE as well as improvement in awareness. 4/30 . Patient completed stand pivot transfer with supervision and no AD to wheelchair. Propelled herself in w/c ~125ft with supervision using BUE - difficulty maintaining straight path due to RUE weakness. Mod I in room. PT arranged through [facility].   DM, HIV, HTN   PAIN:  Are you having pain? No  PRECAUTIONS: None  WEIGHT BEARING RESTRICTIONS: No  FALLS: Has patient fallen in last 6 months? Yes. 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 No acute hemorrhage or mass effect.  IMPRESSION: No large vessel occlusion or proximal hemodynamically significant stenosis.  COGNITION: Overall cognitive status: Within functional limits for tasks assessed   SENSATION: WFL  POSTURE: rounded shoulders, forward head, increased thoracic kyphosis, and flexed trunk   Increased knee valgus  LOWER EXTREMITY ROM:  grossly WFL    LOWER EXTREMITY MMT:    MMT Right Eval Left Eval  Hip flexion 3+ 5  Hip extension    Hip abduction    Hip adduction    Hip internal rotation    Hip external rotation    Knee flexion 4 5  Knee extension 3+ 4  Ankle dorsiflexion    Ankle plantarflexion    Ankle inversion    Ankle eversion    (Blank rows = not tested)   TRANSFERS: Assistive device utilized: Environmental consultant - 4 wheeled  Sit to stand: Min A Stand to sit: Modified independence   GAIT: Gait pattern: step through pattern, decreased stance time- Right, decreased stride length, genu recurvatum- Right, trendelenburg, and narrow BOS Distance walked: in clinic distances Assistive device utilized: Environmental consultant - 4 wheeled Level of assistance: Modified independence Comments: increased genu valgum on bilateral knees   FUNCTIONAL TESTS:  5 times sit to stand: 33.21s Timed up and go (TUG): 21s Berg Balance Scale: 39/56   TODAY'S TREATMENT:  DATE:  05/29/23 NuStep L5 x35mins  Walking on beam On airex cone taps in II bars  Volleyball hits  standing in bars  Resisted gait 20# total, x4 forwards, x2 sideways  Leg ext 10# 2x8 HS curls 20# 2x10   05/24/23 Walking outdoors half way around [place] and up and down curbs with SPC STS on airex 2x5 Step ups on airex x10 each side modA  Tandem stance 30s  SLS 3s at best both sides  Ball squeezes 2x10 Bridges 2x10 SLR 2x10  Fitter pushes 1 black band Calf raises 2x12   05/22/23 Standing hip flexion 3# 2x10 Hip abd 3# 2x10  Bike L4.5 x5 mins  Standing on airex feet together, then eyes closed 30s  Standing on airex marching 20 reps alt holding on to rollator  STS from blue chair with double airex on top x10 Walking outdoor down sidewalk and up curb to picnic table and back Toe raises 2x10 Calf raises 2x10    05/16/23 NuStep L5 x56mins Calf stretch 30s  Calf raises 2x10  Step ups 6" x5 each side, fatigue and difficulty, tried on 4" instead able to do x10 with fatigue Walking on beam in II bars Tandem standing 10s  Walking laps without AD 2 big laps x2 with rest break in between  ankleTB green 20 reps all ways both ankles   05/15/23 Walking 3 laps w/o AD  STS on airex from elevated mat table x10, pain in knees - CGA Leg ext 10# 2x10, RLE x5 HS curls 20# 2x10, RLE x10 3# marching 20 reps minA  3# hip abd 2x10  Leg press 20# 2x10  NuStep L5 x21mins    05/08/23 LAQ 2# 2x10 Marching 2# 20 reps alt x2 HS curls green 2x10  STS with red ball chest press 2x10  NuStep L4 x42mins  Walking without AD 1 lap in gym  Step ups 4" at stairs  Standing on airex feet together 25s at best, then eyes closed 10s in // bars  Step up on airex in // bars holding on x5 each side    05/02/23- EVAL   PATIENT EDUCATION: Education details: POC and HEP Person educated: Patient Education method: Explanation Education comprehension: verbalized understanding  HOME EXERCISE PROGRAM: Access Code: [email or web address] URL: [email or web address] Date: 05/02/2023 Prepared by: Cassie Freer  Exercises - Standing Hip Abduction with Counter Support  - 1 x daily - 7 x weekly - 2 sets - 10 reps - Mini Squat with Counter Support  - 1 x daily - 7 x weekly - 2 sets - 10 reps - Standing March with Counter Support  - 1 x daily - 7 x weekly - 2 sets - 10 reps - Standing Tandem Balance with Counter Support  - 2 x daily - 7 x weekly - 10-30 hold - Standing Single Leg Stance with Counter Support  - 2 x daily - 7 x weekly - 10 hold  GOALS: Goals reviewed with patient? Yes  SHORT TERM GOALS: Target date: 06/13/23  Patient will be independent with initial HEP. Goal status: INITIAL  2.  Patient will demonstrate decreased fall risk by scoring < 15 sec on TUG w/o AD Baseline: 21 with rollator  Goal status: INITIAL   LONG TERM GOALS: Target date: 07/25/23  Patient will be independent with advanced/ongoing HEP to improve outcomes and carryover.  Goal status: INITIAL  2.  Patient will be able to ambulate 500' with LRAD with good safety to access community.  Baseline: using rollator  Goal status: INITIAL  3.  Patient will be able to step up/down curb safely with LRAD for safety with community ambulation.  Goal status: INITIAL   4.  Patient will demonstrate improved functional LE strength as demonstrated by 5xSTS <15s. Baseline: 33.21s Goal status: INITIAL  5.  Patient will score 47 on Berg Balance test to demonstrate lower risk of falls. (MCID= 8 points) .  Baseline: 39 Goal status: INITIAL   ASSESSMENT:  CLINICAL IMPRESSION: Patient was seen today for physical therapy treatment for post stroke, weakness, and balance. We worked on some [employer] today.   Most difficulty with walking on beam, especially tandem walking on beam. And with cone taps on airex, unable to do without holding on. Demonstrates good stepping strategies  forwards and backwards with volleyball hits. Unable to continue with resisted gait especially side stepping when leading with R  side. Fatigue in LE's at end of visit with strengthening on machine. Continue to work and challenge her balance.    OBJECTIVE IMPAIRMENTS: Abnormal gait, decreased balance, difficulty walking, and decreased strength.   REHAB POTENTIAL: Good  CLINICAL DECISION MAKING: Stable/uncomplicated  EVALUATION COMPLEXITY: Low  PLAN:  PT FREQUENCY: 2x/week  PT DURATION: 12 weeks  PLANNED INTERVENTIONS: Therapeutic exercises, Therapeutic activity, Neuromuscular re-education, Balance training, Gait training, Patient/Family education, Self Care, Joint mobilization, Stair training, Dry Needling, Cryotherapy, Moist heat, Ionotophoresis 4mg /ml Dexamethasone, and Manual therapy  PLAN FOR NEXT SESSION: continue to work on LE strengthening and [employer], PT 05/29/2023, 10:37 AM

## 2023-05-29 ENCOUNTER — Ambulatory Visit: Payer: Medicaid Other | Attending: Internal Medicine

## 2023-05-29 DIAGNOSIS — R278 Other lack of coordination: Secondary | ICD-10-CM | POA: Insufficient documentation

## 2023-05-29 DIAGNOSIS — I6389 Other cerebral infarction: Secondary | ICD-10-CM | POA: Diagnosis present

## 2023-05-29 DIAGNOSIS — M6281 Muscle weakness (generalized): Secondary | ICD-10-CM | POA: Insufficient documentation

## 2023-05-29 DIAGNOSIS — R2689 Other abnormalities of gait and mobility: Secondary | ICD-10-CM | POA: Insufficient documentation

## 2023-05-31 ENCOUNTER — Ambulatory Visit: Payer: Medicaid Other

## 2023-06-04 ENCOUNTER — Other Ambulatory Visit: Payer: Self-pay | Admitting: Physical Medicine and Rehabilitation

## 2023-06-05 ENCOUNTER — Ambulatory Visit: Payer: Medicaid Other

## 2023-06-05 DIAGNOSIS — R278 Other lack of coordination: Secondary | ICD-10-CM

## 2023-06-05 DIAGNOSIS — I6389 Other cerebral infarction: Secondary | ICD-10-CM | POA: Diagnosis not present

## 2023-06-05 DIAGNOSIS — M6281 Muscle weakness (generalized): Secondary | ICD-10-CM

## 2023-06-05 DIAGNOSIS — R2689 Other abnormalities of gait and mobility: Secondary | ICD-10-CM

## 2023-06-05 NOTE — Therapy (Signed)
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   Depression 04/12/2023   Moderate persistent asthma 04/12/2023   Allergic rhinitis 04/12/2023   Acute ischemic stroke (HCC) 04/11/2023   Counseled about COVID-19 virus infection 01/25/2020   Screening examination for venereal disease 12/31/2017   Encounter for long-term (current) use of high-risk medication 12/31/2017   Medication monitoring encounter 05/06/2017   DM2 (diabetes mellitus, type 2) (HCC) 04/04/2017   HIV (human immunodeficiency virus infection) (HCC) 03/13/2017   Essential hypertension 04/27/2016   Asthma 04/27/2016   BMI 40.0-44.9, adult (HCC) 01/15/2014    ONSET DATE: 04/11/23  REFERRING DIAG:  Diagnosis  I63.89 (ICD-10-CM) - Other cerebral infarction    THERAPY DIAG:  No diagnosis found.  Rationale for Evaluation and Treatment: Rehabilitation  SUBJECTIVE:                                                                                                                                                                                             SUBJECTIVE STATEMENT: I walked without my rollator this weekend at the store. Was able to walk in to the store without it but used a cart while inside.   PERTINENT HISTORY: Latoya Roth is a 52 y.o. female who presented to the ED on 04/11/2023 complaining of right-sided weakness and right-facial  droop that started four days prior to presentation. Head CT obtained with strong suspicion for a pontine infarct which was followed by an MRI that confirmed a subacute left paramedian pons infarct. Neurology was consulted for stroke workup. Does not use tobacco. Drinks about 2 alcoholic servings per day. Medical history significant for hypertension, HIV on HARRT therapy, DM-2. Vitals were relatively stable. Labs showed sodium slightly low at 135. Urine drug screen was negative. Urinalysis was negative as well. EKG showed normal sinus rhythm. Hgb A1c 8.0%. LDL 92. 2D echo 55-60%. Plavix and aspirin started. Continue for 3 weeks then aspirin alone. Diabetic coordinator consulted. Lovenox for DVT  prophy. She is requiring mod A to prevent loss of balance. Tolerating carb modified diet.   Latoya Roth was admitted to rehab 04/16/2023 for inpatient therapies to consist of PT, ST and OT at least three hours five days a week. Past admission physiatrist, therapy team and rehab RN have worked together to provide customized collaborative inpatient rehab. She has had improvement in functional use RUE/LUE  and RLE/LLE as well as improvement in awareness. 4/30 . Patient completed stand pivot transfer with supervision and no AD to wheelchair. Propelled herself in w/c ~162ft with supervision using BUE - difficulty maintaining straight path due to RUE weakness. Mod I in room. PT arranged through [facility].   DM, HIV, HTN   PAIN:  Are you having pain? No  PRECAUTIONS: None  WEIGHT BEARING RESTRICTIONS: No  FALLS: Has patient fallen in last 6 months? Yes. 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 No acute hemorrhage or mass effect.  IMPRESSION: No large vessel occlusion or proximal hemodynamically significant stenosis.  COGNITION: Overall cognitive status: Within functional limits for tasks assessed   SENSATION: WFL  POSTURE: rounded shoulders, forward head, increased thoracic kyphosis, and flexed trunk   Increased knee valgus  LOWER EXTREMITY ROM:  grossly WFL    LOWER EXTREMITY MMT:    MMT Right Eval Left Eval  Hip flexion 3+ 5  Hip extension    Hip abduction    Hip adduction    Hip internal rotation    Hip external rotation    Knee flexion 4 5  Knee extension 3+ 4  Ankle dorsiflexion    Ankle plantarflexion    Ankle inversion    Ankle eversion    (Blank rows = not tested)   TRANSFERS: Assistive device utilized: Environmental consultant - 4 wheeled  Sit to stand: Min A Stand to sit: Modified independence   GAIT: Gait pattern: step through pattern, decreased stance time- Right, decreased stride length, genu recurvatum- Right, trendelenburg, and narrow BOS Distance walked: in clinic distances Assistive device utilized: Environmental consultant - 4 wheeled Level of assistance: Modified independence Comments: increased genu valgum on bilateral knees   FUNCTIONAL TESTS:  5 times sit to stand: 33.21s Timed up and go (TUG): 21s Berg Balance Scale: 39/56   TODAY'S TREATMENT:                                                                                                                              DATE:  06/05/23 NuStep L5 x33mins  Walking with quad cane 2 big laps in gym Calf raises 2x12 Side steps over  obstacles  STS on airex with OHP 3x5 Standing on airex heel taps 6" Tandem standing on beam  Stepping and reaching targets behind her  Ankle TB green 20 reps each way R  05/29/23 NuStep L5 x15mins  Walking on beam On airex cone taps in II bars  Volleyball hits standing in bars  Resisted gait 20#  total, x4 forwards, x2 sideways  Leg ext 10# 2x8 HS curls 20# 2x10   05/24/23 Walking outdoors half way around [place] and up and down curbs with SPC STS on airex 2x5 Step ups on airex x10 each side modA  Tandem stance 30s  SLS 3s at best both sides  Ball squeezes 2x10 Bridges 2x10 SLR 2x10  Fitter pushes 1 black band Calf raises 2x12   05/22/23 Standing hip flexion 3# 2x10 Hip abd 3# 2x10  Bike L4.5 x5 mins  Standing on airex feet together, then eyes closed 30s  Standing on airex marching 20 reps alt holding on to rollator  STS from blue chair with double airex on top x10 Walking outdoor down sidewalk and up curb to picnic table and back Toe raises 2x10 Calf raises 2x10    05/16/23 NuStep L5 x44mins Calf stretch 30s  Calf raises 2x10  Step ups 6" x5 each side, fatigue and difficulty, tried on 4" instead able to do x10 with fatigue Walking on beam in II bars Tandem standing 10s  Walking laps without AD 2 big laps x2 with rest break in between  ankleTB green 20 reps all ways both ankles   05/15/23 Walking 3 laps w/o AD  STS on airex from elevated mat table x10, pain in knees - CGA Leg ext 10# 2x10, RLE x5 HS curls 20# 2x10, RLE x10 3# marching 20 reps minA  3# hip abd 2x10  Leg press 20# 2x10  NuStep L5 x55mins    05/08/23 LAQ 2# 2x10 Marching 2# 20 reps alt x2 HS curls green 2x10  STS with red ball chest press 2x10  NuStep L4 x85mins  Walking without AD 1 lap in gym  Step ups 4" at stairs  Standing on airex feet together 25s at best, then eyes closed 10s in // bars  Step up on airex in // bars holding on x5 each side    05/02/23- EVAL   PATIENT EDUCATION: Education details: POC and HEP Person educated: Patient Education method: Explanation Education comprehension: verbalized understanding  HOME EXERCISE PROGRAM: Access Code: [email or web address] URL: [email or web address] Date: 05/02/2023 Prepared by: Cassie Freer  Exercises - Standing Hip  Abduction with Counter Support  - 1 x daily - 7 x weekly - 2 sets - 10 reps - Mini Squat with Counter Support  - 1 x daily - 7 x weekly - 2 sets - 10 reps - Standing March with Counter Support  - 1 x daily - 7 x weekly - 2 sets - 10 reps - Standing Tandem Balance with Counter Support  - 2 x daily - 7 x weekly - 10-30 hold - Standing Single Leg Stance with Counter Support  - 2 x daily - 7 x weekly - 10 hold  GOALS: Goals reviewed with patient? Yes  SHORT TERM GOALS: Target date: 06/13/23  Patient will be independent with initial HEP. Goal status: INITIAL  2.  Patient will demonstrate decreased fall risk by scoring < 15 sec on TUG w/o AD Baseline: 21 with rollator  Goal status: INITIAL   LONG TERM GOALS: Target date: 07/25/23  Patient will be independent with  advanced/ongoing HEP to improve outcomes and carryover.  Goal status: INITIAL  2.  Patient will be able to ambulate 500' with LRAD with good safety to access community.  Baseline: using rollator Goal status: INITIAL  3.  Patient will be able to step up/down curb safely with LRAD for safety with community ambulation.  Goal status: INITIAL   4.  Patient will demonstrate improved functional LE strength as demonstrated by 5xSTS <15s. Baseline: 33.21s Goal status: INITIAL  5.  Patient will score 47 on Berg Balance test to demonstrate lower risk of falls. (MCID= 8 points) .  Baseline: 39 Goal status: INITIAL   ASSESSMENT:  CLINICAL IMPRESSION: Patient continues to ambulate with rollator. She states she did walk outdoors without it the other day with someone else. Continued to work on some strengthening and balance training today.  She states she has a quad cane and wanted to practice with that today. She is able to walk 2 laps in the gym with good stability and balance. Reports dizziness with side steps over obstacles, needs a rest break before starting again. Very fatigued with second attempt. Difficulty with STS on airex, uses  legs against table to control at times.    OBJECTIVE IMPAIRMENTS: Abnormal gait, decreased balance, difficulty walking, and decreased strength.   REHAB POTENTIAL: Good  CLINICAL DECISION MAKING: Stable/uncomplicated  EVALUATION COMPLEXITY: Low  PLAN:  PT FREQUENCY: 2x/week  PT DURATION: 12 weeks  PLANNED INTERVENTIONS: Therapeutic exercises, Therapeutic activity, Neuromuscular re-education, Balance training, Gait training, Patient/Family education, Self Care, Joint mobilization, Stair training, Dry Needling, Cryotherapy, Moist heat, Ionotophoresis 4mg /ml Dexamethasone, and Manual therapy  PLAN FOR NEXT SESSION: continue to work on LE strengthening and [employer], PT 06/05/2023, 7:58 AM

## 2023-06-06 NOTE — Therapy (Addendum)
OUTPATIENT PHYSICAL THERAPY NEURO TREATMENT   Patient Name: Latoya Roth MRN: [id number] DOB:[birth year], 52 y.o., female Today's Date: 06/07/2023   PCP: Jackie Plum REFERRING PROVIDER: Mariam Dollar  END OF SESSION:  PT End of Session - 06/07/23 0842     Visit Number 9    Date for PT Re-Evaluation 07/25/23    PT Start Time 0845    PT Stop Time 0930    PT Time Calculation (min) 45 min                Past Medical History:  Diagnosis Date   Anxiety    Arthritis    Asthma    Bell's palsy    BV (bacterial vaginosis)    Depression    Diabetes mellitus    HIV disease (HCC)    Hypertension    Obesity    Past Surgical History:  Procedure Laterality Date   CHOLECYSTECTOMY     Patient Active Problem List   Diagnosis Date Noted   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   Depression 04/12/2023   Moderate persistent asthma 04/12/2023   Allergic rhinitis 04/12/2023   Acute ischemic stroke (HCC) 04/11/2023   Counseled about COVID-19 virus infection 01/25/2020   Screening examination for venereal disease 12/31/2017   Encounter for long-term (current) use of high-risk medication 12/31/2017   Medication monitoring encounter 05/06/2017   DM2 (diabetes mellitus, type 2) (HCC) 04/04/2017   HIV (human immunodeficiency virus infection) (HCC) 03/13/2017   Essential hypertension 04/27/2016   Asthma 04/27/2016   BMI 40.0-44.9, adult (HCC) 01/15/2014    ONSET DATE: 04/11/23  REFERRING DIAG:  Diagnosis  I63.89 (ICD-10-CM) - Other cerebral infarction    THERAPY DIAG:  Acute brainstem infarction (HCC)  Muscle weakness (generalized)  Other abnormalities of gait and mobility  Other lack of coordination  Rationale for Evaluation and Treatment: Rehabilitation  SUBJECTIVE:                                                                                                                                                                                              SUBJECTIVE STATEMENT: Feeling okay.   PERTINENT HISTORY: Latoya Roth is a 52 y.o. female who presented to the ED on 04/11/2023 complaining of right-sided weakness and right-facial droop that started four days prior to presentation. Head CT obtained with strong suspicion for a pontine infarct which was followed by an MRI that confirmed a subacute left paramedian pons infarct. Neurology was consulted for stroke workup. Does not use tobacco. Drinks about 2 alcoholic servings per day. Medical history significant for hypertension, HIV on HARRT therapy, DM-2. Vitals were  relatively stable. Labs showed sodium slightly low at 135. Urine drug screen was negative. Urinalysis was negative as well. EKG showed normal sinus rhythm. Hgb A1c 8.0%. LDL 92. 2D echo 55-60%. Plavix and aspirin started. Continue for 3 weeks then aspirin alone. Diabetic coordinator consulted. Lovenox for DVT prophy. She is requiring mod A to prevent loss of balance. Tolerating carb modified diet.   Latoya Roth was admitted to rehab 04/16/2023 for inpatient therapies to consist of PT, ST and OT at least three hours five days a week. Past admission physiatrist, therapy team and rehab RN have worked together to provide customized collaborative inpatient rehab. She has had improvement in functional use RUE/LUE  and RLE/LLE as well as improvement in awareness. 4/30 . Patient completed stand pivot transfer with supervision and no AD to wheelchair. Propelled herself in w/c ~137ft with supervision using BUE - difficulty maintaining straight path due to RUE weakness. Mod I in room. PT arranged through [facility].   DM, HIV, HTN   PAIN:  Are you having pain? No  PRECAUTIONS: None  WEIGHT BEARING RESTRICTIONS: No  FALLS: Has patient fallen in last 6 months? Yes. 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 No acute hemorrhage or mass effect.  IMPRESSION: No large vessel occlusion or proximal hemodynamically significant stenosis.  COGNITION: Overall cognitive status: Within functional limits for tasks assessed   SENSATION: WFL  POSTURE: rounded shoulders, forward head, increased thoracic kyphosis, and flexed trunk   Increased knee valgus  LOWER EXTREMITY ROM:  grossly WFL    LOWER EXTREMITY MMT:    MMT Right Eval Left Eval  Hip flexion 3+ 5  Hip extension    Hip abduction    Hip adduction    Hip internal rotation    Hip external rotation    Knee flexion 4 5  Knee extension 3+ 4  Ankle dorsiflexion    Ankle plantarflexion    Ankle inversion    Ankle eversion    (Blank rows = not tested)   TRANSFERS: Assistive device utilized: Environmental consultant - 4 wheeled  Sit to stand: Min A Stand to sit: Modified independence   GAIT: Gait pattern: step through pattern, decreased stance time- Right, decreased stride length, genu recurvatum- Right, trendelenburg, and narrow BOS Distance walked: in clinic distances Assistive device utilized: Environmental consultant - 4 wheeled Level of assistance: Modified independence Comments: increased genu valgum on bilateral knees   FUNCTIONAL TESTS:  5 times sit to stand: 33.21s Timed up and go (TUG): 21s Berg Balance Scale: 39/56   TODAY'S TREATMENT:  DATE:  06/07/23 Walking outdoors with quad cane on grass and around [address], up and down curbs  NuStep L5 x64mins  Leg ext 10# 2x10 HS curls 25# 2x10 Leg press 20# 2x10 Calf raises 2x12 STS with chest press 2x5     06/05/23 NuStep L5 x42mins  Walking with quad cane 2 big laps in gym Calf raises 2x12 Side steps over obstacles  STS on airex with OHP 3x5 Standing on airex heel taps 6" Tandem standing on beam  Stepping and reaching targets behind her  Ankle TB green 20 reps each way R  05/29/23 NuStep L5 x35mins  Walking on beam On airex cone taps in II bars  Volleyball hits standing in bars  Resisted gait 20# total, x4 forwards, x2 sideways  Leg ext 10# 2x8 HS curls 20# 2x10   05/24/23 Walking outdoors half way around [place] and up and down curbs with SPC STS on airex 2x5 Step ups on airex x10 each side modA  Tandem stance 30s  SLS 3s at best both sides  Ball squeezes 2x10 Bridges 2x10 SLR 2x10  Fitter pushes 1 black band Calf raises 2x12   05/22/23 Standing hip flexion 3# 2x10 Hip abd 3# 2x10  Bike L4.5 x5 mins  Standing on airex feet together, then eyes closed 30s  Standing on airex marching 20 reps alt holding on to rollator  STS from blue chair with double airex on top x10 Walking outdoor down sidewalk and up curb to picnic table and back Toe raises 2x10 Calf raises 2x10    05/16/23 NuStep L5 x42mins Calf stretch 30s  Calf raises 2x10  Step ups 6" x5 each side, fatigue and difficulty, tried on 4" instead able to do x10 with fatigue Walking on beam in II bars Tandem standing 10s  Walking laps without AD 2 big laps x2 with rest break in between  ankleTB green 20 reps all ways both ankles   05/15/23 Walking 3 laps w/o AD  STS on airex from elevated mat table x10, pain in knees - CGA Leg ext 10# 2x10, RLE x5 HS curls 20# 2x10, RLE x10 3# marching 20 reps minA  3# hip abd 2x10  Leg press 20# 2x10  NuStep L5 x47mins    05/08/23 LAQ 2# 2x10 Marching 2# 20 reps alt x2 HS curls green 2x10  STS with red ball chest press 2x10  NuStep L4 x21mins  Walking without AD 1 lap in gym  Step ups 4" at stairs  Standing on airex feet together 25s at best, then eyes closed 10s in  // bars  Step up on airex in // bars holding on x5 each side    05/02/23- EVAL   PATIENT EDUCATION: Education details: POC and HEP Person educated: Patient Education method: Explanation Education comprehension: verbalized understanding  HOME EXERCISE PROGRAM: Access Code: [email or web address] URL: [email or web address] Date: 05/02/2023 Prepared by: Cassie Freer  Exercises - Standing Hip Abduction with Counter Support  - 1 x daily - 7 x weekly - 2 sets - 10 reps - Mini Squat with Counter Support  - 1 x daily - 7 x weekly - 2 sets - 10 reps - Standing March with Counter Support  - 1 x daily - 7 x weekly - 2 sets - 10 reps - Standing Tandem Balance with Counter Support  - 2 x daily - 7 x weekly - 10-30 hold - Standing Single Leg Stance with Counter Support  - 2 x daily - 7 x weekly - 10  hold  GOALS: Goals reviewed with patient? Yes  SHORT TERM GOALS: Target date: 06/13/23  Patient will be independent with initial HEP. Goal status: INITIAL  2.  Patient will demonstrate decreased fall risk by scoring < 15 sec on TUG w/o AD Baseline: 21 with rollator  Goal status: INITIAL   LONG TERM GOALS: Target date: 07/25/23  Patient will be independent with advanced/ongoing HEP to improve outcomes and carryover.  Goal status: INITIAL  2.  Patient will be able to ambulate 500' with LRAD with good safety to access community.  Baseline: using rollator Goal status: INITIAL  3.  Patient will be able to step up/down curb safely with LRAD for safety with community ambulation.  Goal status: INITIAL   4.  Patient will demonstrate improved functional LE strength as demonstrated by 5xSTS <15s. Baseline: 33.21s Goal status: INITIAL  5.  Patient will score 47 on Berg Balance test to demonstrate lower risk of falls. (MCID= 8 points) .  Baseline: 39 Goal status: INITIAL   ASSESSMENT:  CLINICAL IMPRESSION: Patient states she is doing well. We worked on some community reintegration today,  walking outdoors using a quad cane. Some fear and minA needed with walking on grass due to unsteadiness. modA needed to go up and down curbs. Reports she gets more fatigued when using the cane.    OBJECTIVE IMPAIRMENTS: Abnormal gait, decreased balance, difficulty walking, and decreased strength.   REHAB POTENTIAL: Good  CLINICAL DECISION MAKING: Stable/uncomplicated  EVALUATION COMPLEXITY: Low  PLAN:  PT FREQUENCY: 2x/week  PT DURATION: 12 weeks  PLANNED INTERVENTIONS: Therapeutic exercises, Therapeutic activity, Neuromuscular re-education, Balance training, Gait training, Patient/Family education, Self Care, Joint mobilization, Stair training, Dry Needling, Cryotherapy, Moist heat, Ionotophoresis 4mg /ml Dexamethasone, and Manual therapy  PLAN FOR NEXT SESSION: continue to work on LE strengthening and balance, progress note    Cassie Freer, PT 06/07/2023, 9:25 AM

## 2023-06-07 ENCOUNTER — Ambulatory Visit: Payer: Medicaid Other

## 2023-06-07 DIAGNOSIS — R278 Other lack of coordination: Secondary | ICD-10-CM

## 2023-06-07 DIAGNOSIS — I6389 Other cerebral infarction: Secondary | ICD-10-CM

## 2023-06-07 DIAGNOSIS — M6281 Muscle weakness (generalized): Secondary | ICD-10-CM

## 2023-06-07 DIAGNOSIS — R2689 Other abnormalities of gait and mobility: Secondary | ICD-10-CM

## 2023-06-11 NOTE — Therapy (Signed)
OUTPATIENT PHYSICAL THERAPY NEURO TREATMENT Progress Note Reporting Period 05/02/23 to 06/12/23  See note below for Objective Data and Assessment of Progress/Goals.      Patient Name: Latoya Roth MRN: [id number] DOB:1971-11-24, 52 y.o., female Today's Date: 06/12/2023   PCP: Jackie Plum REFERRING PROVIDER: Mariam Dollar  END OF SESSION:  PT End of Session - 06/12/23 0847     Visit Number 10    Date for PT Re-Evaluation 07/25/23    PT Start Time 0845    PT Stop Time 0930    PT Time Calculation (min) 45 min                Past Medical History:  Diagnosis Date   Anxiety    Arthritis    Asthma    Bell's palsy    BV (bacterial vaginosis)    Depression    Diabetes mellitus    HIV disease (HCC)    Hypertension    Obesity    Past Surgical History:  Procedure Laterality Date   CHOLECYSTECTOMY     Patient Active Problem List   Diagnosis Date Noted   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   Depression 04/12/2023   Moderate persistent asthma 04/12/2023   Allergic rhinitis 04/12/2023   Acute ischemic stroke (HCC) 04/11/2023   Counseled about COVID-19 virus infection 01/25/2020   Screening examination for venereal disease 12/31/2017   Encounter for long-term (current) use of high-risk medication 12/31/2017   Medication monitoring encounter 05/06/2017   DM2 (diabetes mellitus, type 2) (HCC) 04/04/2017   HIV (human immunodeficiency virus infection) (HCC) 03/13/2017   Essential hypertension 04/27/2016   Asthma 04/27/2016   BMI 40.0-44.9, adult (HCC) 01/15/2014    ONSET DATE: 04/11/23  REFERRING DIAG:  Diagnosis  I63.89 (ICD-10-CM) - Other cerebral infarction    THERAPY DIAG:  Acute brainstem infarction (HCC)  Muscle weakness (generalized)  Other abnormalities of gait and mobility  Other lack of coordination  Rationale for Evaluation and Treatment: Rehabilitation  SUBJECTIVE:                                                                                                                                                                                              SUBJECTIVE STATEMENT: I am alright, for the first time I walked outside with my cane.   PERTINENT HISTORY: Latoya Roth is a 52 y.o. female who presented to the ED on 04/11/2023 complaining of right-sided weakness and right-facial droop that started four days prior to presentation. Head CT obtained with strong suspicion for a pontine infarct which was followed by an MRI that confirmed a subacute left  paramedian pons infarct. Neurology was consulted for stroke workup. Does not use tobacco. Drinks about 2 alcoholic servings per day. Medical history significant for hypertension, HIV on HARRT therapy, DM-2. Vitals were relatively stable. Labs showed sodium slightly low at 135. Urine drug screen was negative. Urinalysis was negative as well. EKG showed normal sinus rhythm. Hgb A1c 8.0%. LDL 92. 2D echo 55-60%. Plavix and aspirin started. Continue for 3 weeks then aspirin alone. Diabetic coordinator consulted. Lovenox for DVT prophy. She is requiring mod A to prevent loss of balance. Tolerating carb modified diet.   SILA MCCOIN was admitted to rehab 04/16/2023 for inpatient therapies to consist of PT, ST and OT at least three hours five days a week. Past admission physiatrist, therapy team and rehab RN have worked together to provide customized collaborative inpatient rehab. She has had improvement in functional use RUE/LUE  and RLE/LLE as well as improvement in awareness. 4/30 . Patient completed stand pivot transfer with supervision and no AD to wheelchair. Propelled herself in w/c ~126ft with supervision using BUE - difficulty maintaining straight path due to RUE weakness. Mod I in room. PT arranged through [facility].   DM, HIV, HTN   PAIN:  Are you having pain? No  PRECAUTIONS: None  WEIGHT BEARING RESTRICTIONS: No  FALLS: Has patient fallen  in last 6 months? Yes. 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 No acute hemorrhage or mass effect.  IMPRESSION: No large vessel occlusion or proximal hemodynamically significant stenosis.  COGNITION: Overall cognitive status: Within functional limits for tasks assessed   SENSATION: WFL  POSTURE: rounded shoulders, forward head, increased thoracic kyphosis, and flexed trunk   Increased knee valgus  LOWER EXTREMITY ROM:  grossly WFL    LOWER EXTREMITY MMT:    MMT Right Eval Left Eval  Hip flexion 3+ 5  Hip extension    Hip abduction    Hip adduction    Hip internal rotation    Hip external rotation    Knee flexion 4 5  Knee extension 3+ 4  Ankle dorsiflexion    Ankle plantarflexion    Ankle inversion    Ankle eversion    (Blank rows = not tested)   TRANSFERS: Assistive device utilized: Environmental consultant - 4 wheeled  Sit to stand: Min A Stand to sit: Modified independence   GAIT: Gait pattern: step through pattern, decreased stance time- Right, decreased stride length, genu recurvatum- Right, trendelenburg, and narrow BOS Distance walked: in clinic distances Assistive device utilized: Environmental consultant - 4 wheeled Level of assistance: Modified independence Comments: increased genu valgum on bilateral knees   FUNCTIONAL TESTS:  5 times sit to stand: 33.21s Timed up and go (TUG): 21s Berg Balance Scale: 39/56   TODAY'S TREATMENT:  DATE:  06/12/23 Progress note, reviewed tests and  goals  Outside curb navigation  NuStep L5 x4mins  Step up on airex 2 steps but discontinued  5# AW marching  Hip abd/ext 5# AW Step overs at stairs 4"   06/07/23 Walking outdoors with quad cane on grass and around [address], up and down curbs  NuStep L5 x57mins  Leg ext 10# 2x10 HS curls 25# 2x10 Leg press 20# 2x10 Calf raises 2x12 STS with chest press 2x5    06/05/23 NuStep L5 x67mins  Walking with quad cane 2 big laps in gym Calf raises 2x12 Side steps over obstacles  STS on airex with OHP 3x5 Standing on airex heel taps 6" Tandem standing on beam  Stepping and reaching targets behind her  Ankle TB green 20 reps each way R  05/29/23 NuStep L5 x43mins  Walking on beam On airex cone taps in II bars  Volleyball hits standing in bars  Resisted gait 20# total, x4 forwards, x2 sideways  Leg ext 10# 2x8 HS curls 20# 2x10   05/24/23 Walking outdoors half way around [place] and up and down curbs with SPC STS on airex 2x5 Step ups on airex x10 each side modA  Tandem stance 30s  SLS 3s at best both sides  Ball squeezes 2x10 Bridges 2x10 SLR 2x10  Fitter pushes 1 black band Calf raises 2x12   05/22/23 Standing hip flexion 3# 2x10 Hip abd 3# 2x10  Bike L4.5 x5 mins  Standing on airex feet together, then eyes closed 30s  Standing on airex marching 20 reps alt holding on to rollator  STS from blue chair with double airex on top x10 Walking outdoor down sidewalk and up curb to picnic table and back Toe raises 2x10 Calf raises 2x10    05/16/23 NuStep L5 x21mins Calf stretch 30s  Calf raises 2x10  Step ups 6" x5 each side, fatigue and difficulty, tried on 4" instead able to do x10 with fatigue Walking on beam in II bars Tandem standing 10s  Walking laps without AD 2 big laps x2 with rest break in between  ankleTB green 20 reps all ways both ankles   05/15/23 Walking 3 laps w/o AD  STS on airex from elevated mat table x10, pain in knees - CGA Leg ext 10# 2x10, RLE  x5 HS curls 20# 2x10, RLE x10 3# marching 20 reps minA  3# hip abd 2x10  Leg press 20# 2x10  NuStep L5 x31mins    05/08/23 LAQ 2# 2x10 Marching 2# 20 reps alt x2 HS curls green 2x10  STS with red ball chest press 2x10  NuStep L4 x32mins  Walking without AD 1 lap in gym  Step ups 4" at stairs  Standing on airex feet together 25s at best, then eyes closed 10s in // bars  Step up on airex in // bars holding on x5 each side    05/02/23- EVAL   PATIENT EDUCATION: Education details: POC and HEP Person educated: Patient Education method: Explanation Education comprehension: verbalized understanding  HOME EXERCISE PROGRAM: Access Code: [email or web address] URL: [email or web address] Date: 05/02/2023 Prepared by: Cassie Freer  Exercises - Standing Hip Abduction with Counter Support  - 1 x daily - 7 x weekly - 2 sets - 10 reps - Mini Squat with Counter Support  - 1 x daily - 7 x weekly - 2 sets - 10 reps - Standing March with Counter Support  - 1 x daily - 7 x weekly - 2 sets -  10 reps - Standing Tandem Balance with Counter Support  - 2 x daily - 7 x weekly - 10-30 hold - Standing Single Leg Stance with Counter Support  - 2 x daily - 7 x weekly - 10 hold - Seated hip flexion against band - 2 x daily- 7x weekly - 10 reps  - Seated hip abduction against band - 2 x daily- 7x weekly - 10 reps  - Seated knee extension against band - 2 x daily- 7x weekly - 10 reps   GOALS: Goals reviewed with patient? Yes  SHORT TERM GOALS: Target date: 06/13/23  Patient will be independent with initial HEP. Goal status: MET  2.  Patient will demonstrate decreased fall risk by scoring < 15 sec on TUG w/o AD Baseline: 21 with rollator  Goal status: MET 11.65 06/12/23   LONG TERM GOALS: Target date: 07/25/23  Patient will be independent with advanced/ongoing HEP to improve outcomes and carryover.  Goal status: INITIAL  2.  Patient will be able to ambulate 500' with LRAD with good safety to access  community.  Baseline: using rollator Goal status: IN PROGRESS 06/12/23  3.  Patient will be able to step up/down curb safely with LRAD for safety with community ambulation.  Goal status: IN PROGRESS modA at curbs 06/12/23   4.  Patient will demonstrate improved functional LE strength as demonstrated by 5xSTS <15s. Baseline: 33.21s, 17.12s 06/12/23 Goal status: IN PROGRESS  5.  Patient will score 47 on Berg Balance test to demonstrate lower risk of falls. (MCID= 8 points) .  Baseline: 39, 44- 06/12/23 Goal status: IN PROGRESS   ASSESSMENT:  CLINICAL IMPRESSION: Progress note complete. She is progressing towards meeting her goals. She is walking mostly with a quad cane now but is not very steady. Still has difficulty with curbs and stairs. She was a bit more unsteady today on her feet outdoors, walking with quad cane. Tried step ups on airex but patient tripped and stepped on PT's foot, had to be lowered to the floor, did not get hurt. Updated HEP to add in more strengthening for LE's.   OBJECTIVE IMPAIRMENTS: Abnormal gait, decreased balance, difficulty walking, and decreased strength.   REHAB POTENTIAL: Good  CLINICAL DECISION MAKING: Stable/uncomplicated  EVALUATION COMPLEXITY: Low  PLAN:  PT FREQUENCY: 2x/week  PT DURATION: 12 weeks  PLANNED INTERVENTIONS: Therapeutic exercises, Therapeutic activity, Neuromuscular re-education, Balance training, Gait training, Patient/Family education, Self Care, Joint mobilization, Stair training, Dry Needling, Cryotherapy, Moist heat, Ionotophoresis 4mg /ml Dexamethasone, and Manual therapy  PLAN FOR NEXT SESSION: continue to work on LE strengthening and balance, progress note    Cassie Freer, PT 06/12/2023, 9:27 AM

## 2023-06-12 ENCOUNTER — Ambulatory Visit: Payer: Medicaid Other

## 2023-06-12 DIAGNOSIS — I6389 Other cerebral infarction: Secondary | ICD-10-CM

## 2023-06-12 DIAGNOSIS — R2689 Other abnormalities of gait and mobility: Secondary | ICD-10-CM

## 2023-06-12 DIAGNOSIS — R278 Other lack of coordination: Secondary | ICD-10-CM

## 2023-06-12 DIAGNOSIS — M6281 Muscle weakness (generalized): Secondary | ICD-10-CM

## 2023-06-14 ENCOUNTER — Ambulatory Visit: Payer: Medicaid Other

## 2023-06-18 NOTE — Therapy (Signed)
OUTPATIENT PHYSICAL THERAPY NEURO TREATMENT    Patient Name: Latoya Roth MRN: [id number] DOB:21-Aug-1971, 52 y.o., female Today's Date: 06/19/2023   PCP: Jackie Plum REFERRING PROVIDER: Mariam Dollar  END OF SESSION:  PT End of Session - 06/19/23 0926     Visit Number 11    Date for PT Re-Evaluation 07/25/23    PT Start Time 0926    PT Stop Time 1010    PT Time Calculation (min) 44 min                 Past Medical History:  Diagnosis Date   Anxiety    Arthritis    Asthma    Bell's palsy    BV (bacterial vaginosis)    Depression    Diabetes mellitus    HIV disease (HCC)    Hypertension    Obesity    Past Surgical History:  Procedure Laterality Date   CHOLECYSTECTOMY     Patient Active Problem List   Diagnosis Date Noted   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   Depression 04/12/2023   Moderate persistent asthma 04/12/2023   Allergic rhinitis 04/12/2023   Acute ischemic stroke (HCC) 04/11/2023   Counseled about COVID-19 virus infection 01/25/2020   Screening examination for venereal disease 12/31/2017   Encounter for long-term (current) use of high-risk medication 12/31/2017   Medication monitoring encounter 05/06/2017   DM2 (diabetes mellitus, type 2) (HCC) 04/04/2017   HIV (human immunodeficiency virus infection) (HCC) 03/13/2017   Essential hypertension 04/27/2016   Asthma 04/27/2016   BMI 40.0-44.9, adult (HCC) 01/15/2014    ONSET DATE: 04/11/23  REFERRING DIAG:  Diagnosis  I63.89 (ICD-10-CM) - Other cerebral infarction    THERAPY DIAG:  Acute brainstem infarction (HCC)  Muscle weakness (generalized)  Other abnormalities of gait and mobility  Other lack of coordination  Rationale for Evaluation and Treatment: Rehabilitation  SUBJECTIVE:                                                                                                                                                                                              SUBJECTIVE STATEMENT: Doing good, mostly walking with the cane. Need help with curbs unless there is a car or something to hold on to.   PERTINENT HISTORY: Latoya Roth is a 52 y.o. female who presented to the ED on 04/11/2023 complaining of right-sided weakness and right-facial droop that started four days prior to presentation. Head CT obtained with strong suspicion for a pontine infarct which was followed by an MRI that confirmed a subacute left paramedian pons infarct. Neurology was consulted for stroke workup. Does  not use tobacco. Drinks about 2 alcoholic servings per day. Medical history significant for hypertension, HIV on HARRT therapy, DM-2. Vitals were relatively stable. Labs showed sodium slightly low at 135. Urine drug screen was negative. Urinalysis was negative as well. EKG showed normal sinus rhythm. Hgb A1c 8.0%. LDL 92. 2D echo 55-60%. Plavix and aspirin started. Continue for 3 weeks then aspirin alone. Diabetic coordinator consulted. Lovenox for DVT prophy. She is requiring mod A to prevent loss of balance. Tolerating carb modified diet.   Latoya Roth was admitted to rehab 04/16/2023 for inpatient therapies to consist of PT, ST and OT at least three hours five days a week. Past admission physiatrist, therapy team and rehab RN have worked together to provide customized collaborative inpatient rehab. She has had improvement in functional use RUE/LUE  and RLE/LLE as well as improvement in awareness. 4/30 . Patient completed stand pivot transfer with supervision and no AD to wheelchair. Propelled herself in w/c ~127ft with supervision using BUE - difficulty maintaining straight path due to RUE weakness. Mod I in room. PT arranged through [facility].   DM, HIV, HTN   PAIN:  Are you having pain? No  PRECAUTIONS: None  WEIGHT BEARING RESTRICTIONS: No  FALLS: Has patient fallen in last 6 months? Yes. Number of falls 1  LIVING ENVIRONMENT: Lives  with: lives alone Lives in: House/apartment Stairs: No Has following equipment at home: Single point cane, Quad cane small base, and Walker - 4 wheeled  PLOF: Independent with basic ADLs and Independent with household mobility with device  PATIENT GOALS: to go back to normal   OBJECTIVE:   DIAGNOSTIC FINDINGS:  IMPRESSION: Acute infarct of the left paramedian pons. No acute hemorrhage or mass effect.  IMPRESSION: No large vessel occlusion or proximal hemodynamically significant stenosis.  COGNITION: Overall cognitive status: Within functional limits for tasks assessed   SENSATION: WFL  POSTURE: rounded shoulders, forward head, increased thoracic kyphosis, and flexed trunk   Increased knee valgus  LOWER EXTREMITY ROM:  grossly WFL    LOWER EXTREMITY MMT:    MMT Right Eval Left Eval  Hip flexion 3+ 5  Hip extension    Hip abduction    Hip adduction    Hip internal rotation    Hip external rotation    Knee flexion 4 5  Knee extension 3+ 4  Ankle dorsiflexion    Ankle plantarflexion    Ankle inversion    Ankle eversion    (Blank rows = not tested)   TRANSFERS: Assistive device utilized: Environmental consultant - 4 wheeled  Sit to stand: Min A Stand to sit: Modified independence   GAIT: Gait pattern: step through pattern, decreased stance time- Right, decreased stride length, genu recurvatum- Right, trendelenburg, and narrow BOS Distance walked: in clinic distances Assistive device utilized: Environmental consultant - 4 wheeled Level of assistance: Modified independence Comments: increased genu valgum on bilateral knees   FUNCTIONAL TESTS:  5 times sit to stand: 33.21s Timed up and go (TUG): 21s Berg Balance Scale: 39/56   TODAY'S TREATMENT:  DATE:  06/19/23 NuStep L5 x18mins  Leg ext 10# 2x10, RLE x10  HS curls 25# 2x10, RLE x10  Calf raises on step 2x12   Step ups on airex in II bars Side steps on airex in II bars  On airex feet together, then eyes closed  STS 2x10  06/12/23 Progress note, reviewed tests and goals  Outside curb navigation  NuStep L5 x76mins  Step up on airex 2 steps but discontinued  5# AW marching  Hip abd/ext 5# AW Step overs at stairs 4"   06/07/23 Walking outdoors with quad cane on grass and around [address], up and down curbs  NuStep L5 x62mins  Leg ext 10# 2x10 HS curls 25# 2x10 Leg press 20# 2x10 Calf raises 2x12 STS with chest press 2x5    06/05/23 NuStep L5 x87mins  Walking with quad cane 2 big laps in gym Calf raises 2x12 Side steps over obstacles  STS on airex with OHP 3x5 Standing on airex heel taps 6" Tandem standing on beam  Stepping and reaching targets behind her  Ankle TB green 20 reps each way R  05/29/23 NuStep L5 x52mins  Walking on beam On airex cone taps in II bars  Volleyball hits standing in bars  Resisted gait 20# total, x4 forwards, x2 sideways  Leg ext 10# 2x8 HS curls 20# 2x10   05/24/23 Walking outdoors half way around [place] and up and down curbs with SPC STS on airex 2x5 Step ups on airex x10 each side modA  Tandem stance 30s  SLS 3s at best both sides  Ball squeezes 2x10 Bridges 2x10 SLR 2x10  Fitter pushes 1 black band Calf raises 2x12   05/22/23 Standing hip flexion 3# 2x10 Hip abd 3# 2x10  Bike L4.5 x5 mins  Standing on airex feet together, then eyes closed 30s  Standing on airex marching 20 reps alt holding on to rollator  STS from blue chair with double airex on top x10 Walking outdoor down sidewalk and up curb to picnic table and back Toe raises 2x10 Calf raises 2x10    05/16/23 NuStep L5 x3mins Calf stretch 30s  Calf raises 2x10  Step ups 6" x5 each side, fatigue and difficulty, tried on 4" instead able to do x10 with fatigue Walking on beam in II bars Tandem standing 10s  Walking laps without AD 2 big laps x2 with rest break in between   ankleTB green 20 reps all ways both ankles   05/15/23 Walking 3 laps w/o AD  STS on airex from elevated mat table x10, pain in knees - CGA Leg ext 10# 2x10, RLE x5 HS curls 20# 2x10, RLE x10 3# marching 20 reps minA  3# hip abd 2x10  Leg press 20# 2x10  NuStep L5 x78mins    05/08/23 LAQ 2# 2x10 Marching 2# 20 reps alt x2 HS curls green 2x10  STS with red ball chest press 2x10  NuStep L4 x39mins  Walking without AD 1 lap in gym  Step ups 4" at stairs  Standing on airex feet together 25s at best, then eyes closed 10s in // bars  Step up on airex in // bars holding on x5 each side    05/02/23- EVAL   PATIENT EDUCATION: Education details: POC and HEP Person educated: Patient Education method: Explanation Education comprehension: verbalized understanding  HOME EXERCISE PROGRAM: Access Code: [email or web address] URL: [email or web address] Date: 05/02/2023 Prepared by: Cassie Freer  Exercises - Standing Hip Abduction with Counter Support  - 1 x  daily - 7 x weekly - 2 sets - 10 reps - Mini Squat with Counter Support  - 1 x daily - 7 x weekly - 2 sets - 10 reps - Standing March with Counter Support  - 1 x daily - 7 x weekly - 2 sets - 10 reps - Standing Tandem Balance with Counter Support  - 2 x daily - 7 x weekly - 10-30 hold - Standing Single Leg Stance with Counter Support  - 2 x daily - 7 x weekly - 10 hold - Seated hip flexion against band - 2 x daily- 7x weekly - 10 reps  - Seated hip abduction against band - 2 x daily- 7x weekly - 10 reps  - Seated knee extension against band - 2 x daily- 7x weekly - 10 reps   GOALS: Goals reviewed with patient? Yes  SHORT TERM GOALS: Target date: 06/13/23  Patient will be independent with initial HEP. Goal status: MET  2.  Patient will demonstrate decreased fall risk by scoring < 15 sec on TUG w/o AD Baseline: 21 with rollator  Goal status: MET 11.65 06/12/23   LONG TERM GOALS: Target date: 07/25/23  Patient will be  independent with advanced/ongoing HEP to improve outcomes and carryover.  Goal status: INITIAL  2.  Patient will be able to ambulate 500' with LRAD with good safety to access community.  Baseline: using rollator Goal status: IN PROGRESS 06/12/23  3.  Patient will be able to step up/down curb safely with LRAD for safety with community ambulation.  Goal status: IN PROGRESS modA at curbs 06/12/23   4.  Patient will demonstrate improved functional LE strength as demonstrated by 5xSTS <15s. Baseline: 33.21s, 17.12s 06/12/23 Goal status: IN PROGRESS  5.  Patient will score 47 on Berg Balance test to demonstrate lower risk of falls. (MCID= 8 points) .  Baseline: 39, 44- 06/12/23 Goal status: IN PROGRESS   ASSESSMENT:  CLINICAL IMPRESSION: Patient continues walking mostly with a quad cane now but is not very steady. Still has difficulty with curbs and stairs. Revisited balance tasks today but opted to do them in the parallel bars for safety reasons. Fatigue half way through session, reports her legs are tired and needs a longer rest break. Reports compliance with HEP, hardest one in tandem standing at counter. Good progress with STS today, able to do from lower surface and complete sets of 10.    OBJECTIVE IMPAIRMENTS: Abnormal gait, decreased balance, difficulty walking, and decreased strength.   REHAB POTENTIAL: Good  CLINICAL DECISION MAKING: Stable/uncomplicated  EVALUATION COMPLEXITY: Low  PLAN:  PT FREQUENCY: 2x/week  PT DURATION: 12 weeks  PLANNED INTERVENTIONS: Therapeutic exercises, Therapeutic activity, Neuromuscular re-education, Balance training, Gait training, Patient/Family education, Self Care, Joint mobilization, Stair training, Dry Needling, Cryotherapy, Moist heat, Ionotophoresis 4mg /ml Dexamethasone, and Manual therapy  PLAN FOR NEXT SESSION: continue to work on LE strengthening and balance, progress note    [employer], PT 06/19/2023, 10:10 AM

## 2023-06-19 ENCOUNTER — Ambulatory Visit: Payer: Medicaid Other

## 2023-06-19 DIAGNOSIS — R2689 Other abnormalities of gait and mobility: Secondary | ICD-10-CM

## 2023-06-19 DIAGNOSIS — R278 Other lack of coordination: Secondary | ICD-10-CM

## 2023-06-19 DIAGNOSIS — I6389 Other cerebral infarction: Secondary | ICD-10-CM | POA: Diagnosis not present

## 2023-06-19 DIAGNOSIS — M6281 Muscle weakness (generalized): Secondary | ICD-10-CM

## 2023-06-21 ENCOUNTER — Ambulatory Visit: Payer: Medicaid Other

## 2023-07-01 NOTE — Therapy (Signed)
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   Depression 04/12/2023   Moderate persistent asthma 04/12/2023   Allergic rhinitis 04/12/2023   Acute ischemic stroke (HCC) 04/11/2023   Counseled about COVID-19 virus infection 01/25/2020   Screening examination for venereal disease 12/31/2017   Encounter for long-term (current) use of high-risk medication 12/31/2017   Medication monitoring encounter 05/06/2017   DM2 (diabetes mellitus, type 2) (HCC) 04/04/2017   HIV (human immunodeficiency virus infection) (HCC) 03/13/2017   Essential hypertension 04/27/2016   Asthma 04/27/2016   BMI 40.0-44.9, adult (HCC) 01/15/2014    ONSET DATE: 04/11/23  REFERRING DIAG:  Diagnosis  I63.89 (ICD-10-CM) - Other cerebral infarction    THERAPY DIAG:  No diagnosis found.  Rationale for Evaluation and Treatment: Rehabilitation  SUBJECTIVE:                                                                                                                                                                                             SUBJECTIVE STATEMENT: Doing good, mostly walking with the cane. Need help with curbs unless there is a car or something to hold on to.   PERTINENT HISTORY: Latoya Roth is a 52 y.o. female who presented to the ED on 04/11/2023 complaining of right-sided weakness and right-facial droop that  started four days prior to presentation. Head CT obtained with strong suspicion for a pontine infarct which was followed by an MRI that confirmed a subacute left paramedian pons infarct. Neurology was consulted for stroke workup. Does not use tobacco. Drinks about 2 alcoholic servings per day. Medical history significant for hypertension, HIV on HARRT therapy, DM-2. Vitals were relatively stable. Labs showed sodium slightly low at 135. Urine drug screen was negative. Urinalysis was negative as well. EKG showed normal sinus rhythm. Hgb A1c 8.0%. LDL 92. 2D echo 55-60%. Plavix and aspirin started. Continue for 3 weeks then aspirin alone. Diabetic coordinator consulted. Lovenox for DVT prophy.  She is requiring mod A to prevent loss of balance. Tolerating carb modified diet.   [name] was admitted to rehab 04/16/2023 for inpatient therapies to consist of PT, ST and OT at least three hours five days a week. Past admission physiatrist, therapy team and rehab RN have worked together to provide customized collaborative inpatient rehab. She has had improvement in functional use RUE/LUE  and RLE/LLE as well as improvement in awareness. 4/30 . Patient completed stand pivot transfer with supervision and no AD to wheelchair. Propelled herself in w/c ~168ft with supervision using BUE - difficulty maintaining straight path due to RUE weakness. Mod I in room. PT arranged through [facility].   DM, HIV, HTN   PAIN:  Are you having pain? No  PRECAUTIONS: None  WEIGHT BEARING RESTRICTIONS: No  FALLS: Has patient fallen in last 6 months? Yes. Number of falls 1  LIVING ENVIRONMENT: Lives with: lives alone Lives in: House/apartment Stairs: No Has following equipment at home: Single point cane, Quad cane small base, and Walker - 4 wheeled  PLOF: Independent with basic ADLs and Independent with household mobility with device  PATIENT GOALS: to go back to normal   OBJECTIVE:   DIAGNOSTIC  FINDINGS:  IMPRESSION: Acute infarct of the left paramedian pons. No acute hemorrhage or mass effect.  IMPRESSION: No large vessel occlusion or proximal hemodynamically significant stenosis.  COGNITION: Overall cognitive status: Within functional limits for tasks assessed   SENSATION: WFL  POSTURE: rounded shoulders, forward head, increased thoracic kyphosis, and flexed trunk   Increased knee valgus  LOWER EXTREMITY ROM:  grossly WFL    LOWER EXTREMITY MMT:    MMT Right Eval Left Eval  Hip flexion 3+ 5  Hip extension    Hip abduction    Hip adduction    Hip internal rotation    Hip external rotation    Knee flexion 4 5  Knee extension 3+ 4  Ankle dorsiflexion    Ankle plantarflexion    Ankle inversion    Ankle eversion    (Blank rows = not tested)   TRANSFERS: Assistive device utilized: Environmental consultant - 4 wheeled  Sit to stand: Min A Stand to sit: Modified independence   GAIT: Gait pattern: step through pattern, decreased stance time- Right, decreased stride length, genu recurvatum- Right, trendelenburg, and narrow BOS Distance walked: in clinic distances Assistive device utilized: Environmental consultant - 4 wheeled Level of assistance: Modified independence Comments: increased genu valgum on bilateral knees   FUNCTIONAL TESTS:  5 times sit to stand: 33.21s Timed up and go (TUG): 21s Berg Balance Scale: 39/56   TODAY'S TREATMENT:                                                                                                                              DATE:  07/03/23 NuStep Step ups  Shoulder ext  Leg press On airex 6" heel taps  Calf raises on airex  Walking outdoors    06/19/23 NuStep L5 x6mins  Leg ext 10# 2x10, RLE x10  HS curls 25# 2x10, RLE x10  Calf raises on step 2x12  Step ups on airex in II bars Side steps on airex in II bars  On airex feet together, then eyes closed  STS 2x10  06/12/23 Progress note, reviewed tests and goals  Outside curb navigation   NuStep L5 x2mins  Step up on airex 2 steps but discontinued  5# AW marching  Hip abd/ext 5# AW Step overs at stairs 4"   06/07/23 Walking outdoors with quad cane on grass and around [address], up and down curbs  NuStep L5 x61mins  Leg ext 10# 2x10 HS curls 25# 2x10 Leg press 20# 2x10 Calf raises 2x12 STS with chest press 2x5    06/05/23 NuStep L5 x15mins  Walking with quad cane 2 big laps in gym Calf raises 2x12 Side steps over obstacles  STS on airex with OHP 3x5 Standing on airex heel taps 6" Tandem standing on beam  Stepping and reaching targets behind her  Ankle TB green 20 reps each way R  05/29/23 NuStep L5 x73mins  Walking on beam On airex cone taps in II bars  Volleyball hits standing in bars  Resisted gait 20# total, x4 forwards, x2 sideways  Leg ext 10# 2x8 HS curls 20# 2x10   05/24/23 Walking outdoors half way around [place] and up and down curbs with SPC STS on airex 2x5 Step ups on airex x10 each side modA  Tandem stance 30s  SLS 3s at best both sides  Ball squeezes 2x10 Bridges 2x10 SLR 2x10  Fitter pushes 1 black band Calf raises 2x12   05/22/23 Standing hip flexion 3# 2x10 Hip abd 3# 2x10  Bike L4.5 x5 mins  Standing on airex feet together, then eyes closed 30s  Standing on airex marching 20 reps alt holding on to rollator  STS from blue chair with double airex on top x10 Walking outdoor down sidewalk and up curb to picnic table and back Toe raises 2x10 Calf raises 2x10    05/16/23 NuStep L5 x53mins Calf stretch 30s  Calf raises 2x10  Step ups 6" x5 each side, fatigue and difficulty, tried on 4" instead able to do x10 with fatigue Walking on beam in II bars Tandem standing 10s  Walking laps without AD 2 big laps x2 with rest break in between  ankleTB green 20 reps all ways both ankles   05/15/23 Walking 3 laps w/o AD  STS on airex from elevated mat table x10, pain in knees - CGA Leg ext 10# 2x10, RLE x5 HS curls 20# 2x10, RLE  x10 3# marching 20 reps minA  3# hip abd 2x10  Leg press 20# 2x10  NuStep L5 x46mins    05/08/23 LAQ 2# 2x10 Marching 2# 20 reps alt x2 HS curls green 2x10  STS with red ball chest press 2x10  NuStep L4 x43mins  Walking without AD 1 lap in gym  Step ups 4" at stairs  Standing on airex feet together 25s at best, then eyes closed 10s in // bars  Step up on airex in // bars holding on x5 each side    05/02/23- EVAL   PATIENT EDUCATION: Education details: POC and HEP Person educated: Patient Education method: Explanation Education comprehension: verbalized understanding  HOME EXERCISE PROGRAM: Access Code: [email or web address] URL: [email or web address] Date: 05/02/2023 Prepared by: Cassie Freer  Exercises - Standing Hip Abduction with Counter Support  -  1 x daily - 7 x weekly - 2 sets - 10 reps - Mini Squat with Counter Support  - 1 x daily - 7 x weekly - 2 sets - 10 reps - Standing March with Counter Support  - 1 x daily - 7 x weekly - 2 sets - 10 reps - Standing Tandem Balance with Counter Support  - 2 x daily - 7 x weekly - 10-30 hold - Standing Single Leg Stance with Counter Support  - 2 x daily - 7 x weekly - 10 hold - Seated hip flexion against band - 2 x daily- 7x weekly - 10 reps  - Seated hip abduction against band - 2 x daily- 7x weekly - 10 reps  - Seated knee extension against band - 2 x daily- 7x weekly - 10 reps   GOALS: Goals reviewed with patient? Yes  SHORT TERM GOALS: Target date: 06/13/23  Patient will be independent with initial HEP. Goal status: MET  2.  Patient will demonstrate decreased fall risk by scoring < 15 sec on TUG w/o AD Baseline: 21 with rollator  Goal status: MET 11.65 06/12/23   LONG TERM GOALS: Target date: 07/25/23  Patient will be independent with advanced/ongoing HEP to improve outcomes and carryover.  Goal status: INITIAL  2.  Patient will be able to ambulate 500' with LRAD with good safety to access community.  Baseline:  using rollator Goal status: IN PROGRESS 06/12/23  3.  Patient will be able to step up/down curb safely with LRAD for safety with community ambulation.  Goal status: IN PROGRESS modA at curbs 06/12/23   4.  Patient will demonstrate improved functional LE strength as demonstrated by 5xSTS <15s. Baseline: 33.21s, 17.12s 06/12/23 Goal status: IN PROGRESS  5.  Patient will score 47 on Berg Balance test to demonstrate lower risk of falls. (MCID= 8 points) .  Baseline: 39, 44- 06/12/23 Goal status: IN PROGRESS   ASSESSMENT:  CLINICAL IMPRESSION: Patient continues walking mostly with a quad cane now but is not very steady. Still has difficulty with curbs and stairs. Revisited balance tasks today but opted to do them in the parallel bars for safety reasons. Fatigue half way through session, reports her legs are tired and needs a longer rest break. Reports compliance with HEP, hardest one in tandem standing at counter. Good progress with STS today, able to do from lower surface and complete sets of 10.    OBJECTIVE IMPAIRMENTS: Abnormal gait, decreased balance, difficulty walking, and decreased strength.   REHAB POTENTIAL: Good  CLINICAL DECISION MAKING: Stable/uncomplicated  EVALUATION COMPLEXITY: Low  PLAN:  PT FREQUENCY: 2x/week  PT DURATION: 12 weeks  PLANNED INTERVENTIONS: Therapeutic exercises, Therapeutic activity, Neuromuscular re-education, Balance training, Gait training, Patient/Family education, Self Care, Joint mobilization, Stair training, Dry Needling, Cryotherapy, Moist heat, Ionotophoresis 4mg /ml Dexamethasone, and Manual therapy  PLAN FOR NEXT SESSION: continue to work on LE strengthening and balance, progress note    Cassie Freer, PT 07/01/2023, 5:11 PM

## 2023-07-02 ENCOUNTER — Encounter
Payer: Medicaid Other | Attending: Physical Medicine and Rehabilitation | Admitting: Physical Medicine and Rehabilitation

## 2023-07-02 VITALS — BP 139/81 | HR 69 | Ht 67.0 in | Wt 255.0 lb

## 2023-07-02 DIAGNOSIS — Z6841 Body Mass Index (BMI) 40.0 and over, adult: Secondary | ICD-10-CM | POA: Diagnosis present

## 2023-07-02 DIAGNOSIS — I639 Cerebral infarction, unspecified: Secondary | ICD-10-CM | POA: Diagnosis present

## 2023-07-02 DIAGNOSIS — F1020 Alcohol dependence, uncomplicated: Secondary | ICD-10-CM | POA: Diagnosis present

## 2023-07-02 DIAGNOSIS — Z794 Long term (current) use of insulin: Secondary | ICD-10-CM

## 2023-07-02 DIAGNOSIS — E119 Type 2 diabetes mellitus without complications: Secondary | ICD-10-CM | POA: Insufficient documentation

## 2023-07-02 MED ORDER — TOPIRAMATE 25 MG PO TABS: 25.0000 mg | ORAL_TABLET | Freq: Every evening | ORAL | 3 refills | Status: DC

## 2023-07-02 NOTE — Progress Notes (Signed)
Subjective:    Patient ID: Latoya Roth, female    DOB: 07-21-1971, 52 y.o.   MRN: [id number]  HPI Latoya Roth is a 52 year old woman who presents for follow-up of CVA, type 2 diabetes and obesity.  1) Type 2 DM -patient does not know how sugars have been running as she has not been checking them -she has been taking short acting insulin -CBGs are running 130s-150s -she has been taking 5 units insulin  2) HTN -BP is elevated  3) CVA -she has been receiving therapy and improving -continues to use the RW -she walks with her cane sometimes  4) Obesity: -she thinks she can get rid of sprite  Pain Inventory Average Pain 4 Pain Right Now 4 My pain is  intermittent aching  BOWEL Number of stools per week: 3  BLADDER Normal  Mobility walk with assistance use a walker do you drive?  no  Function not employed: date last employed .  Neuro/Psych trouble walking  Prior Studies Any changes since last visit?  no  Physicians involved in your care Any changes since last visit?  no   saw PCP on Wednesday 05/01/23   Family History  Problem Relation Age of Onset   Hypertension Mother    Cancer Son    Social History   Socioeconomic History   Marital status: Legally Separated    Spouse name: Not on file   Number of children: Not on file   Years of education: Not on file   Highest education level: Not on file  Occupational History   Not on file  Tobacco Use   Smoking status: Former   Smokeless tobacco: Never  Substance and Sexual Activity   Alcohol use: Yes    Alcohol/week: 14.0 standard drinks of alcohol    Types: 14 Cans of beer per week    Comment: 2/day   Drug use: No   Sexual activity: Yes    Partners: Male    Birth control/protection: Condom    Comment: accepted condoms  Other Topics Concern   Not on file  Social History Narrative   Not on file   Social Determinants of Health   Financial Resource Strain: Not on file  Food Insecurity: Food  Insecurity Present (04/11/2023)   Hunger Vital Sign    Worried About Running Out of Food in the Last Year: Often true    Ran Out of Food in the Last Year: Often true  Transportation Needs: No Transportation Needs (04/11/2023)   PRAPARE - Administrator, Civil Service (Medical): No    Lack of Transportation (Non-Medical): No  Physical Activity: Not on file  Stress: Not on file  Social Connections: Not on file   Past Surgical History:  Procedure Laterality Date   CHOLECYSTECTOMY     Past Medical History:  Diagnosis Date   Anxiety    Arthritis    Asthma    Bell's palsy    BV (bacterial vaginosis)    Depression    Diabetes mellitus    HIV disease (HCC)    Hypertension    Obesity    There were no vitals taken for this visit. BP recheck 133/82 Opioid Risk Score:   Fall Risk Score:  `1  Depression screen [facility] 2/9     05/03/2023    9:19 AM 03/21/2023   11:14 AM 03/22/2022   10:13 AM 08/08/2020   10:09 AM 08/13/2017   11:08 AM 04/04/2017    9:29 AM  03/13/2017    3:24 PM  Depression screen PHQ 2/9  Decreased Interest 2 0 0 0 1 1 3   Down, Depressed, Hopeless 0 0 0 1 1 1 3   PHQ - 2 Score 2 0 0 1 2 2 6   Altered sleeping 2    0 2 2  Tired, decreased energy 0    0 1 1  Change in appetite 0    1 1 1   Feeling bad or failure about yourself  0    0 1 1  Trouble concentrating 0    0 1 2  Moving slowly or fidgety/restless 0    1 1 1   Suicidal thoughts 0    0 0 0  PHQ-9 Score 4    4 9 14   Difficult doing work/chores     Somewhat difficult Somewhat difficult Very difficult     Review of Systems  Constitutional: Negative.   HENT: Negative.    Eyes: Negative.   Respiratory: Negative.    Cardiovascular: Negative.   Gastrointestinal: Negative.   Endocrine: Negative.   Genitourinary: Negative.   Musculoskeletal:  Positive for back pain and gait problem.  Skin: Negative.   Hematological: Negative.   Psychiatric/Behavioral:  Positive for dysphoric mood.   All other systems  reviewed and are negative.      Objective:   Physical Exam Gen: no distress, normal appearing, weight 255 lbs, BMI 29.94 HEENT: oral mucosa pink and moist, NCAT Cardio: Reg rate Chest: normal effort, normal rate of breathing Abd: soft, non-distended Ext: no edema Psych: pleasant, normal affect Skin: intact Neuro: Alert and oriented x3 MSK: ambulating with RW     Assessment & Plan:   1) Diabetes: -Dexcom ordered -encouraged walking after meals -continue insulin, decrease to 4U -AOZHYQ prescribed -avoid sugar, bread, pasta, rice -avoid snacking -perform daily foot exam and at least annual eye exam -try to incorporate into your diet some of the following foods which are good for diabetes: 1) cinnamon- imitates effects of insulin, increasing glucose transport into cells ([place] or [place] cinnamon is best, least processed) 2) nuts- can slow down the blood sugar response of carbohydrate rich foods 3) oatmeal- contains and anti-inflammatory compound avenanthramide 4) whole-milk yogurt (best types are no sugar, [place] yogurt, or goat/sheep yogurt) 5) beans- high in protein, fiber, and vitamins, low glycemic index 6) broccoli- great source of vitamin A and C 7) quinoa- higher in protein and fiber than other grains 8) spinach- high in vitamin A, fiber, and protein 9) olive oil- reduces glucose levels, LDL, and triglycerides 10) salmon- excellent amount of omega-3-fatty acids 11) walnuts- rich in antioxidants 12) apples- high in fiber and quercetin 13) carrots- highly nutritious with low impact on blood sugar 14) eggs- improve HDL (good cholesterol), high in protein, keep you satiated 15) turmeric: improves blood sugars, cardiovascular disease, and protects kidney health 16) garlic: improves blood sugar, blood pressure, pain 17) tomatoes: highly nutritious with low impact on blood sugar   2) HTN: -BP is 139/81 -Advised checking BP daily at home and logging results to bring  into follow-up appointment with PCP and myself. -Reviewed BP meds today.  -Advised regarding healthy foods that can help lower blood pressure and provided with a list: 1) citrus foods- high in vitamins and minerals 2) salmon and other fatty fish - reduces inflammation and oxylipins 3) swiss chard (leafy green)- high level of nitrates 4) pumpkin seeds- one of the best natural sources of magnesium 5) Beans and lentils- high in fiber,  magnesium, and potassium 6) Berries- high in flavonoids 7) Amaranth (whole grain, can be cooked similarly to rice and oats)- high in magnesium and fiber 8) Pistachios- even more effective at reducing BP than other nuts 9) Carrots- high in phenolic compounds that relax blood vessels and reduce inflammation 10) Celery- contain phthalides that relax tissues of arterial walls 11) Tomatoes- can also improve cholesterol and reduce risk of heart disease 12) Broccoli- good source of magnesium, calcium, and potassium 13) Greek yogurt: high in potassium and calcium 14) Herbs and spices: Celery seed, cilantro, saffron, lemongrass, black cumin, ginseng, cinnamon, cardamom, sweet basil, and ginger 15) Chia and flax seeds- also help to lower cholesterol and blood sugar 16) Beets- high levels of nitrates that relax blood vessels  17) spinach and bananas- high in potassium  -Provided lise of supplements that can help with hypertension:  1) magnesium: one high quality brand is Bioptemizers since it contains all 7 types of magnesium, otherwise over the counter magnesium gluconate 400mg  is a good option 2) B vitamins 3) vitamin D 4) potassium 5) CoQ10 6) L-arginine 7) Vitamin C 8) Beetroot -Educated that goal BP is 120/80. -Made goal to incorporate some of the above foods into diet.    3) CVA -continue therapies -continue RW -discussed that she is not getting out of the house much since it is too hot and her son is at work so can't walk with hi in the mall.   4)  Headaches: -prescribed topamax to 25mg  HS  5) Alcoholism:  -advised eliminating alcohol

## 2023-07-03 ENCOUNTER — Ambulatory Visit: Payer: Medicaid Other | Attending: Internal Medicine

## 2023-07-03 DIAGNOSIS — M6281 Muscle weakness (generalized): Secondary | ICD-10-CM | POA: Insufficient documentation

## 2023-07-03 DIAGNOSIS — R278 Other lack of coordination: Secondary | ICD-10-CM | POA: Diagnosis present

## 2023-07-03 DIAGNOSIS — R2689 Other abnormalities of gait and mobility: Secondary | ICD-10-CM | POA: Diagnosis present

## 2023-07-03 DIAGNOSIS — I6389 Other cerebral infarction: Secondary | ICD-10-CM | POA: Diagnosis present

## 2023-07-04 ENCOUNTER — Ambulatory Visit: Payer: Medicaid Other | Admitting: Adult Health

## 2023-07-04 ENCOUNTER — Encounter: Payer: Self-pay | Admitting: Adult Health

## 2023-07-04 VITALS — BP 140/98 | Ht 67.0 in | Wt 251.0 lb

## 2023-07-04 DIAGNOSIS — I639 Cerebral infarction, unspecified: Secondary | ICD-10-CM | POA: Diagnosis not present

## 2023-07-04 DIAGNOSIS — G8191 Hemiplegia, unspecified affecting right dominant side: Secondary | ICD-10-CM | POA: Diagnosis not present

## 2023-07-04 NOTE — Progress Notes (Signed)
[facility] [address] [place]. [place] [address] [phone]       HOSPITAL FOLLOW UP NOTE  Ms. [name] Date of Birth:  [birth year] Medical Record Number:  [id number]   Reason for Referral:  hospital stroke follow up    SUBJECTIVE:   CHIEF COMPLAINT:  Chief Complaint  Patient presents with   New Patient (Initial Visit)    Patient in room #2 and alone. Patient states she feel weakness in her right leg.    HPI:   [name] is a 52 y.o. female with history of hypertension, diabetes, HIV, left Bell's palsy, former smoker, and obesity who presented to [facility] ED on 04/11/2023 with right-sided weakness, right facial droop, headache, wheezing and slurred speech.  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  CTA head/neck and 2D echo unremarkable.  LDL 92.  A1c 8.0.  Recommended DAPT for 3 weeks and aspirin alone and Crestor 20 mg daily.  Today, 07/04/2023, patient is being seen for initial hospital follow-up unaccompanied.  She was initially discharged to CIR for 8 days then discharged home.  Reports residual right leg weakness with gait impairment.  Does complain of some right-sided low back pain and occasional right shoulder pain.  Ambulating with cane but will use rollator at times.  Denies any recent falls.  Currently working with PT with gradual improvement.  Denies any other residual deficits. Denies new stroke/TIA symptoms.  Routinely follows with PMR.  Completed 3 weeks DAPT and continues on aspirin alone as well as Crestor without side effects. Monitors blood pressure and has been around 140s/90s. Glucose levels checked once per day and has been satisfactory per patient.  Routinely follows with PCP.    PERTINENT IMAGING  CT likely punctate infarct MRI left pontine infarct CTA head and neck unremarkable 2D Echo EF 55 to 60% LDL 92 HgbA1c 8.0    ROS:   14 system review of systems performed and  negative with exception of those listed in HPI  PMH:  Past Medical History:  Diagnosis Date   Anxiety    Arthritis    Asthma    Bell's palsy    BV (bacterial vaginosis)    Depression    Diabetes mellitus    HIV disease (HCC)    Hypertension    Obesity     PSH:  Past Surgical History:  Procedure Laterality Date   CHOLECYSTECTOMY      Social History:  Social History   Socioeconomic History   Marital status: Legally Separated    Spouse name: Not on file   Number of children: Not on file   Years of education: Not on file   Highest education level: Not on file  Occupational History   Not on file  Tobacco Use   Smoking status: Former   Smokeless tobacco: Never  Substance and Sexual Activity   Alcohol use: Yes    Alcohol/week: 14.0 standard drinks of alcohol    Types: 14 Cans of beer per week    Comment: 2/day   Drug use: No   Sexual activity: Yes    Partners: Male    Birth control/protection: Condom    Comment: accepted condoms  Other Topics Concern   Not on file  Social History Narrative   Not on file   Social Determinants of Health   Financial Resource Strain: Not on file  Food Insecurity: Food Insecurity Present (04/11/2023)   Hunger Vital Sign  Worried About Programme researcher, broadcasting/film/video in the Last Year: Often true    Ran Out of Food in the Last Year: Often true  Transportation Needs: No Transportation Needs (04/11/2023)   PRAPARE - Administrator, Civil Service (Medical): No    Lack of Transportation (Non-Medical): No  Physical Activity: Not on file  Stress: Not on file  Social Connections: Not on file  Intimate Partner Violence: Not At Risk (04/11/2023)   Humiliation, Afraid, Rape, and Kick questionnaire    Fear of Current or Ex-Partner: No    Emotionally Abused: No    Physically Abused: No    Sexually Abused: No    Family History:  Family History  Problem Relation Age of Onset   Hypertension Mother    Cancer Son     Medications:    Current Outpatient Medications on File Prior to Visit  Medication Sig Dispense Refill   acetaminophen (TYLENOL) 325 MG tablet Take 1-2 tablets (325-650 mg total) by mouth every 4 (four) hours as needed for mild pain.     albuterol-ipratropium (COMBIVENT) 18-103 MCG/ACT inhaler Inhale 2 puffs into the lungs every 6 (six) hours as needed for shortness of breath. Reported on 04/27/2016     aspirin EC 81 MG tablet Take 1 tablet (81 mg total) by mouth daily. Swallow whole. 30 tablet 0   benzonatate (TESSALON) 100 MG capsule Take 1 capsule (100 mg total) by mouth 2 (two) times daily as needed for cough. 20 capsule 0   bictegravir-emtricitabine-tenofovir AF (BIKTARVY) 50-200-25 MG TABS tablet Take 1 tablet by mouth daily. 30 tablet 11   budesonide-formoterol (SYMBICORT) 160-4.5 MCG/ACT inhaler Inhale 2 puffs into the lungs 2 (two) times daily.     cetirizine (ZYRTEC) 10 MG tablet Take 10 mg by mouth daily.     citalopram (CELEXA) 20 MG tablet Take 1 tablet (20 mg total) by mouth daily. 30 tablet 0   Continuous Glucose Receiver (DEXCOM G7 RECEIVER) DEVI 1 each by Does not apply route daily. 1 each 0   Continuous Glucose Sensor (DEXCOM G7 SENSOR) MISC 1 each by Does not apply route daily. 1 each 0   fluticasone (FLONASE) 50 MCG/ACT nasal spray Place 1 spray into both nostrils daily as needed for allergies or rhinitis.  2   guaiFENesin-dextromethorphan (ROBITUSSIN DM) 100-10 MG/5ML syrup Take 5-10 mLs by mouth every 6 (six) hours as needed for cough. 118 mL 0   insulin aspart (NOVOLOG) 100 UNIT/ML FlexPen Inject 5 Units into the skin 3 (three) times daily with meals. 15 mL 0   Insulin Pen Needle 32G X 4 MM MISC Use as directed 100 each 0   LANTUS SOLOSTAR 100 UNIT/ML Solostar Pen Inject 14 Units into the skin 2 (two) times daily. 15 mL 0   losartan (COZAAR) 50 MG tablet Take 1 tablet (50 mg total) by mouth daily.     magnesium gluconate (MAGONATE) 500 MG tablet Take 0.5 tablets (250 mg total) by mouth at  bedtime.     omeprazole (PRILOSEC) 20 MG capsule Take 20 mg by mouth daily.     OZEMPIC, 0.25 OR 0.5 MG/DOSE, 2 MG/3ML SOPN Inject 0.5 mg into the skin once a week. 3 mL 0   rosuvastatin (CRESTOR) 20 MG tablet Take 1 tablet (20 mg total) by mouth daily. 30 tablet 0   topiramate (TOPAMAX) 25 MG tablet Take 1 tablet (25 mg total) by mouth at bedtime. 90 tablet 3   Vitamin D, Ergocalciferol, (DRISDOL) 1.25 MG (50000  UNIT) CAPS capsule TAKE ONE CAPSULE PUFF EVERY SEVEN DAYS 7 capsule 0   No current facility-administered medications on file prior to visit.    Allergies:   Allergies  Allergen Reactions   Penicillins Anaphylaxis   Shellfish Allergy Anaphylaxis, Hives and Swelling   Amoxicillin Rash      OBJECTIVE:  Physical Exam  Vitals:   07/04/23 0856  BP: (!) 140/98  Weight: 251 lb (113.9 kg)  Height: 5\' 7"  (1.702 m)   Body mass index is 39.31 kg/m. No results found.  Poststroke PHQ 2/9    07/02/2023    8:49 AM  Depression screen PHQ 2/9  Decreased Interest 0  Down, Depressed, Hopeless 0  PHQ - 2 Score 0     General: well developed, well nourished, very pleasant middle-aged African-American female, seated, in no evident distress Head: head normocephalic and atraumatic.   Neck: supple with no carotid or supraclavicular bruits Cardiovascular: regular rate and rhythm, no murmurs Musculoskeletal: no deformity Skin:  no rash/petichiae Vascular:  Normal pulses all extremities   Neurologic Exam Mental Status: Awake and fully alert.  Mild dysarthria.  Oriented to place and time. Recent and remote memory intact. Attention span, concentration and fund of knowledge appropriate. Mood and affect appropriate.  Cranial Nerves: Fundoscopic exam reveals sharp disc margins. Pupils equal, briskly reactive to light. Extraocular movements full without nystagmus. Visual fields full to confrontation. Hearing intact. Facial sensation intact.  Slight right lower facial weakness.  Tongue,  palate moves normally and symmetrically.  Motor: Normal strength, bulk and tone left upper and lower extremity.  Very slight RUE weakness although giveaway weakness noted due to shoulder pain.  RLE 3+-4/5 HF although giveaway weakness noted, 4/5 distal Sensory.:  Slight decreased light touch sensation to right upper and lower extremity Coordination: Rapid alternating movements normal in all extremities. Finger-to-nose performed accurately bilaterally and heel-to-shin performed accurately LLE Gait and Station: Arises from chair without difficulty. Stance is normal. Gait demonstrates waddling type gait with mild favoring of RLE and use of cane.  Tandem walk and heel toe not attempted. Reflexes: 1+ and symmetric. Toes downgoing.        ASSESSMENT: Latoya Roth is a 52 y.o. year old female with left pontine infarct on 04/11/2023 secondary to small vessel disease. Vascular risk factors include HTN, HLD, DM, HIV, left Bell's palsy, former smoker and obesity.      PLAN:  Left pontine stroke:  Residual deficit: RUE<RLE weakness - continue working with PT for hopeful ongoing recovery and continued use of AD at all times unless otherwise instructed.  Continue to follow with PMR as scheduled Continue aspirin 81mg  daily and rosuvastatin (Crestor) for secondary stroke prevention managed by PCP Discussed secondary stroke prevention measures and importance of close PCP follow up for aggressive stroke risk factor management including BP goal<130/90, HLD with LDL goal<70 and DM with A1c.<7 .  Stroke labs 03/2023: LDL 92, A1c 8.0 I have gone over the pathophysiology of stroke, warning signs and symptoms, risk factors and their management in some detail with instructions to go to the closest emergency room for symptoms of concern.     Stable from stroke standpoint without further recommendations and risk factors are managed by PCP. She may follow up PRN, as usual for our patients who are strictly  being followed for stroke. If any new neurological issues should arise, request PCP place referral for evaluation by one of our neurologists. Thank you.     CC:  [facility] provider: Dr.  Pearlean Brownie PCP: Jackie Plum, MD    I spent 46 minutes of face-to-face and non-face-to-face time with patient.  This included previsit chart review including review of recent hospitalization, lab review, study review, order entry, electronic health record documentation, patient education regarding recent stroke including etiology, secondary stroke prevention measures and importance of managing stroke risk factors, residual deficits and typical recovery time and answered all other questions to patient satisfaction   Ihor Austin, AGNP-BC  [facility] [address] [place], [place] [address]  Phone [phone] Fax [phone] Note: This document was prepared with digital dictation and possible smart phrase technology. Any transcriptional errors that result from this process are unintentional.

## 2023-07-04 NOTE — Patient Instructions (Addendum)
Continue working with PT for hopeful ongoing recovery   Continue aspirin 81 mg daily  and Crestor  for secondary stroke prevention  Continue to follow up with PCP regarding cholesterol, diabetes and blood pressure management  Maintain strict control of hypertension with blood pressure goal below 130/90, diabetes with hemoglobin A1c goal below 7.0 % and cholesterol with LDL cholesterol (bad cholesterol) goal below 70 mg/dL.   Signs of a Stroke? Follow the BEFAST method:  Balance Watch for a sudden loss of balance, trouble with coordination or vertigo Eyes Is there a sudden loss of vision in one or both eyes? Or double vision?  Face: Ask the person to smile. Does one side of the face droop or is it numb?  Arms: Ask the person to raise both arms. Does one arm drift downward? Is there weakness or numbness of a leg? Speech: Ask the person to repeat a simple phrase. Does the speech sound slurred/strange? Is the person confused ? Time: If you observe any of these signs, call 911.        Thank you for coming to see [place] at [facility]. I hope we have been able to provide you high quality care today.  You may receive a patient satisfaction survey over the next few weeks. We would appreciate your feedback and comments so that we may continue to improve ourselves and the health of our patients.    Stroke Prevention Some medical conditions and lifestyle choices can lead to a higher risk for a stroke. You can help to prevent a stroke by eating healthy foods and exercising. It also helps to not smoke and to manage any health problems you may have. How can this condition affect me? A stroke is an emergency. It should be treated right away. A stroke can lead to brain damage or threaten your life. There is a better chance of surviving and getting better after a stroke if you get medical help right away. What can increase my risk? The following medical conditions may increase your risk  of a stroke: Diseases of the heart and blood vessels (cardiovascular disease). High blood pressure (hypertension). Diabetes. High cholesterol. Sickle cell disease. Problems with blood clotting. Being very overweight. Sleeping problems (obstructivesleep apnea). Other risk factors include: Being older than age 51. A history of blood clots, stroke, or mini-stroke (TIA). Race, ethnic background, or a family history of stroke. Smoking or using tobacco products. Taking birth control pills, especially if you smoke. Heavy alcohol and drug use. Not being active. What actions can I take to prevent this? Manage your health conditions High cholesterol. Eat a healthy diet. If this is not enough to manage your cholesterol, you may need to take medicines. Take medicines as told by your doctor. High blood pressure. Try to keep your blood pressure below 130/80. If your blood pressure cannot be managed through a healthy diet and regular exercise, you may need to take medicines. Take medicines as told by your doctor. Ask your doctor if you should check your blood pressure at home. Have your blood pressure checked every year. Diabetes. Eat a healthy diet and get regular exercise. If your blood sugar (glucose) cannot be managed through diet and exercise, you may need to take medicines. Take medicines as told by your doctor. Talk to your doctor about getting checked for sleeping problems. Signs of a problem can include: Snoring a lot. Feeling very tired. Make sure that you manage any other conditions you have. Nutrition  Follow instructions  from your doctor about what to eat or drink. You may be told to: Eat and drink fewer calories each day. Limit how much salt (sodium) you use to 1,500 milligrams (mg) each day. Use only healthy fats for cooking, such as olive oil, canola oil, and sunflower oil. Eat healthy foods. To do this: Choose foods that are high in fiber. These include whole grains, and  fresh fruits and vegetables. Eat at least 5 servings of fruits and vegetables a day. Try to fill one-half of your plate with fruits and vegetables at each meal. Choose low-fat (lean) proteins. These include low-fat cuts of meat, chicken without skin, fish, tofu, beans, and nuts. Eat low-fat dairy products. Avoid foods that: Are high in salt. Have saturated fat. Have trans fat. Have cholesterol. Are processed or pre-made. Count how many carbohydrates you eat and drink each day. Lifestyle If you drink alcohol: Limit how much you have to: 0-1 drink a day for women who are not pregnant. 0-2 drinks a day for men. Know how much alcohol is in your drink. In the [place] one drink equals one 12 oz bottle of beer ( ), one 5 oz glass of wine ( ), or one 1 oz glass of hard liquor (44mL). Do not smoke or use any products that have nicotine or tobacco. If you need help quitting, ask your doctor. Avoid secondhand smoke. Do not use drugs. Activity  Try to stay at a healthy weight. Get at least 30 minutes of exercise on most days, such as: Fast walking. Biking. Swimming. Medicines Take over-the-counter and prescription medicines only as told by your doctor. Avoid taking birth control pills. Talk to your doctor about the risks of taking birth control pills if: You are over 29 years old. You smoke. You get very bad headaches. You have had a blood clot. Where to find more information American Stroke Association: www.strokeassociation.org Get help right away if: You or a loved one has any signs of a stroke. "BE FAST" is an easy way to remember the warning signs: B - Balance. Dizziness, sudden trouble walking, or loss of balance. E - Eyes. Trouble seeing or a change in how you see. F - Face. Sudden weakness or loss of feeling of the face. The face or eyelid may droop on one side. A - Arms. Weakness or loss of feeling in an arm. This happens all of a sudden and most often on one side of the  body. S - Speech. Sudden trouble speaking, slurred speech, or trouble understanding what people say. T - Time. Time to call emergency services. Write down what time symptoms started. You or a loved one has other signs of a stroke, such as: A sudden, very bad headache with no known cause. Feeling like you may vomit (nausea). Vomiting. A seizure. These symptoms may be an emergency. Get help right away. Call your local emergency services (911 in the [place] Do not wait to see if the symptoms will go away. Do not drive yourself to the hospital. Summary You can help to prevent a stroke by eating healthy, exercising, and not smoking. It also helps to manage any health problems you have. Do not smoke or use any products that contain nicotine or tobacco. Get help right away if you or a loved one has any signs of a stroke. This information is not intended to replace advice given to you by your health care provider. Make sure you discuss any questions you have with your health care provider. Document Revised:  11/12/2022 Document Reviewed: 11/12/2022 Elsevier Patient Education  2024 [employer].

## 2023-07-05 ENCOUNTER — Ambulatory Visit: Payer: Medicaid Other

## 2023-07-09 ENCOUNTER — Ambulatory Visit: Payer: Medicaid Other

## 2023-07-09 NOTE — Therapy (Signed)
OUTPATIENT PHYSICAL THERAPY NEURO TREATMENT    Patient Name: Latoya Roth MRN: [id number] DOB:06/25/1971, 52 y.o., female Today's Date: 07/11/2023   PCP: Jackie Plum REFERRING PROVIDER: Mariam Dollar  END OF SESSION:  PT End of Session - 07/11/23 0926     Visit Number 13    Date for PT Re-Evaluation 07/25/23    PT Start Time 0925    PT Stop Time 1010    PT Time Calculation (min) 45 min                   Past Medical History:  Diagnosis Date   Anxiety    Arthritis    Asthma    Bell's palsy    BV (bacterial vaginosis)    Depression    Diabetes mellitus    HIV disease (HCC)    Hypertension    Obesity    Past Surgical History:  Procedure Laterality Date   CHOLECYSTECTOMY     Patient Active Problem List   Diagnosis Date Noted   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   Depression 04/12/2023   Moderate persistent asthma 04/12/2023   Allergic rhinitis 04/12/2023   Acute ischemic stroke (HCC) 04/11/2023   Counseled about COVID-19 virus infection 01/25/2020   Screening examination for venereal disease 12/31/2017   Encounter for long-term (current) use of high-risk medication 12/31/2017   Medication monitoring encounter 05/06/2017   DM2 (diabetes mellitus, type 2) (HCC) 04/04/2017   HIV (human immunodeficiency virus infection) (HCC) 03/13/2017   Essential hypertension 04/27/2016   Asthma 04/27/2016   BMI 40.0-44.9, adult (HCC) 01/15/2014    ONSET DATE: 04/11/23  REFERRING DIAG:  Diagnosis  I63.89 (ICD-10-CM) - Other cerebral infarction    THERAPY DIAG:  Acute brainstem infarction (HCC)  Muscle weakness (generalized)  Other abnormalities of gait and mobility  Other lack of coordination  Rationale for Evaluation and Treatment: Rehabilitation  SUBJECTIVE:                                                                                                                                                                                              SUBJECTIVE STATEMENT: I am doing good.   PERTINENT HISTORY: Latoya Roth is a 52 y.o. female who presented to the ED on 04/11/2023 complaining of right-sided weakness and right-facial droop that started four days prior to presentation. Head CT obtained with strong suspicion for a pontine infarct which was followed by an MRI that confirmed a subacute left paramedian pons infarct. Neurology was consulted for stroke workup. Does not use tobacco. Drinks about 2 alcoholic servings per day. Medical history significant for hypertension, HIV  on HARRT therapy, DM-2. Vitals were relatively stable. Labs showed sodium slightly low at 135. Urine drug screen was negative. Urinalysis was negative as well. EKG showed normal sinus rhythm. Hgb A1c 8.0%. LDL 92. 2D echo 55-60%. Plavix and aspirin started. Continue for 3 weeks then aspirin alone. Diabetic coordinator consulted. Lovenox for DVT prophy. She is requiring mod A to prevent loss of balance. Tolerating carb modified diet.   Latoya Roth was admitted to rehab 04/16/2023 for inpatient therapies to consist of PT, ST and OT at least three hours five days a week. Past admission physiatrist, therapy team and rehab RN have worked together to provide customized collaborative inpatient rehab. She has had improvement in functional use RUE/LUE  and RLE/LLE as well as improvement in awareness. 4/30 . Patient completed stand pivot transfer with supervision and no AD to wheelchair. Propelled herself in w/c ~146ft with supervision using BUE - difficulty maintaining straight path due to RUE weakness. Mod I in room. PT arranged through [facility].   DM, HIV, HTN   PAIN:  Are you having pain? No  PRECAUTIONS: None  WEIGHT BEARING RESTRICTIONS: No  FALLS: Has patient fallen in last 6 months? Yes. 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 No acute hemorrhage or mass effect.  IMPRESSION: No large vessel occlusion or proximal hemodynamically significant stenosis.  COGNITION: Overall cognitive status: Within functional limits for tasks assessed   SENSATION: WFL  POSTURE: rounded shoulders, forward head, increased thoracic kyphosis, and flexed trunk   Increased knee valgus  LOWER EXTREMITY ROM:  grossly WFL    LOWER EXTREMITY MMT:    MMT Right Eval Left Eval  Hip flexion 3+ 5  Hip extension    Hip abduction    Hip adduction    Hip internal rotation    Hip external rotation    Knee flexion 4 5  Knee extension 3+ 4  Ankle dorsiflexion    Ankle plantarflexion    Ankle inversion    Ankle eversion    (Blank rows = not tested)   TRANSFERS: Assistive device utilized: Environmental consultant - 4 wheeled  Sit to stand: Min A Stand to sit: Modified independence   GAIT: Gait pattern: step through pattern, decreased stance time- Right, decreased stride length, genu recurvatum- Right, trendelenburg, and narrow BOS Distance walked: in clinic distances Assistive device utilized: Environmental consultant - 4 wheeled Level of assistance: Modified independence Comments: increased genu valgum on bilateral knees   FUNCTIONAL TESTS:  5 times sit to stand: 33.21s Timed up and go (TUG): 21s Berg Balance Scale: 39/56   TODAY'S TREATMENT:  DATE:  07/11/23 Walking outdoors working on curbs and halfway [place] NuStep L5 x40mins  3# marching Hip abd/ext 3# 2x10 Calf raises 3# 2x10  Standing by treadmill heels taps on side 3#- able to do some  reps without holding on  STS 2x5  4 way ankle green 20 reps both sides   07/03/23 NuStep L5 x49mins  Step ups 6"  On airex 6" heel taps  Calf raises on airex 2x12 in II bars  Leg ext 10# 2x10 HS curls 25# 2x10  Shoulder ext 10# 2x10  Leg press 20# 2x10 Ankle TB green eversion and DF 20 reps    06/19/23 NuStep L5 x58mins  Leg ext 10# 2x10, RLE x10  HS curls 25# 2x10, RLE x10  Calf raises on step 2x12  Step ups on airex in II bars Side steps on airex in II bars  On airex feet together, then eyes closed  STS 2x10  06/12/23 Progress note, reviewed tests and goals  Outside curb navigation  NuStep L5 x29mins  Step up on airex 2 steps but discontinued  5# AW marching  Hip abd/ext 5# AW Step overs at stairs 4"   06/07/23 Walking outdoors with quad cane on grass and around [address], up and down curbs  NuStep L5 x39mins  Leg ext 10# 2x10 HS curls 25# 2x10 Leg press 20# 2x10 Calf raises 2x12 STS with chest press 2x5    06/05/23 NuStep L5 x10mins  Walking with quad cane 2 big laps in gym Calf raises 2x12 Side steps over obstacles  STS on airex with OHP 3x5 Standing on airex heel taps 6" Tandem standing on beam  Stepping and reaching targets behind her  Ankle TB green 20 reps each way R  05/29/23 NuStep L5 x82mins  Walking on beam On airex cone taps in II bars  Volleyball hits standing in bars  Resisted gait 20# total, x4 forwards, x2 sideways  Leg ext 10# 2x8 HS curls 20# 2x10   05/24/23 Walking outdoors half way around [place] and up and down curbs with SPC STS on airex 2x5 Step ups on airex x10 each side modA  Tandem stance 30s  SLS 3s at best both sides  Ball squeezes 2x10 Bridges 2x10 SLR 2x10  Fitter pushes 1 black band Calf raises 2x12   05/22/23 Standing hip flexion 3# 2x10 Hip abd 3# 2x10  Bike L4.5 x5 mins  Standing on airex feet together, then eyes closed 30s  Standing on airex marching 20 reps alt holding on to rollator  STS from blue chair  with double airex on top x10 Walking outdoor down sidewalk and up curb to picnic table and back Toe raises 2x10 Calf raises 2x10    05/16/23 NuStep L5 x48mins Calf stretch 30s  Calf raises 2x10  Step ups 6" x5 each side, fatigue and difficulty, tried on 4" instead able to do x10 with fatigue Walking on beam in II bars Tandem standing 10s  Walking laps without AD 2 big laps x2 with rest break in between  ankleTB green 20 reps all ways both ankles   05/15/23 Walking 3 laps w/o AD  STS on airex from elevated mat table x10, pain in knees - CGA Leg ext 10# 2x10, RLE x5 HS curls 20# 2x10, RLE x10 3# marching 20 reps minA  3# hip abd 2x10  Leg press 20# 2x10  NuStep L5 x63mins    05/08/23 LAQ 2# 2x10 Marching 2# 20 reps alt x2 HS curls green  2x10  STS with red ball chest press 2x10  NuStep L4 x64mins  Walking without AD 1 lap in gym  Step ups 4" at stairs  Standing on airex feet together 25s at best, then eyes closed 10s in // bars  Step up on airex in // bars holding on x5 each side    05/02/23- EVAL   PATIENT EDUCATION: Education details: POC and HEP Person educated: Patient Education method: Explanation Education comprehension: verbalized understanding  HOME EXERCISE PROGRAM: Access Code: [email or web address] URL: [email or web address] Date: 05/02/2023 Prepared by: Cassie Freer  Exercises - Standing Hip Abduction with Counter Support  - 1 x daily - 7 x weekly - 2 sets - 10 reps - Mini Squat with Counter Support  - 1 x daily - 7 x weekly - 2 sets - 10 reps - Standing March with Counter Support  - 1 x daily - 7 x weekly - 2 sets - 10 reps - Standing Tandem Balance with Counter Support  - 2 x daily - 7 x weekly - 10-30 hold - Standing Single Leg Stance with Counter Support  - 2 x daily - 7 x weekly - 10 hold - Seated hip flexion against band - 2 x daily- 7x weekly - 10 reps  - Seated hip abduction against band - 2 x daily- 7x weekly - 10 reps  - Seated knee extension  against band - 2 x daily- 7x weekly - 10 reps   GOALS: Goals reviewed with patient? Yes  SHORT TERM GOALS: Target date: 06/13/23  Patient will be independent with initial HEP. Goal status: MET  2.  Patient will demonstrate decreased fall risk by scoring < 15 sec on TUG w/o AD Baseline: 21 with rollator  Goal status: MET 11.65 06/12/23   LONG TERM GOALS: Target date: 07/25/23  Patient will be independent with advanced/ongoing HEP to improve outcomes and carryover.  Goal status: INITIAL  2.  Patient will be able to ambulate 500' with LRAD with good safety to access community.  Baseline: using rollator Goal status: IN PROGRESS 06/12/23  3.  Patient will be able to step up/down curb safely with LRAD for safety with community ambulation.  Goal status: IN PROGRESS modA at curbs 06/12/23   4.  Patient will demonstrate improved functional LE strength as demonstrated by 5xSTS <15s. Baseline: 33.21s, 17.12s 06/12/23 Goal status: IN PROGRESS  5.  Patient will score 47 on Berg Balance test to demonstrate lower risk of falls. (MCID= 8 points) .  Baseline: 39, 44- 06/12/23 Goal status: IN PROGRESS   ASSESSMENT:  CLINICAL IMPRESSION: Patient reports no new issues. Still fearful walking outdoors and navigating curbs. We practiced today and she needs minA going up and down. More difficulty going up the curb. She tends to drag her feet and needs cues to pick them up to avoid tripping. Continue to progress with strength and balance to decrease her risk for falls. Some good progress able to do alternating taps without needing to hold on and does STS from lower mat today.    OBJECTIVE IMPAIRMENTS: Abnormal gait, decreased balance, difficulty walking, and decreased strength.   REHAB POTENTIAL: Good  CLINICAL DECISION MAKING: Stable/uncomplicated  EVALUATION COMPLEXITY: Low  PLAN:  PT FREQUENCY: 2x/week  PT DURATION: 12 weeks  PLANNED INTERVENTIONS: Therapeutic exercises, Therapeutic  activity, Neuromuscular re-education, Balance training, Gait training, Patient/Family education, Self Care, Joint mobilization, Stair training, Dry Needling, Cryotherapy, Moist heat, Ionotophoresis 4mg /ml Dexamethasone, and Manual therapy  PLAN FOR NEXT SESSION: continue to work  on LE strengthening and balance   Cassie Freer, PT 07/11/2023, 10:11 AM

## 2023-07-11 ENCOUNTER — Ambulatory Visit: Payer: Medicaid Other

## 2023-07-11 DIAGNOSIS — R2689 Other abnormalities of gait and mobility: Secondary | ICD-10-CM

## 2023-07-11 DIAGNOSIS — I6389 Other cerebral infarction: Secondary | ICD-10-CM

## 2023-07-11 DIAGNOSIS — M6281 Muscle weakness (generalized): Secondary | ICD-10-CM

## 2023-07-11 DIAGNOSIS — R278 Other lack of coordination: Secondary | ICD-10-CM

## 2023-07-11 NOTE — Therapy (Signed)
OUTPATIENT PHYSICAL THERAPY NEURO TREATMENT    Patient Name: Latoya Roth MRN: [id number] DOB:[birth year], 52 y.o., female Today's Date: 07/15/2023   PCP: Jackie Plum REFERRING PROVIDER: Mariam Dollar  END OF SESSION:  PT End of Session - 07/15/23 0849     Visit Number 14    Date for PT Re-Evaluation 07/25/23    PT Start Time 0845    PT Stop Time 0930    PT Time Calculation (min) 45 min                    Past Medical History:  Diagnosis Date   Anxiety    Arthritis    Asthma    Bell's palsy    BV (bacterial vaginosis)    Depression    Diabetes mellitus    HIV disease (HCC)    Hypertension    Obesity    Past Surgical History:  Procedure Laterality Date   CHOLECYSTECTOMY     Patient Active Problem List   Diagnosis Date Noted   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   Depression 04/12/2023   Moderate persistent asthma 04/12/2023   Allergic rhinitis 04/12/2023   Acute ischemic stroke (HCC) 04/11/2023   Counseled about COVID-19 virus infection 01/25/2020   Screening examination for venereal disease 12/31/2017   Encounter for long-term (current) use of high-risk medication 12/31/2017   Medication monitoring encounter 05/06/2017   DM2 (diabetes mellitus, type 2) (HCC) 04/04/2017   HIV (human immunodeficiency virus infection) (HCC) 03/13/2017   Essential hypertension 04/27/2016   Asthma 04/27/2016   BMI 40.0-44.9, adult (HCC) 01/15/2014    ONSET DATE: 04/11/23  REFERRING DIAG:  Diagnosis  I63.89 (ICD-10-CM) - Other cerebral infarction    THERAPY DIAG:  Acute brainstem infarction (HCC)  Muscle weakness (generalized)  Other abnormalities of gait and mobility  Other lack of coordination  Rationale for Evaluation and Treatment: Rehabilitation  SUBJECTIVE:                                                                                                                                                                                              SUBJECTIVE STATEMENT: I am doing alright, my shoulder was hurting yesterday.   PERTINENT HISTORY: Latoya Roth is a 52 y.o. female who presented to the ED on 04/11/2023 complaining of right-sided weakness and right-facial droop that started four days prior to presentation. Head CT obtained with strong suspicion for a pontine infarct which was followed by an MRI that confirmed a subacute left paramedian pons infarct. Neurology was consulted for stroke workup. Does not use tobacco. Drinks about 2 alcoholic servings per day.  Medical history significant for hypertension, HIV on HARRT therapy, DM-2. Vitals were relatively stable. Labs showed sodium slightly low at 135. Urine drug screen was negative. Urinalysis was negative as well. EKG showed normal sinus rhythm. Hgb A1c 8.0%. LDL 92. 2D echo 55-60%. Plavix and aspirin started. Continue for 3 weeks then aspirin alone. Diabetic coordinator consulted. Lovenox for DVT prophy. She is requiring mod A to prevent loss of balance. Tolerating carb modified diet.   [name] was admitted to rehab 04/16/2023 for inpatient therapies to consist of PT, ST and OT at least three hours five days a week. Past admission physiatrist, therapy team and rehab RN have worked together to provide customized collaborative inpatient rehab. She has had improvement in functional use RUE/LUE  and RLE/LLE as well as improvement in awareness. 4/30 . Patient completed stand pivot transfer with supervision and no AD to wheelchair. Propelled herself in w/c ~171ft with supervision using BUE - difficulty maintaining straight path due to RUE weakness. Mod I in room. PT arranged through [facility].   DM, HIV, HTN   PAIN:  Are you having pain? No  PRECAUTIONS: None  WEIGHT BEARING RESTRICTIONS: No  FALLS: Has patient fallen in last 6 months? Yes. 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 No acute hemorrhage or mass effect.  IMPRESSION: No large vessel occlusion or proximal hemodynamically significant stenosis.  COGNITION: Overall cognitive status: Within functional limits for tasks assessed   SENSATION: WFL  POSTURE: rounded shoulders, forward head, increased thoracic kyphosis, and flexed trunk   Increased knee valgus  LOWER EXTREMITY ROM:  grossly WFL    LOWER EXTREMITY MMT:    MMT Right Eval Left Eval  Hip flexion 3+ 5  Hip extension    Hip abduction    Hip adduction    Hip internal rotation    Hip external rotation    Knee flexion 4 5  Knee extension 3+ 4  Ankle dorsiflexion    Ankle plantarflexion    Ankle inversion    Ankle eversion    (Blank rows = not tested)   TRANSFERS: Assistive device utilized: Environmental consultant - 4 wheeled  Sit to stand: Min A Stand to sit: Modified independence   GAIT: Gait pattern: step through pattern, decreased stance time- Right, decreased stride length, genu recurvatum- Right, trendelenburg, and narrow BOS Distance walked: in clinic distances Assistive device utilized: Environmental consultant - 4 wheeled Level of assistance: Modified independence Comments: increased genu valgum on bilateral knees   FUNCTIONAL TESTS:  5 times sit to stand: 33.21s Timed up and go (TUG): 21s Berg Balance Scale: 39/56   TODAY'S TREATMENT:  DATE:  07/15/23 NuStep L5 x3mins  Walking on beam Standing on airex reaching for target at wall in bars Leg ext 10# 2x10 HS curls 25# 2x10 Calf raises 2x12 Toe raises  2x12  STS on airex 2x10 from elevated mat table   07/11/23 Walking outdoors working on curbs and halfway around [employer] NuStep L5 x26mins  3# marching Hip abd/ext 3# 2x10 Calf raises 3# 2x10  Standing by treadmill heels taps on side 3#- able to do some reps without holding on  STS 2x5  4 way ankle green 20 reps both sides   07/03/23 NuStep L5 x65mins  Step ups 6"  On airex 6" heel taps  Calf raises on airex 2x12 in II bars  Leg ext 10# 2x10 HS curls 25# 2x10  Shoulder ext 10# 2x10  Leg press 20# 2x10 Ankle TB green eversion and DF 20 reps    06/19/23 NuStep L5 x1mins  Leg ext 10# 2x10, RLE x10  HS curls 25# 2x10, RLE x10  Calf raises on step 2x12  Step ups on airex in II bars Side steps on airex in II bars  On airex feet together, then eyes closed  STS 2x10  06/12/23 Progress note, reviewed tests and goals  Outside curb navigation  NuStep L5 x32mins  Step up on airex 2 steps but discontinued  5# AW marching  Hip abd/ext 5# AW Step overs at stairs 4"   06/07/23 Walking outdoors with quad cane on grass and around [address], up and down curbs  NuStep L5 x59mins  Leg ext 10# 2x10 HS curls 25# 2x10 Leg press 20# 2x10 Calf raises 2x12 STS with chest press 2x5    06/05/23 NuStep L5 x43mins  Walking with quad cane 2 big laps in gym Calf raises 2x12 Side steps over obstacles  STS on airex with OHP 3x5 Standing on airex heel taps 6" Tandem standing on beam  Stepping and reaching targets behind her  Ankle TB green 20 reps each way R  05/29/23 NuStep L5 x72mins  Walking on beam On airex cone taps in II bars  Volleyball hits standing in bars  Resisted gait 20# total, x4 forwards, x2 sideways  Leg ext 10# 2x8 HS curls 20# 2x10   05/24/23 Walking outdoors half way around [place] and up and down curbs with SPC STS on airex 2x5 Step ups on airex x10 each side modA  Tandem stance 30s  SLS 3s at best both sides  Ball squeezes 2x10 Bridges 2x10 SLR 2x10  Fitter  pushes 1 black band Calf raises 2x12   05/22/23 Standing hip flexion 3# 2x10 Hip abd 3# 2x10  Bike L4.5 x5 mins  Standing on airex feet together, then eyes closed 30s  Standing on airex marching 20 reps alt holding on to rollator  STS from blue chair with double airex on top x10 Walking outdoor down sidewalk and up curb to picnic table and back Toe raises 2x10 Calf raises 2x10    05/16/23 NuStep L5 x61mins Calf stretch 30s  Calf raises 2x10  Step ups 6" x5 each side, fatigue and difficulty, tried on 4" instead able to do x10 with fatigue Walking on beam in II bars Tandem standing 10s  Walking laps without AD 2 big laps x2 with rest break in between  ankleTB green 20 reps all ways both ankles   05/15/23 Walking 3 laps w/o AD  STS on airex from elevated mat table x10, pain in knees - CGA Leg ext 10# 2x10,  RLE x5 HS curls 20# 2x10, RLE x10 3# marching 20 reps minA  3# hip abd 2x10  Leg press 20# 2x10  NuStep L5 x22mins    05/08/23 LAQ 2# 2x10 Marching 2# 20 reps alt x2 HS curls green 2x10  STS with red ball chest press 2x10  NuStep L4 x52mins  Walking without AD 1 lap in gym  Step ups 4" at stairs  Standing on airex feet together 25s at best, then eyes closed 10s in // bars  Step up on airex in // bars holding on x5 each side    05/02/23- EVAL   PATIENT EDUCATION: Education details: POC and HEP Person educated: Patient Education method: Explanation Education comprehension: verbalized understanding  HOME EXERCISE PROGRAM: Access Code: [email or web address] URL: [email or web address] Date: 05/02/2023 Prepared by: Cassie Freer  Exercises - Standing Hip Abduction with Counter Support  - 1 x daily - 7 x weekly - 2 sets - 10 reps - Mini Squat with Counter Support  - 1 x daily - 7 x weekly - 2 sets - 10 reps - Standing March with Counter Support  - 1 x daily - 7 x weekly - 2 sets - 10 reps - Standing Tandem Balance with Counter Support  - 2 x daily - 7 x weekly -  10-30 hold - Standing Single Leg Stance with Counter Support  - 2 x daily - 7 x weekly - 10 hold - Seated hip flexion against band - 2 x daily- 7x weekly - 10 reps  - Seated hip abduction against band - 2 x daily- 7x weekly - 10 reps  - Seated knee extension against band - 2 x daily- 7x weekly - 10 reps   GOALS: Goals reviewed with patient? Yes  SHORT TERM GOALS: Target date: 06/13/23  Patient will be independent with initial HEP. Goal status: MET  2.  Patient will demonstrate decreased fall risk by scoring < 15 sec on TUG w/o AD Baseline: 21 with rollator  Goal status: MET 11.65 06/12/23   LONG TERM GOALS: Target date: 07/25/23  Patient will be independent with advanced/ongoing HEP to improve outcomes and carryover.  Goal status: INITIAL  2.  Patient will be able to ambulate 500' with LRAD with good safety to access community.  Baseline: using rollator Goal status: IN PROGRESS 06/12/23  3.  Patient will be able to step up/down curb safely with LRAD for safety with community ambulation.  Goal status: IN PROGRESS modA at curbs 06/12/23   4.  Patient will demonstrate improved functional LE strength as demonstrated by 5xSTS <15s. Baseline: 33.21s, 17.12s 06/12/23 Goal status: IN PROGRESS  5.  Patient will score 47 on Berg Balance test to demonstrate lower risk of falls. (MCID= 8 points) .  Baseline: 39, 44- 06/12/23 Goal status: IN PROGRESS   ASSESSMENT:  CLINICAL IMPRESSION: Patient reports no new issues. Continue to progress with strength and balance to decrease her risk for falls. Reports tandem walking is still the hardest thing for her. She demonstrates good weight shifting on airex when reaching outside her BOS, but still has some LOB. Ankle instability noted with calf and toe raises on bar-- they are more inverted and she has higher risk for ankle inversions.    OBJECTIVE IMPAIRMENTS: Abnormal gait, decreased balance, difficulty walking, and decreased strength.   REHAB  POTENTIAL: Good  CLINICAL DECISION MAKING: Stable/uncomplicated  EVALUATION COMPLEXITY: Low  PLAN:  PT FREQUENCY: 2x/week  PT DURATION: 12 weeks  PLANNED INTERVENTIONS: Therapeutic exercises, Therapeutic activity, Neuromuscular  re-education, Balance training, Gait training, Patient/Family education, Self Care, Joint mobilization, Stair training, Dry Needling, Cryotherapy, Moist heat, Ionotophoresis 4mg /ml Dexamethasone, and Manual therapy  PLAN FOR NEXT SESSION: continue to work on LE strengthening and [employer], PT 07/15/2023, 9:27 AM

## 2023-07-15 ENCOUNTER — Ambulatory Visit: Payer: Medicaid Other

## 2023-07-15 DIAGNOSIS — I6389 Other cerebral infarction: Secondary | ICD-10-CM | POA: Diagnosis not present

## 2023-07-15 DIAGNOSIS — M6281 Muscle weakness (generalized): Secondary | ICD-10-CM

## 2023-07-15 DIAGNOSIS — R2689 Other abnormalities of gait and mobility: Secondary | ICD-10-CM

## 2023-07-15 DIAGNOSIS — R278 Other lack of coordination: Secondary | ICD-10-CM

## 2023-07-16 NOTE — Therapy (Signed)
OUTPATIENT PHYSICAL THERAPY NEURO TREATMENT    Patient Name: Latoya Roth MRN: [id number] DOB:26-May-1971, 52 y.o., female Today's Date: 07/17/2023   PCP: Jackie Plum REFERRING PROVIDER: Mariam Dollar  END OF SESSION:  PT End of Session - 07/17/23 0759     Visit Number 15    Date for PT Re-Evaluation 07/25/23    PT Start Time 0800    PT Stop Time 0845    PT Time Calculation (min) 45 min                     Past Medical History:  Diagnosis Date   Anxiety    Arthritis    Asthma    Bell's palsy    BV (bacterial vaginosis)    Depression    Diabetes mellitus    HIV disease (HCC)    Hypertension    Obesity    Past Surgical History:  Procedure Laterality Date   CHOLECYSTECTOMY     Patient Active Problem List   Diagnosis Date Noted   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   Depression 04/12/2023   Moderate persistent asthma 04/12/2023   Allergic rhinitis 04/12/2023   Acute ischemic stroke (HCC) 04/11/2023   Counseled about COVID-19 virus infection 01/25/2020   Screening examination for venereal disease 12/31/2017   Encounter for long-term (current) use of high-risk medication 12/31/2017   Medication monitoring encounter 05/06/2017   DM2 (diabetes mellitus, type 2) (HCC) 04/04/2017   HIV (human immunodeficiency virus infection) (HCC) 03/13/2017   Essential hypertension 04/27/2016   Asthma 04/27/2016   BMI 40.0-44.9, adult (HCC) 01/15/2014    ONSET DATE: 04/11/23  REFERRING DIAG:  Diagnosis  I63.89 (ICD-10-CM) - Other cerebral infarction    THERAPY DIAG:  Acute brainstem infarction (HCC)  Muscle weakness (generalized)  Other abnormalities of gait and mobility  Other lack of coordination  Rationale for Evaluation and Treatment: Rehabilitation  SUBJECTIVE:                                                                                                                                                                                              SUBJECTIVE STATEMENT: I am doing alright, my shoulder was hurting yesterday.   PERTINENT HISTORY: Latoya Roth is a 52 y.o. female who presented to the ED on 04/11/2023 complaining of right-sided weakness and right-facial droop that started four days prior to presentation. Head CT obtained with strong suspicion for a pontine infarct which was followed by an MRI that confirmed a subacute left paramedian pons infarct. Neurology was consulted for stroke workup. Does not use tobacco. Drinks about 2 alcoholic servings per  day. Medical history significant for hypertension, HIV on HARRT therapy, DM-2. Vitals were relatively stable. Labs showed sodium slightly low at 135. Urine drug screen was negative. Urinalysis was negative as well. EKG showed normal sinus rhythm. Hgb A1c 8.0%. LDL 92. 2D echo 55-60%. Plavix and aspirin started. Continue for 3 weeks then aspirin alone. Diabetic coordinator consulted. Lovenox for DVT prophy. She is requiring mod A to prevent loss of balance. Tolerating carb modified diet.   Latoya Roth was admitted to rehab 04/16/2023 for inpatient therapies to consist of PT, ST and OT at least three hours five days a week. Past admission physiatrist, therapy team and rehab RN have worked together to provide customized collaborative inpatient rehab. She has had improvement in functional use RUE/LUE  and RLE/LLE as well as improvement in awareness. 4/30 . Patient completed stand pivot transfer with supervision and no AD to wheelchair. Propelled herself in w/c ~140ft with supervision using BUE - difficulty maintaining straight path due to RUE weakness. Mod I in room. PT arranged through [facility].   DM, HIV, HTN   PAIN:  Are you having pain? No  PRECAUTIONS: None  WEIGHT BEARING RESTRICTIONS: No  FALLS: Has patient fallen in last 6 months? Yes. 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 No acute hemorrhage or mass effect.  IMPRESSION: No large vessel occlusion or proximal hemodynamically significant stenosis.  COGNITION: Overall cognitive status: Within functional limits for tasks assessed   SENSATION: WFL  POSTURE: rounded shoulders, forward head, increased thoracic kyphosis, and flexed trunk   Increased knee valgus  LOWER EXTREMITY ROM:  grossly WFL    LOWER EXTREMITY MMT:    MMT Right Eval Left Eval  Hip flexion 3+ 5  Hip extension    Hip abduction    Hip adduction    Hip internal rotation    Hip external rotation    Knee flexion 4 5  Knee extension 3+ 4  Ankle dorsiflexion    Ankle plantarflexion    Ankle inversion    Ankle eversion    (Blank rows = not tested)   TRANSFERS: Assistive device utilized: Environmental consultant - 4 wheeled  Sit to stand: Min A Stand to sit: Modified independence   GAIT: Gait pattern: step through pattern, decreased stance time- Right, decreased stride length, genu recurvatum- Right, trendelenburg, and narrow BOS Distance walked: in clinic distances Assistive device utilized: Environmental consultant - 4 wheeled Level of assistance: Modified independence Comments: increased genu valgum on bilateral knees   FUNCTIONAL TESTS:  5 times sit to stand: 33.21s Timed up and go (TUG): 21s Berg Balance Scale: 39/56   TODAY'S TREATMENT:  DATE:  07/17/23 NuStep L5 x45mins 5# hip abd/ext 2x10 Leg press 20# 2x10 Calf raises on leg press 20# 2x12 Marching on airex Standing on airex catch, then hitting  volleyball  Lateral side steps green band in II bars  Ankle TB green 20 reps each way   07/15/23 NuStep L5 x49mins  Walking on beam Standing on airex reaching for target at wall in bars Leg ext 10# 2x10 HS curls 25# 2x10 Calf raises 2x12 Toe raises 2x12  STS on airex 2x10 from elevated mat table   07/11/23 Walking outdoors working on curbs and halfway around [employer] NuStep L5 x70mins  3# marching Hip abd/ext 3# 2x10 Calf raises 3# 2x10  Standing by treadmill heels taps on side 3#- able to do some reps without holding on  STS 2x5  4 way ankle green 20 reps both sides   07/03/23 NuStep L5 x21mins  Step ups 6"  On airex 6" heel taps  Calf raises on airex 2x12 in II bars  Leg ext 10# 2x10 HS curls 25# 2x10  Shoulder ext 10# 2x10  Leg press 20# 2x10 Ankle TB green eversion and DF 20 reps    06/19/23 NuStep L5 x51mins  Leg ext 10# 2x10, RLE x10  HS curls 25# 2x10, RLE x10  Calf raises on step 2x12  Step ups on airex in II bars Side steps on airex in II bars  On airex feet together, then eyes closed  STS 2x10  06/12/23 Progress note, reviewed tests and goals  Outside curb navigation  NuStep L5 x72mins  Step up on airex 2 steps but discontinued  5# AW marching  Hip abd/ext 5# AW Step overs at stairs 4"   06/07/23 Walking outdoors with quad cane on grass and around [address], up and down curbs  NuStep L5 x73mins  Leg ext 10# 2x10 HS curls 25# 2x10 Leg press 20# 2x10 Calf raises 2x12 STS with chest press 2x5    06/05/23 NuStep L5 x75mins  Walking with quad cane 2 big laps in gym Calf raises 2x12 Side steps over obstacles  STS on airex with OHP 3x5 Standing on airex heel taps 6" Tandem standing on beam  Stepping and reaching targets behind her  Ankle TB green 20 reps each way R  05/29/23 NuStep L5 x72mins  Walking on beam On airex cone taps in II bars  Volleyball hits standing in bars  Resisted gait 20# total, x4 forwards, x2 sideways  Leg ext 10# 2x8 HS  curls 20# 2x10   05/24/23 Walking outdoors half way around [place] and up and down curbs with SPC STS on airex 2x5 Step ups on airex x10 each side modA  Tandem stance 30s  SLS 3s at best both sides  Ball squeezes 2x10 Bridges 2x10 SLR 2x10  Fitter pushes 1 black band Calf raises 2x12   05/22/23 Standing hip flexion 3# 2x10 Hip abd 3# 2x10  Bike L4.5 x5 mins  Standing on airex feet together, then eyes closed 30s  Standing on airex marching 20 reps alt holding on to rollator  STS from blue chair with double airex on top x10 Walking outdoor down sidewalk and up curb to picnic table and back Toe raises 2x10 Calf raises 2x10    05/16/23 NuStep L5 x12mins Calf stretch 30s  Calf raises 2x10  Step ups 6" x5 each side, fatigue and difficulty, tried on 4" instead able to do x10 with fatigue Walking on beam in II bars Tandem standing 10s  Walking laps without AD 2 big laps x2 with rest break in between  ankleTB green 20 reps all ways both ankles   05/15/23 Walking 3 laps w/o AD  STS on airex from elevated mat table x10, pain in knees - CGA Leg ext 10# 2x10, RLE x5 HS curls 20# 2x10, RLE x10 3# marching 20 reps minA  3# hip abd 2x10  Leg press 20# 2x10  NuStep L5 x38mins    05/08/23 LAQ 2# 2x10 Marching 2# 20 reps alt x2 HS curls green 2x10  STS with red ball chest press 2x10  NuStep L4 x62mins  Walking without AD 1 lap in gym  Step ups 4" at stairs  Standing on airex feet together 25s at best, then eyes closed 10s in // bars  Step up on airex in // bars holding on x5 each side    05/02/23- EVAL   PATIENT EDUCATION: Education details: POC and HEP Person educated: Patient Education method: Explanation Education comprehension: verbalized understanding  HOME EXERCISE PROGRAM: Access Code: [email or web address] URL: [email or web address] Date: 05/02/2023 Prepared by: Cassie Freer  Exercises - Standing Hip Abduction with Counter Support  - 1 x daily - 7 x weekly - 2  sets - 10 reps - Mini Squat with Counter Support  - 1 x daily - 7 x weekly - 2 sets - 10 reps - Standing March with Counter Support  - 1 x daily - 7 x weekly - 2 sets - 10 reps - Standing Tandem Balance with Counter Support  - 2 x daily - 7 x weekly - 10-30 hold - Standing Single Leg Stance with Counter Support  - 2 x daily - 7 x weekly - 10 hold - Seated hip flexion against band - 2 x daily- 7x weekly - 10 reps  - Seated hip abduction against band - 2 x daily- 7x weekly - 10 reps  - Seated knee extension against band - 2 x daily- 7x weekly - 10 reps   GOALS: Goals reviewed with patient? Yes  SHORT TERM GOALS: Target date: 06/13/23  Patient will be independent with initial HEP. Goal status: MET  2.  Patient will demonstrate decreased fall risk by scoring < 15 sec on TUG w/o AD Baseline: 21 with rollator  Goal status: MET 11.65 06/12/23   LONG TERM GOALS: Target date: 07/25/23  Patient will be independent with advanced/ongoing HEP to improve outcomes and carryover.  Goal status: INITIAL  2.  Patient will be able to ambulate 500' with LRAD with good safety to access community.  Baseline: using rollator Goal status: IN PROGRESS 06/12/23  3.  Patient will be able to step up/down curb safely with LRAD for safety with community ambulation.  Goal status: IN PROGRESS modA at curbs 06/12/23   4.  Patient will demonstrate improved functional LE strength as demonstrated by 5xSTS <15s. Baseline: 33.21s, 17.12s 06/12/23 Goal status: IN PROGRESS  5.  Patient will score 47 on Berg Balance test to demonstrate lower risk of falls. (MCID= 8 points) .  Baseline: 39, 44- 06/12/23 Goal status: IN PROGRESS   ASSESSMENT:  CLINICAL IMPRESSION: Patient reports no new issues. Continue to progress with strength and balance to decrease her risk for falls. Lower extremity fatigue presented throughout session, requiring longer rest breaks in between sets. Does some walking in the clinic without cane but  still very unsteady.    OBJECTIVE IMPAIRMENTS: Abnormal gait, decreased balance, difficulty walking, and decreased strength.   REHAB POTENTIAL: Good  CLINICAL DECISION MAKING:  Stable/uncomplicated  EVALUATION COMPLEXITY: Low  PLAN:  PT FREQUENCY: 2x/week  PT DURATION: 12 weeks  PLANNED INTERVENTIONS: Therapeutic exercises, Therapeutic activity, Neuromuscular re-education, Balance training, Gait training, Patient/Family education, Self Care, Joint mobilization, Stair training, Dry Needling, Cryotherapy, Moist heat, Ionotophoresis 4mg /ml Dexamethasone, and Manual therapy  PLAN FOR NEXT SESSION: continue to work on LE strengthening and [employer], PT 07/17/2023, 8:42 AM

## 2023-07-17 ENCOUNTER — Ambulatory Visit: Payer: Medicaid Other

## 2023-07-17 DIAGNOSIS — R278 Other lack of coordination: Secondary | ICD-10-CM

## 2023-07-17 DIAGNOSIS — R2689 Other abnormalities of gait and mobility: Secondary | ICD-10-CM

## 2023-07-17 DIAGNOSIS — I6389 Other cerebral infarction: Secondary | ICD-10-CM | POA: Diagnosis not present

## 2023-07-17 DIAGNOSIS — M6281 Muscle weakness (generalized): Secondary | ICD-10-CM

## 2023-07-22 ENCOUNTER — Ambulatory Visit: Payer: Medicaid Other

## 2023-07-24 ENCOUNTER — Ambulatory Visit: Payer: Medicaid Other

## 2023-07-28 ENCOUNTER — Emergency Department (HOSPITAL_COMMUNITY)
Admission: EM | Admit: 2023-07-28 | Discharge: 2023-07-28 | Disposition: A | Discharge status: Home / Self Care | Payer: Medicaid Other | Attending: Emergency Medicine | Admitting: Emergency Medicine

## 2023-07-28 ENCOUNTER — Emergency Department (HOSPITAL_COMMUNITY): Payer: Medicaid Other

## 2023-07-28 DIAGNOSIS — Z794 Long term (current) use of insulin: Secondary | ICD-10-CM | POA: Diagnosis not present

## 2023-07-28 DIAGNOSIS — H05221 Edema of right orbit: Secondary | ICD-10-CM | POA: Insufficient documentation

## 2023-07-28 DIAGNOSIS — Z7982 Long term (current) use of aspirin: Secondary | ICD-10-CM | POA: Insufficient documentation

## 2023-07-28 DIAGNOSIS — W01190A Fall on same level from slipping, tripping and stumbling with subsequent striking against furniture, initial encounter: Secondary | ICD-10-CM | POA: Insufficient documentation

## 2023-07-28 DIAGNOSIS — R600 Localized edema: Secondary | ICD-10-CM

## 2023-07-28 DIAGNOSIS — S0083XA Contusion of other part of head, initial encounter: Secondary | ICD-10-CM | POA: Diagnosis not present

## 2023-07-28 DIAGNOSIS — H5789 Other specified disorders of eye and adnexa: Secondary | ICD-10-CM | POA: Diagnosis present

## 2023-07-28 LAB — I-STAT CHEM 8, ED
BUN: 10 mg/dL (ref 6–20)
Calcium, Ion: 1.18 mmol/L (ref 1.15–1.40)
Chloride: 102 mmol/L (ref 98–111)
Creatinine, Ser: 0.6 mg/dL (ref 0.44–1.00)
Glucose, Bld: 206 mg/dL — ABNORMAL HIGH (ref 70–99)
HCT: 41 % (ref 36.0–46.0)
Hemoglobin: 13.9 g/dL (ref 12.0–15.0)
Potassium: 4.2 mmol/L (ref 3.5–5.1)
Sodium: 139 mmol/L (ref 135–145)
TCO2: 26 mmol/L (ref 22–32)

## 2023-07-28 MED ORDER — IOHEXOL 350 MG/ML SOLN
75.0000 mL | Freq: Once | INTRAVENOUS | Status: AC | PRN
  Administered 2023-07-28: 75 mL via INTRAVENOUS

## 2023-07-28 NOTE — ED Provider Notes (Signed)
[place] EMERGENCY DEPARTMENT AT [facility] Provider Note   CSN: [id number] Arrival date & time: 07/28/23  1021     History  Chief Complaint  Patient presents with   Fall   Facial Swelling   Eye Injury    Latoya Roth is a 52 y.o. female presented to ED with a mechanical fall yesterday striking her right forehead on the kitchen counter.  She reports that her vision was fine at that time but she has had significant swelling over her eye since then.  The swelling is gone down with cooling packs today.  HPI     Home Medications Prior to Admission medications   Medication Sig Start Date End Date Taking? Authorizing Provider  acetaminophen (TYLENOL) 325 MG tablet Take 1-2 tablets (325-650 mg total) by mouth every 4 (four) hours as needed for mild pain. 04/24/23   Setzer, Lynnell Jude, PA-C  albuterol-ipratropium (COMBIVENT) 18-103 MCG/ACT inhaler Inhale 2 puffs into the lungs every 6 (six) hours as needed for shortness of breath. Reported on 04/27/2016    [provider]  aspirin EC 81 MG tablet Take 1 tablet (81 mg total) by mouth daily. Swallow whole. 04/25/23   Setzer, Lynnell Jude, PA-C  benzonatate (TESSALON) 100 MG capsule Take 1 capsule (100 mg total) by mouth 2 (two) times daily as needed for cough. 04/24/23   Setzer, Lynnell Jude, PA-C  bictegravir-emtricitabine-tenofovir AF (BIKTARVY) 50-200-25 MG TABS tablet Take 1 tablet by mouth daily. 09/20/22   Gardiner Barefoot, MD  budesonide-formoterol (SYMBICORT) 160-4.5 MCG/ACT inhaler Inhale 2 puffs into the lungs 2 (two) times daily.    [provider]  cetirizine (ZYRTEC) 10 MG tablet Take 10 mg by mouth daily.    [provider]  citalopram (CELEXA) 20 MG tablet Take 1 tablet (20 mg total) by mouth daily. 04/24/23   Setzer, Lynnell Jude, PA-C  Continuous Glucose Receiver (DEXCOM G7 RECEIVER) DEVI 1 each by Does not apply route daily. 05/03/23   Raulkar, Drema Pry, MD  Continuous Glucose Sensor (DEXCOM G7 SENSOR)  MISC 1 each by Does not apply route daily. 05/03/23   Raulkar, Drema Pry, MD  fluticasone (FLONASE) 50 MCG/ACT nasal spray Place 1 spray into both nostrils daily as needed for allergies or rhinitis. 04/24/23   Setzer, Lynnell Jude, PA-C  guaiFENesin-dextromethorphan (ROBITUSSIN DM) 100-10 MG/5ML syrup Take 5-10 mLs by mouth every 6 (six) hours as needed for cough. 04/24/23   Setzer, Lynnell Jude, PA-C  insulin aspart (NOVOLOG) 100 UNIT/ML FlexPen Inject 5 Units into the skin 3 (three) times daily with meals. 04/24/23   Setzer, Lynnell Jude, PA-C  Insulin Pen Needle 32G X 4 MM MISC Use as directed 04/24/23     LANTUS SOLOSTAR 100 UNIT/ML Solostar Pen Inject 14 Units into the skin 2 (two) times daily. 04/24/23   Setzer, Lynnell Jude, PA-C  losartan (COZAAR) 50 MG tablet Take 1 tablet (50 mg total) by mouth daily. 04/24/23   Setzer, Lynnell Jude, PA-C  magnesium gluconate (MAGONATE) 500 MG tablet Take 0.5 tablets (250 mg total) by mouth at bedtime. 04/24/23   Setzer, Lynnell Jude, PA-C  omeprazole (PRILOSEC) 20 MG capsule Take 20 mg by mouth daily.    [provider]  OZEMPIC, 0.25 OR 0.5 MG/DOSE, 2 MG/3ML SOPN Inject 0.5 mg into the skin once a week. 05/03/23   Raulkar, Drema Pry, MD  rosuvastatin (CRESTOR) 20 MG tablet Take 1 tablet (20 mg total) by mouth daily. 04/24/23   Setzer, Lynnell Jude,  PA-C  topiramate (TOPAMAX) 25 MG tablet Take 1 tablet (25 mg total) by mouth at bedtime. 07/02/23   Raulkar, Drema Pry, MD  Vitamin D, Ergocalciferol, (DRISDOL) 1.25 MG (50000 UNIT) CAPS capsule TAKE ONE CAPSULE PUFF EVERY SEVEN DAYS 06/04/23   Raulkar, Drema Pry, MD      Allergies    Penicillins, Shellfish allergy, and Amoxicillin    Review of Systems   Review of Systems  Physical Exam Updated Vital Signs BP (!) 164/70   Pulse 67   Temp 97.6 F (36.4 C) (Oral)   Resp 20   Ht 5\' 7"  (1.702 m)   Wt 113.4 kg   SpO2 100%   BMI 39.16 kg/m  Physical Exam Constitutional:      General: She is not in acute distress. HENT:     Head:  Normocephalic and atraumatic.     Comments: Significant right preseptal hematoma and tissue swelling Eyes:     Extraocular Movements: Extraocular movements intact.     Conjunctiva/sclera: Conjunctivae normal.     Pupils: Pupils are equal, round, and reactive to light.  Cardiovascular:     Rate and Rhythm: Normal rate and regular rhythm.  Pulmonary:     Effort: Pulmonary effort is normal. No respiratory distress.  Abdominal:     General: There is no distension.     Tenderness: There is no abdominal tenderness.  Skin:    General: Skin is warm and dry.  Neurological:     General: No focal deficit present.     Mental Status: She is alert. Mental status is at baseline.  Psychiatric:        Mood and Affect: Mood normal.        Behavior: Behavior normal.     ED Results / Procedures / Treatments   Labs (all labs ordered are listed, but only abnormal results are displayed) Labs Reviewed  I-STAT CHEM 8, ED - Abnormal; Notable for the following components:      Result Value   Glucose, Bld 206 (*)    All other components within normal limits    EKG None  Radiology CT Orbits W Contrast  Result Date: 07/28/2023 CLINICAL DATA:  Orbital trauma with regional swelling. Injury yesterday. EXAM: CT ORBITS WITH CONTRAST TECHNIQUE: Multidetector CT images was performed according to the standard protocol following intravenous contrast administration. RADIATION DOSE REDUCTION: This exam was performed according to the departmental dose-optimization program which includes automated exposure control, adjustment of the mA and/or kV according to patient size and/or use of iterative reconstruction technique. CONTRAST:  75mL OMNIPAQUE IOHEXOL 350 MG/ML SOLN COMPARISON:  05-06-23 FINDINGS: Orbits: Pronounced right-sided periorbital preseptal swelling with a focal hematoma measuring 1.7 cm at the level of the superior orbital rim and more diffuse bruising/edema elsewhere. No postseptal injury. No injury to  either globe. Optic nerves, orbital fat and extraocular muscles appear normal. Visible paranasal sinuses: Normal. No pre-existing inflammatory disease. No acute traumatic finding. Soft tissues: Otherwise normal Osseous: No evidence of regional fracture. Limited intracranial: Normal IMPRESSION: Pronounced right-sided periorbital preseptal swelling with a focal hematoma measuring 1.7 cm at the level of the superior orbital rim and more diffuse bruising/edema elsewhere. No postseptal injury. No injury to either globe. No regional fracture. Electronically Signed   By: Paulina Fusi M.D.   On: 07/28/2023 13:06    Procedures Procedures    Medications Ordered in ED Medications  iohexol (OMNIPAQUE) 350 MG/ML injection 75 mL (75 mLs Intravenous Contrast Given 07/28/23 1301)    ED  Course/ Medical Decision Making/ A&P                                 Medical Decision Making Amount and/or Complexity of Data Reviewed Radiology: ordered.  Risk Prescription drug management.   Patient is here with an isolated injury of the right forehead.  She has a preseptal hematoma.  CT imaging was reviewed and showed small preseptal hematoma and preseptal swelling, which is likely traumatic, but no evidence of globe rupture.  I was able to partially examine her right orbit due to the degree of swelling, and her vision appears intact, I have a low suspicion overall for globe rupture.  Also have a low suspicion intracranial hemorrhage.  The swelling is already improving with cooling packs recommend she continue with that at home, as well as Tylenol as needed.  She is stable for discharge        Final Clinical Impression(s) / ED Diagnoses Final diagnoses:  Contusion of face, initial encounter  Periorbital edema of right eye    Rx / DC Orders ED Discharge Orders     None         Terald Sleeper, MD 07/28/23 1353

## 2023-07-28 NOTE — ED Triage Notes (Signed)
Pt. Stated, I fell on my kitchen floor and hit my face. Rt. Eye swollen  . No LOC. This happened yesterday. Marland Kitchen

## 2023-07-28 NOTE — Discharge Instructions (Addendum)
You can apply cooling packs as needed for the next 2 to 3 days, 10 minutes at a time to your right eye to help with the swelling.  You can take Tylenol at home.  The swelling may take several days to go down.

## 2023-07-30 ENCOUNTER — Other Ambulatory Visit: Payer: Self-pay | Admitting: Physical Medicine and Rehabilitation

## 2023-07-30 ENCOUNTER — Other Ambulatory Visit: Payer: Self-pay | Admitting: Physician Assistant

## 2023-08-08 ENCOUNTER — Ambulatory Visit: Payer: Medicaid Other

## 2023-08-15 ENCOUNTER — Ambulatory Visit: Payer: Medicaid Other

## 2023-08-19 ENCOUNTER — Other Ambulatory Visit: Payer: Self-pay | Admitting: Physician Assistant

## 2023-08-19 DIAGNOSIS — Z1231 Encounter for screening mammogram for malignant neoplasm of breast: Secondary | ICD-10-CM

## 2023-08-22 ENCOUNTER — Ambulatory Visit: Payer: Medicaid Other

## 2023-08-29 ENCOUNTER — Ambulatory Visit: Payer: Medicaid Other

## 2023-09-02 ENCOUNTER — Other Ambulatory Visit: Payer: Self-pay

## 2023-09-02 ENCOUNTER — Encounter
Payer: Medicaid Other | Attending: Physical Medicine and Rehabilitation | Admitting: Physical Medicine and Rehabilitation

## 2023-09-02 VITALS — BP 143/84 | HR 64 | Ht 67.0 in | Wt 259.0 lb

## 2023-09-02 DIAGNOSIS — B20 Human immunodeficiency virus [HIV] disease: Secondary | ICD-10-CM

## 2023-09-02 DIAGNOSIS — Z113 Encounter for screening for infections with a predominantly sexual mode of transmission: Secondary | ICD-10-CM

## 2023-09-02 DIAGNOSIS — Z794 Long term (current) use of insulin: Secondary | ICD-10-CM

## 2023-09-02 DIAGNOSIS — Z79899 Other long term (current) drug therapy: Secondary | ICD-10-CM

## 2023-09-02 DIAGNOSIS — I10 Essential (primary) hypertension: Secondary | ICD-10-CM | POA: Insufficient documentation

## 2023-09-02 DIAGNOSIS — Z6841 Body Mass Index (BMI) 40.0 and over, adult: Secondary | ICD-10-CM | POA: Diagnosis not present

## 2023-09-02 DIAGNOSIS — E1149 Type 2 diabetes mellitus with other diabetic neurological complication: Secondary | ICD-10-CM | POA: Insufficient documentation

## 2023-09-02 NOTE — Progress Notes (Signed)
Subjective:    Patient ID: Latoya Roth, female    DOB: 1971/07/10, 52 y.o.   MRN: [id number]  HPI Latoya Roth is a 52 year old woman who presents for follow-up of CVA, type 2 diabetes and obesity.  1) Type 2 DM -she does not usually eat breakfast -she has been taking short acting insulin -CBGs have been running 190-200 -she has been taking 5 units insulin -she is interested in continuous glucose monitor  2) HTN -BP is elevated  3) CVA -she has been receiving therapy and improving -continues to use the RW -she walks with her cane sometimes  4) Obesity: -she thinks she can get rid of sprite -she would like to continue topamax  Pain Inventory Average Pain 3 Pain Right Now 2 My pain is  intermittent aching  BOWEL Number of stools per week: 3  BLADDER Normal  Mobility walk with assistance use a walker do you drive?  no  Function not employed: date last employed .  Neuro/Psych trouble walking  Prior Studies Any changes since last visit?  no  Physicians involved in your care Any changes since last visit?  no   saw PCP on Wednesday 05/01/23   Family History  Problem Relation Age of Onset   Hypertension Mother    Cancer Son    Social History   Socioeconomic History   Marital status: Legally Separated    Spouse name: Not on file   Number of children: Not on file   Years of education: Not on file   Highest education level: Not on file  Occupational History   Not on file  Tobacco Use   Smoking status: Former   Smokeless tobacco: Never  Substance and Sexual Activity   Alcohol use: Yes    Alcohol/week: 14.0 standard drinks of alcohol    Types: 14 Cans of beer per week    Comment: 2/day   Drug use: No   Sexual activity: Yes    Partners: Male    Birth control/protection: Condom    Comment: accepted condoms  Other Topics Concern   Not on file  Social History Narrative   Not on file   Social Determinants of Health   Financial Resource  Strain: Not on file  Food Insecurity: Food Insecurity Present (04/11/2023)   Hunger Vital Sign    Worried About Running Out of Food in the Last Year: Often true    Ran Out of Food in the Last Year: Often true  Transportation Needs: No Transportation Needs (04/11/2023)   PRAPARE - Administrator, Civil Service (Medical): No    Lack of Transportation (Non-Medical): No  Physical Activity: Not on file  Stress: Not on file  Social Connections: Not on file   Past Surgical History:  Procedure Laterality Date   CHOLECYSTECTOMY     Past Medical History:  Diagnosis Date   Anxiety    Arthritis    Asthma    Bell's palsy    BV (bacterial vaginosis)    Depression    Diabetes mellitus    HIV disease (HCC)    Hypertension    Obesity    Pulse 64   Ht 5\' 7"  (1.702 m)   Wt 259 lb (117.5 kg)   SpO2 97%   BMI 40.57 kg/m  BP recheck 133/82 Opioid Risk Score:   Fall Risk Score:  `1  Depression screen [facility] 2/9     09/02/2023    9:16 AM 07/02/2023  8:49 AM 05/03/2023    9:19 AM 03/21/2023   11:14 AM 03/22/2022   10:13 AM 08/08/2020   10:09 AM 08/13/2017   11:08 AM  Depression screen PHQ 2/9  Decreased Interest 0 0 2 0 0 0 1  Down, Depressed, Hopeless 0 0 0 0 0 1 1  PHQ - 2 Score 0 0 2 0 0 1 2  Altered sleeping   2    0  Tired, decreased energy   0    0  Change in appetite   0    1  Feeling bad or failure about yourself    0    0  Trouble concentrating   0    0  Moving slowly or fidgety/restless   0    1  Suicidal thoughts   0    0  PHQ-9 Score   4    4  Difficult doing work/chores       Somewhat difficult     Review of Systems  Constitutional: Negative.   HENT: Negative.    Eyes: Negative.   Respiratory: Negative.    Cardiovascular: Negative.   Gastrointestinal: Negative.   Endocrine: Negative.   Genitourinary: Negative.   Musculoskeletal:  Positive for back pain and gait problem.  Skin: Negative.   Hematological: Negative.   Psychiatric/Behavioral:  Positive for  dysphoric mood.   All other systems reviewed and are negative.      Objective:   Physical Exam Gen: no distress, normal appearing, weight 259 lbs, BMI 40.57, BP 143/84 HEENT: oral mucosa pink and moist, NCAT Cardio: Reg rate Chest: normal effort, normal rate of breathing Abd: soft, non-distended Ext: no edema Psych: pleasant, normal affect Skin: intact Neuro: Alert and oriented x3 MSK: ambulating with RW     Assessment & Plan:   1) Diabetes: -Dexcom ordered, discussed benefits of using this to learn how different foods affect her blood sugar -encouraged walking after meals -continue novolog 5U TID -avoid sugar, bread, pasta, rice -avoid snacking -perform daily foot exam and at least annual eye exam -try to incorporate into your diet some of the following foods which are good for diabetes: 1) cinnamon- imitates effects of insulin, increasing glucose transport into cells ([place] or [place] cinnamon is best, least processed) 2) nuts- can slow down the blood sugar response of carbohydrate rich foods 3) oatmeal- contains and anti-inflammatory compound avenanthramide 4) whole-milk yogurt (best types are no sugar, [place] yogurt, or goat/sheep yogurt) 5) beans- high in protein, fiber, and vitamins, low glycemic index 6) broccoli- great source of vitamin A and C 7) quinoa- higher in protein and fiber than other grains 8) spinach- high in vitamin A, fiber, and protein 9) olive oil- reduces glucose levels, LDL, and triglycerides 10) salmon- excellent amount of omega-3-fatty acids 11) walnuts- rich in antioxidants 12) apples- high in fiber and quercetin 13) carrots- highly nutritious with low impact on blood sugar 14) eggs- improve HDL (good cholesterol), high in protein, keep you satiated 15) turmeric: improves blood sugars, cardiovascular disease, and protects kidney health 16) garlic: improves blood sugar, blood pressure, pain 17) tomatoes: highly nutritious with low impact on  blood sugar   2) HTN: -BP is 143/84 -Advised checking BP daily at home and logging results to bring into follow-up appointment with PCP and myself. -Reviewed BP meds today.  -Advised checking BP daily at home and logging results to bring into follow-up appointment with PCP and myself. -Reviewed BP meds today.  -Advised regarding healthy foods that  can help lower blood pressure and provided with a list: 1) citrus foods- high in vitamins and minerals 2) salmon and other fatty fish - reduces inflammation and oxylipins 3) swiss chard (leafy green)- high level of nitrates 4) pumpkin seeds- one of the best natural sources of magnesium 5) Beans and lentils- high in fiber, magnesium, and potassium 6) Berries- high in flavonoids 7) Amaranth (whole grain, can be cooked similarly to rice and oats)- high in magnesium and fiber 8) Pistachios- even more effective at reducing BP than other nuts 9) Carrots- high in phenolic compounds that relax blood vessels and reduce inflammation 10) Celery- contain phthalides that relax tissues of arterial walls 11) Tomatoes- can also improve cholesterol and reduce risk of heart disease 12) Broccoli- good source of magnesium, calcium, and potassium 13) Greek yogurt: high in potassium and calcium 14) Herbs and spices: Celery seed, cilantro, saffron, lemongrass, black cumin, ginseng, cinnamon, cardamom, sweet basil, and ginger 15) Chia and flax seeds- also help to lower cholesterol and blood sugar 16) Beets- high levels of nitrates that relax blood vessels  17) spinach and bananas- high in potassium  -Provided lise of supplements that can help with hypertension:  1) magnesium: one high quality brand is Bioptemizers since it contains all 7 types of magnesium, otherwise over the counter magnesium gluconate 400mg  is a good option 2) B vitamins 3) vitamin D 4) potassium 5) CoQ10 6) L-arginine 7) Vitamin C 8) Beetroot -Educated that goal BP is 120/80. -Made goal to  incorporate some of the above foods into diet.    3) CVA -continue HEP -continue RW -discussed that she is not getting out of the house much since it is too hot and her son is at work so can't walk with hi in the mall.   4) Headaches: -prescribed topamax to 25mg  HS  5) Alcoholism:  -advised eliminating alcohol  6) Obesity: -continue topamax -discussed benefits of intermittent fasting -encouraged walk after every meal to maintain leg muscle mass/utilize the glucose from the meals

## 2023-09-03 ENCOUNTER — Other Ambulatory Visit (HOSPITAL_COMMUNITY)
Admission: RE | Admit: 2023-09-03 | Discharge: 2023-09-03 | Disposition: A | Discharge status: Home / Self Care | Payer: Medicaid Other | Source: Ambulatory Visit | Attending: Internal Medicine | Admitting: Internal Medicine

## 2023-09-03 ENCOUNTER — Other Ambulatory Visit: Payer: Medicaid Other

## 2023-09-03 ENCOUNTER — Other Ambulatory Visit: Payer: Self-pay

## 2023-09-03 DIAGNOSIS — Z113 Encounter for screening for infections with a predominantly sexual mode of transmission: Secondary | ICD-10-CM

## 2023-09-03 DIAGNOSIS — B20 Human immunodeficiency virus [HIV] disease: Secondary | ICD-10-CM

## 2023-09-04 ENCOUNTER — Ambulatory Visit: Payer: Medicaid Other

## 2023-09-04 LAB — URINE CYTOLOGY ANCILLARY ONLY
Chlamydia: NEGATIVE
Comment: NEGATIVE
Comment: NORMAL
Neisseria Gonorrhea: NEGATIVE

## 2023-09-05 ENCOUNTER — Ambulatory Visit: Payer: Medicaid Other

## 2023-09-05 LAB — COMPLETE METABOLIC PANEL WITH GFR
AG Ratio: 1.3 (calc) (ref 1.0–2.5)
ALT: 12 U/L (ref 6–29)
AST: 13 U/L (ref 10–35)
Albumin: 4.4 g/dL (ref 3.6–5.1)
Alkaline phosphatase (APISO): 111 U/L (ref 37–153)
BUN: 9 mg/dL (ref 7–25)
CO2: 30 mmol/L (ref 20–32)
Calcium: 9.9 mg/dL (ref 8.6–10.4)
Chloride: 99 mmol/L (ref 98–110)
Creat: 0.64 mg/dL (ref 0.50–1.03)
Globulin: 3.5 g/dL (ref 1.9–3.7)
Glucose, Bld: 175 mg/dL — ABNORMAL HIGH (ref 65–99)
Potassium: 4.4 mmol/L (ref 3.5–5.3)
Sodium: 138 mmol/L (ref 135–146)
Total Bilirubin: 0.4 mg/dL (ref 0.2–1.2)
Total Protein: 7.9 g/dL (ref 6.1–8.1)
eGFR: 106 mL/min/{1.73_m2} (ref >=60)

## 2023-09-05 LAB — CBC WITH DIFFERENTIAL/PLATELET
Absolute Monocytes: 447 {cells}/uL (ref 200–950)
Basophils Absolute: 31 {cells}/uL (ref 0–200)
Basophils Relative: 0.6 %
Eosinophils Absolute: 161 {cells}/uL (ref 15–500)
Eosinophils Relative: 3.1 %
HCT: 39.5 % (ref 35.0–45.0)
Hemoglobin: 12.9 g/dL (ref 11.7–15.5)
Lymphs Abs: 2319 {cells}/uL (ref 850–3900)
MCH: 25.7 pg — ABNORMAL LOW (ref 27.0–33.0)
MCHC: 32.7 g/dL (ref 32.0–36.0)
MCV: 78.7 fL — ABNORMAL LOW (ref 80.0–100.0)
MPV: 9.6 fL (ref 7.5–12.5)
Monocytes Relative: 8.6 %
Neutro Abs: 2241 {cells}/uL (ref 1500–7800)
Neutrophils Relative %: 43.1 %
Platelets: 391 10*3/uL (ref 140–400)
RBC: 5.02 10*6/uL (ref 3.80–5.10)
RDW: 13.2 % (ref 11.0–15.0)
Total Lymphocyte: 44.6 %
WBC: 5.2 10*3/uL (ref 3.8–10.8)

## 2023-09-05 LAB — HIV-1 RNA QUANT-NO REFLEX-BLD
HIV 1 RNA Quant: <20 {copies}/mL — ABNORMAL HIGH
HIV-1 RNA Quant, Log: <1.3 {Log_copies}/mL — ABNORMAL HIGH

## 2023-09-05 LAB — T-HELPER CELL (CD4) - (RCID CLINIC ONLY)
CD4 % Helper T Cell: 33 % (ref 33–65)
CD4 T Cell Abs: 681 /uL (ref 400–1790)

## 2023-09-05 LAB — RPR: RPR Ser Ql: NONREACTIVE

## 2023-09-12 ENCOUNTER — Ambulatory Visit: Payer: Medicaid Other

## 2023-09-17 ENCOUNTER — Telehealth (INDEPENDENT_AMBULATORY_CARE_PROVIDER_SITE_OTHER): Payer: Medicaid Other | Admitting: Internal Medicine

## 2023-09-17 ENCOUNTER — Other Ambulatory Visit: Payer: Self-pay

## 2023-09-17 ENCOUNTER — Encounter: Payer: Self-pay | Admitting: Internal Medicine

## 2023-09-17 VITALS — Ht 67.0 in

## 2023-09-17 DIAGNOSIS — B20 Human immunodeficiency virus [HIV] disease: Secondary | ICD-10-CM | POA: Diagnosis present

## 2023-09-17 MED ORDER — BIKTARVY 50-200-25 MG PO TABS: 1.0000 | ORAL_TABLET | Freq: Every day | ORAL | 11 refills | Status: DC

## 2023-09-17 NOTE — Progress Notes (Signed)
Subjective:   I connected with  Latoya Roth on 09/17/23 by a video enabled telemedicine application and verified that I am speaking with the correct person using two identifiers.   I discussed the limitations of evaluation and management by telemedicine. The patient expressed understanding and agreed to proceed.  Location: Patient - home Physician - clinic  Duration of visit:  16 minutes   Patient ID: Latoya Roth, female    DOB: Jan 10, 1971, 52 y.o.   MRN: [id number]  HPI Latoya Roth is connected for follow up of HIV She has had no new issues.  No issues getting or taking her Biktarvy.     Review of Systems  Constitutional:  Negative for fatigue.  Gastrointestinal:  Negative for diarrhea and nausea.       Objective:   Physical Exam Eyes:     General: No scleral icterus. Pulmonary:     Effort: Pulmonary effort is normal.  Neurological:     Mental Status: She is alert.           Assessment & Plan:

## 2023-09-17 NOTE — Assessment & Plan Note (Signed)
She continues to do well.  Labs reviewed with her.  No concerns and refills provided.   She will continue with routine follow up in 6 months.

## 2023-09-19 ENCOUNTER — Ambulatory Visit: Payer: Medicaid Other

## 2023-09-26 ENCOUNTER — Other Ambulatory Visit (HOSPITAL_COMMUNITY): Payer: Self-pay

## 2023-10-25 ENCOUNTER — Other Ambulatory Visit: Payer: Self-pay | Admitting: Physical Medicine and Rehabilitation

## 2023-12-03 ENCOUNTER — Encounter
Payer: Medicaid Other | Attending: Physical Medicine and Rehabilitation | Admitting: Physical Medicine and Rehabilitation

## 2023-12-03 ENCOUNTER — Encounter: Payer: Self-pay | Admitting: Physical Medicine and Rehabilitation

## 2023-12-03 VITALS — BP 130/77 | HR 80 | Ht 67.0 in | Wt 273.0 lb

## 2023-12-03 DIAGNOSIS — G4730 Sleep apnea, unspecified: Secondary | ICD-10-CM | POA: Insufficient documentation

## 2023-12-03 DIAGNOSIS — Z6841 Body Mass Index (BMI) 40.0 and over, adult: Secondary | ICD-10-CM | POA: Diagnosis present

## 2023-12-03 DIAGNOSIS — I639 Cerebral infarction, unspecified: Secondary | ICD-10-CM | POA: Insufficient documentation

## 2023-12-03 DIAGNOSIS — E66813 Obesity, class 3: Secondary | ICD-10-CM | POA: Diagnosis not present

## 2023-12-03 NOTE — Progress Notes (Signed)
Subjective:    Patient ID: Latoya Roth, female    DOB: 06-26-1971, 52 y.o.   MRN: [id number]  HPI Latoya Roth is a 52 year old woman who presents for follow-up of CVA, type 2 diabetes and obesity.  1) Type 2 DM -she does not usually eat breakfast -she has been taking short acting insulin -CBGs have been running 190-200 -she has been taking 5 units insulin -she is interested in continuous glucose monitor  2) HTN -BP is elevated  3) CVA -she has been receiving therapy and improving -continues to use the RW -she walks with her cane sometimes -needs wheelchair to be adjusted  4) Obesity: -she thinks she can get rid of sprite -she would like to continue topamax  Pain Inventory Average Pain 3 Pain Right Now 2 My pain is  intermittent aching  BOWEL Number of stools per week: 3  BLADDER Normal  Mobility walk with assistance use a cane use a walker do you drive?  no  Function disabled: date disabled 10 yrs maybe  Neuro/Psych trouble walking  Prior Studies Any changes since last visit?  no  Physicians involved in your care Any changes since last visit?  no     Family History  Problem Relation Age of Onset   Hypertension Mother    Cancer Son    Social History   Socioeconomic History   Marital status: Legally Separated    Spouse name: Not on file   Number of children: Not on file   Years of education: Not on file   Highest education level: Not on file  Occupational History   Not on file  Tobacco Use   Smoking status: Former   Smokeless tobacco: Never  Substance and Sexual Activity   Alcohol use: Yes    Alcohol/week: 14.0 standard drinks of alcohol    Types: 14 Cans of beer per week    Comment: 2/day   Drug use: No   Sexual activity: Yes    Partners: Male    Birth control/protection: Condom    Comment: accepted condoms  Other Topics Concern   Not on file  Social History Narrative   Not on file   Social Determinants of Health    Financial Resource Strain: Not on file  Food Insecurity: Food Insecurity Present (04/11/2023)   Hunger Vital Sign    Worried About Running Out of Food in the Last Year: Often true    Ran Out of Food in the Last Year: Often true  Transportation Needs: No Transportation Needs (04/11/2023)   PRAPARE - Administrator, Civil Service (Medical): No    Lack of Transportation (Non-Medical): No  Physical Activity: Not on file  Stress: Not on file  Social Connections: Not on file   Past Surgical History:  Procedure Laterality Date   CHOLECYSTECTOMY     Past Medical History:  Diagnosis Date   Anxiety    Arthritis    Asthma    Bell's palsy    BV (bacterial vaginosis)    Depression    Diabetes mellitus    HIV disease (HCC)    Hypertension    Obesity    Ht 5\' 7"  (1.702 m)   BMI 40.57 kg/m  BP recheck 133/82 Opioid Risk Score:   Fall Risk Score:  `1  Depression screen [facility] 2/9     09/17/2023    9:22 AM 09/02/2023    9:16 AM 07/02/2023    8:49 AM 05/03/2023  9:19 AM 03/21/2023   11:14 AM 03/22/2022   10:13 AM 08/08/2020   10:09 AM  Depression screen PHQ 2/9  Decreased Interest 0 0 0 2 0 0 0  Down, Depressed, Hopeless 0 0 0 0 0 0 1  PHQ - 2 Score 0 0 0 2 0 0 1  Altered sleeping    2     Tired, decreased energy    0     Change in appetite    0     Feeling bad or failure about yourself     0     Trouble concentrating    0     Moving slowly or fidgety/restless    0     Suicidal thoughts    0     PHQ-9 Score    4        Review of Systems  Constitutional: Negative.   HENT: Negative.    Eyes: Negative.   Respiratory: Negative.    Cardiovascular: Negative.   Gastrointestinal: Negative.   Endocrine: Negative.   Genitourinary: Negative.   Musculoskeletal: Negative.  Negative for back pain and gait problem.  Skin: Negative.   Neurological:  Positive for headaches.  Hematological: Negative.   Psychiatric/Behavioral:  Negative for dysphoric mood.   All other systems  reviewed and are negative.      Objective:   Physical Exam Gen: no distress, normal appearing, weight 259 lbs, BMI 40.57, BP 143/84 HEENT: oral mucosa pink and moist, NCAT Cardio: Reg rate Chest: normal effort, normal rate of breathing Abd: soft, non-distended Ext: no edema Psych: pleasant, normal affect Skin: intact Neuro: Alert and oriented x3 MSK: ambulating with RW     Assessment & Plan:   1) Diabetes: -Dexcom ordered, discussed benefits of using this to learn how different foods affect her blood sugar -encouraged walking after meals -continue novolog 5U TID -avoid sugar, bread, pasta, rice -avoid snacking -perform daily foot exam and at least annual eye exam -try to incorporate into your diet some of the following foods which are good for diabetes: 1) cinnamon- imitates effects of insulin, increasing glucose transport into cells ([place] or [place] cinnamon is best, least processed) 2) nuts- can slow down the blood sugar response of carbohydrate rich foods 3) oatmeal- contains and anti-inflammatory compound avenanthramide 4) whole-milk yogurt (best types are no sugar, [place] yogurt, or goat/sheep yogurt) 5) beans- high in protein, fiber, and vitamins, low glycemic index 6) broccoli- great source of vitamin A and C 7) quinoa- higher in protein and fiber than other grains 8) spinach- high in vitamin A, fiber, and protein 9) olive oil- reduces glucose levels, LDL, and triglycerides 10) salmon- excellent amount of omega-3-fatty acids 11) walnuts- rich in antioxidants 12) apples- high in fiber and quercetin 13) carrots- highly nutritious with low impact on blood sugar 14) eggs- improve HDL (good cholesterol), high in protein, keep you satiated 15) turmeric: improves blood sugars, cardiovascular disease, and protects kidney health 16) garlic: improves blood sugar, blood pressure, pain 17) tomatoes: highly nutritious with low impact on blood sugar   2) HTN: -BP is  143/84 -Advised checking BP daily at home and logging results to bring into follow-up appointment with PCP and myself. -Reviewed BP meds today.  -Advised checking BP daily at home and logging results to bring into follow-up appointment with PCP and myself. -Reviewed BP meds today.  -Advised regarding healthy foods that can help lower blood pressure and provided with a list: 1) citrus foods- high in vitamins  and minerals 2) salmon and other fatty fish - reduces inflammation and oxylipins 3) swiss chard (leafy green)- high level of nitrates 4) pumpkin seeds- one of the best natural sources of magnesium 5) Beans and lentils- high in fiber, magnesium, and potassium 6) Berries- high in flavonoids 7) Amaranth (whole grain, can be cooked similarly to rice and oats)- high in magnesium and fiber 8) Pistachios- even more effective at reducing BP than other nuts 9) Carrots- high in phenolic compounds that relax blood vessels and reduce inflammation 10) Celery- contain phthalides that relax tissues of arterial walls 11) Tomatoes- can also improve cholesterol and reduce risk of heart disease 12) Broccoli- good source of magnesium, calcium, and potassium 13) Greek yogurt: high in potassium and calcium 14) Herbs and spices: Celery seed, cilantro, saffron, lemongrass, black cumin, ginseng, cinnamon, cardamom, sweet basil, and ginger 15) Chia and flax seeds- also help to lower cholesterol and blood sugar 16) Beets- high levels of nitrates that relax blood vessels  17) spinach and bananas- high in potassium  -Provided lise of supplements that can help with hypertension:  1) magnesium: one high quality brand is Bioptemizers since it contains all 7 types of magnesium, otherwise over the counter magnesium gluconate 400mg  is a good option 2) B vitamins 3) vitamin D 4) potassium 5) CoQ10 6) L-arginine 7) Vitamin C 8) Beetroot -Educated that goal BP is 120/80. -Made goal to incorporate some of the above  foods into diet.    3) CVA -continue HEP -continue RW -discussed that she is not getting out of the house much since it is too hot and her son is at work so can't walk with hi in the mall.  -referred to PT for WC eval  4) Headaches: -prescribed topamax to 25mg  HS  5) Alcoholism:  -advised eliminating alcohol  6) Obesity: -continue topamax -discussed benefits of intermittent fasting -encouraged walk after every meal to maintain leg muscle mass/utilize the glucose from the meals -Educated regarding health benefits of weight loss- for pain, general health, chronic disease prevention, immune health, mental health.  -Will monitor weight every visit.  -Consider [place] tea daily.  -Discussed the benefits of intermittent fasting. -Discussed foods that can assist in weight loss: 1) leafy greens- high in fiber and nutrients 2) dark chocolate- improves metabolism (if prefer sweetened, best to sweeten with honey instead of sugar).  3) cruciferous vegetables- high in fiber and protein 4) full fat yogurt: high in healthy fat, protein, calcium, and probiotics 5) apples- high in a variety of phytochemicals 6) nuts- high in fiber and protein that increase feelings of fullness 7) grapefruit: rich in nutrients, antioxidants, and fiber (not to be taken with anticoagulation) 8) beans- high in protein and fiber 9) salmon- has high quality protein and healthy fats 10) green tea- rich in polyphenols 11) eggs- rich in choline and vitamin D 12) tuna- high protein, boosts metabolism 13) avocado- decreases visceral abdominal fat 14) chicken (pasture raised): high in protein and iron 15) blueberries- reduce abdominal fat and cholesterol 16) whole grains- decreases calories retained during digestion, speeds metabolism 17) chia seeds- curb appetite 18) chilies- increases fat metabolism  -Discussed supplements that can be used:  1) Metatrim 400mg  BID 30 minutes before breakfast and dinner  2)  Sphaeranthus indicus and Garcinia mangostana (combinations of these and #1 can be found in capsicum and zychrome  3) green coffee bean extract 400mg  twice per day or Irvingia (african mango) 150 to 300mg  twice per day.  7) Headaches:  -referred  to pulmonology for sleep apnea eval

## 2024-02-10 ENCOUNTER — Emergency Department (HOSPITAL_COMMUNITY): Payer: Medicaid Other

## 2024-02-10 ENCOUNTER — Other Ambulatory Visit: Payer: Self-pay

## 2024-02-10 ENCOUNTER — Inpatient Hospital Stay (HOSPITAL_COMMUNITY)
Admission: EM | Admit: 2024-02-10 | Discharge: 2024-02-14 | DRG: 193 | Disposition: A | Discharge status: Home / Self Care | Payer: Medicaid Other | Attending: Internal Medicine | Admitting: Internal Medicine

## 2024-02-10 DIAGNOSIS — Z1152 Encounter for screening for COVID-19: Secondary | ICD-10-CM | POA: Diagnosis not present

## 2024-02-10 DIAGNOSIS — Z87891 Personal history of nicotine dependence: Secondary | ICD-10-CM | POA: Diagnosis not present

## 2024-02-10 DIAGNOSIS — I10 Essential (primary) hypertension: Secondary | ICD-10-CM | POA: Diagnosis present

## 2024-02-10 DIAGNOSIS — Z6841 Body Mass Index (BMI) 40.0 and over, adult: Secondary | ICD-10-CM | POA: Diagnosis not present

## 2024-02-10 DIAGNOSIS — Z9049 Acquired absence of other specified parts of digestive tract: Secondary | ICD-10-CM | POA: Diagnosis not present

## 2024-02-10 DIAGNOSIS — J189 Pneumonia, unspecified organism: Secondary | ICD-10-CM

## 2024-02-10 DIAGNOSIS — J09X1 Influenza due to identified novel influenza A virus with pneumonia: Secondary | ICD-10-CM | POA: Diagnosis present

## 2024-02-10 DIAGNOSIS — K219 Gastro-esophageal reflux disease without esophagitis: Secondary | ICD-10-CM | POA: Diagnosis present

## 2024-02-10 DIAGNOSIS — R6510 Systemic inflammatory response syndrome (SIRS) of non-infectious origin without acute organ dysfunction: Secondary | ICD-10-CM

## 2024-02-10 DIAGNOSIS — Z7984 Long term (current) use of oral hypoglycemic drugs: Secondary | ICD-10-CM

## 2024-02-10 DIAGNOSIS — T380X5A Adverse effect of glucocorticoids and synthetic analogues, initial encounter: Secondary | ICD-10-CM | POA: Diagnosis present

## 2024-02-10 DIAGNOSIS — Z21 Asymptomatic human immunodeficiency virus [HIV] infection status: Secondary | ICD-10-CM | POA: Diagnosis present

## 2024-02-10 DIAGNOSIS — E1165 Type 2 diabetes mellitus with hyperglycemia: Secondary | ICD-10-CM | POA: Diagnosis not present

## 2024-02-10 DIAGNOSIS — J45901 Unspecified asthma with (acute) exacerbation: Secondary | ICD-10-CM | POA: Diagnosis present

## 2024-02-10 DIAGNOSIS — Z794 Long term (current) use of insulin: Secondary | ICD-10-CM | POA: Diagnosis not present

## 2024-02-10 DIAGNOSIS — Z880 Allergy status to penicillin: Secondary | ICD-10-CM

## 2024-02-10 DIAGNOSIS — E876 Hypokalemia: Secondary | ICD-10-CM | POA: Diagnosis present

## 2024-02-10 DIAGNOSIS — Z7902 Long term (current) use of antithrombotics/antiplatelets: Secondary | ICD-10-CM

## 2024-02-10 DIAGNOSIS — Z8249 Family history of ischemic heart disease and other diseases of the circulatory system: Secondary | ICD-10-CM

## 2024-02-10 DIAGNOSIS — Z8673 Personal history of transient ischemic attack (TIA), and cerebral infarction without residual deficits: Secondary | ICD-10-CM

## 2024-02-10 DIAGNOSIS — E871 Hypo-osmolality and hyponatremia: Secondary | ICD-10-CM | POA: Diagnosis present

## 2024-02-10 DIAGNOSIS — E66813 Obesity, class 3: Secondary | ICD-10-CM | POA: Diagnosis present

## 2024-02-10 DIAGNOSIS — Z79899 Other long term (current) drug therapy: Secondary | ICD-10-CM

## 2024-02-10 DIAGNOSIS — Z91013 Allergy to seafood: Secondary | ICD-10-CM

## 2024-02-10 DIAGNOSIS — B20 Human immunodeficiency virus [HIV] disease: Secondary | ICD-10-CM | POA: Diagnosis present

## 2024-02-10 DIAGNOSIS — J9601 Acute respiratory failure with hypoxia: Secondary | ICD-10-CM | POA: Diagnosis present

## 2024-02-10 DIAGNOSIS — J1000 Influenza due to other identified influenza virus with unspecified type of pneumonia: Secondary | ICD-10-CM | POA: Diagnosis present

## 2024-02-10 DIAGNOSIS — D509 Iron deficiency anemia, unspecified: Secondary | ICD-10-CM | POA: Diagnosis present

## 2024-02-10 DIAGNOSIS — Z7951 Long term (current) use of inhaled steroids: Secondary | ICD-10-CM | POA: Diagnosis not present

## 2024-02-10 DIAGNOSIS — J4521 Mild intermittent asthma with (acute) exacerbation: Secondary | ICD-10-CM | POA: Diagnosis not present

## 2024-02-10 DIAGNOSIS — Z7982 Long term (current) use of aspirin: Secondary | ICD-10-CM

## 2024-02-10 LAB — CBC WITH DIFFERENTIAL/PLATELET
Abs Immature Granulocytes: 0.16 10*3/uL — ABNORMAL HIGH (ref 0.00–0.07)
Basophils Absolute: 0.1 10*3/uL (ref 0.0–0.1)
Basophils Relative: 0 %
Eosinophils Absolute: 0 10*3/uL (ref 0.0–0.5)
Eosinophils Relative: 0 %
HCT: 34.2 % — ABNORMAL LOW (ref 36.0–46.0)
Hemoglobin: 11 g/dL — ABNORMAL LOW (ref 12.0–15.0)
Immature Granulocytes: 1 %
Lymphocytes Relative: 18 %
Lymphs Abs: 2.9 10*3/uL (ref 0.7–4.0)
MCH: 25.6 pg — ABNORMAL LOW (ref 26.0–34.0)
MCHC: 32.2 g/dL (ref 30.0–36.0)
MCV: 79.5 fL — ABNORMAL LOW (ref 80.0–100.0)
Monocytes Absolute: 0.8 10*3/uL (ref 0.1–1.0)
Monocytes Relative: 5 %
Neutro Abs: 12.1 10*3/uL — ABNORMAL HIGH (ref 1.7–7.7)
Neutrophils Relative %: 76 %
Platelets: 368 10*3/uL (ref 150–400)
RBC: 4.3 MIL/uL (ref 3.87–5.11)
RDW: 14.4 % (ref 11.5–15.5)
WBC: 16 10*3/uL — ABNORMAL HIGH (ref 4.0–10.5)
nRBC: 0 % (ref 0.0–0.2)

## 2024-02-10 LAB — COMPREHENSIVE METABOLIC PANEL
ALT: 30 U/L (ref 0–44)
AST: 51 U/L — ABNORMAL HIGH (ref 15–41)
Albumin: 2.5 g/dL — ABNORMAL LOW (ref 3.5–5.0)
Alkaline Phosphatase: 64 U/L (ref 38–126)
Anion gap: 14 (ref 5–15)
BUN: 9 mg/dL (ref 6–20)
CO2: 23 mmol/L (ref 22–32)
Calcium: 8 mg/dL — ABNORMAL LOW (ref 8.9–10.3)
Chloride: 96 mmol/L — ABNORMAL LOW (ref 98–111)
Creatinine, Ser: 0.86 mg/dL (ref 0.44–1.00)
GFR, Estimated: >60 mL/min (ref >60)
Glucose, Bld: 91 mg/dL (ref 70–99)
Potassium: 3 mmol/L — ABNORMAL LOW (ref 3.5–5.1)
Sodium: 133 mmol/L — ABNORMAL LOW (ref 135–145)
Total Bilirubin: 0.9 mg/dL (ref 0.0–1.2)
Total Protein: 7.6 g/dL (ref 6.5–8.1)

## 2024-02-10 LAB — RESP PANEL BY RT-PCR (RSV, FLU A&B, COVID)  RVPGX2
Influenza A by PCR: POSITIVE — AB
Influenza B by PCR: NEGATIVE
Resp Syncytial Virus by PCR: NEGATIVE
SARS Coronavirus 2 by RT PCR: NEGATIVE

## 2024-02-10 MED ORDER — SODIUM CHLORIDE 0.9 % IV SOLN
1.0000 g | Freq: Once | INTRAVENOUS | Status: AC
  Administered 2024-02-10: 1 g via INTRAVENOUS
  Filled 2024-02-10: qty 10

## 2024-02-10 MED ORDER — METHYLPREDNISOLONE SODIUM SUCC 125 MG IJ SOLR
125.0000 mg | Freq: Once | INTRAMUSCULAR | Status: AC
  Administered 2024-02-10: 125 mg via INTRAVENOUS
  Filled 2024-02-10: qty 2

## 2024-02-10 MED ORDER — ACETAMINOPHEN 500 MG PO TABS
1000.0000 mg | ORAL_TABLET | Freq: Once | ORAL | Status: AC
  Administered 2024-02-10: 1000 mg via ORAL
  Filled 2024-02-10: qty 2

## 2024-02-10 MED ORDER — SODIUM CHLORIDE 0.9 % IV SOLN
500.0000 mg | Freq: Once | INTRAVENOUS | Status: AC
  Administered 2024-02-10: 500 mg via INTRAVENOUS
  Filled 2024-02-10: qty 5

## 2024-02-10 MED ORDER — OSELTAMIVIR PHOSPHATE 75 MG PO CAPS
75.0000 mg | ORAL_CAPSULE | Freq: Once | ORAL | Status: AC
  Administered 2024-02-10: 75 mg via ORAL
  Filled 2024-02-10: qty 1

## 2024-02-10 MED ORDER — IPRATROPIUM-ALBUTEROL 0.5-2.5 (3) MG/3ML IN SOLN
3.0000 mL | RESPIRATORY_TRACT | Status: AC
  Administered 2024-02-10 (×3): 3 mL via RESPIRATORY_TRACT
  Filled 2024-02-10 (×2): qty 3

## 2024-02-10 MED ORDER — POTASSIUM CHLORIDE 20 MEQ PO PACK
40.0000 meq | PACK | Freq: Once | ORAL | Status: AC
  Administered 2024-02-10: 40 meq via ORAL
  Filled 2024-02-10: qty 2

## 2024-02-10 MED ORDER — MAGNESIUM SULFATE 2 GM/50ML IV SOLN
2.0000 g | Freq: Once | INTRAVENOUS | Status: AC
  Administered 2024-02-10: 2 g via INTRAVENOUS
  Filled 2024-02-10: qty 50

## 2024-02-10 NOTE — ED Triage Notes (Signed)
Patient reports worsening SOB with dry cough and chest congestion for several weeks , diagnosed with pneumonia today by her PCP , febrile at triage .

## 2024-02-10 NOTE — ED Provider Triage Note (Signed)
Emergency Medicine Provider Triage Evaluation Note  Latoya Roth , a 53 y.o. female  was evaluated in triage.  Pt complains of cough and shortness of breath and chest congestion.  Started several weeks ago.  Was seen by her PCP and diagnosed with pneumonia today.  Patient states that she was supposed to be started on antibiotics but unsure if the antibiotics were sent to her pharmacy.  She is here because she is more short of breath and the coughing is persistent.  Review of Systems  Positive: See above Negative: See above  Physical Exam  BP (!) 158/73 (BP Location: Left Arm)   Pulse 92   Temp (!) 101.1 F (38.4 C) (Oral)   Resp (!) 22   SpO2 94%  Gen:   Awake, no distress   Resp:  Tachypneic, diffuse wheezing MSK:   Moves extremities without difficulty  Other:    Medical Decision Making  Medically screening exam initiated at 7:17 PM.  Appropriate orders placed.  Latoya Roth was informed that the remainder of the evaluation will be completed by another provider, this initial triage assessment does not replace that evaluation, and the importance of remaining in the ED until their evaluation is complete.  Work up started   Latoya Eagle, PA-C 02/10/24 1918

## 2024-02-10 NOTE — ED Provider Notes (Signed)
[place] [facility] DEPARTMENT AT [facility] Provider Note  Arrival date/time:02/10/2024 10:42 PM  HPI/ROS   Latoya Roth is a 53 y.o. female with PMH significant for asthma, HTN, HIV, T2DM, who presents for SOB  History is provided by patient.  Patient has been having cough congestion for the past 2 weeks and recently started having shortness of breath.  Went to her PCP today was told she had pneumonia and was reportedly hypoxic and was sent to the emergency department. Has not taking any breathing treatments today, although she has been self treating at home with her inhalers.  Denies fevers or chills, does endorse chest tightness.  A complete ROS was performed with pertinent positives/negatives noted above.   ED Course and Medical Decision Making   I personally reviewed the patient's vitals.  Assessment/Plan: This is a 53 year old patient with history of asthma who is presenting for shortness of breath and reported hypoxia while at PCP today.  On presentation to emergency department, patient does have wheezing on exam and become short of breath with talking.  She is satting 94 to 95% on room air.  Workup: CMP shows mild hyponatremia to 133 and mild hypokalemia to 3.0, no AKI, no liver injury CBC shows mild cytosis to 16, hemoglobin is 11 Respiratory panel and is influenza A positive Chest x-ray concerning for multi-focal pneumonia Blood cultures obtained  Patient treated with 3 back-to-back DuoNebs, IV magnesium, IV Solu-Medrol.  Plan to start patient on broad-spectrum antibiotic coverage with Rocephin and azithromycin as well as Tamiflu in the setting of influenza A infection.  Will admit patient to hospitalist for treatment of her multifocal pneumonia in the setting of influenza A and asthma exacerbation.  Disposition:  I discussed the case with hospitalist team who graciously agreed to admit the patient to their service for continued care.   Clinical  Impression:  1. Exacerbation of asthma, unspecified asthma severity, unspecified whether persistent   2. Pneumonia of both lungs due to infectious organism, unspecified part of lung     Rx / DC Orders ED Discharge Orders     None       The plan for this patient was discussed with Dr. Silverio Lay, who voiced agreement and who oversaw evaluation and treatment of this patient.   Clinical Complexity A medically appropriate history, review of systems, and physical exam was performed.  Patient's presentation is most consistent with acute presentation with potential threat to life or bodily function.  Medical Decision Making Risk Prescription drug management. Decision regarding hospitalization.    Physical Exam and Medical History   Vitals:   02/10/24 1905 02/10/24 2042  BP: (!) 158/73 126/88  Pulse: 92 86  Resp: (!) 22 20  Temp: (!) 101.1 F (38.4 C) 99.1 F (37.3 C)  TempSrc: Oral Oral  SpO2: 94% 90%    Physical Exam Vitals and nursing note reviewed.  Constitutional:      General: She is not in acute distress.    Appearance: She is well-developed.  HENT:     Head: Normocephalic and atraumatic.  Eyes:     Conjunctiva/sclera: Conjunctivae normal.  Cardiovascular:     Rate and Rhythm: Normal rate and regular rhythm.     Heart sounds: No murmur heard. Pulmonary:     Effort: Pulmonary effort is normal. No respiratory distress.     Breath sounds: Wheezing present.  Abdominal:     Palpations: Abdomen is soft.     Tenderness: There is no abdominal tenderness.  Musculoskeletal:  General: No swelling.     Cervical back: Neck supple.  Skin:    General: Skin is warm and dry.     Capillary Refill: Capillary refill takes less than 2 seconds.  Neurological:     Mental Status: She is alert.  Psychiatric:        Mood and Affect: Mood normal.     Medical History: Allergies  Allergen Reactions   Penicillins Anaphylaxis   Shellfish Allergy Anaphylaxis, Hives and  Swelling   Amoxicillin Rash   Past Medical History:  Diagnosis Date   Anxiety    Arthritis    Asthma    Bell's palsy    BV (bacterial vaginosis)    Depression    Diabetes mellitus    HIV disease (HCC)    Hypertension    Obesity     Past Surgical History:  Procedure Laterality Date   CHOLECYSTECTOMY     Family History  Problem Relation Age of Onset   Hypertension Mother    Cancer Son     Social History   Tobacco Use   Smoking status: Former   Smokeless tobacco: Never  Vaping Use   Vaping status: Never Used  Substance Use Topics   Alcohol use: Yes    Alcohol/week: 14.0 standard drinks of alcohol    Types: 14 Cans of beer per week    Comment: 2/day   Drug use: No    Procedures   If procedures were preformed on this patient, they are listed below:  Procedures   -------- HPI and MDM generated using voice dictation software and may contain dictation errors. Please contact me for any clarification or with any questions.   Cephus Slater, MD Emergency Medicine PGY-2    Caron Presume, MD 02/10/24 2242    Charlynne Pander, MD 02/11/24 1059

## 2024-02-11 ENCOUNTER — Encounter (HOSPITAL_COMMUNITY): Payer: Self-pay | Admitting: Internal Medicine

## 2024-02-11 DIAGNOSIS — E876 Hypokalemia: Secondary | ICD-10-CM

## 2024-02-11 DIAGNOSIS — J09X1 Influenza due to identified novel influenza A virus with pneumonia: Secondary | ICD-10-CM

## 2024-02-11 DIAGNOSIS — J9601 Acute respiratory failure with hypoxia: Secondary | ICD-10-CM

## 2024-02-11 DIAGNOSIS — R6510 Systemic inflammatory response syndrome (SIRS) of non-infectious origin without acute organ dysfunction: Secondary | ICD-10-CM

## 2024-02-11 LAB — COMPREHENSIVE METABOLIC PANEL
ALT: 30 U/L (ref 0–44)
AST: 37 U/L (ref 15–41)
Albumin: 2.3 g/dL — ABNORMAL LOW (ref 3.5–5.0)
Alkaline Phosphatase: 67 U/L (ref 38–126)
Anion gap: 17 — ABNORMAL HIGH (ref 5–15)
BUN: 14 mg/dL (ref 6–20)
CO2: 20 mmol/L — ABNORMAL LOW (ref 22–32)
Calcium: 8.1 mg/dL — ABNORMAL LOW (ref 8.9–10.3)
Chloride: 97 mmol/L — ABNORMAL LOW (ref 98–111)
Creatinine, Ser: 0.96 mg/dL (ref 0.44–1.00)
GFR, Estimated: >60 mL/min (ref >60)
Glucose, Bld: 430 mg/dL — ABNORMAL HIGH (ref 70–99)
Potassium: 4.2 mmol/L (ref 3.5–5.1)
Sodium: 134 mmol/L — ABNORMAL LOW (ref 135–145)
Total Bilirubin: 1.1 mg/dL (ref 0.0–1.2)
Total Protein: 7.5 g/dL (ref 6.5–8.1)

## 2024-02-11 LAB — PROCALCITONIN: Procalcitonin: 0.36 ng/mL

## 2024-02-11 LAB — CBC
HCT: 35 % — ABNORMAL LOW (ref 36.0–46.0)
Hemoglobin: 11 g/dL — ABNORMAL LOW (ref 12.0–15.0)
MCH: 25.6 pg — ABNORMAL LOW (ref 26.0–34.0)
MCHC: 31.4 g/dL (ref 30.0–36.0)
MCV: 81.6 fL (ref 80.0–100.0)
Platelets: 323 10*3/uL (ref 150–400)
RBC: 4.29 MIL/uL (ref 3.87–5.11)
RDW: 14.6 % (ref 11.5–15.5)
WBC: 12.2 10*3/uL — ABNORMAL HIGH (ref 4.0–10.5)
nRBC: 0 % (ref 0.0–0.2)

## 2024-02-11 LAB — GLUCOSE, CAPILLARY
Glucose-Capillary: 438 mg/dL — ABNORMAL HIGH (ref 70–99)
Glucose-Capillary: 312 mg/dL — ABNORMAL HIGH (ref 70–99)

## 2024-02-11 LAB — HEMOGLOBIN A1C
Hgb A1c MFr Bld: 7.7 % — ABNORMAL HIGH (ref 4.8–5.6)
Mean Plasma Glucose: 174.29 mg/dL

## 2024-02-11 LAB — GLUCOSE, RANDOM: Glucose, Bld: 469 mg/dL — ABNORMAL HIGH (ref 70–99)

## 2024-02-11 LAB — CBG MONITORING, ED
Glucose-Capillary: 444 mg/dL — ABNORMAL HIGH (ref 70–99)
Glucose-Capillary: 394 mg/dL — ABNORMAL HIGH (ref 70–99)

## 2024-02-11 LAB — I-STAT CG4 LACTIC ACID, ED: Lactic Acid, Venous: 0.9 mmol/L (ref 0.5–1.9)

## 2024-02-11 LAB — MAGNESIUM: Magnesium: 2.1 mg/dL (ref 1.7–2.4)

## 2024-02-11 MED ORDER — INSULIN ASPART 100 UNIT/ML IJ SOLN
0.0000 [IU] | Freq: Every day | INTRAMUSCULAR | Status: DC
  Administered 2024-02-11: 4 [IU] via SUBCUTANEOUS
  Administered 2024-02-12: 3 [IU] via SUBCUTANEOUS

## 2024-02-11 MED ORDER — INSULIN ASPART 100 UNIT/ML IJ SOLN
25.0000 [IU] | Freq: Once | INTRAMUSCULAR | Status: AC
  Administered 2024-02-11: 25 [IU] via SUBCUTANEOUS

## 2024-02-11 MED ORDER — ENOXAPARIN SODIUM 40 MG/0.4ML IJ SOSY
40.0000 mg | PREFILLED_SYRINGE | INTRAMUSCULAR | Status: DC
  Administered 2024-02-11: 40 mg via SUBCUTANEOUS
  Filled 2024-02-11: qty 0.4

## 2024-02-11 MED ORDER — CLOPIDOGREL BISULFATE 75 MG PO TABS
75.0000 mg | ORAL_TABLET | Freq: Every day | ORAL | Status: DC
  Administered 2024-02-11 – 2024-02-14 (×4): 75 mg via ORAL
  Filled 2024-02-11 (×5): qty 1

## 2024-02-11 MED ORDER — ALBUTEROL SULFATE (2.5 MG/3ML) 0.083% IN NEBU
2.5000 mg | INHALATION_SOLUTION | RESPIRATORY_TRACT | Status: DC | PRN
  Administered 2024-02-11: 2.5 mg via RESPIRATORY_TRACT
  Filled 2024-02-11 (×2): qty 3

## 2024-02-11 MED ORDER — METHYLPREDNISOLONE SODIUM SUCC 125 MG IJ SOLR
60.0000 mg | Freq: Two times a day (BID) | INTRAMUSCULAR | Status: DC
  Administered 2024-02-11 – 2024-02-13 (×5): 60 mg via INTRAVENOUS
  Filled 2024-02-11 (×5): qty 2

## 2024-02-11 MED ORDER — BICTEGRAVIR-EMTRICITAB-TENOFOV 50-200-25 MG PO TABS
1.0000 | ORAL_TABLET | Freq: Every day | ORAL | Status: DC
  Administered 2024-02-12 – 2024-02-14 (×3): 1 via ORAL
  Filled 2024-02-11 (×4): qty 1

## 2024-02-11 MED ORDER — INSULIN GLARGINE-YFGN 100 UNIT/ML ~~LOC~~ SOLN
10.0000 [IU] | Freq: Two times a day (BID) | SUBCUTANEOUS | Status: DC
  Administered 2024-02-11 – 2024-02-12 (×2): 10 [IU] via SUBCUTANEOUS
  Filled 2024-02-11 (×3): qty 0.1

## 2024-02-11 MED ORDER — ACETAMINOPHEN 325 MG PO TABS
650.0000 mg | ORAL_TABLET | Freq: Four times a day (QID) | ORAL | Status: DC | PRN
  Administered 2024-02-11: 650 mg via ORAL
  Filled 2024-02-11 (×2): qty 2

## 2024-02-11 MED ORDER — SODIUM CHLORIDE 0.9 % IV SOLN
500.0000 mg | INTRAVENOUS | Status: AC
  Administered 2024-02-11 – 2024-02-12 (×2): 500 mg via INTRAVENOUS
  Filled 2024-02-11 (×2): qty 5

## 2024-02-11 MED ORDER — LORATADINE 10 MG PO TABS
10.0000 mg | ORAL_TABLET | Freq: Every day | ORAL | Status: DC
  Administered 2024-02-11 – 2024-02-14 (×4): 10 mg via ORAL
  Filled 2024-02-11 (×4): qty 1

## 2024-02-11 MED ORDER — INSULIN ASPART 100 UNIT/ML IJ SOLN: 0.0000 [IU] | Freq: Every day | INTRAMUSCULAR | Status: DC

## 2024-02-11 MED ORDER — BUDESONIDE 0.25 MG/2ML IN SUSP
0.2500 mg | Freq: Two times a day (BID) | RESPIRATORY_TRACT | Status: DC
  Administered 2024-02-11 – 2024-02-14 (×7): 0.25 mg via RESPIRATORY_TRACT
  Filled 2024-02-11 (×6): qty 2

## 2024-02-11 MED ORDER — ENOXAPARIN SODIUM 60 MG/0.6ML IJ SOSY
60.0000 mg | PREFILLED_SYRINGE | INTRAMUSCULAR | Status: DC
  Administered 2024-02-12 – 2024-02-14 (×3): 60 mg via SUBCUTANEOUS
  Filled 2024-02-11 (×3): qty 0.6

## 2024-02-11 MED ORDER — PANTOPRAZOLE SODIUM 40 MG PO TBEC
40.0000 mg | DELAYED_RELEASE_TABLET | Freq: Every day | ORAL | Status: DC
  Administered 2024-02-11 – 2024-02-14 (×4): 40 mg via ORAL
  Filled 2024-02-11 (×2): qty 1
  Filled 2024-02-11: qty 2
  Filled 2024-02-11: qty 1

## 2024-02-11 MED ORDER — INSULIN ASPART 100 UNIT/ML IJ SOLN
4.0000 [IU] | Freq: Three times a day (TID) | INTRAMUSCULAR | Status: DC
  Administered 2024-02-11 (×2): 4 [IU] via SUBCUTANEOUS

## 2024-02-11 MED ORDER — CITALOPRAM HYDROBROMIDE 20 MG PO TABS
20.0000 mg | ORAL_TABLET | Freq: Every day | ORAL | Status: DC
  Administered 2024-02-11 – 2024-02-14 (×4): 20 mg via ORAL
  Filled 2024-02-11 (×3): qty 1
  Filled 2024-02-11: qty 2

## 2024-02-11 MED ORDER — LOSARTAN POTASSIUM 50 MG PO TABS
50.0000 mg | ORAL_TABLET | Freq: Every day | ORAL | Status: DC
  Administered 2024-02-11 – 2024-02-14 (×4): 50 mg via ORAL
  Filled 2024-02-11 (×4): qty 1

## 2024-02-11 MED ORDER — GUAIFENESIN-DM 100-10 MG/5ML PO SYRP
5.0000 mL | ORAL_SOLUTION | ORAL | Status: DC | PRN
  Administered 2024-02-12 – 2024-02-13 (×4): 5 mL via ORAL
  Filled 2024-02-11 (×4): qty 5

## 2024-02-11 MED ORDER — SODIUM CHLORIDE 0.9 % IV SOLN
2.0000 g | INTRAVENOUS | Status: DC
  Administered 2024-02-11 – 2024-02-13 (×3): 2 g via INTRAVENOUS
  Filled 2024-02-11 (×3): qty 20

## 2024-02-11 MED ORDER — INSULIN ASPART 100 UNIT/ML IJ SOLN
0.0000 [IU] | Freq: Three times a day (TID) | INTRAMUSCULAR | Status: DC
  Administered 2024-02-11 – 2024-02-12 (×2): 20 [IU] via SUBCUTANEOUS
  Administered 2024-02-12: 4 [IU] via SUBCUTANEOUS
  Administered 2024-02-12 – 2024-02-13 (×2): 15 [IU] via SUBCUTANEOUS
  Administered 2024-02-13: 4 [IU] via SUBCUTANEOUS
  Administered 2024-02-13: 7 [IU] via SUBCUTANEOUS

## 2024-02-11 MED ORDER — ACETAMINOPHEN 650 MG RE SUPP: 650.0000 mg | Freq: Four times a day (QID) | RECTAL | Status: DC | PRN

## 2024-02-11 MED ORDER — INSULIN GLARGINE-YFGN 100 UNIT/ML ~~LOC~~ SOLN
10.0000 [IU] | Freq: Every day | SUBCUTANEOUS | Status: DC
  Administered 2024-02-11: 10 [IU] via SUBCUTANEOUS
  Filled 2024-02-11: qty 0.1

## 2024-02-11 MED ORDER — INSULIN ASPART 100 UNIT/ML IJ SOLN: 0.0000 [IU] | Freq: Three times a day (TID) | INTRAMUSCULAR | Status: DC

## 2024-02-11 MED ORDER — TOPIRAMATE 25 MG PO TABS
25.0000 mg | ORAL_TABLET | Freq: Every day | ORAL | Status: DC
  Administered 2024-02-11 – 2024-02-13 (×3): 25 mg via ORAL
  Filled 2024-02-11 (×3): qty 1

## 2024-02-11 MED ORDER — OSELTAMIVIR PHOSPHATE 75 MG PO CAPS
75.0000 mg | ORAL_CAPSULE | Freq: Two times a day (BID) | ORAL | Status: DC
  Administered 2024-02-11 – 2024-02-14 (×7): 75 mg via ORAL
  Filled 2024-02-11 (×8): qty 1

## 2024-02-11 MED ORDER — IPRATROPIUM-ALBUTEROL 0.5-2.5 (3) MG/3ML IN SOLN
3.0000 mL | Freq: Four times a day (QID) | RESPIRATORY_TRACT | Status: AC
  Administered 2024-02-11 (×2): 3 mL via RESPIRATORY_TRACT
  Filled 2024-02-11 (×2): qty 3

## 2024-02-11 MED ORDER — ASPIRIN 81 MG PO TBEC
81.0000 mg | DELAYED_RELEASE_TABLET | Freq: Every day | ORAL | Status: DC
  Administered 2024-02-11 – 2024-02-14 (×4): 81 mg via ORAL
  Filled 2024-02-11 (×4): qty 1

## 2024-02-11 NOTE — Plan of Care (Signed)
  Problem: Education: Goal: Ability to describe self-care measures that may prevent or decrease complications (Diabetes Survival Skills Education) will improve Outcome: Progressing   Problem: Coping: Goal: Ability to adjust to condition or change in health will improve Outcome: Progressing

## 2024-02-11 NOTE — Progress Notes (Signed)
Patient admitted earlier this morning.  H&P reviewed.  Patient seen and examined.  Patient mentions that she is feeling slightly better today compared to yesterday.  Still has some difficulty breathing.  Denies any chest pain.  She was noted to have mild swelling of the left leg which she states is chronic.  Vital signs reviewed.  Saturations are 98%.  She is noted to be on nasal cannula oxygen at 3 L/min.  Lungs reveal few scattered wheezing bilaterally.  No definite crackles or rhonchi. S1-S2 is normal regular. Abdomen is soft.  Nontender nondistended. Mild swelling of the left leg is noted which the patient states is chronic.  She does not have any calf tenderness.   Elevated glucose levels noted this morning.  WBC is improved to 12.2 from 16.0.  Potassium improved to 4.2.  Continue with Tamiflu ceftriaxone and azithromycin for multifocal pneumonia noted on imaging studies.  Symptoms have been ongoing for several days so there is high probability of a secondary bacterial infection as well.  She also likely has concomitant acute asthma exacerbation for which she is noted to be on nebulizer treatments and steroids.  Typically does not use oxygen at home.  She was noted to have saturations of 88% on room air.  Currently in the late 90s on 3 L.  Should be able to wean her off as she is treated for her infection.  She does have a history of diabetes.  Unclear if she is on insulin.  Will place her on glargine.  Check HbA1c.  SSI.  Hypoglycemia is likely partly secondary to steroids.  Other issues as per H&P.  Osvaldo Shipper 02/11/2024

## 2024-02-11 NOTE — Plan of Care (Signed)

## 2024-02-11 NOTE — H&P (Signed)
History and Physical    Latoya Roth WJX:[id number] DOB: 11-Jun-1971 DOA: 02/10/2024  PCP: Norm Salt, PA  Patient coming from: Home  Chief Complaint: Shortness of breath  HPI: Latoya Roth is a 53 y.o. female with medical history significant of type 2 diabetes, HIV, hypertension, CVA, asthma, allergic rhinitis, anxiety/depression, arthritis, Bell's palsy, obesity presented to ED with complaints of shortness breath and cough.  Vital signs on arrival: Temperature 101.1 F, pulse 92, respiratory rate 22, blood pressure 158/73, and SpO2 94% on room air.  Labs showing WBC count 16.0, hemoglobin 11.0 (baseline 12-13 range), MCV 79.5, sodium 133, potassium 3.0, chloride 96, AST 51 and remainder of LFTs normal, influenza A PCR positive, lactic acid normal, blood cultures collected.  Chest x-ray showing multifocal pulmonary opacities concerning for pneumonia. Patient was given Tylenol, DuoNeb, Solu-Medrol 125 mg, Tamiflu, oral potassium 40 mEq, ceftriaxone, azithromycin, and IV mag 2 g.  TRH called to admit.  Patient is reporting 2-week history of progressively worsening dyspnea, cough, and wheezing.  She saw her PCP and was sent to the ED for further evaluation.  She is reporting generalized weakness/fatigue.  Denies fevers, chills, or body aches.  Her chest hurts only when she coughs but not otherwise.  She was having diarrhea which stopped 2 or 3 days ago.  Not having vomiting or abdominal pain.  No other complaints.  Review of Systems:  Review of Systems  All other systems reviewed and are negative.   Past Medical History:  Diagnosis Date   Anxiety    Arthritis    Asthma    Bell's palsy    BV (bacterial vaginosis)    Depression    Diabetes mellitus    HIV disease (HCC)    Hypertension    Obesity     Past Surgical History:  Procedure Laterality Date   CHOLECYSTECTOMY       reports that she has quit smoking. She has never used smokeless tobacco. She reports  current alcohol use of about 14.0 standard drinks of alcohol per week. She reports that she does not use drugs.  Allergies  Allergen Reactions   Penicillins Anaphylaxis   Shellfish Allergy Anaphylaxis, Hives and Swelling   Amoxicillin Rash    Family History  Problem Relation Age of Onset   Hypertension Mother    Cancer Son     Prior to Admission medications   Medication Sig Start Date End Date Taking? Authorizing Provider  acetaminophen (TYLENOL) 325 MG tablet Take 1-2 tablets (325-650 mg total) by mouth every 4 (four) hours as needed for mild pain. 04/24/23  Yes Setzer, Lynnell Jude, PA-C  aspirin EC 81 MG tablet Take 1 tablet (81 mg total) by mouth daily. Swallow whole. 04/25/23  Yes Setzer, Lynnell Jude, PA-C  bictegravir-emtricitabine-tenofovir AF (BIKTARVY) 50-200-25 MG TABS tablet Take 1 tablet by mouth daily. 09/17/23  Yes Comer, Belia Heman, MD  budesonide-formoterol [facility]) 160-4.5 MCG/ACT inhaler Inhale 2 puffs into the lungs 2 (two) times daily.   Yes [provider]  cetirizine (ZYRTEC) 10 MG tablet Take 10 mg by mouth daily.   Yes [provider]  citalopram (CELEXA) 20 MG tablet Take 1 tablet (20 mg total) by mouth daily. 04/24/23  Yes Setzer, Lynnell Jude, PA-C  clopidogrel (PLAVIX) 75 MG tablet Take 75 mg by mouth daily. 09/13/23  Yes [provider]  ferrous sulfate 325 (65 FE) MG tablet Take 325 mg by mouth daily with breakfast.   Yes [provider]  folic  acid (FOLVITE) 1 MG tablet Take 1 mg by mouth daily.   Yes [provider]  glipiZIDE (GLUCOTROL XL) 10 MG 24 hr tablet Take 10 mg by mouth daily. 09/13/23  Yes [provider]  losartan (COZAAR) 50 MG tablet Take 1 tablet (50 mg total) by mouth daily. 04/24/23  Yes Setzer, Lynnell Jude, PA-C  omeprazole (PRILOSEC) 20 MG capsule Take 20 mg by mouth daily.   Yes [provider]  topiramate (TOPAMAX) 25 MG tablet Take 1 tablet (25 mg total) by mouth at bedtime. 07/02/23  Yes Raulkar,  Drema Pry, MD  VENTOLIN HFA 108 (90 Base) MCG/ACT inhaler Inhale 1-2 puffs into the lungs every 6 (six) hours as needed for wheezing or shortness of breath. 01/16/24  Yes [provider]  Continuous Glucose Receiver (DEXCOM G7 RECEIVER) DEVI 1 each by Does not apply route daily. 05/03/23   Raulkar, Drema Pry, MD  Continuous Glucose Sensor (DEXCOM G7 SENSOR) MISC 1 each by Does not apply route daily. 05/03/23   Horton Chin, MD  Insulin Pen Needle 32G X 4 MM MISC Use as directed 04/24/23       Physical Exam: Vitals:   02/10/24 2345 02/11/24 0030 02/11/24 0045 02/11/24 0100  BP: (!) 129/59 118/62 (!) 131/58 (!) 125/48  Pulse: 77 84 77 78  Resp:      Temp:      TempSrc:      SpO2: 96% 91% 97% 97%    Physical Exam Vitals reviewed.  Constitutional:      General: She is not in acute distress. HENT:     Head: Normocephalic and atraumatic.  Cardiovascular:     Rate and Rhythm: Normal rate and regular rhythm.     Pulses: Normal pulses.  Pulmonary:     Effort: No respiratory distress.     Breath sounds: Wheezing present.     Comments: Mildly tachypneic Diffuse end expiratory wheezing Abdominal:     General: Bowel sounds are normal. There is no distension.     Palpations: Abdomen is soft.     Tenderness: There is no abdominal tenderness. There is no guarding.  Musculoskeletal:     Cervical back: Normal range of motion.     Right lower leg: Edema present.     Left lower leg: Edema present.     Comments: Left lower extremity appears larger in size which according to the patient is chronic.  Skin:    General: Skin is warm and dry.  Neurological:     General: No focal deficit present.     Mental Status: She is alert and oriented to person, place, and time.     Labs on Admission: I have personally reviewed following labs and imaging studies  CBC: Recent Labs  Lab 02/10/24 1927  WBC 16.0*  NEUTROABS 12.1*  HGB 11.0*  HCT 34.2*  MCV 79.5*  PLT 368   Basic  Metabolic Panel: Recent Labs  Lab 02/10/24 1927  NA 133*  K 3.0*  CL 96*  CO2 23  GLUCOSE 91  BUN 9  CREATININE 0.86  CALCIUM 8.0*   GFR: CrCl cannot be calculated (Unknown ideal weight.). Liver Function Tests: Recent Labs  Lab 02/10/24 1927  AST 51*  ALT 30  ALKPHOS 64  BILITOT 0.9  PROT 7.6  ALBUMIN 2.5*   No results for input(s): "LIPASE", "AMYLASE" in the last 168 hours. No results for input(s): "AMMONIA" in the last 168 hours. Coagulation Profile: No results for input(s): "INR", "PROTIME"  in the last 168 hours. Cardiac Enzymes: No results for input(s): "CKTOTAL", "CKMB", "CKMBINDEX", "TROPONINI" in the last 168 hours. BNP (last 3 results) No results for input(s): "PROBNP" in the last 8760 hours. HbA1C: No results for input(s): "HGBA1C" in the last 72 hours. CBG: No results for input(s): "GLUCAP" in the last 168 hours. Lipid Profile: No results for input(s): "CHOL", "HDL", "LDLCALC", "TRIG", "CHOLHDL", "LDLDIRECT" in the last 72 hours. Thyroid Function Tests: No results for input(s): "TSH", "T4TOTAL", "FREET4", "T3FREE", "THYROIDAB" in the last 72 hours. Anemia Panel: No results for input(s): "VITAMINB12", "FOLATE", "FERRITIN", "TIBC", "IRON", "RETICCTPCT" in the last 72 hours. Urine analysis:    Component Value Date/Time   COLORURINE YELLOW 04/11/2023 1543   APPEARANCEUR HAZY (A) 04/11/2023 1543   LABSPEC 1.014 04/11/2023 1543   PHURINE 5.0 04/11/2023 1543   GLUCOSEU NEGATIVE 04/11/2023 1543   HGBUR NEGATIVE 04/11/2023 1543   BILIRUBINUR NEGATIVE 04/11/2023 1543   BILIRUBINUR neg 01/15/2014 0933   KETONESUR NEGATIVE 04/11/2023 1543   PROTEINUR 30 (A) 04/11/2023 1543   UROBILINOGEN 1.0 03/13/2014 0902   NITRITE NEGATIVE 04/11/2023 1543   LEUKOCYTESUR NEGATIVE 04/11/2023 1543    Radiological Exams on Admission: DG Chest 2 View Result Date: 02/10/2024 CLINICAL DATA:  Cough.  Sick for 2 weeks. EXAM: CHEST - 2 VIEW COMPARISON:  04/30/2020 04/09/2023  FINDINGS: Lateral view is motion degraded and essentially nondiagnostic. Midline trachea. Borderline cardiomegaly. Atherosclerosis in the transverse aorta. No pleural effusion or pneumothorax. Left greater than right lower lobe airspace disease. Bilateral upper lobe more patchy, somewhat nodular opacities. These are all new since 04/09/2023. IMPRESSION: Multifocal pulmonary opacities, favoring pneumonia. Within the upper lobes, these are somewhat patchy and nodular in morphology. especially given this nodular morphology, recommend radiographic follow-up to confirm resolution. Aortic Atherosclerosis (ICD10-I70.0). Electronically Signed   By: Jeronimo Greaves M.D.   On: 02/10/2024 20:41    Assessment and Plan  Influenza A with multifocal pneumonia SIRS Presenting with fever and slight tachypnea.  Influenza A PCR positive.  Chest x-ray showing multifocal pulmonary opacities concerning for pneumonia.  WBC count 16.0.  No tachycardia or hypotension.  Lactate normal.  Continue Tamiflu, ceftriaxone, and azithromycin.  EKG ordered to check QT interval.  Trend WBC count and follow-up blood cultures.  Droplet and contact precautions.  Acute asthma exacerbation Still wheezing on exam.  Continue Solu-Medrol 60 mg every 12 hours, DuoNeb every 6 hours, Pulmicort neb twice daily, and albuterol neb every 4 hours PRN.  Acute hypoxemic respiratory failure Desatted to 88% on room air.  Currently satting in the upper 90s on 3 L [place].  Continue supplemental oxygen, wean as tolerated.  Mild hypokalemia Monitor potassium and magnesium levels, continue to replace as needed.  Mild microcytic anemia Hemoglobin currently 11.0 and baseline appears to be in the 12-13 range.  Patient is not endorsing any symptoms of GI bleed or any other bleeding.  Continue to monitor labs.  Mild hyponatremia Continue to monitor labs.  Type 2 diabetes Not on insulin.  Last A1c 8.0 in April 2024, repeat ordered.  Sensitive sliding scale insulin  ACHS.  Hold oral hypoglycemic agents at this time.  HIV CD4 count 681 in September 2024.  Continue Biktarvy.  Hypertension Blood pressure stable.  Continue losartan.  History of CVA Continue aspirin and Plavix  GERD Continue Protonix  DVT prophylaxis: Lovenox Code Status: Full Code (discussed with the patient) Level of care: Progressive Care Unit Admission status: It is my clinical opinion that admission to INPATIENT is reasonable and  necessary because of the expectation that this patient will require hospital care that crosses at least 2 midnights to treat this condition based on the medical complexity of the problems presented.  Given the aforementioned information, the predictability of an adverse outcome is felt to be significant.  John Giovanni MD [facility]  If 7PM-7AM, please contact night-coverage [email or web address]  02/11/2024, 1:58 AM

## 2024-02-12 ENCOUNTER — Institutional Professional Consult (permissible substitution): Payer: Medicaid Other | Admitting: Nurse Practitioner

## 2024-02-12 DIAGNOSIS — J4521 Mild intermittent asthma with (acute) exacerbation: Secondary | ICD-10-CM | POA: Diagnosis not present

## 2024-02-12 DIAGNOSIS — J09X1 Influenza due to identified novel influenza A virus with pneumonia: Secondary | ICD-10-CM | POA: Diagnosis not present

## 2024-02-12 LAB — BASIC METABOLIC PANEL
Anion gap: 14 (ref 5–15)
BUN: 15 mg/dL (ref 6–20)
CO2: 24 mmol/L (ref 22–32)
Calcium: 8.1 mg/dL — ABNORMAL LOW (ref 8.9–10.3)
Chloride: 96 mmol/L — ABNORMAL LOW (ref 98–111)
Creatinine, Ser: 0.71 mg/dL (ref 0.44–1.00)
GFR, Estimated: >60 mL/min (ref >60)
Glucose, Bld: 374 mg/dL — ABNORMAL HIGH (ref 70–99)
Potassium: 4.2 mmol/L (ref 3.5–5.1)
Sodium: 134 mmol/L — ABNORMAL LOW (ref 135–145)

## 2024-02-12 LAB — CBC
HCT: 33.3 % — ABNORMAL LOW (ref 36.0–46.0)
Hemoglobin: 10.7 g/dL — ABNORMAL LOW (ref 12.0–15.0)
MCH: 25.7 pg — ABNORMAL LOW (ref 26.0–34.0)
MCHC: 32.1 g/dL (ref 30.0–36.0)
MCV: 79.9 fL — ABNORMAL LOW (ref 80.0–100.0)
Platelets: 381 10*3/uL (ref 150–400)
RBC: 4.17 MIL/uL (ref 3.87–5.11)
RDW: 14.6 % (ref 11.5–15.5)
WBC: 14.1 10*3/uL — ABNORMAL HIGH (ref 4.0–10.5)
nRBC: 0.1 % (ref 0.0–0.2)

## 2024-02-12 LAB — RESPIRATORY PANEL BY PCR

## 2024-02-12 LAB — GLUCOSE, CAPILLARY
Glucose-Capillary: 349 mg/dL — ABNORMAL HIGH (ref 70–99)
Glucose-Capillary: 366 mg/dL — ABNORMAL HIGH (ref 70–99)
Glucose-Capillary: 188 mg/dL — ABNORMAL HIGH (ref 70–99)
Glucose-Capillary: 289 mg/dL — ABNORMAL HIGH (ref 70–99)

## 2024-02-12 MED ORDER — INSULIN GLARGINE-YFGN 100 UNIT/ML ~~LOC~~ SOLN
15.0000 [IU] | Freq: Two times a day (BID) | SUBCUTANEOUS | Status: DC
  Administered 2024-02-12 – 2024-02-13 (×3): 15 [IU] via SUBCUTANEOUS
  Filled 2024-02-12 (×4): qty 0.15

## 2024-02-12 MED ORDER — INSULIN ASPART 100 UNIT/ML IJ SOLN
6.0000 [IU] | Freq: Three times a day (TID) | INTRAMUSCULAR | Status: DC
Start: 2024-02-12 — End: 2024-02-14

## 2024-02-12 NOTE — Progress Notes (Signed)
PROGRESS NOTE    Latoya Roth  ZOX:[id number] DOB: 1971-07-25 DOA: 02/10/2024 PCP: Norm Salt, PA    Chief Complaint  Patient presents with   Shortness of Breath    Pneumonia    Brief Narrative:   Latoya Roth is a 53 y.o. female with medical history significant of type 2 diabetes, HIV, hypertension, CVA, asthma, allergic rhinitis, anxiety/depression, arthritis, Bell's palsy, obesity presented to ED with complaints of shortness breath and cough.  Vital signs on arrival: Temperature 101.1 F, pulse 92, respiratory rate 22, blood pressure 158/73, and SpO2 94% on room air.  Labs showing WBC count 16.0, hemoglobin 11.0 (baseline 12-13 range), MCV 79.5, sodium 133, potassium 3.0, chloride 96, AST 51 and remainder of LFTs normal, influenza A PCR positive, lactic acid normal, blood cultures collected.  Chest x-ray showing multifocal pulmonary opacities concerning for pneumonia. Patient was given Tylenol, DuoNeb, Solu-Medrol 125 mg, Tamiflu, oral potassium 40 mEq, ceftriaxone, azithromycin, and IV mag 2 g.      Assessment & Plan:   Principal Problem:   Influenza A with pneumonia Active Problems:   Essential hypertension   Acute asthma exacerbation   HIV (human immunodeficiency virus infection) (HCC)   SIRS (systemic inflammatory response syndrome) (HCC)   Acute hypoxemic respiratory failure (HCC)   Hypokalemia   Influenza A with multifocal pneumonia Acute asthma exacerbation Acute respiratory failure with hypoxia -Presenting with fever, leukocytosis and slight tachypnea.  Influenza A PCR positive.  Chest x-ray showing multifocal pulmonary opacities concerning for pneumonia.  Asthma exacerbation most likely provoked by influenza A infection -Continue with Tamiflu -Continue with IV Solu-Medrol given significant wheezing. -Continue with scheduled DuoNebs, as needed albuterol, continue with Pulmicort nebs twice daily as well -She was encouraged to use incentive  spirometry and flutter valve -Remains on 2 to 3 L nasal cannula, desaturated to 88% on room air.   Mild hypokalemia Replaced   Mild microcytic anemia Hemoglobin currently 11.0 and baseline appears to be in the 12-13 range.     Mild hyponatremia Symptomatic, continue to monitor   Type 2 diabetes uncontrolled, with hyperglycemia Not on insulin.  Last A1c 8.0 in April 2024, repeat is 7.7 . -CBG significantly elevated due to steroids, continue with insulin sliding scale, will increase premeal NovoLog to 6 units, will increase Semglee from 10 to 15 units twice daily.  HIV CD4 count 681 in September 2024.  Continue Biktarvy.   Hypertension Blood pressure stable.  Continue losartan.   History of CVA Continue aspirin and Plavix   GERD Continue Protonix     DVT prophylaxis: Lovenox Code Status: Full code Family Communication: None at bedside Disposition:   Status is: Inpatient    Consultants:  None   Subjective: Still reports dyspnea, cough, generalized weakness and fatigue  Objective: Vitals:   02/12/24 0355 02/12/24 0800 02/12/24 0811 02/12/24 1159  BP:  (!) 129/59  133/76  Pulse: 69  91 64  Resp:  20 18   Temp:  98.5 F (36.9 C)  (!) 97.4 F (36.3 C)  TempSrc:  Oral  Oral  SpO2: 99% 98%  100%  Weight:        Intake/Output Summary (Last 24 hours) at 02/12/2024 1524 Last data filed at 02/12/2024 0300 Gross per 24 hour  Intake 1210 ml  Output --  Net 1210 ml   Filed Weights   02/11/24 0245  Weight: 123.8 kg    Examination:  Awake Alert, Oriented X 3, No new F.N deficits, Normal affect Symmetrical  Chest wall movement, remains with significant wheezing, diminished air entry, mildly tachypneic RRR,No Gallops,Rubs or new Murmurs, No Parasternal Heave +ve B.Sounds, Abd Soft, No tenderness, No rebound - guarding or rigidity. No Cyanosis, Clubbing or edema, No new Rash or bruise     Data Reviewed: I have personally reviewed following labs and imaging  studies  CBC: Recent Labs  Lab 02/10/24 1927 02/11/24 0720 02/12/24 0442  WBC 16.0* 12.2* 14.1*  NEUTROABS 12.1*  --   --   HGB 11.0* 11.0* 10.7*  HCT 34.2* 35.0* 33.3*  MCV 79.5* 81.6 79.9*  PLT 368 323 381    Basic Metabolic Panel: Recent Labs  Lab 02/10/24 1927 02/11/24 0720 02/11/24 1723 02/12/24 0442  NA 133* 134*  --  134*  K 3.0* 4.2  --  4.2  CL 96* 97*  --  96*  CO2 23 20*  --  24  GLUCOSE 91 430* 469* 374*  BUN 9 14  --  15  CREATININE 0.86 0.96  --  0.71  CALCIUM 8.0* 8.1*  --  8.1*  MG  --  2.1  --   --     GFR: Estimated Creatinine Clearance: 109.8 mL/min (by C-G formula based on SCr of 0.71 mg/dL).  Liver Function Tests: Recent Labs  Lab 02/10/24 1927 02/11/24 0720  AST 51* 37  ALT 30 30  ALKPHOS 64 67  BILITOT 0.9 1.1  PROT 7.6 7.5  ALBUMIN 2.5* 2.3*    CBG: Recent Labs  Lab 02/11/24 1156 02/11/24 1659 02/11/24 2137 02/12/24 0835 02/12/24 1154  GLUCAP 394* 438* 312* 349* 366*     Recent Results (from the past 240 hours)  Resp panel by RT-PCR (RSV, Flu A&B, Covid) Anterior Nasal Swab     Status: Abnormal   Collection Time: 02/10/24  7:25 PM   Specimen: Anterior Nasal Swab  Result Value Ref Range Status   SARS Coronavirus 2 by RT PCR NEGATIVE NEGATIVE Final   Influenza A by PCR POSITIVE (A) NEGATIVE Final   Influenza B by PCR NEGATIVE NEGATIVE Final    Comment: (NOTE) The Xpert Xpress SARS-CoV-2/FLU/RSV plus assay is intended as an aid in the diagnosis of influenza from Nasopharyngeal swab specimens and should not be used as a sole basis for treatment. Nasal washings and aspirates are unacceptable for Xpert Xpress SARS-CoV-2/FLU/RSV testing.  Fact Sheet for Patients: BloggerCourse.com  Fact Sheet for Healthcare Providers: SeriousBroker.it  This test is not yet approved or cleared by the [place] FDA and has been authorized for detection and/or diagnosis of SARS-CoV-2  by FDA under an Emergency Use Authorization (EUA). This EUA will remain in effect (meaning this test can be used) for the duration of the COVID-19 declaration under Section 564(b)(1) of the Act, 21 U.S.C. section 360bbb-3(b)(1), unless the authorization is terminated or revoked.     Resp Syncytial Virus by PCR NEGATIVE NEGATIVE Final    Comment: (NOTE) Fact Sheet for Patients: BloggerCourse.com  Fact Sheet for Healthcare Providers: SeriousBroker.it  This test is not yet approved or cleared by the [place] FDA and has been authorized for detection and/or diagnosis of SARS-CoV-2 by FDA under an Emergency Use Authorization (EUA). This EUA will remain in effect (meaning this test can be used) for the duration of the COVID-19 declaration under Section 564(b)(1) of the Act, 21 U.S.C. section 360bbb-3(b)(1), unless the authorization is terminated or revoked.  Performed at [facility], [address] [place], [place] [address]   Blood culture (routine x  2)     Status: None (Preliminary result)   Collection Time: 02/10/24 10:32 PM   Specimen: BLOOD  Result Value Ref Range Status   Specimen Description BLOOD LEFT ANTECUBITAL  Final   Special Requests   Final    BOTTLES DRAWN AEROBIC AND ANAEROBIC Blood Culture results may not be optimal due to an inadequate volume of blood received in culture bottles   Culture   Final    NO GROWTH 2 DAYS Performed at [facility], [address] [place], [place] [address]    Report Status PENDING  Incomplete  Blood culture (routine x 2)     Status: None (Preliminary result)   Collection Time: 02/10/24 10:32 PM   Specimen: BLOOD RIGHT HAND  Result Value Ref Range Status   Specimen Description BLOOD RIGHT HAND  Final   Special Requests   Final    BOTTLES DRAWN AEROBIC AND ANAEROBIC Blood Culture results may not be optimal due to an inadequate volume of blood received in culture  bottles   Culture   Final    NO GROWTH 2 DAYS Performed at [facility], [address] [place], [place] [address]    Report Status PENDING  Incomplete         Radiology Studies: DG Chest 2 View Result Date: 02/10/2024 CLINICAL DATA:  Cough.  Sick for 2 weeks. EXAM: CHEST - 2 VIEW COMPARISON:  04/30/2020 04/09/2023 FINDINGS: Lateral view is motion degraded and essentially nondiagnostic. Midline trachea. Borderline cardiomegaly. Atherosclerosis in the transverse aorta. No pleural effusion or pneumothorax. Left greater than right lower lobe airspace disease. Bilateral upper lobe more patchy, somewhat nodular opacities. These are all new since 04/09/2023. IMPRESSION: Multifocal pulmonary opacities, favoring pneumonia. Within the upper lobes, these are somewhat patchy and nodular in morphology. especially given this nodular morphology, recommend radiographic follow-up to confirm resolution. Aortic Atherosclerosis (ICD10-I70.0). Electronically Signed   By: Jeronimo Greaves M.D.   On: 02/10/2024 20:41        Scheduled Meds:  aspirin EC  81 mg Oral Daily   bictegravir-emtricitabine-tenofovir AF  1 tablet Oral Daily   budesonide (PULMICORT) nebulizer solution  0.25 mg Nebulization BID   citalopram  20 mg Oral Daily   clopidogrel  75 mg Oral Daily   enoxaparin (LOVENOX) injection  60 mg Subcutaneous Q24H   insulin aspart  0-20 Units Subcutaneous TID WC   insulin aspart  0-5 Units Subcutaneous QHS   insulin aspart  4 Units Subcutaneous TID WC   insulin glargine-yfgn  10 Units Subcutaneous BID   loratadine  10 mg Oral Daily   losartan  50 mg Oral Daily   methylPREDNISolone (SOLU-MEDROL) injection  60 mg Intravenous Q12H   oseltamivir  75 mg Oral BID   pantoprazole  40 mg Oral Daily   topiramate  25 mg Oral QHS   Continuous Infusions:  azithromycin (ZITHROMAX) 500 mg in sodium chloride 0.9 % 250 mL IVPB 500 mg (02/11/24 2124)   cefTRIAXone (ROCEPHIN)  IV 2 g (02/11/24 2005)      LOS: 2 days       Huey Bienenstock, MD [facility]   To contact the attending provider between 7A-7P or the covering provider during after hours 7P-7A, please log into the web site [email or web address] and access using universal [place] password for that web site. If you do not have the password, please call the hospital operator.  02/12/2024, 3:24 PM

## 2024-02-12 NOTE — Inpatient Diabetes Management (Signed)
Inpatient Diabetes Program Recommendations  AACE/ADA: New Consensus Statement on Inpatient Glycemic Control (2015)  Target Ranges:  Prepandial:   less than 140 mg/dL      Peak postprandial:   less than 180 mg/dL (1-2 hours)      Critically ill patients:  140 - 180 mg/dL   Lab Results  Component Value Date   GLUCAP 349 (H) 02/12/2024   HGBA1C 7.7 (H) 02/11/2024    Latest Reference Range & Units 02/11/24 08:00 02/11/24 11:56 02/11/24 16:59 02/11/24 21:37 02/12/24 08:35  Glucose-Capillary 70 - 99 mg/dL 098 (H) 119 (H) 147 (H) 312 (H) 349 (H)  (H): Data is abnormally high  Diabetes history: DM2 Outpatient Diabetes medications: Glipizide 10 mg Daily, Dexcom G7  Current orders for Inpatient glycemic control: Semglee 10 units Daily, Novolog 0-20 units tid + hs + Novolog 4 units tid meal coverage, Solumedrol 60 mg Q12   Inpatient Diabetes Program Recommendations:   While on steroids, please consider: -Change Novolog correction to q 4 hrs.  Thank you, Billy Fischer. Kalysta Kneisley, RN, MSN, CDCES  Diabetes Coordinator Inpatient Glycemic Control Team Team Pager [phone] (8am-5pm) 02/12/2024 10:22 AM

## 2024-02-12 NOTE — Plan of Care (Signed)

## 2024-02-12 NOTE — Progress Notes (Signed)
Pt has flu and RSV. Ok to add stop date of 3d for azith and 5d for ceftriaxone per Dr Randol Kern.  Ulyses Southward, PharmD, BCIDP, AAHIVP, CPP Infectious Disease Pharmacist 02/12/2024 6:28 PM

## 2024-02-12 NOTE — TOC CM/SW Note (Signed)
Transition of Care [facility]) - Inpatient Brief Assessment   Patient Details  Name: Latoya Roth MRN: [id number] Date of Birth: 1971/11/19  [facility] of [facility]) CM/SW Contact:    Mearl Latin, LCSW Phone Number: 02/12/2024, 4:13 PM   Clinical Narrative: Patient admitted from home with Flu. SDOH food resources flagged and provided. Please place [facility] consult if additional resources arise.     Transition of Care Asessment: Insurance and Status: Insurance coverage has been reviewed Patient has primary care physician: Yes Home environment has been reviewed: From home Prior level of function:: Independent Prior/Current Home Services: No current home services Social Drivers of Health Review: SDOH reviewed interventions complete Readmission risk has been reviewed: Yes Transition of care needs: no transition of care needs at this time

## 2024-02-12 NOTE — Discharge Instructions (Signed)

## 2024-02-13 DIAGNOSIS — J09X1 Influenza due to identified novel influenza A virus with pneumonia: Secondary | ICD-10-CM | POA: Diagnosis not present

## 2024-02-13 LAB — GLUCOSE, CAPILLARY
Glucose-Capillary: 347 mg/dL — ABNORMAL HIGH (ref 70–99)
Glucose-Capillary: 240 mg/dL — ABNORMAL HIGH (ref 70–99)
Glucose-Capillary: 257 mg/dL — ABNORMAL HIGH (ref 70–99)
Glucose-Capillary: 176 mg/dL — ABNORMAL HIGH (ref 70–99)
Glucose-Capillary: 108 mg/dL — ABNORMAL HIGH (ref 70–99)

## 2024-02-13 MED ORDER — GLIPIZIDE ER 10 MG PO TB24
10.0000 mg | ORAL_TABLET | Freq: Every day | ORAL | Status: DC
  Administered 2024-02-13: 10 mg via ORAL
  Filled 2024-02-13 (×3): qty 1

## 2024-02-13 MED ORDER — PREDNISONE 20 MG PO TABS
40.0000 mg | ORAL_TABLET | Freq: Every day | ORAL | Status: DC
Start: 2024-02-14 — End: 2024-02-14
  Administered 2024-02-14: 40 mg via ORAL
  Filled 2024-02-13: qty 2

## 2024-02-13 NOTE — Plan of Care (Signed)
  Problem: Nutritional: Goal: Maintenance of adequate nutrition will improve Outcome: Progressing   Problem: Nutritional: Goal: Progress toward achieving an optimal weight will improve Outcome: Progressing   Problem: Skin Integrity: Goal: Risk for impaired skin integrity will decrease Outcome: Progressing   Problem: Tissue Perfusion: Goal: Adequacy of tissue perfusion will improve Outcome: Progressing   Problem: Education: Goal: Knowledge of General Education information will improve Description: Including pain rating scale, medication(s)/side effects and non-pharmacologic comfort measures Outcome: Progressing   Problem: Activity: Goal: Risk for activity intolerance will decrease Outcome: Progressing   Problem: Nutrition: Goal: Adequate nutrition will be maintained Outcome: Progressing

## 2024-02-13 NOTE — Plan of Care (Signed)

## 2024-02-13 NOTE — Progress Notes (Signed)
PROGRESS NOTE    Latoya Roth  NFA:[id number] DOB: Jun 22, 1971 DOA: 02/10/2024 PCP: Norm Salt, PA    Chief Complaint  Patient presents with   Shortness of Breath    Pneumonia    Brief Narrative:   Latoya Roth is a 53 y.o. female with medical history significant of type 2 diabetes, HIV, hypertension, CVA, asthma, allergic rhinitis, anxiety/depression, arthritis, Bell's palsy, obesity presented to ED with complaints of shortness breath and cough.  Vital signs on arrival: Temperature 101.1 F, pulse 92, respiratory rate 22, blood pressure 158/73, and SpO2 94% on room air.  Labs showing WBC count 16.0, hemoglobin 11.0 (baseline 12-13 range), MCV 79.5, sodium 133, potassium 3.0, chloride 96, AST 51 and remainder of LFTs normal, influenza A PCR positive, lactic acid normal, blood cultures collected.  Chest x-ray showing multifocal pulmonary opacities concerning for pneumonia. Patient was given Tylenol, DuoNeb, Solu-Medrol 125 mg, Tamiflu, oral potassium 40 mEq, ceftriaxone, azithromycin, and IV mag 2 g.      Assessment & Plan:   Principal Problem:   Influenza A with pneumonia Active Problems:   Essential hypertension   Acute asthma exacerbation   HIV (human immunodeficiency virus infection) (HCC)   SIRS (systemic inflammatory response syndrome) (HCC)   Acute hypoxemic respiratory failure (HCC)   Hypokalemia   Influenza A with multifocal pneumonia Acute asthma exacerbation Acute respiratory failure with hypoxia -Presenting with fever, leukocytosis and slight tachypnea.  Influenza A PCR positive.  Chest x-ray showing multifocal pulmonary opacities concerning for pneumonia.  So we will continue with antibiotic including azithromycin and Rocephin, asthma exacerbation most likely provoked by influenza A infection -Continue with Tamiflu -Wheezing has improved, she is on room air today, will change to prednisone -Continue with scheduled DuoNebs, as needed albuterol,  continue with Pulmicort nebs twice daily as well -She was encouraged to use incentive spirometry and flutter valve -No hypoxia this morning.no oxygen requirement.  Mild hypokalemia Replaced   Mild microcytic anemia Hemoglobin currently 11.0 and baseline appears to be in the 12-13 range.     Mild hyponatremia Symptomatic, continue to monitor   Type 2 diabetes uncontrolled, with hyperglycemia Not on insulin.  Last A1c 8.0 in April 2024, repeat is 7.7 . -CBG significantly elevated due to steroids, continue with insulin sliding scale, will increase premeal NovoLog to 6 units, as well increased Semglee to 15 units twice daily, remains elevated so did resume home Glucotrol, and tapering steroids to hopefully this will improve CBGs.    HIV CD4 count 681 in September 2024.  Continue Biktarvy.   Hypertension Blood pressure stable.  Continue losartan.   History of CVA Continue aspirin and Plavix   GERD Continue Protonix     DVT prophylaxis: Lovenox Code Status: Full code Family Communication: None at bedside Disposition:   Status is: Inpatient    Consultants:  None   Subjective: Reports dyspnea has improved, still complaining of cough.  Objective: Vitals:   02/13/24 0900 02/13/24 0950 02/13/24 1218 02/13/24 1251  BP:   (!) 168/72 124/75  Pulse:   65 67  Resp:    20  Temp:   97.8 F (36.6 C) 97.8 F (36.6 C)  TempSrc:   Oral Oral  SpO2: 92% 96% 96%   Weight:        Intake/Output Summary (Last 24 hours) at 02/13/2024 1517 Last data filed at 02/13/2024 0003 Gross per 24 hour  Intake 240 ml  Output --  Net 240 ml   [employer]  02/11/24 0245  Weight: 123.8 kg    Examination:  Awake Alert, Oriented X 3, No new F.N deficits, Normal affect Symmetrical Chest wall movement, improved diminished air entry and wheezing RRR,No Gallops,Rubs or new Murmurs, No Parasternal Heave +ve B.Sounds, Abd Soft, No tenderness, No rebound - guarding or rigidity. No Cyanosis,  Clubbing or edema, No new Rash or bruise     Data Reviewed: I have personally reviewed following labs and imaging studies  CBC: Recent Labs  Lab 02/10/24 1927 02/11/24 0720 02/12/24 0442  WBC 16.0* 12.2* 14.1*  NEUTROABS 12.1*  --   --   HGB 11.0* 11.0* 10.7*  HCT 34.2* 35.0* 33.3*  MCV 79.5* 81.6 79.9*  PLT 368 323 381    Basic Metabolic Panel: Recent Labs  Lab 02/10/24 1927 02/11/24 0720 02/11/24 1723 02/12/24 0442  NA 133* 134*  --  134*  K 3.0* 4.2  --  4.2  CL 96* 97*  --  96*  CO2 23 20*  --  24  GLUCOSE 91 430* 469* 374*  BUN 9 14  --  15  CREATININE 0.86 0.96  --  0.71  CALCIUM 8.0* 8.1*  --  8.1*  MG  --  2.1  --   --     GFR: Estimated Creatinine Clearance: 109.8 mL/min (by C-G formula based on SCr of 0.71 mg/dL).  Liver Function Tests: Recent Labs  Lab 02/10/24 1927 02/11/24 0720  AST 51* 37  ALT 30 30  ALKPHOS 64 67  BILITOT 0.9 1.1  PROT 7.6 7.5  ALBUMIN 2.5* 2.3*    CBG: Recent Labs  Lab 02/12/24 1534 02/12/24 2139 02/13/24 0832 02/13/24 1233 02/13/24 1250  GLUCAP 188* 289* 347* 240* 257*     Recent Results (from the past 240 hours)  Resp panel by RT-PCR (RSV, Flu A&B, Covid) Anterior Nasal Swab     Status: Abnormal   Collection Time: 02/10/24  7:25 PM   Specimen: Anterior Nasal Swab  Result Value Ref Range Status   SARS Coronavirus 2 by RT PCR NEGATIVE NEGATIVE Final   Influenza A by PCR POSITIVE (A) NEGATIVE Final   Influenza B by PCR NEGATIVE NEGATIVE Final    Comment: (NOTE) The Xpert Xpress SARS-CoV-2/FLU/RSV plus assay is intended as an aid in the diagnosis of influenza from Nasopharyngeal swab specimens and should not be used as a sole basis for treatment. Nasal washings and aspirates are unacceptable for Xpert Xpress SARS-CoV-2/FLU/RSV testing.  Fact Sheet for Patients: BloggerCourse.com  Fact Sheet for Healthcare Providers: SeriousBroker.it  This test is not  yet approved or cleared by the [place] FDA and has been authorized for detection and/or diagnosis of SARS-CoV-2 by FDA under an Emergency Use Authorization (EUA). This EUA will remain in effect (meaning this test can be used) for the duration of the COVID-19 declaration under Section 564(b)(1) of the Act, 21 U.S.C. section 360bbb-3(b)(1), unless the authorization is terminated or revoked.     Resp Syncytial Virus by PCR NEGATIVE NEGATIVE Final    Comment: (NOTE) Fact Sheet for Patients: BloggerCourse.com  Fact Sheet for Healthcare Providers: SeriousBroker.it  This test is not yet approved or cleared by the [place] FDA and has been authorized for detection and/or diagnosis of SARS-CoV-2 by FDA under an Emergency Use Authorization (EUA). This EUA will remain in effect (meaning this test can be used) for the duration of the COVID-19 declaration under Section 564(b)(1) of the Act, 21 U.S.C. section 360bbb-3(b)(1), unless the authorization is terminated or revoked.  Performed at [facility], [address], [place], [place] [address]   Blood culture (routine x 2)     Status: None (Preliminary result)   Collection Time: 02/10/24 10:32 PM   Specimen: BLOOD  Result Value Ref Range Status   Specimen Description BLOOD LEFT ANTECUBITAL  Final   Special Requests   Final    BOTTLES DRAWN AEROBIC AND ANAEROBIC Blood Culture results may not be optimal due to an inadequate volume of blood received in culture bottles   Culture   Final    NO GROWTH 3 DAYS Performed at [facility], [address], [place], [place] [address]    Report Status PENDING  Incomplete  Blood culture (routine x 2)     Status: None (Preliminary result)   Collection Time: 02/10/24 10:32 PM   Specimen: BLOOD RIGHT HAND  Result Value Ref Range Status   Specimen Description BLOOD RIGHT HAND  Final   Special Requests   Final    BOTTLES DRAWN  AEROBIC AND ANAEROBIC Blood Culture results may not be optimal due to an inadequate volume of blood received in culture bottles   Culture   Final    NO GROWTH 3 DAYS Performed at [facility], [address] [place], [place] [address]    Report Status PENDING  Incomplete  Respiratory (~20 pathogens) panel by PCR     Status: Abnormal   Collection Time: 02/12/24  2:53 PM   Specimen: Nasopharyngeal Swab; Respiratory  Result Value Ref Range Status   Adenovirus NOT DETECTED NOT DETECTED Final   Coronavirus 229E NOT DETECTED NOT DETECTED Final    Comment: (NOTE) The Coronavirus on the Respiratory Panel, DOES NOT test for the novel  Coronavirus (2019 nCoV)    Coronavirus HKU1 NOT DETECTED NOT DETECTED Final   Coronavirus NL63 NOT DETECTED NOT DETECTED Final   Coronavirus OC43 NOT DETECTED NOT DETECTED Final   Metapneumovirus NOT DETECTED NOT DETECTED Final   Rhinovirus / Enterovirus NOT DETECTED NOT DETECTED Final   Influenza A NOT DETECTED NOT DETECTED Final   Influenza B NOT DETECTED NOT DETECTED Final   Parainfluenza Virus 1 NOT DETECTED NOT DETECTED Final   Parainfluenza Virus 2 NOT DETECTED NOT DETECTED Final   Parainfluenza Virus 3 NOT DETECTED NOT DETECTED Final   Parainfluenza Virus 4 NOT DETECTED NOT DETECTED Final   Respiratory Syncytial Virus DETECTED (A) NOT DETECTED Final   Bordetella pertussis NOT DETECTED NOT DETECTED Final   Bordetella Parapertussis NOT DETECTED NOT DETECTED Final   Chlamydophila pneumoniae NOT DETECTED NOT DETECTED Final   Mycoplasma pneumoniae NOT DETECTED NOT DETECTED Final    Comment: Performed at [facility], [address], [place], [place] [address]         Radiology Studies: No results found.       Scheduled Meds:  aspirin EC  81 mg Oral Daily   bictegravir-emtricitabine-tenofovir AF  1 tablet Oral Daily   budesonide (PULMICORT) nebulizer solution  0.25 mg Nebulization BID   citalopram  20 mg Oral Daily   clopidogrel   75 mg Oral Daily   enoxaparin (LOVENOX) injection  60 mg Subcutaneous Q24H   glipiZIDE  10 mg Oral Q breakfast   insulin aspart  0-20 Units Subcutaneous TID WC   insulin aspart  0-5 Units Subcutaneous QHS   insulin aspart  6 Units Subcutaneous TID WC   insulin glargine-yfgn  15 Units Subcutaneous BID   loratadine  10 mg Oral Daily  losartan  50 mg Oral Daily   oseltamivir  75 mg Oral BID   pantoprazole  40 mg Oral Daily   [START ON 02/14/2024] predniSONE  40 mg Oral Q breakfast   topiramate  25 mg Oral QHS   Continuous Infusions:  cefTRIAXone (ROCEPHIN)  IV 2 g (02/12/24 2218)     LOS: 3 days       Latoya Bienenstock, MD [facility]   To contact the attending provider between 7A-7P or the covering provider during after hours 7P-7A, please log into the web site [email or web address] and access using universal  password for that web site. If you do not have the password, please call the hospital operator.  02/13/2024, 3:17 PM

## 2024-02-13 NOTE — Evaluation (Signed)
Physical Therapy Brief Evaluation and Discharge Note Patient Details Name: Latoya Roth MRN: [id number] DOB: 10/31/1971 Today's Date: 02/13/2024   History of Present Illness  Latoya Roth is a 53 y.o. female presented to ED with complaints of shortness breath and cough. Past medical history significant of type 2 diabetes, HIV, hypertension, CVA, asthma, allergic rhinitis, anxiety/depression, arthritis, Bell's palsy, obesity.  Clinical Impression  Pt presents with admitting diagnosis above. Pt today was able to ambulate in hallway with rollator Mod I. PTA pt reports she is Mod I with SPC or Rollator. Pt presents at or near baseline mobility. Pt has no further acute PT needs and will be signing off. Re consult PT if mobility status changes. Pt would benefit from continued mobility with mobility specialist during acute stay.        PT Assessment Patient does not need any further PT services  Assistance Needed at Discharge  PRN    Equipment Recommendations None recommended by PT  Recommendations for Other Services       Precautions/Restrictions Precautions Precautions: Fall Restrictions Weight Bearing Restrictions Per Provider Order: No        Mobility  Bed Mobility       General bed mobility comments: Pt up in chair  Transfers Overall transfer level: Independent Equipment used: None                    Ambulation/Gait Ambulation/Gait assistance: Modified independent (Device/Increase time) Gait Distance (Feet): 400 Feet Assistive device: Rollator (4 wheels) Gait Pattern/deviations: WFL(Within Functional Limits) Gait Speed: Pace WFL General Gait Details: no LOB noted.  Home Activity Instructions    Stairs            Modified Rankin (Stroke Patients Only)        Balance Overall balance assessment: Independent                        Pertinent Vitals/Pain   Pain Assessment Pain Assessment: No/denies pain     Home Living  Family/patient expects to be discharged to:: Private residence Living Arrangements: Alone Available Help at Discharge: Family;Available PRN/intermittently Home Environment: Level entry   Home Equipment: Cane - single point;Rollator (4 wheels)        Prior Function Level of Independence: Independent with assistive device(s) Comments: Mod I rollator    UE/LE Assessment   UE ROM/Strength/Tone/Coordination: WFL    LE ROM/Strength/Tone/Coordination: [facility]      Communication   Communication Communication: No apparent difficulties     Cognition Overall Cognitive Status: Appears within functional limits for tasks assessed/performed       General Comments General comments (skin integrity, edema, etc.): VSS on RA    Exercises     Assessment/Plan    PT Problem List         PT Visit Diagnosis Other abnormalities of gait and mobility (R26.89)    No Skilled PT Patient at baseline level of functioning;Patient is modified independent with all activity/mobility   Co-evaluation                AMPAC 6 Clicks Help needed turning from your back to your side while in a flat bed without using bedrails?: None Help needed moving from lying on your back to sitting on the side of a flat bed without using bedrails?: None Help needed moving to and from a bed to a chair (including a wheelchair)?: None Help needed standing up from a chair using your arms (  e.g., wheelchair or bedside chair)?: None Help needed to walk in hospital room?: None Help needed climbing 3-5 steps with a railing? : None 6 Click Score: 24      End of Session Equipment Utilized During Treatment: Gait belt Activity Tolerance: Patient tolerated treatment well Patient left: in chair;with call bell/phone within reach Nurse Communication: Mobility status PT Visit Diagnosis: Other abnormalities of gait and mobility (R26.89)     Time: 1610-9604 PT Time Calculation (min) (ACUTE ONLY): 17 min  Charges:   PT  Evaluation $PT Eval Low Complexity: 1 Low      Adyen Bifulco B, PT, DPT Acute Rehab Services [phone]   Gladys Damme  02/13/2024, 11:15 AM

## 2024-02-14 ENCOUNTER — Other Ambulatory Visit (HOSPITAL_COMMUNITY): Payer: Self-pay

## 2024-02-14 DIAGNOSIS — J09X1 Influenza due to identified novel influenza A virus with pneumonia: Secondary | ICD-10-CM | POA: Diagnosis not present

## 2024-02-14 DIAGNOSIS — J4521 Mild intermittent asthma with (acute) exacerbation: Secondary | ICD-10-CM | POA: Diagnosis not present

## 2024-02-14 LAB — GLUCOSE, CAPILLARY: Glucose-Capillary: 102 mg/dL — ABNORMAL HIGH (ref 70–99)

## 2024-02-14 MED ORDER — OSELTAMIVIR PHOSPHATE 75 MG PO CAPS
75.0000 mg | ORAL_CAPSULE | Freq: Two times a day (BID) | ORAL | 0 refills | Status: AC
  Filled 2024-02-14: qty 3, 2d supply, fill #0

## 2024-02-14 MED ORDER — ALBUTEROL SULFATE (2.5 MG/3ML) 0.083% IN NEBU
2.5000 mg | INHALATION_SOLUTION | RESPIRATORY_TRACT | 0 refills | Status: AC | PRN
  Filled 2024-02-14: qty 90, 5d supply, fill #0

## 2024-02-14 MED ORDER — PREDNISONE 10 MG (21) PO TBPK
ORAL_TABLET | ORAL | 0 refills | Status: DC
  Filled 2024-02-14: qty 21, 6d supply, fill #0

## 2024-02-14 NOTE — Plan of Care (Signed)

## 2024-02-14 NOTE — TOC Transition Note (Signed)
Transition of Care [facility]) - Discharge Note   Patient Details  Name: Latoya Roth MRN: [id number] Date of Birth: 03/30/1971  [facility] CM/SW Contact:  Gordy Clement, RN Phone Number: 02/14/2024, 11:12 AM   Clinical Narrative:    Patient will DC to home today. No TOC needs identified. Patient will follow up as directed on AVS              Patient Goals and CMS Choice            Discharge Placement                       Discharge Plan and Services Additional resources added to the After Visit Summary for                                       Social Drivers of Health (SDOH) Interventions SDOH Screenings   Food Insecurity: Food Insecurity Present (02/11/2024)  Housing: Low Risk  (02/11/2024)  Transportation Needs: No Transportation Needs (02/11/2024)  Utilities: Not At Risk (02/11/2024)  Depression (PHQ2-9): Low Risk  (12/03/2023)  Tobacco Use: Medium Risk (02/11/2024)     Readmission Risk Interventions     No data to display

## 2024-02-14 NOTE — Discharge Summary (Signed)
Physician Discharge Summary  ALYCEN MACK ZOX:[id number] DOB: 05-Jan-1971 DOA: 02/10/2024  PCP: Norm Salt, PA  Admit date: 02/10/2024 Discharge date: 02/14/2024  Admitted From: (Home) Disposition:  (Home )  Recommendations for Outpatient Follow-up:  Follow up with PCP in 1-2 weeks Please obtain BMP/CBC in one week    Diet recommendation: Heart Healthy / Carb Modified  Brief/Interim Summary:  Latoya Roth is a 53 y.o. female with medical history significant of type 2 diabetes, HIV, hypertension, CVA, asthma, allergic rhinitis, anxiety/depression, arthritis, Bell's palsy, obesity presented to ED with complaints of shortness breath and cough.  Vital signs on arrival: Temperature 101.1 F, pulse 92, respiratory rate 22, blood pressure 158/73, and SpO2 94% on room air.  Labs showing WBC count 16.0, hemoglobin 11.0 (baseline 12-13 range), MCV 79.5, sodium 133, potassium 3.0, chloride 96, AST 51 and remainder of LFTs normal, influenza A PCR positive, lactic acid normal, blood cultures collected.  Chest x-ray showing multifocal pulmonary opacities concerning for pneumonia. Patient was given Tylenol, DuoNeb, Solu-Medrol 125 mg, Tamiflu, oral potassium 40 mEq, ceftriaxone, azithromycin, and IV mag 2 g.     Influenza A with multifocal pneumonia Acute asthma exacerbation Acute respiratory failure with hypoxia -Presenting with fever, leukocytosis and slight tachypnea.  Influenza A PCR positive.  Chest x-ray showing multifocal pulmonary opacities concerning for pneumonia.  So we will continue with antibiotic including azithromycin and Rocephin, asthma exacerbation most likely provoked by influenza A infection -She was treated with Tamiflu, to continue on discharge to finish total of 5 days -Difficult wheezing, dyspnea, hypoxia initially, this has much improved, transition to oral prednisone, she will be discharged on prednisone taper -Treated with IV Rocephin and azithromycin during  hospital stay due to concern for pneumonia, no further antibiotics requirement on discharge -No further hypoxia, ambulated in the hallway today and yesterday on room air   Mild hypokalemia Replaced   Mild microcytic anemia Hemoglobin currently 11.0 and baseline appears to be in the 12-13 range.     Mild hyponatremia Symptomatic, continue to monitor   Type 2 diabetes uncontrolled, with hyperglycemia Not on insulin.  Last A1c 8.0 in April 2024, repeat is 7.7 . -CBG significantly elevated due to steroids, she was kept on insulin sliding scale, and Semglee during hospital stay, she was discharged on her home Glucotrol XL as well, as her steroids has been tapered CBG significantly improved, no insulin required on discharge   HIV CD4 count 681 in September 2024.  Continue Biktarvy.   Hypertension Blood pressure stable.  Continue losartan.   History of CVA Continue aspirin and Plavix   GERD Continue Protonix    Discharge Diagnoses:  Principal Problem:   Influenza A with pneumonia Active Problems:   Essential hypertension   Acute asthma exacerbation   HIV (human immunodeficiency virus infection) (HCC)   SIRS (systemic inflammatory response syndrome) (HCC)   Acute hypoxemic respiratory failure (HCC)   Hypokalemia    Discharge Instructions  Discharge Instructions     Discharge instructions   Complete by: As directed    Follow with Primary MD Norm Salt, PA in 7 days   Get CBC, CMP, 2 view Chest X ray checked  by Primary MD next visit.    Activity: As tolerated with Full fall precautions use walker/cane & assistance as needed   Disposition Home    Diet: Heart Healthy/ carb modified     On your next visit with your primary care physician please Get Medicines reviewed and adjusted.  Please request your Prim.MD to go over all Hospital Tests and Procedure/Radiological results at the follow up, please get all Hospital records sent to your Prim MD by signing  hospital release before you go home.   If you experience worsening of your admission symptoms, develop shortness of breath, life threatening emergency, suicidal or homicidal thoughts you must seek medical attention immediately by calling 911 or calling your MD immediately  if symptoms less severe.  You Must read complete instructions/literature along with all the possible adverse reactions/side effects for all the Medicines you take and that have been prescribed to you. Take any new Medicines after you have completely understood and accpet all the possible adverse reactions/side effects.   Do not drive, operating heavy machinery, perform activities at heights, swimming or participation in water activities or provide baby sitting services if your were admitted for syncope or siezures until you have seen by Primary MD or a Neurologist and advised to do so again.  Do not drive when taking Pain medications.    Do not take more than prescribed Pain, Sleep and Anxiety Medications  Special Instructions: If you have smoked or chewed Tobacco  in the last 2 yrs please stop smoking, stop any regular Alcohol  and or any Recreational drug use.  Wear Seat belts while driving.   Please note  You were cared for by a hospitalist during your hospital stay. If you have any questions about your discharge medications or the care you received while you were in the hospital after you are discharged, you can call the unit and asked to speak with the hospitalist on call if the hospitalist that took care of you is not available. Once you are discharged, your primary care physician will handle any further medical issues. Please note that NO REFILLS for any discharge medications will be authorized once you are discharged, as it is imperative that you return to your primary care physician (or establish a relationship with a primary care physician if you do not have one) for your aftercare needs so that they can reassess  your need for medications and monitor your lab values.   Increase activity slowly   Complete by: As directed       Allergies as of 02/14/2024       Reactions   Penicillins Anaphylaxis   Shellfish Allergy Anaphylaxis, Hives, Swelling   Amoxicillin Rash        Medication List     TAKE these medications    acetaminophen 325 MG tablet Commonly known as: TYLENOL Take 1-2 tablets (325-650 mg total) by mouth every 4 (four) hours as needed for mild pain.   aspirin EC 81 MG tablet Take 1 tablet (81 mg total) by mouth daily. Swallow whole.   BD Pen Needle Nano U/F 32G X 4 MM Misc Generic drug: Insulin Pen Needle Use as directed   Biktarvy 50-200-25 MG Tabs tablet Generic drug: bictegravir-emtricitabine-tenofovir AF Take 1 tablet by mouth daily.   budesonide-formoterol 160-4.5 MCG/ACT inhaler Commonly known as: SYMBICORT Inhale 2 puffs into the lungs 2 (two) times daily.   cetirizine 10 MG tablet Commonly known as: ZYRTEC Take 10 mg by mouth daily.   citalopram 20 MG tablet Commonly known as: CELEXA Take 1 tablet (20 mg total) by mouth daily.   clopidogrel 75 MG tablet Commonly known as: PLAVIX Take 75 mg by mouth daily.   Dexcom G7 Receiver Devi 1 each by Does not apply route daily.   Dexcom G7 Sensor Misc 1  each by Does not apply route daily.   ferrous sulfate 325 (65 FE) MG tablet Take 325 mg by mouth daily with breakfast.   folic acid 1 MG tablet Commonly known as: FOLVITE Take 1 mg by mouth daily.   glipiZIDE 10 MG 24 hr tablet Commonly known as: GLUCOTROL XL Take 10 mg by mouth daily.   losartan 50 MG tablet Commonly known as: COZAAR Take 1 tablet (50 mg total) by mouth daily.   omeprazole 20 MG capsule Commonly known as: PRILOSEC Take 20 mg by mouth daily.   oseltamivir 75 MG capsule Commonly known as: TAMIFLU Take 1 capsule (75 mg total) by mouth 2 (two) times daily.   predniSONE 10 MG (21) Tbpk tablet Commonly known as: STERAPRED UNI-PAK  21 TAB Please use per package instructions   topiramate 25 MG tablet Commonly known as: Topamax Take 1 tablet (25 mg total) by mouth at bedtime.   Ventolin HFA 108 (90 Base) MCG/ACT inhaler Generic drug: albuterol Inhale 1-2 puffs into the lungs every 6 (six) hours as needed for wheezing or shortness of breath. What changed: Another medication with the same name was added. Make sure you understand how and when to take each.   albuterol (2.5 MG/3ML) 0.083% nebulizer solution Commonly known as: PROVENTIL Take 3 mLs (2.5 mg total) by nebulization every 4 (four) hours as needed for wheezing or shortness of breath. What changed: You were already taking a medication with the same name, and this prescription was added. Make sure you understand how and when to take each.        Allergies  Allergen Reactions   Penicillins Anaphylaxis   Shellfish Allergy Anaphylaxis, Hives and Swelling   Amoxicillin Rash    Consultations: None   Procedures/Studies: DG Chest 2 View Result Date: 02/10/2024 CLINICAL DATA:  Cough.  Sick for 2 weeks. EXAM: CHEST - 2 VIEW COMPARISON:  04/30/2020 04/09/2023 FINDINGS: Lateral view is motion degraded and essentially nondiagnostic. Midline trachea. Borderline cardiomegaly. Atherosclerosis in the transverse aorta. No pleural effusion or pneumothorax. Left greater than right lower lobe airspace disease. Bilateral upper lobe more patchy, somewhat nodular opacities. These are all new since 04/09/2023. IMPRESSION: Multifocal pulmonary opacities, favoring pneumonia. Within the upper lobes, these are somewhat patchy and nodular in morphology. especially given this nodular morphology, recommend radiographic follow-up to confirm resolution. Aortic Atherosclerosis (ICD10-I70.0). Electronically Signed   By: Jeronimo Greaves M.D.   On: 02/10/2024 20:41      Subjective: Cough much improved, no dyspnea with activity  Discharge Exam: Vitals:   02/14/24 0755 02/14/24 0845  BP:  (!) 151/69   Pulse: 67 (!) 59  Resp:  18  Temp: 98.3 F (36.8 C)   SpO2: 90%    Vitals:   02/13/24 2354 02/14/24 0429 02/14/24 0755 02/14/24 0845  BP: (!) 170/82 (!) 150/57 (!) 151/69   Pulse: 75 (!) 55 67 (!) 59  Resp: 16 18  18   Temp: 98 F (36.7 C) 97.8 F (36.6 C) 98.3 F (36.8 C)   TempSrc: Oral Oral Oral   SpO2:  98% 90%   Weight:        General: Pt is alert, awake, not in acute distress Cardiovascular: RRR, S1/S2 +, no rubs, no gallops Respiratory: CTA bilaterally, no wheezing, no rhonchi Abdominal: Soft, NT, ND, bowel sounds + Extremities: no edema, no cyanosis    The results of significant diagnostics from this hospitalization (including imaging, microbiology, ancillary and laboratory) are listed below for reference.  Microbiology: Recent Results (from the past 240 hours)  Resp panel by RT-PCR (RSV, Flu A&B, Covid) Anterior Nasal Swab     Status: Abnormal   Collection Time: 02/10/24  7:25 PM   Specimen: Anterior Nasal Swab  Result Value Ref Range Status   SARS Coronavirus 2 by RT PCR NEGATIVE NEGATIVE Final   Influenza A by PCR POSITIVE (A) NEGATIVE Final   Influenza B by PCR NEGATIVE NEGATIVE Final    Comment: (NOTE) The Xpert Xpress SARS-CoV-2/FLU/RSV plus assay is intended as an aid in the diagnosis of influenza from Nasopharyngeal swab specimens and should not be used as a sole basis for treatment. Nasal washings and aspirates are unacceptable for Xpert Xpress SARS-CoV-2/FLU/RSV testing.  Fact Sheet for Patients: BloggerCourse.com  Fact Sheet for Healthcare Providers: SeriousBroker.it  This test is not yet approved or cleared by the [place] FDA and has been authorized for detection and/or diagnosis of SARS-CoV-2 by FDA under an Emergency Use Authorization (EUA). This EUA will remain in effect (meaning this test can be used) for the duration of the COVID-19 declaration under Section  564(b)(1) of the Act, 21 U.S.C. section 360bbb-3(b)(1), unless the authorization is terminated or revoked.     Resp Syncytial Virus by PCR NEGATIVE NEGATIVE Final    Comment: (NOTE) Fact Sheet for Patients: BloggerCourse.com  Fact Sheet for Healthcare Providers: SeriousBroker.it  This test is not yet approved or cleared by the [place] FDA and has been authorized for detection and/or diagnosis of SARS-CoV-2 by FDA under an Emergency Use Authorization (EUA). This EUA will remain in effect (meaning this test can be used) for the duration of the COVID-19 declaration under Section 564(b)(1) of the Act, 21 U.S.C. section 360bbb-3(b)(1), unless the authorization is terminated or revoked.  Performed at [facility], [address] [place], [place] [address]   Blood culture (routine x 2)     Status: None (Preliminary result)   Collection Time: 02/10/24 10:32 PM   Specimen: BLOOD  Result Value Ref Range Status   Specimen Description BLOOD LEFT ANTECUBITAL  Final   Special Requests   Final    BOTTLES DRAWN AEROBIC AND ANAEROBIC Blood Culture results may not be optimal due to an inadequate volume of blood received in culture bottles   Culture   Final    NO GROWTH 4 DAYS Performed at [facility], [address], [place], [place] [address]    Report Status PENDING  Incomplete  Blood culture (routine x 2)     Status: None (Preliminary result)   Collection Time: 02/10/24 10:32 PM   Specimen: BLOOD RIGHT HAND  Result Value Ref Range Status   Specimen Description BLOOD RIGHT HAND  Final   Special Requests   Final    BOTTLES DRAWN AEROBIC AND ANAEROBIC Blood Culture results may not be optimal due to an inadequate volume of blood received in culture bottles   Culture   Final    NO GROWTH 4 DAYS Performed at [facility], [address], [place], [place] [address]    Report Status PENDING  Incomplete  Respiratory  (~20 pathogens) panel by PCR     Status: Abnormal   Collection Time: 02/12/24  2:53 PM   Specimen: Nasopharyngeal Swab; Respiratory  Result Value Ref Range Status   Adenovirus NOT DETECTED NOT DETECTED Final   Coronavirus 229E NOT DETECTED NOT DETECTED Final    Comment: (NOTE) The Coronavirus on the Respiratory Panel, DOES NOT test for the  novel  Coronavirus (2019 nCoV)    Coronavirus HKU1 NOT DETECTED NOT DETECTED Final   Coronavirus NL63 NOT DETECTED NOT DETECTED Final   Coronavirus OC43 NOT DETECTED NOT DETECTED Final   Metapneumovirus NOT DETECTED NOT DETECTED Final   Rhinovirus / Enterovirus NOT DETECTED NOT DETECTED Final   Influenza A NOT DETECTED NOT DETECTED Final   Influenza B NOT DETECTED NOT DETECTED Final   Parainfluenza Virus 1 NOT DETECTED NOT DETECTED Final   Parainfluenza Virus 2 NOT DETECTED NOT DETECTED Final   Parainfluenza Virus 3 NOT DETECTED NOT DETECTED Final   Parainfluenza Virus 4 NOT DETECTED NOT DETECTED Final   Respiratory Syncytial Virus DETECTED (A) NOT DETECTED Final   Bordetella pertussis NOT DETECTED NOT DETECTED Final   Bordetella Parapertussis NOT DETECTED NOT DETECTED Final   Chlamydophila pneumoniae NOT DETECTED NOT DETECTED Final   Mycoplasma pneumoniae NOT DETECTED NOT DETECTED Final    Comment: Performed at [facility], [address] [place], [place] [address]     Labs: BNP (last 3 results) No results for input(s): "BNP" in the last 8760 hours. Basic Metabolic Panel: Recent Labs  Lab 02/10/24 1927 02/11/24 0720 02/11/24 1723 02/12/24 0442  NA 133* 134*  --  134*  K 3.0* 4.2  --  4.2  CL 96* 97*  --  96*  CO2 23 20*  --  24  GLUCOSE 91 430* 469* 374*  BUN 9 14  --  15  CREATININE 0.86 0.96  --  0.71  CALCIUM 8.0* 8.1*  --  8.1*  MG  --  2.1  --   --    Liver Function Tests: Recent Labs  Lab 02/10/24 1927 02/11/24 0720  AST 51* 37  ALT 30 30  ALKPHOS 64 67  BILITOT 0.9 1.1  PROT 7.6 7.5  ALBUMIN 2.5* 2.3*    No results for input(s): "LIPASE", "AMYLASE" in the last 168 hours. No results for input(s): "AMMONIA" in the last 168 hours. CBC: Recent Labs  Lab 02/10/24 1927 02/11/24 0720 02/12/24 0442  WBC 16.0* 12.2* 14.1*  NEUTROABS 12.1*  --   --   HGB 11.0* 11.0* 10.7*  HCT 34.2* 35.0* 33.3*  MCV 79.5* 81.6 79.9*  PLT 368 323 381   Cardiac Enzymes: No results for input(s): "CKTOTAL", "CKMB", "CKMBINDEX", "TROPONINI" in the last 168 hours. BNP: Invalid input(s): "POCBNP" CBG: Recent Labs  Lab 02/13/24 1233 02/13/24 1250 02/13/24 1700 02/13/24 2101 02/14/24 0800  GLUCAP 240* 257* 176* 108* 102*   D-Dimer No results for input(s): "DDIMER" in the last 72 hours. Hgb A1c No results for input(s): "HGBA1C" in the last 72 hours. Lipid Profile No results for input(s): "CHOL", "HDL", "LDLCALC", "TRIG", "CHOLHDL", "LDLDIRECT" in the last 72 hours. Thyroid function studies No results for input(s): "TSH", "T4TOTAL", "T3FREE", "THYROIDAB" in the last 72 hours.  Invalid input(s): "FREET3" Anemia work up No results for input(s): "VITAMINB12", "FOLATE", "FERRITIN", "TIBC", "IRON", "RETICCTPCT" in the last 72 hours. Urinalysis    Component Value Date/Time   COLORURINE YELLOW 04/11/2023 1543   APPEARANCEUR HAZY (A) 04/11/2023 1543   LABSPEC 1.014 04/11/2023 1543   PHURINE 5.0 04/11/2023 1543   GLUCOSEU NEGATIVE 04/11/2023 1543   HGBUR NEGATIVE 04/11/2023 1543   BILIRUBINUR NEGATIVE 04/11/2023 1543   BILIRUBINUR neg 01/15/2014 0933   KETONESUR NEGATIVE 04/11/2023 1543   PROTEINUR 30 (A) 04/11/2023 1543   UROBILINOGEN 1.0 03/13/2014 0902   NITRITE NEGATIVE 04/11/2023 1543   LEUKOCYTESUR NEGATIVE 04/11/2023 1543   Sepsis Labs  Recent Labs  Lab 02/10/24 1927 02/11/24 0720 02/12/24 0442  WBC 16.0* 12.2* 14.1*   Microbiology Recent Results (from the past 240 hours)  Resp panel by RT-PCR (RSV, Flu A&B, Covid) Anterior Nasal Swab     Status: Abnormal   Collection Time: 02/10/24   7:25 PM   Specimen: Anterior Nasal Swab  Result Value Ref Range Status   SARS Coronavirus 2 by RT PCR NEGATIVE NEGATIVE Final   Influenza A by PCR POSITIVE (A) NEGATIVE Final   Influenza B by PCR NEGATIVE NEGATIVE Final    Comment: (NOTE) The Xpert Xpress SARS-CoV-2/FLU/RSV plus assay is intended as an aid in the diagnosis of influenza from Nasopharyngeal swab specimens and should not be used as a sole basis for treatment. Nasal washings and aspirates are unacceptable for Xpert Xpress SARS-CoV-2/FLU/RSV testing.  Fact Sheet for Patients: BloggerCourse.com  Fact Sheet for Healthcare Providers: SeriousBroker.it  This test is not yet approved or cleared by the [place] FDA and has been authorized for detection and/or diagnosis of SARS-CoV-2 by FDA under an Emergency Use Authorization (EUA). This EUA will remain in effect (meaning this test can be used) for the duration of the COVID-19 declaration under Section 564(b)(1) of the Act, 21 U.S.C. section 360bbb-3(b)(1), unless the authorization is terminated or revoked.     Resp Syncytial Virus by PCR NEGATIVE NEGATIVE Final    Comment: (NOTE) Fact Sheet for Patients: BloggerCourse.com  Fact Sheet for Healthcare Providers: SeriousBroker.it  This test is not yet approved or cleared by the [place] FDA and has been authorized for detection and/or diagnosis of SARS-CoV-2 by FDA under an Emergency Use Authorization (EUA). This EUA will remain in effect (meaning this test can be used) for the duration of the COVID-19 declaration under Section 564(b)(1) of the Act, 21 U.S.C. section 360bbb-3(b)(1), unless the authorization is terminated or revoked.  Performed at [facility], [address] [place], [place] [address]   Blood culture (routine x 2)     Status: None (Preliminary result)   Collection Time: 02/10/24 10:32 PM    Specimen: BLOOD  Result Value Ref Range Status   Specimen Description BLOOD LEFT ANTECUBITAL  Final   Special Requests   Final    BOTTLES DRAWN AEROBIC AND ANAEROBIC Blood Culture results may not be optimal due to an inadequate volume of blood received in culture bottles   Culture   Final    NO GROWTH 4 DAYS Performed at [facility], [address], [place], [place] [address]    Report Status PENDING  Incomplete  Blood culture (routine x 2)     Status: None (Preliminary result)   Collection Time: 02/10/24 10:32 PM   Specimen: BLOOD RIGHT HAND  Result Value Ref Range Status   Specimen Description BLOOD RIGHT HAND  Final   Special Requests   Final    BOTTLES DRAWN AEROBIC AND ANAEROBIC Blood Culture results may not be optimal due to an inadequate volume of blood received in culture bottles   Culture   Final    NO GROWTH 4 DAYS Performed at [facility], [address], [place], [place] [address]    Report Status PENDING  Incomplete  Respiratory (~20 pathogens) panel by PCR     Status: Abnormal   Collection Time: 02/12/24  2:53 PM   Specimen: Nasopharyngeal Swab; Respiratory  Result Value Ref Range Status   Adenovirus NOT DETECTED NOT DETECTED Final   Coronavirus 229E NOT DETECTED NOT DETECTED  Final    Comment: (NOTE) The Coronavirus on the Respiratory Panel, DOES NOT test for the novel  Coronavirus (2019 nCoV)    Coronavirus HKU1 NOT DETECTED NOT DETECTED Final   Coronavirus NL63 NOT DETECTED NOT DETECTED Final   Coronavirus OC43 NOT DETECTED NOT DETECTED Final   Metapneumovirus NOT DETECTED NOT DETECTED Final   Rhinovirus / Enterovirus NOT DETECTED NOT DETECTED Final   Influenza A NOT DETECTED NOT DETECTED Final   Influenza B NOT DETECTED NOT DETECTED Final   Parainfluenza Virus 1 NOT DETECTED NOT DETECTED Final   Parainfluenza Virus 2 NOT DETECTED NOT DETECTED Final   Parainfluenza Virus 3 NOT DETECTED NOT DETECTED Final   Parainfluenza Virus 4 NOT DETECTED  NOT DETECTED Final   Respiratory Syncytial Virus DETECTED (A) NOT DETECTED Final   Bordetella pertussis NOT DETECTED NOT DETECTED Final   Bordetella Parapertussis NOT DETECTED NOT DETECTED Final   Chlamydophila pneumoniae NOT DETECTED NOT DETECTED Final   Mycoplasma pneumoniae NOT DETECTED NOT DETECTED Final    Comment: Performed at [facility], [address] [place], [place] [address]     Time coordinating discharge: Over 30 minutes  SIGNED:   Huey Bienenstock, MD  [facility] 02/14/2024, 2:46 PM Pager   If 7PM-7AM, please contact night-coverage [email or web address] Password [email or web address]

## 2024-02-15 LAB — CULTURE, BLOOD (ROUTINE X 2)
Culture: NO GROWTH
Culture: NO GROWTH

## 2024-02-24 ENCOUNTER — Other Ambulatory Visit: Payer: Self-pay

## 2024-02-24 DIAGNOSIS — I739 Peripheral vascular disease, unspecified: Secondary | ICD-10-CM

## 2024-02-25 ENCOUNTER — Encounter
Payer: Medicaid Other | Attending: Physical Medicine and Rehabilitation | Admitting: Physical Medicine and Rehabilitation

## 2024-02-25 ENCOUNTER — Encounter: Payer: Self-pay | Admitting: Physical Medicine and Rehabilitation

## 2024-02-25 VITALS — BP 139/78 | HR 91 | Ht 67.0 in | Wt 268.0 lb

## 2024-02-25 DIAGNOSIS — F1020 Alcohol dependence, uncomplicated: Secondary | ICD-10-CM | POA: Diagnosis present

## 2024-02-25 DIAGNOSIS — E1149 Type 2 diabetes mellitus with other diabetic neurological complication: Secondary | ICD-10-CM | POA: Diagnosis present

## 2024-02-25 DIAGNOSIS — H00011 Hordeolum externum right upper eyelid: Secondary | ICD-10-CM | POA: Diagnosis present

## 2024-02-25 DIAGNOSIS — I10 Essential (primary) hypertension: Secondary | ICD-10-CM | POA: Diagnosis not present

## 2024-02-25 DIAGNOSIS — Z794 Long term (current) use of insulin: Secondary | ICD-10-CM

## 2024-02-25 DIAGNOSIS — I639 Cerebral infarction, unspecified: Secondary | ICD-10-CM | POA: Diagnosis present

## 2024-02-25 NOTE — Patient Instructions (Addendum)
 D3/K2 supplement  Avoid soda/snacks  [place]

## 2024-02-25 NOTE — Progress Notes (Signed)
 Subjective:    Patient ID: Latoya Roth, female    DOB: 1971-11-11, 53 y.o.   MRN: [id number]  HPI Latoya Roth is a 53 year old woman who presents for follow-up of CVA, type 2 diabetes and obesity.  1) Type 2 DM -she does not usually eat breakfast -she has been taking short acting insulin --has not been checking her CBGs -she stopped the insulin as it was not sent to her  2) HTN -BP 139/78  3) CVA -she has been receiving therapy and improving -continues to use the RW -she walks with her cane sometimes -needs wheelchair to be adjusted  4) Obesity: -she thinks she can get rid of sprite -she would like to continue topamax -her current weight is 268 lbs  Pain Inventory Average Pain 5 Pain Right Now 5 My pain is  intermittent aching  BOWEL Number of stools per week: 3  BLADDER Normal  Mobility walk with assistance use a cane use a walker do you drive?  no  Function disabled: date disabled 10 yrs maybe  Neuro/Psych trouble walking  Prior Studies Any changes since last visit?  no  Physicians involved in your care Any changes since last visit?  no     Family History  Problem Relation Age of Onset   Hypertension Mother    Cancer Son    Social History   Socioeconomic History   Marital status: Legally Separated    Spouse name: Not on file   Number of children: Not on file   Years of education: Not on file   Highest education level: Not on file  Occupational History   Not on file  Tobacco Use   Smoking status: Former   Smokeless tobacco: Never  Vaping Use   Vaping status: Never Used  Substance and Sexual Activity   Alcohol use: Yes    Alcohol/week: 14.0 standard drinks of alcohol    Types: 14 Cans of beer per week    Comment: 2/day   Drug use: No   Sexual activity: Yes    Partners: Male    Birth control/protection: Condom    Comment: accepted condoms  Other Topics Concern   Not on file  Social History Narrative   Not on file    Social Drivers of Health   Financial Resource Strain: Not on file  Food Insecurity: Food Insecurity Present (02/11/2024)   Hunger Vital Sign    Worried About Running Out of Food in the Last Year: Sometimes true    Ran Out of Food in the Last Year: Sometimes true  Transportation Needs: No Transportation Needs (02/11/2024)   PRAPARE - Administrator, Civil Service (Medical): No    Lack of Transportation (Non-Medical): No  Physical Activity: Not on file  Stress: Not on file  Social Connections: Not on file   Past Surgical History:  Procedure Laterality Date   CHOLECYSTECTOMY     Past Medical History:  Diagnosis Date   Anxiety    Arthritis    Asthma    Bell's palsy    BV (bacterial vaginosis)    Depression    Diabetes mellitus    HIV disease (HCC)    Hypertension    Obesity    There were no vitals taken for this visit. BP recheck 133/82 Opioid Risk Score:   Fall Risk Score:  `1  Depression screen [facility] 2/9     12/03/2023    9:04 AM 09/17/2023    9:22 AM 09/02/2023  9:16 AM 07/02/2023    8:49 AM 05/03/2023    9:19 AM 03/21/2023   11:14 AM 03/22/2022   10:13 AM  Depression screen PHQ 2/9  Decreased Interest 0 0 0 0 2 0 0  Down, Depressed, Hopeless 0 0 0 0 0 0 0  PHQ - 2 Score 0 0 0 0 2 0 0  Altered sleeping     2    Tired, decreased energy     0    Change in appetite     0    Feeling bad or failure about yourself      0    Trouble concentrating     0    Moving slowly or fidgety/restless     0    Suicidal thoughts     0    PHQ-9 Score     4       Review of Systems  Constitutional: Negative.   HENT: Negative.    Eyes: Negative.   Respiratory: Negative.    Cardiovascular: Negative.   Gastrointestinal: Negative.   Endocrine: Negative.   Genitourinary: Negative.   Musculoskeletal: Negative.  Negative for gait problem.  Skin: Negative.   Neurological:  Positive for weakness.  Hematological: Negative.   All other systems reviewed and are  negative.      Objective:   Physical Exam Gen: no distress, normal appearing, weight 268  lbs, BMI 41.97  HEENT: oral mucosa pink and moist, NCAT Cardio: Reg rate Chest: normal effort, normal rate of breathing Abd: soft, non-distended Ext: no edema Psych: pleasant, normal affect Skin: intact Neuro: Alert and oriented x3 MSK: ambulating wit cane     Assessment & Plan:   1) Diabetes: -recommended using glucometer to check her blood sugars -discussed that she does not like Dexcom -encouraged walking after meals -discussed that she has not been checking her blood sugars -avoid sugar, bread, pasta, rice -avoid snacking -perform daily foot exam and at least annual eye exam -try to incorporate into your diet some of the following foods which are good for diabetes: 1) cinnamon- imitates effects of insulin, increasing glucose transport into cells ([place] or [place] cinnamon is best, least processed) 2) nuts- can slow down the blood sugar response of carbohydrate rich foods 3) oatmeal- contains and anti-inflammatory compound avenanthramide 4) whole-milk yogurt (best types are no sugar, [place] yogurt, or goat/sheep yogurt) 5) beans- high in protein, fiber, and vitamins, low glycemic index 6) broccoli- great source of vitamin A and C 7) quinoa- higher in protein and fiber than other grains 8) spinach- high in vitamin A, fiber, and protein 9) olive oil- reduces glucose levels, LDL, and triglycerides 10) salmon- excellent amount of omega-3-fatty acids 11) walnuts- rich in antioxidants 12) apples- high in fiber and quercetin 13) carrots- highly nutritious with low impact on blood sugar 14) eggs- improve HDL (good cholesterol), high in protein, keep you satiated 15) turmeric: improves blood sugars, cardiovascular disease, and protects kidney health 16) garlic: improves blood sugar, blood pressure, pain 17) tomatoes: highly nutritious with low impact on blood sugar   2) HTN: -BP  reviewed and is 139/78 -Advised checking BP daily at home and logging results to bring into follow-up appointment with PCP and myself. -Reviewed BP meds today.  -Advised checking BP daily at home and logging results to bring into follow-up appointment with PCP and myself. -Reviewed BP meds today.  -Advised regarding healthy foods that can help lower blood pressure and provided with a list: 1) citrus foods-  high in vitamins and minerals 2) salmon and other fatty fish - reduces inflammation and oxylipins 3) swiss chard (leafy green)- high level of nitrates 4) pumpkin seeds- one of the best natural sources of magnesium 5) Beans and lentils- high in fiber, magnesium, and potassium 6) Berries- high in flavonoids 7) Amaranth (whole grain, can be cooked similarly to rice and oats)- high in magnesium and fiber 8) P[place]achios- even more effective at reducing BP than other nuts 9) Carrots- high in phenolic compounds that relax blood vessels and reduce inflammation 10) Celery- contain phthalides that relax tissues of arterial walls 11) Tomatoes- can also improve cholesterol and reduce risk of heart disease 12) Broccoli- good source of magnesium, calcium, and potassium 13) Greek yogurt: high in potassium and calcium 14) Herbs and spices: Celery seed, cilantro, saffron, lemongrass, black cumin, ginseng, cinnamon, cardamom, sweet basil, and ginger 15) Chia and flax seeds- also help to lower cholesterol and blood sugar 16) Beets- high levels of nitrates that relax blood vessels  17) spinach and bananas- high in potassium  -Provided lise of supplements that can help with hypertension:  1) magnesium: one high quality brand is Bioptemizers since it contains all 7 types of magnesium, otherwise over the counter magnesium gluconate 400mg  is a good option 2) B vitamins 3) vitamin D 4) potassium 5) CoQ10 6) L-arginine 7) Vitamin C 8) Beetroot -Educated that goal BP is 120/80. -Made goal to incorporate some  of the above foods into diet.    3) CVA -continue HEP -continue RW -discussed that she is not getting out of the house much since it is too hot and her son is at work so can't walk with hi in the mall.  -referred to PT for WC eval  4) Headaches: -prescribed topamax to 25mg  HS  5) Alcoholism:  -advised eliminating alcohol -discussed 12 step program   6) Obesity: -continue topamax -recommended chewing on celery -discussed benefits of intermittent fasting -encouraged walk after every meal to maintain leg muscle mass/utilize the glucose from the meals -Educated regarding health benefits of weight loss- for pain, general health, chronic disease prevention, immune health, mental health.  -Will monitor weight every visit.  -Consider [place] tea daily.  -Discussed the benefits of intermittent fasting. -Discussed foods that can assist in weight loss: 1) leafy greens- high in fiber and nutrients 2) dark chocolate- improves metabolism (if prefer sweetened, best to sweeten with honey instead of sugar).  3) cruciferous vegetables- high in fiber and protein 4) full fat yogurt: high in healthy fat, protein, calcium, and probiotics 5) apples- high in a variety of phytochemicals 6) nuts- high in fiber and protein that increase feelings of fullness 7) grapefruit: rich in nutrients, antioxidants, and fiber (not to be taken with anticoagulation) 8) beans- high in protein and fiber 9) salmon- has high quality protein and healthy fats 10) green tea- rich in polyphenols 11) eggs- rich in choline and vitamin D 12) tuna- high protein, boosts metabolism 13) avocado- decreases visceral abdominal fat 14) chicken (pasture raised): high in protein and iron 15) blueberries- reduce abdominal fat and cholesterol 16) whole grains- decreases calories retained during digestion, speeds metabolism 17) chia seeds- curb appetite 18) chilies- increases fat metabolism  -Discussed supplements that can be used:  1)  Metatrim 400mg  BID 30 minutes before breakfast and dinner  2) Sphaeranthus indicus and Garcinia mangostana (combinations of these and #1 can be found in capsicum and zychrome  3) green coffee bean extract 400mg  twice per day or [place]ia (african  mango) 150 to 300mg  twice per day.  7) Headaches:  -referred to pulmonology for sleep apnea eval

## 2024-02-28 ENCOUNTER — Other Ambulatory Visit: Payer: Self-pay

## 2024-02-28 ENCOUNTER — Ambulatory Visit: Payer: Medicaid Other | Admitting: Internal Medicine

## 2024-02-28 ENCOUNTER — Encounter: Payer: Self-pay | Admitting: Internal Medicine

## 2024-02-28 VITALS — BP 146/82 | HR 69 | Temp 97.7°F | Ht 67.0 in | Wt 264.0 lb

## 2024-02-28 DIAGNOSIS — J09X1 Influenza due to identified novel influenza A virus with pneumonia: Secondary | ICD-10-CM | POA: Diagnosis not present

## 2024-02-28 DIAGNOSIS — Z6841 Body Mass Index (BMI) 40.0 and over, adult: Secondary | ICD-10-CM | POA: Diagnosis not present

## 2024-02-28 DIAGNOSIS — B20 Human immunodeficiency virus [HIV] disease: Secondary | ICD-10-CM | POA: Diagnosis present

## 2024-02-28 DIAGNOSIS — Z21 Asymptomatic human immunodeficiency virus [HIV] infection status: Secondary | ICD-10-CM

## 2024-02-28 DIAGNOSIS — E669 Obesity, unspecified: Secondary | ICD-10-CM

## 2024-02-28 NOTE — Assessment & Plan Note (Signed)
 Is doing well with therapy and no changes indicated.  Check her CD4 count and viral load today.  She otherwise can follow-up in 6 months.

## 2024-02-28 NOTE — Progress Notes (Signed)
   Subjective:    Patient ID: Latoya Roth, female    DOB: 1971/06/10, 53 y.o.   MRN: [id number]  HPI Latoya Roth is here for follow-up of HIV. She unfortunately was certainly hospitalized last month with influenza A positive multifocal pneumonia.  Quired treatment for flu as well as treatment for her asthma with inhalers.  She improved and is doing well now.  She has been taking her HIV medicine well with no issues getting or taking it.   Review of Systems  Constitutional:  Negative for fatigue.  Gastrointestinal:  Negative for diarrhea and nausea.  Skin:  Negative for rash.       Objective:   Physical Exam Eyes:     General: No scleral icterus. Pulmonary:     Effort: Pulmonary effort is normal.  Neurological:     Mental Status: She is alert.   SH: no tobacco        Assessment & Plan:

## 2024-02-28 NOTE — Assessment & Plan Note (Signed)
 I encouraged continued efforts with weight loss.

## 2024-02-28 NOTE — Assessment & Plan Note (Signed)
 She is improved now with no current symptoms, no shortness of breath or other concerns.  Encouraged flu vaccine for next year.

## 2024-03-02 ENCOUNTER — Ambulatory Visit (HOSPITAL_COMMUNITY)
Admission: RE | Admit: 2024-03-02 | Discharge: 2024-03-02 | Disposition: A | Discharge status: Home / Self Care | Payer: Medicaid Other | Source: Ambulatory Visit | Attending: Surgery | Admitting: Surgery

## 2024-03-02 ENCOUNTER — Ambulatory Visit (INDEPENDENT_AMBULATORY_CARE_PROVIDER_SITE_OTHER): Payer: Medicaid Other | Admitting: Surgery

## 2024-03-02 ENCOUNTER — Encounter: Payer: Self-pay | Admitting: Surgery

## 2024-03-02 VITALS — BP 152/72 | HR 63 | Temp 97.8°F | Ht 67.0 in | Wt 265.9 lb

## 2024-03-02 DIAGNOSIS — I70213 Atherosclerosis of native arteries of extremities with intermittent claudication, bilateral legs: Secondary | ICD-10-CM

## 2024-03-02 DIAGNOSIS — I739 Peripheral vascular disease, unspecified: Secondary | ICD-10-CM | POA: Diagnosis not present

## 2024-03-02 LAB — HIV-1 RNA QUANT-NO REFLEX-BLD
HIV 1 RNA Quant: 27 {copies}/mL — ABNORMAL HIGH
HIV-1 RNA Quant, Log: 1.43 {Log_copies}/mL — ABNORMAL HIGH

## 2024-03-02 LAB — T-HELPER CELLS (CD4) COUNT (NOT AT ARMC)
Absolute CD4: 724 {cells}/uL (ref 490–1740)
CD4 T Helper %: 31 % (ref 30–61)
Total lymphocyte count: 2304 {cells}/uL (ref 850–3900)

## 2024-03-02 LAB — VAS US ABI WITH/WO TBI
Left ABI: 1.03
Right ABI: 1.06

## 2024-03-02 NOTE — Progress Notes (Signed)
 [facility] [place]  Patient name: Latoya Roth MRN: [id number] DOB: [birth year] Sex: female   REQUESTING PROVIDER:    Norva Riffle   REASON FOR CONSULT:    PAD  HISTORY OF PRESENT ILLNESS:   Latoya Roth is a 53 y.o. female, who is referred for evaluation of PAD.  Complain of leg discomfort.  She denies cramping with walking.  Her biggest issue is leg swelling.  This has been going on for quite some time.  She will also get some numbness in her leg.   She is medically managed for hypertension.  She suffers from type 2 diabetes.  She is HIV positive.  She is a former smoker    PAST MEDICAL HISTORY    Past Medical History:  Diagnosis Date   Anxiety    Arthritis    Asthma    Bell's palsy    BV (bacterial vaginosis)    Depression    Diabetes mellitus    HIV disease (HCC)    Hypertension    Obesity      FAMILY HISTORY   Family History  Problem Relation Age of Onset   Hypertension Mother    Cancer Son     SOCIAL HISTORY:   Social History   Socioeconomic History   Marital status: Legally Separated    Spouse name: Not on file   Number of children: Not on file   Years of education: Not on file   Highest education level: Not on file  Occupational History   Not on file  Tobacco Use   Smoking status: Former   Smokeless tobacco: Never  Vaping Use   Vaping status: Never Used  Substance and Sexual Activity   Alcohol use: Yes    Alcohol/week: 14.0 standard drinks of alcohol    Types: 14 Cans of beer per week    Comment: 2/day   Drug use: No   Sexual activity: Yes    Partners: Male    Birth control/protection: Condom    Comment: accepted condoms  Other Topics Concern   Not on file  Social History Narrative   Not on file   Social Drivers of Health   Financial Resource Strain: Not on file  Food Insecurity: Food Insecurity Present (02/11/2024)   Hunger Vital Sign    Worried  About Running Out of Food in the Last Year: Sometimes true    Ran Out of Food in the Last Year: Sometimes true  Transportation Needs: No Transportation Needs (02/11/2024)   PRAPARE - Administrator, Civil Service (Medical): No    Lack of Transportation (Non-Medical): No  Physical Activity: Not on file  Stress: Not on file  Social Connections: Not on file  Intimate Partner Violence: Not At Risk (02/11/2024)   Humiliation, Afraid, Rape, and Kick questionnaire    Fear of Current or Ex-Partner: No    Emotionally Abused: No    Physically Abused: No    Sexually Abused: No    ALLERGIES:    Allergies  Allergen Reactions   Penicillins Anaphylaxis   Shellfish Allergy Anaphylaxis, Hives and Swelling   Amoxicillin Rash    CURRENT MEDICATIONS:    Current Outpatient Medications  Medication Sig Dispense Refill   acetaminophen (TYLENOL) 325 MG tablet Take 1-2 tablets (325-650 mg total) by mouth every 4 (four) hours as needed for mild pain.     albuterol (PROVENTIL) (2.5 MG/3ML) 0.083% nebulizer solution Take 3 mLs (2.5 mg total) by nebulization every  4 (four) hours as needed for wheezing or shortness of breath. 90 mL 0   aspirin EC 81 MG tablet Take 1 tablet (81 mg total) by mouth daily. Swallow whole. 30 tablet 0   bictegravir-emtricitabine-tenofovir AF (BIKTARVY) 50-200-25 MG TABS tablet Take 1 tablet by mouth daily. 30 tablet 11   budesonide-formoterol (SYMBICORT) 160-4.5 MCG/ACT inhaler Inhale 2 puffs into the lungs 2 (two) times daily.     cetirizine (ZYRTEC) 10 MG tablet Take 10 mg by mouth daily.     citalopram (CELEXA) 20 MG tablet Take 1 tablet (20 mg total) by mouth daily. 30 tablet 0   clopidogrel (PLAVIX) 75 MG tablet Take 75 mg by mouth daily.     Continuous Glucose Receiver (DEXCOM G7 RECEIVER) DEVI 1 each by Does not apply route daily. 1 each 0   Continuous Glucose Sensor (DEXCOM G7 SENSOR) MISC 1 each by Does not apply route daily. 1 each 0   ferrous sulfate 325 (65  FE) MG tablet Take 325 mg by mouth daily with breakfast.     folic acid (FOLVITE) 1 MG tablet Take 1 mg by mouth daily.     glipiZIDE (GLUCOTROL XL) 10 MG 24 hr tablet Take 10 mg by mouth daily.     Insulin Pen Needle 32G X 4 MM MISC Use as directed 100 each 0   losartan (COZAAR) 50 MG tablet Take 1 tablet (50 mg total) by mouth daily.     omeprazole (PRILOSEC) 20 MG capsule Take 20 mg by mouth daily.     oseltamivir (TAMIFLU) 75 MG capsule Take 1 capsule (75 mg total) by mouth 2 (two) times daily. 3 capsule 0   topiramate (TOPAMAX) 25 MG tablet Take 1 tablet (25 mg total) by mouth at bedtime. 90 tablet 3   VENTOLIN HFA 108 (90 Base) MCG/ACT inhaler Inhale 1-2 puffs into the lungs every 6 (six) hours as needed for wheezing or shortness of breath.     No current facility-administered medications for this visit.    REVIEW OF SYSTEMS:   [X]  denotes positive finding, [ ]  denotes negative finding Cardiac  Comments:  Chest pain or chest pressure:    Shortness of breath upon exertion:    Short of breath when lying flat:    Irregular heart rhythm:        Vascular    Pain in calf, thigh, or hip brought on by ambulation:    Pain in feet at night that wakes you up from your sleep:     Blood clot in your veins:    Leg swelling:  x       Pulmonary    Oxygen at home:    Productive cough:     Wheezing:         Neurologic    Sudden weakness in arms or legs:     Sudden numbness in arms or legs:     Sudden onset of difficulty speaking or slurred speech:    Temporary loss of vision in one eye:     Problems with dizziness:         Gastrointestinal    Blood in stool:      Vomited blood:         Genitourinary    Burning when urinating:     Blood in urine:        Psychiatric    Major depression:         Hematologic    Bleeding problems:    Problems  with blood clotting too easily:        Skin    Rashes or ulcers:        Constitutional    Fever or chills:     PHYSICAL EXAM:    Vitals:   03/02/24 0954  BP: (!) 152/72  Pulse: 63  Temp: 97.8 F (36.6 C)  TempSrc: Temporal  SpO2: 95%  Weight: 265 lb 14.4 oz (120.6 kg)  Height: 5\' 7"  (1.702 m)    GENERAL: The patient is a well-nourished female, in no acute distress. The vital signs are documented above. CARDIAC: There is a regular rate and rhythm.  VASCULAR: Palpable pedal pulses.  Left leg edema, nonpitting PULMONARY: Nonlabored respirations  MUSCULOSKELETAL: There are no major deformities or cyanosis. NEUROLOGIC: No focal weakness or paresthesias are detected. SKIN: There are no ulcers or rashes noted. PSYCHIATRIC: The patient has a normal affect.  STUDIES:   I have reviewed the following: +-------+-----------+-----------+------------+------------+  ABI/TBIToday's ABIToday's TBIPrevious ABIPrevious TBI  +-------+-----------+-----------+------------+------------+  Right 1.06       0.01                                 +-------+-----------+-----------+------------+------------+  Left  1.03       0.82                                 +-------+-----------+-----------+------------+------------+  Right toe pressure: 173 Left toe pressure: 141 All waveforms are triphasic  ASSESSMENT and PLAN   PAD: Patient's ultrasound today shows no significant vascular disease.  All waveforms are triphasic with normal toe pressures and ABIs.  She does not need any further workup for this.  Leg swelling: This appears to be her biggest complaint.  She has chronic left leg edema.  This most likely has lymphedema.  I am giving her a pair of compression socks today, 20-30.  She will follow-up in 3 months with reflux studies to see if there is any options to improve her leg swelling.  I also encouraged her to keep her legs elevated is much as possible and to be involved in exercise   Durene Cal, IV, MD, FACS [facility] [phone] Pager [phone]

## 2024-03-05 ENCOUNTER — Other Ambulatory Visit: Payer: Self-pay | Admitting: *Deleted

## 2024-03-05 DIAGNOSIS — I739 Peripheral vascular disease, unspecified: Secondary | ICD-10-CM

## 2024-03-05 DIAGNOSIS — I70213 Atherosclerosis of native arteries of extremities with intermittent claudication, bilateral legs: Secondary | ICD-10-CM

## 2024-04-08 ENCOUNTER — Emergency Department (HOSPITAL_COMMUNITY)

## 2024-04-08 ENCOUNTER — Encounter (HOSPITAL_COMMUNITY): Payer: Self-pay

## 2024-04-08 ENCOUNTER — Other Ambulatory Visit: Payer: Self-pay

## 2024-04-08 ENCOUNTER — Emergency Department (HOSPITAL_COMMUNITY)
Admission: EM | Admit: 2024-04-08 | Discharge: 2024-04-08 | Disposition: A | Discharge status: Home / Self Care | Attending: Emergency Medicine | Admitting: Emergency Medicine

## 2024-04-08 DIAGNOSIS — Z7982 Long term (current) use of aspirin: Secondary | ICD-10-CM | POA: Diagnosis not present

## 2024-04-08 DIAGNOSIS — W19XXXA Unspecified fall, initial encounter: Secondary | ICD-10-CM | POA: Diagnosis not present

## 2024-04-08 DIAGNOSIS — Z21 Asymptomatic human immunodeficiency virus [HIV] infection status: Secondary | ICD-10-CM | POA: Diagnosis not present

## 2024-04-08 DIAGNOSIS — E119 Type 2 diabetes mellitus without complications: Secondary | ICD-10-CM | POA: Diagnosis not present

## 2024-04-08 DIAGNOSIS — S6992XA Unspecified injury of left wrist, hand and finger(s), initial encounter: Secondary | ICD-10-CM | POA: Diagnosis present

## 2024-04-08 DIAGNOSIS — J45909 Unspecified asthma, uncomplicated: Secondary | ICD-10-CM | POA: Diagnosis not present

## 2024-04-08 DIAGNOSIS — S52502A Unspecified fracture of the lower end of left radius, initial encounter for closed fracture: Secondary | ICD-10-CM | POA: Insufficient documentation

## 2024-04-08 DIAGNOSIS — Z7902 Long term (current) use of antithrombotics/antiplatelets: Secondary | ICD-10-CM | POA: Diagnosis not present

## 2024-04-08 DIAGNOSIS — S62102A Fracture of unspecified carpal bone, left wrist, initial encounter for closed fracture: Secondary | ICD-10-CM

## 2024-04-08 DIAGNOSIS — I10 Essential (primary) hypertension: Secondary | ICD-10-CM | POA: Insufficient documentation

## 2024-04-08 MED ORDER — ACETAMINOPHEN 500 MG PO TABS
1000.0000 mg | ORAL_TABLET | Freq: Once | ORAL | Status: AC
Start: 2024-04-08 — End: 2024-04-08
  Administered 2024-04-08: 1000 mg via ORAL
  Filled 2024-04-08: qty 2

## 2024-04-08 MED ORDER — OXYCODONE HCL 5 MG PO TABS
5.0000 mg | ORAL_TABLET | Freq: Once | ORAL | Status: AC
  Administered 2024-04-08: 5 mg via ORAL
  Filled 2024-04-08: qty 1

## 2024-04-08 MED ORDER — OXYCODONE-ACETAMINOPHEN 5-325 MG PO TABS
1.0000 | ORAL_TABLET | Freq: Once | ORAL | Status: AC
  Administered 2024-04-08: 1 via ORAL
  Filled 2024-04-08: qty 1

## 2024-04-08 MED ORDER — OXYCODONE-ACETAMINOPHEN 5-325 MG PO TABS: 1.0000 | ORAL_TABLET | ORAL | 0 refills | Status: AC | PRN

## 2024-04-08 NOTE — ED Provider Notes (Signed)
 [place] EMERGENCY DEPARTMENT AT [facility] Provider Note   CSN: [id number] Arrival date & time: 04/08/24  0857     History  Chief Complaint  Patient presents with   Wrist Injury    Latoya Roth is a 53 y.o. female.  The history is provided by the patient and medical records. No language interpreter was used.  Wrist Injury Location:  Wrist Wrist location:  L wrist Injury: yes   Time since incident:  1 day Mechanism of injury: fall   Fall:    Point of impact:  Outstretched arms   Entrapped after fall: no   Pain details:    Quality:  Aching   Radiates to:  Does not radiate   Severity:  Severe   Onset quality:  Sudden   Duration:  1 day   Timing:  Constant   Progression:  Unchanged Handedness:  Right-handed Tetanus status:  Unknown Prior injury to area:  No Relieved by:  Nothing Worsened by:  Movement Ineffective treatments:  None tried Associated symptoms: no back pain, no decreased range of motion, no fatigue, no muscle weakness, no neck pain, no numbness and no stiffness        Home Medications Prior to Admission medications   Medication Sig Start Date End Date Taking? Authorizing Provider  acetaminophen (TYLENOL) 325 MG tablet Take 1-2 tablets (325-650 mg total) by mouth every 4 (four) hours as needed for mild pain. 04/24/23   Setzer, Lynnell Jude, PA-C  albuterol (PROVENTIL) (2.5 MG/3ML) 0.083% nebulizer solution Take 3 mLs (2.5 mg total) by nebulization every 4 (four) hours as needed for wheezing or shortness of breath. 02/14/24   Elgergawy, Leana Roe, MD  aspirin EC 81 MG tablet Take 1 tablet (81 mg total) by mouth daily. Swallow whole. 04/25/23   Setzer, Lynnell Jude, PA-C  bictegravir-emtricitabine-tenofovir AF (BIKTARVY) 50-200-25 MG TABS tablet Take 1 tablet by mouth daily. 09/17/23   Gardiner Barefoot, MD  budesonide-formoterol (SYMBICORT) 160-4.5 MCG/ACT inhaler Inhale 2 puffs into the lungs 2 (two) times daily.    [provider]   cetirizine (ZYRTEC) 10 MG tablet Take 10 mg by mouth daily.    [provider]  citalopram (CELEXA) 20 MG tablet Take 1 tablet (20 mg total) by mouth daily. 04/24/23   Setzer, Lynnell Jude, PA-C  clopidogrel (PLAVIX) 75 MG tablet Take 75 mg by mouth daily. 09/13/23   [provider]  Continuous Glucose Receiver (DEXCOM G7 RECEIVER) DEVI 1 each by Does not apply route daily. 05/03/23   Raulkar, Drema Pry, MD  Continuous Glucose Sensor (DEXCOM G7 SENSOR) MISC 1 each by Does not apply route daily. 05/03/23   Raulkar, Drema Pry, MD  ferrous sulfate 325 (65 FE) MG tablet Take 325 mg by mouth daily with breakfast.    [provider]  folic acid (FOLVITE) 1 MG tablet Take 1 mg by mouth daily.    [provider]  glipiZIDE (GLUCOTROL XL) 10 MG 24 hr tablet Take 10 mg by mouth daily. 09/13/23   [provider]  Insulin Pen Needle 32G X 4 MM MISC Use as directed 04/24/23     losartan (COZAAR) 50 MG tablet Take 1 tablet (50 mg total) by mouth daily. 04/24/23   Setzer, Lynnell Jude, PA-C  omeprazole (PRILOSEC) 20 MG capsule Take 20 mg by mouth daily.    [provider]  oseltamivir (TAMIFLU) 75 MG capsule Take 1 capsule (75 mg total) by mouth 2 (two) times daily. 02/14/24  Elgergawy, Ardia Kraft, MD  topiramate (TOPAMAX) 25 MG tablet Take 1 tablet (25 mg total) by mouth at bedtime. 07/02/23   Raulkar, Keven Pel, MD  VENTOLIN HFA 108 (90 Base) MCG/ACT inhaler Inhale 1-2 puffs into the lungs every 6 (six) hours as needed for wheezing or shortness of breath. 01/16/24   [provider]      Allergies    Penicillins, Shellfish allergy, and Amoxicillin    Review of Systems   Review of Systems  Constitutional:  Negative for chills and fatigue.  HENT:  Negative for congestion.   Respiratory:  Negative for cough, chest tightness, shortness of breath and wheezing.   Cardiovascular:  Negative for chest pain, palpitations and leg swelling.  Gastrointestinal:  Negative for  abdominal pain, constipation, diarrhea, nausea and vomiting.  Genitourinary:  Negative for enuresis and flank pain.  Musculoskeletal:  Negative for back pain, neck pain and stiffness.  Skin:  Negative for rash.  Neurological:  Positive for weakness (r lag at baseline per pt). Negative for dizziness, light-headedness and headaches.  All other systems reviewed and are negative.   Physical Exam Updated Vital Signs BP 139/89   Pulse 73   Temp 98.3 F (36.8 C)   Resp 16   Ht 5\' 7"  (1.702 m)   Wt 120.6 kg   SpO2 100%   BMI 41.64 kg/m  Physical Exam Vitals and nursing note reviewed.  Constitutional:      General: She is not in acute distress.    Appearance: She is well-developed. She is not ill-appearing, toxic-appearing or diaphoretic.  HENT:     Head: Normocephalic and atraumatic.     Mouth/Throat:     Mouth: Mucous membranes are moist.  Eyes:     Extraocular Movements: Extraocular movements intact.     Conjunctiva/sclera: Conjunctivae normal.     Pupils: Pupils are equal, round, and reactive to light.  Cardiovascular:     Rate and Rhythm: Normal rate and regular rhythm.     Heart sounds: No murmur heard. Pulmonary:     Effort: Pulmonary effort is normal. No respiratory distress.     Breath sounds: Normal breath sounds. No wheezing, rhonchi or rales.  Chest:     Chest wall: No tenderness.  Abdominal:     General: Abdomen is flat.     Palpations: Abdomen is soft.     Tenderness: There is no abdominal tenderness.  Musculoskeletal:        General: Swelling, tenderness and signs of injury present.     Cervical back: Neck supple.  Skin:    General: Skin is warm and dry.     Capillary Refill: Capillary refill takes less than 2 seconds.     Findings: No erythema.  Neurological:     Mental Status: She is alert. Mental status is at baseline.  Psychiatric:        Mood and Affect: Mood normal.     ED Results / Procedures / Treatments   Labs (all labs ordered are listed,  but only abnormal results are displayed) Labs Reviewed - No data to display  EKG None  Radiology DG Wrist Complete Left Result Date: 04/08/2024 CLINICAL DATA:  Injury. Patient was pulled down to ground when walking dog. Tried to brace fall left hand. Pain. EXAM: LEFT WRIST - COMPLETE 3+ VIEW COMPARISON:  None Available. FINDINGS: There is a comminuted fracture of the distal lateral, volar aspect of the radius, predominantly involving the radial styloid. On frontal view there is up  to 2 mm lateral/radial cortical step-off. The fracture lines extend through the distal radial articular surface. There is age indeterminate moderate volar extension of the distal volar radial cortex on lateral view. This may be part of the current acute fracture or the sequela of remote trauma. 2 mm ulnar negative variance. There is relative widening of the scapholunate interval up to 5 mm compared to the lunotriquetral interval measuring 1.5 mm. Mild triscaphe joint space narrowing. Moderate thumb carpometacarpal joint space narrowing, subchondral sclerosis, and peripheral osteophytosis. IMPRESSION: 1. Acute comminuted fracture of the distal lateral, volar aspect of the radius, predominantly involving the radial styloid. The fracture lines extend through the distal radial articular surface. 2. Age indeterminate moderate volar extension of the distal volar radial cortex on lateral view. This may be part of the current acute fracture or the sequela of remote trauma. Recommend clinical correlation for point tenderness. 3. Relative widening of the scapholunate interval up to 5 mm compared to the lunotriquetral interval measuring 1.5 mm. 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 4. Moderate thumb carpometacarpal osteoarthritis. Electronically Signed   By: Bertina Broccoli M.D.   On: 04/08/2024 11:07    Procedures Procedures    Medications Ordered in ED Medications  acetaminophen (TYLENOL) tablet 1,000 mg  (1,000 mg Oral Given 04/08/24 0954)  oxyCODONE (Oxy IR/ROXICODONE) immediate release tablet 5 mg (5 mg Oral Given 04/08/24 1324)    ED Course/ Medical Decision Making/ A&P                                 Medical Decision Making Amount and/or Complexity of Data Reviewed Radiology: ordered.  Risk Prescription drug management.    Latoya Roth is a 53 y.o. female with a past medical history significant for hypertension, diabetes, asthma, previous stroke with residual right leg weakness, and HIV who presents for left wrist injury.  According to patient, she was walking her small [place] dog yesterday when her dog saw another dog and jerked her arm.  Patient fell to the ground on outstretched arms and hurt her left wrist.  She reports has been hurting and she presents for evaluation.  She reports some swelling and discomfort.  She is right-handed.  She denies any other injuries or trauma.  Otherwise, she denies any other symptoms including no fevers, chills, congestion, cough or other complaints.  On exam, patient has tenderness and swelling to the left wrist but was able to wiggle fingers and had intact sensation.  Good cap refill.  Good pulses.  Elbow and shoulder nontender.  Rest of exam unchanged from her baseline she reports.  X-rays were obtained and show evidence of a wrist fracture.  I called and spoke with Michael Jeffrey with the orthopedic hand team who recommended a short volar splint on the left \\arm  and follow-up with Dr. Broadus Canes in clinic.  We did ask if other splinting would be necessary such as thumb spica but he felt that the volar splint would be best at this time.  Patient was given dose of pain medicine.  She will get prescription for Percocet to go home with and she was able to wiggle her fingers and had good sensation and cap refill on my reassessment after splinting.  Patient will follow-up with orthopedics and PCP and was discharged in good  condition.         Final Clinical Impression(s) / ED Diagnoses Final diagnoses:  Closed fracture of  distal end of left radius, unspecified fracture morphology, initial encounter  Closed fracture of left wrist, initial encounter    Rx / DC Orders ED Discharge Orders          Ordered    oxyCODONE-acetaminophen (PERCOCET/ROXICET) 5-325 MG tablet  Every 4 hours PRN        04/08/24 1405            Clinical Impression: 1. Closed fracture of distal end of left radius, unspecified fracture morphology, initial encounter   2. Closed fracture of left wrist, initial encounter     Disposition: Discharge  Condition: Good  I have discussed the results, Dx and Tx plan with the pt(& family if present). He/she/they expressed understanding and agree(s) with the plan. Discharge instructions discussed at great length. Strict return precautions discussed and pt &/or family have verbalized understanding of the instructions. No further questions at time of discharge.    New Prescriptions   OXYCODONE-ACETAMINOPHEN (PERCOCET/ROXICET) 5-325 MG TABLET    Take 1 tablet by mouth every 4 (four) hours as needed for severe pain (pain score 7-10).    Follow Up: Saundra Curl, MD [address] [place] [address] [phone]   with orthopedics  Dianah Fort, [place] [address] [place] [address] [phone]        [place] Hankin, Marine Sia, MD 04/08/24 1426

## 2024-04-08 NOTE — Discharge Instructions (Signed)
 Your history, exam, and workup today revealed a left wrist fracture from the fall yesterday.  I spoke to orthopedics who recommended splinting and follow-up in clinic with them next week.  Please call to schedule an appointment.  Please rest and stay hydrated.  If any symptoms change or worsen acutely, please return to the nearest emergency department.

## 2024-04-08 NOTE — ED Triage Notes (Addendum)
 Pt states she was walking her dog on a leash yesterday and her dog seen another dog and pulled pt to ground and pt injured her left wrist. Pt states she tried to brace her fall with her left hand and injured left wrist. Pt has 2+ swelling of left wrist. Pt has 2+ left radial pulse, cap refill less than 3 sec, warm to touch

## 2024-04-08 NOTE — Progress Notes (Signed)
 Orthopedic Tech Progress Note Patient Details:  JURY CASERTA 02/08/1971 [id number]  Ortho Devices Type of Ortho Device: Ace wrap, Cotton web roll, Volar splint, Arm sling Ortho Device/Splint Location: LUE Ortho Device/Splint Interventions: Application, Ordered, Adjustment   Post Interventions Patient Tolerated: Fair Instructions Provided: Care of device  Kermitt Pedlar 04/08/2024, 1:32 PM

## 2024-04-14 ENCOUNTER — Other Ambulatory Visit: Payer: Self-pay | Admitting: Orthopedic Surgery

## 2024-04-14 DIAGNOSIS — S62102A Fracture of unspecified carpal bone, left wrist, initial encounter for closed fracture: Secondary | ICD-10-CM

## 2024-04-16 ENCOUNTER — Ambulatory Visit
Admission: RE | Admit: 2024-04-16 | Discharge: 2024-04-16 | Disposition: A | Discharge status: Home / Self Care | Source: Ambulatory Visit | Attending: Orthopedic Surgery

## 2024-04-16 DIAGNOSIS — S62102A Fracture of unspecified carpal bone, left wrist, initial encounter for closed fracture: Secondary | ICD-10-CM

## 2024-05-22 NOTE — Progress Notes (Signed)
 The ASCVD Risk score (Arnett DK, et al., 2019) failed to calculate for the following reasons:   Risk score cannot be calculated because patient has a medical history suggesting prior/existing ASCVD  Arlon Bergamo, BSN, RN

## 2024-05-26 ENCOUNTER — Encounter: Admitting: Physical Medicine and Rehabilitation

## 2024-06-01 ENCOUNTER — Encounter: Attending: Physical Medicine and Rehabilitation | Admitting: Physical Medicine and Rehabilitation

## 2024-06-01 ENCOUNTER — Ambulatory Visit (HOSPITAL_COMMUNITY)

## 2024-06-01 ENCOUNTER — Ambulatory Visit

## 2024-06-01 DIAGNOSIS — E1149 Type 2 diabetes mellitus with other diabetic neurological complication: Secondary | ICD-10-CM | POA: Insufficient documentation

## 2024-06-01 DIAGNOSIS — I639 Cerebral infarction, unspecified: Secondary | ICD-10-CM | POA: Insufficient documentation

## 2024-06-01 DIAGNOSIS — F1020 Alcohol dependence, uncomplicated: Secondary | ICD-10-CM | POA: Insufficient documentation

## 2024-06-01 DIAGNOSIS — I10 Essential (primary) hypertension: Secondary | ICD-10-CM | POA: Insufficient documentation

## 2024-06-01 DIAGNOSIS — H00011 Hordeolum externum right upper eyelid: Secondary | ICD-10-CM | POA: Insufficient documentation

## 2024-06-15 ENCOUNTER — Other Ambulatory Visit: Payer: Self-pay | Admitting: Physical Medicine and Rehabilitation

## 2024-08-01 ENCOUNTER — Emergency Department (HOSPITAL_COMMUNITY)
Admission: EM | Admit: 2024-08-01 | Discharge: 2024-08-02 | Disposition: A | Discharge status: Home / Self Care | Attending: Emergency Medicine | Admitting: Emergency Medicine

## 2024-08-01 ENCOUNTER — Encounter (HOSPITAL_COMMUNITY): Payer: Self-pay | Admitting: Emergency Medicine

## 2024-08-01 ENCOUNTER — Other Ambulatory Visit: Payer: Self-pay

## 2024-08-01 DIAGNOSIS — Z794 Long term (current) use of insulin: Secondary | ICD-10-CM | POA: Insufficient documentation

## 2024-08-01 DIAGNOSIS — Z21 Asymptomatic human immunodeficiency virus [HIV] infection status: Secondary | ICD-10-CM | POA: Insufficient documentation

## 2024-08-01 DIAGNOSIS — Z7902 Long term (current) use of antithrombotics/antiplatelets: Secondary | ICD-10-CM | POA: Insufficient documentation

## 2024-08-01 DIAGNOSIS — Z79899 Other long term (current) drug therapy: Secondary | ICD-10-CM | POA: Insufficient documentation

## 2024-08-01 DIAGNOSIS — S0083XA Contusion of other part of head, initial encounter: Secondary | ICD-10-CM | POA: Diagnosis not present

## 2024-08-01 DIAGNOSIS — Z8673 Personal history of transient ischemic attack (TIA), and cerebral infarction without residual deficits: Secondary | ICD-10-CM | POA: Insufficient documentation

## 2024-08-01 DIAGNOSIS — I10 Essential (primary) hypertension: Secondary | ICD-10-CM | POA: Diagnosis not present

## 2024-08-01 DIAGNOSIS — Z7982 Long term (current) use of aspirin: Secondary | ICD-10-CM | POA: Insufficient documentation

## 2024-08-01 DIAGNOSIS — Z87891 Personal history of nicotine dependence: Secondary | ICD-10-CM | POA: Insufficient documentation

## 2024-08-01 DIAGNOSIS — J45909 Unspecified asthma, uncomplicated: Secondary | ICD-10-CM | POA: Diagnosis not present

## 2024-08-01 DIAGNOSIS — E119 Type 2 diabetes mellitus without complications: Secondary | ICD-10-CM | POA: Diagnosis not present

## 2024-08-01 DIAGNOSIS — F10120 Alcohol abuse with intoxication, uncomplicated: Secondary | ICD-10-CM | POA: Diagnosis not present

## 2024-08-01 DIAGNOSIS — S060X0A Concussion without loss of consciousness, initial encounter: Secondary | ICD-10-CM | POA: Insufficient documentation

## 2024-08-01 DIAGNOSIS — Y906 Blood alcohol level of 120-199 mg/100 ml: Secondary | ICD-10-CM | POA: Diagnosis not present

## 2024-08-01 DIAGNOSIS — Z7951 Long term (current) use of inhaled steroids: Secondary | ICD-10-CM | POA: Insufficient documentation

## 2024-08-01 DIAGNOSIS — W01198A Fall on same level from slipping, tripping and stumbling with subsequent striking against other object, initial encounter: Secondary | ICD-10-CM | POA: Insufficient documentation

## 2024-08-01 DIAGNOSIS — Y92000 Kitchen of unspecified non-institutional (private) residence as  the place of occurrence of the external cause: Secondary | ICD-10-CM | POA: Insufficient documentation

## 2024-08-01 DIAGNOSIS — F10920 Alcohol use, unspecified with intoxication, uncomplicated: Secondary | ICD-10-CM

## 2024-08-01 DIAGNOSIS — Z7984 Long term (current) use of oral hypoglycemic drugs: Secondary | ICD-10-CM | POA: Insufficient documentation

## 2024-08-01 DIAGNOSIS — S032XXA Dislocation of tooth, initial encounter: Secondary | ICD-10-CM | POA: Insufficient documentation

## 2024-08-01 DIAGNOSIS — S0990XA Unspecified injury of head, initial encounter: Secondary | ICD-10-CM | POA: Diagnosis present

## 2024-08-01 MED ORDER — HYDROCODONE-ACETAMINOPHEN 5-325 MG PO TABS
1.0000 | ORAL_TABLET | Freq: Once | ORAL | Status: AC
  Administered 2024-08-02: 1 via ORAL
  Filled 2024-08-01: qty 1

## 2024-08-01 NOTE — ED Triage Notes (Signed)
 Pt via [facility] from home after fall on thinners. Pt had a stroke last year and has some residual weakness and her leg gave out from underneath her while walking at home. ETOH on board per EMS. Pt knocked her face on the floor on the way down and knocked out a lower front tooth. Hematoma over left eye noted. Slight bleeding from mouth, controlled at time of arrival. A/o x 4. Hx DM2, HTN, CVA, HIV+, COPD.   BP 140/80 HR 90 O2 94% RA CBG 266

## 2024-08-01 NOTE — ED Provider Notes (Signed)
 [place] EMERGENCY DEPARTMENT AT [facility] Provider Note   CSN: [id number] Arrival date & time: 08/01/24  2302     Patient presents with: Latoya Roth is a 53 y.o. female.  {Add pertinent medical, surgical, social history, OB history to YEP:67052} The history is provided by the patient.  Patient w/history of HIV, diabetes, obesity previous CVA presents after fall. Patient reports she was in her kitchen and her left leg gave out and she fell striking her above her left eye knocking out one of her teeth.  No LOC.  She has a mild headache, but no other complaints.  Reported she has been drinking alcohol.  No neck or back pain.  No chest or abdominal pain No focal weakness reported at this time.  Reports her legs give out frequently She reports she is missing a tooth but she left it at home    Past Medical History:  Diagnosis Date   Anxiety    Arthritis    Asthma    Bell's palsy    BV (bacterial vaginosis)    Depression    Diabetes mellitus    HIV disease (HCC)    Hypertension    Obesity     Prior to Admission medications   Medication Sig Start Date End Date Taking? Authorizing Provider  acetaminophen  (TYLENOL ) 325 MG tablet Take 1-2 tablets (325-650 mg total) by mouth every 4 (four) hours as needed for mild pain. 04/24/23   Setzer, Nena PARAS, PA-C  albuterol  (PROVENTIL ) (2.5 MG/3ML) 0.083% nebulizer solution Take 3 mLs (2.5 mg total) by nebulization every 4 (four) hours as needed for wheezing or shortness of breath. 02/14/24   Elgergawy, Brayton RAMAN, MD  aspirin  EC 81 MG tablet Take 1 tablet (81 mg total) by mouth daily. Swallow whole. 04/25/23   Setzer, Nena PARAS, PA-C  bictegravir-emtricitabine -tenofovir  AF (BIKTARVY ) 50-200-25 MG TABS tablet Take 1 tablet by mouth daily. 09/17/23   Comer, Lamar ORN, MD  budesonide -formoterol  (SYMBICORT) 160-4.5 MCG/ACT inhaler Inhale 2 puffs into the lungs 2 (two) times daily.    [provider]  cetirizine (ZYRTEC)  10 MG tablet Take 10 mg by mouth daily.    [provider]  citalopram  (CELEXA ) 20 MG tablet Take 1 tablet (20 mg total) by mouth daily. 04/24/23   Setzer, Nena PARAS, PA-C  clopidogrel  (PLAVIX ) 75 MG tablet Take 75 mg by mouth daily. 09/13/23   [provider]  Continuous Glucose Receiver (DEXCOM G7 RECEIVER) DEVI 1 each by Does not apply route daily. 05/03/23   Raulkar, Sven SQUIBB, MD  Continuous Glucose Sensor (DEXCOM G7 SENSOR) MISC 1 each by Does not apply route daily. 05/03/23   Raulkar, Sven SQUIBB, MD  ferrous sulfate 325 (65 FE) MG tablet Take 325 mg by mouth daily with breakfast.    [provider]  folic acid (FOLVITE) 1 MG tablet Take 1 mg by mouth daily.    [provider]  glipiZIDE  (GLUCOTROL  XL) 10 MG 24 hr tablet Take 10 mg by mouth daily. 09/13/23   [provider]  Insulin  Pen Needle 32G X 4 MM MISC Use as directed 04/24/23     losartan  (COZAAR ) 50 MG tablet Take 1 tablet (50 mg total) by mouth daily. 04/24/23   Setzer, Nena PARAS, PA-C  omeprazole (PRILOSEC) 20 MG capsule Take 20 mg by mouth daily.    [provider]  oseltamivir  (TAMIFLU ) 75 MG capsule Take 1 capsule (75 mg total) by mouth 2 (two)  times daily. 02/14/24   Elgergawy, Brayton RAMAN, MD  oxyCODONE -acetaminophen  (PERCOCET/ROXICET) 5-325 MG tablet Take 1 tablet by mouth every 4 (four) hours as needed for severe pain (pain score 7-10). 04/08/24   Tegeler, Lonni PARAS, MD  topiramate  (TOPAMAX ) 25 MG tablet TAKE ONE TABLET BY MOUTH AT BEDTIME 06/16/24   Raulkar, Sven SQUIBB, MD  VENTOLIN  HFA 108 (90 Base) MCG/ACT inhaler Inhale 1-2 puffs into the lungs every 6 (six) hours as needed for wheezing or shortness of breath. 01/16/24   [provider]    Allergies: Penicillins, Shellfish allergy, and Amoxicillin    Review of Systems  Cardiovascular:  Negative for chest pain.  Gastrointestinal:  Negative for abdominal pain and vomiting.  Neurological:  Positive for headaches.     Updated Vital Signs BP 124/65 (BP Location: Right Arm)   Pulse 65   Temp 98 F (36.7 C) (Oral)   Resp 19   Ht 1.753 m (5' 9)   Wt 113.4 kg   LMP  (LMP Unknown)   SpO2 99%   BMI 36.92 kg/m   Physical Exam CONSTITUTIONAL: Well developed/well nourished HEAD: Swelling above the left eye, no other signs of head trauma EYES: EOMI/PERRL, no proptosis, no conjunctival injection ENMT: Mucous membranes moist Face is stable.  No nasal injury.  No tenderness to the maxilla. Tooth #26 is completely avulsed Poor dentition overall, is missing multiple teeth at baseline SPINE/BACK:entire spine nontender No bruising/crepitance/stepoffs noted to spine CV: S1/S2 noted, no murmurs/rubs/gallops noted LUNGS: Lungs are clear to auscultation bilaterally, no apparent distress ABDOMEN: soft, nontender, no rebound or guarding, bowel sounds noted throughout abdomen GU:no cva tenderness NEURO: Pt is awake/alert/appropriate, moves all extremitiesx4.  No facial droop.  No arm or leg drift EXTREMITIES: pulses normal/equal, full ROM All other extremities/joints palpated/ranged and nontender SKIN: warm, color normal PSYCH: no abnormalities of mood noted, alert and oriented to situation  (all labs ordered are listed, but only abnormal results are displayed) Labs Reviewed  CBC  BASIC METABOLIC PANEL WITH GFR  ETHANOL    EKG: None  Radiology: No results found.  {Document cardiac monitor, telemetry assessment procedure when appropriate:32947} Procedures   Medications Ordered in the ED  HYDROcodone -acetaminophen  (NORCO/VICODIN) 5-325 MG per tablet 1 tablet (has no administration in time range)      {Click here for ABCD2, HEART and other calculators REFRESH Note before signing:1}                              Medical Decision Making Amount and/or Complexity of Data Reviewed Labs: ordered. Radiology: ordered.  Risk Prescription drug management.   This patient presents to the ED for  concern of fall and head trauma, this involves an extensive number of treatment options, and is a complaint that carries with it a high risk of complications and morbidity.  The differential diagnosis includes but is not limited to subdural hematoma, subarachnoid hemorrhage, skull fracture, concussion   Comorbidities that complicate the patient evaluation: Patient's presentation is complicated by their history of HIV, previous CVA  Social Determinants of Health: Patient's former tobacco use, alcohol use  increases the complexity of managing their presentation  Additional history obtained: Records reviewed previous admission documents  Lab Tests: I Ordered, and personally interpreted labs.  The pertinent results include:  ***  Imaging Studies ordered: I ordered imaging studies including CT scan head, face, C-spine  I independently visualized and interpreted imaging which showed *** I agree with  the radiologist interpretation  Cardiac Monitoring: The patient was maintained on a cardiac monitor.  I personally viewed and interpreted the cardiac monitor which showed an underlying rhythm of:  {cardiac monitor:26849}  Medicines ordered and prescription drug management: I ordered medication including Vicodin for pain Reevaluation of the patient after these medicines showed that the patient    {resolved/improved/worsened:23923::improved}  Test Considered: Patient is low risk / negative by ***, therefore do not feel that *** is indicated.  Critical Interventions:  ***  Consultations Obtained: I requested consultation with the {consultation:26851}, and discussed  findings as well as pertinent plan - they recommend: ***  Reevaluation: After the interventions noted above, I reevaluated the patient and found that they have :{resolved/improved/worsened:23923::improved}  Complexity of problems addressed: Patient's presentation is most consistent with  {RNEJ:73156}  Disposition: After  consideration of the diagnostic results and the patient's response to treatment,  I feel that the patent would benefit from {disposition:26850}.     {Document critical care time when appropriate  Document review of labs and clinical decision tools ie CHADS2VASC2, etc  Document your independent review of radiology images and any outside records  Document your discussion with family members, caretakers and with consultants  Document social determinants of health affecting pt's care  Document your decision making why or why not admission, treatments were needed:32947:::1}   Final diagnoses:  None    ED Discharge Orders     None

## 2024-08-02 ENCOUNTER — Emergency Department (HOSPITAL_COMMUNITY)

## 2024-08-02 LAB — BASIC METABOLIC PANEL WITH GFR
Anion gap: 10 (ref 5–15)
BUN: 7 mg/dL (ref 6–20)
CO2: 25 mmol/L (ref 22–32)
Calcium: 8.7 mg/dL — ABNORMAL LOW (ref 8.9–10.3)
Chloride: 105 mmol/L (ref 98–111)
Creatinine, Ser: 0.72 mg/dL (ref 0.44–1.00)
GFR, Estimated: >60 mL/min (ref >60)
Glucose, Bld: 265 mg/dL — ABNORMAL HIGH (ref 70–99)
Potassium: 3.8 mmol/L (ref 3.5–5.1)
Sodium: 140 mmol/L (ref 135–145)

## 2024-08-02 LAB — CBC
HCT: 39.1 % (ref 36.0–46.0)
Hemoglobin: 12.4 g/dL (ref 12.0–15.0)
MCH: 26.4 pg (ref 26.0–34.0)
MCHC: 31.7 g/dL (ref 30.0–36.0)
MCV: 83.2 fL (ref 80.0–100.0)
Platelets: 354 K/uL (ref 150–400)
RBC: 4.7 MIL/uL (ref 3.87–5.11)
RDW: 14.3 % (ref 11.5–15.5)
WBC: 4.6 K/uL (ref 4.0–10.5)
nRBC: 0 % (ref 0.0–0.2)

## 2024-08-02 LAB — ETHANOL: Alcohol, Ethyl (B): 145 mg/dL — ABNORMAL HIGH (ref <15)

## 2024-08-02 MED ORDER — OXYCODONE HCL 5 MG PO TABS
5.0000 mg | ORAL_TABLET | Freq: Once | ORAL | Status: AC
  Administered 2024-08-02: 5 mg via ORAL
  Filled 2024-08-02: qty 1

## 2024-08-02 MED ORDER — HYDROCODONE-ACETAMINOPHEN 5-325 MG PO TABS
1.0000 | ORAL_TABLET | Freq: Once | ORAL | Status: AC
  Administered 2024-08-02: 1 via ORAL
  Filled 2024-08-02: qty 1

## 2024-08-02 NOTE — ED Notes (Signed)
 Patient transported to CT

## 2024-08-02 NOTE — ED Provider Notes (Signed)
 Patient seen walking with a walker in no acute distress appears at her baseline   Midge Golas, MD 08/02/24 [phone]

## 2024-08-14 ENCOUNTER — Other Ambulatory Visit: Payer: Self-pay

## 2024-08-14 DIAGNOSIS — Z113 Encounter for screening for infections with a predominantly sexual mode of transmission: Secondary | ICD-10-CM

## 2024-08-14 DIAGNOSIS — Z21 Asymptomatic human immunodeficiency virus [HIV] infection status: Secondary | ICD-10-CM

## 2024-08-14 DIAGNOSIS — Z79899 Other long term (current) drug therapy: Secondary | ICD-10-CM

## 2024-08-18 ENCOUNTER — Other Ambulatory Visit: Payer: Self-pay

## 2024-08-18 ENCOUNTER — Other Ambulatory Visit

## 2024-08-18 ENCOUNTER — Other Ambulatory Visit (HOSPITAL_COMMUNITY)
Admission: RE | Admit: 2024-08-18 | Discharge: 2024-08-18 | Disposition: A | Discharge status: Home / Self Care | Source: Ambulatory Visit | Attending: Infectious Diseases | Admitting: Infectious Diseases

## 2024-08-18 DIAGNOSIS — Z79899 Other long term (current) drug therapy: Secondary | ICD-10-CM

## 2024-08-18 DIAGNOSIS — Z113 Encounter for screening for infections with a predominantly sexual mode of transmission: Secondary | ICD-10-CM

## 2024-08-18 DIAGNOSIS — Z21 Asymptomatic human immunodeficiency virus [HIV] infection status: Secondary | ICD-10-CM | POA: Insufficient documentation

## 2024-08-19 LAB — T-HELPER CELL (CD4) - (RCID CLINIC ONLY)
CD4 % Helper T Cell: 32 % — ABNORMAL LOW (ref 33–65)
CD4 T Cell Abs: 653 /uL (ref 400–1790)

## 2024-08-19 LAB — URINE CYTOLOGY ANCILLARY ONLY
Chlamydia: NEGATIVE
Comment: NEGATIVE
Comment: NORMAL
Neisseria Gonorrhea: NEGATIVE

## 2024-08-20 LAB — LIPID PANEL
Cholesterol: 185 mg/dL (ref <200)
HDL: 50 mg/dL (ref >=50)
LDL Cholesterol (Calc): 115 mg/dL — ABNORMAL HIGH
Non-HDL Cholesterol (Calc): 135 mg/dL — ABNORMAL HIGH (ref <130)
Total CHOL/HDL Ratio: 3.7 (calc) (ref <5.0)
Triglycerides: 96 mg/dL (ref <150)

## 2024-08-20 LAB — CBC WITH DIFFERENTIAL/PLATELET
Absolute Lymphocytes: 2195 {cells}/uL (ref 850–3900)
Absolute Monocytes: 368 {cells}/uL (ref 200–950)
Basophils Absolute: 49 {cells}/uL (ref 0–200)
Basophils Relative: 1 %
Eosinophils Absolute: 118 {cells}/uL (ref 15–500)
Eosinophils Relative: 2.4 %
HCT: 39.9 % (ref 35.0–45.0)
Hemoglobin: 12.8 g/dL (ref 11.7–15.5)
MCH: 26.2 pg — ABNORMAL LOW (ref 27.0–33.0)
MCHC: 32.1 g/dL (ref 32.0–36.0)
MCV: 81.6 fL (ref 80.0–100.0)
MPV: 9.6 fL (ref 7.5–12.5)
Monocytes Relative: 7.5 %
Neutro Abs: 2171 {cells}/uL (ref 1500–7800)
Neutrophils Relative %: 44.3 %
Platelets: 246 Thousand/uL (ref 140–400)
RBC: 4.89 Million/uL (ref 3.80–5.10)
RDW: 13.5 % (ref 11.0–15.0)
Total Lymphocyte: 44.8 %
WBC: 4.9 Thousand/uL (ref 3.8–10.8)

## 2024-08-20 LAB — COMPLETE METABOLIC PANEL WITHOUT GFR
AG Ratio: 1.1 (calc) (ref 1.0–2.5)
ALT: 9 U/L (ref 6–29)
AST: 11 U/L (ref 10–35)
Albumin: 4 g/dL (ref 3.6–5.1)
Alkaline phosphatase (APISO): 106 U/L (ref 37–153)
BUN: 7 mg/dL (ref 7–25)
CO2: 29 mmol/L (ref 20–32)
Calcium: 9.4 mg/dL (ref 8.6–10.4)
Chloride: 101 mmol/L (ref 98–110)
Creat: 0.62 mg/dL (ref 0.50–1.03)
Globulin: 3.6 g/dL (ref 1.9–3.7)
Glucose, Bld: 169 mg/dL — ABNORMAL HIGH (ref 65–99)
Potassium: 4.2 mmol/L (ref 3.5–5.3)
Sodium: 138 mmol/L (ref 135–146)
Total Bilirubin: 0.4 mg/dL (ref 0.2–1.2)
Total Protein: 7.6 g/dL (ref 6.1–8.1)

## 2024-08-20 LAB — HIV-1 RNA QUANT-NO REFLEX-BLD
HIV 1 RNA Quant: <20 {copies}/mL — AB
HIV-1 RNA Quant, Log: <1.3 {Log_copies}/mL — AB

## 2024-08-20 LAB — RPR: RPR Ser Ql: NONREACTIVE

## 2024-09-01 ENCOUNTER — Ambulatory Visit: Admitting: Infectious Diseases

## 2024-09-01 ENCOUNTER — Ambulatory Visit: Payer: Self-pay | Admitting: Internal Medicine

## 2024-09-03 ENCOUNTER — Ambulatory Visit: Payer: Self-pay | Admitting: Infectious Diseases

## 2024-09-04 ENCOUNTER — Encounter: Payer: Self-pay | Admitting: Infectious Diseases

## 2024-09-04 ENCOUNTER — Other Ambulatory Visit: Payer: Self-pay

## 2024-09-04 ENCOUNTER — Ambulatory Visit: Admitting: Infectious Diseases

## 2024-09-04 VITALS — BP 137/83 | HR 84 | Temp 98.4°F | Wt 267.0 lb

## 2024-09-04 DIAGNOSIS — Z79899 Other long term (current) drug therapy: Secondary | ICD-10-CM

## 2024-09-04 DIAGNOSIS — I10 Essential (primary) hypertension: Secondary | ICD-10-CM | POA: Diagnosis not present

## 2024-09-04 DIAGNOSIS — F419 Anxiety disorder, unspecified: Secondary | ICD-10-CM | POA: Diagnosis not present

## 2024-09-04 DIAGNOSIS — B20 Human immunodeficiency virus [HIV] disease: Secondary | ICD-10-CM | POA: Diagnosis present

## 2024-09-04 DIAGNOSIS — F32A Depression, unspecified: Secondary | ICD-10-CM

## 2024-09-04 DIAGNOSIS — Z0000 Encounter for general adult medical examination without abnormal findings: Secondary | ICD-10-CM | POA: Diagnosis not present

## 2024-09-04 DIAGNOSIS — Z113 Encounter for screening for infections with a predominantly sexual mode of transmission: Secondary | ICD-10-CM

## 2024-09-04 DIAGNOSIS — Z7185 Encounter for immunization safety counseling: Secondary | ICD-10-CM | POA: Diagnosis not present

## 2024-09-04 DIAGNOSIS — E1149 Type 2 diabetes mellitus with other diabetic neurological complication: Secondary | ICD-10-CM

## 2024-09-04 MED ORDER — BIKTARVY 50-200-25 MG PO TABS: 1.0000 | ORAL_TABLET | Freq: Every day | ORAL | 11 refills | Status: AC

## 2024-09-04 NOTE — Progress Notes (Unsigned)
 [address], [place], [place], [address]                                                                  Phn. [phone]; Fax: [phone]                                                                             Date: 09/04/24  Reason for Visit: Routine HIV care.  HPI: Latoya Roth is a 53 y.o.old female with a history of anxiety/depression, arthritis, asthma, HTN, Morbid Obesity,. CVA s/p rt sided weakness, HIV who is here for regular HIV fu. Patient previously followed by Dr Efrain and was last seen on 02/28/24.    Interval hx/current visit: Reports taking Biktarvy  daily without missed doses, and no barriers to adherence of treatment. She consumes alcohol, denies smoking or recreational drug use, and is sexually active with Her partner. She walks with walker due to CVA. Declined flu vaccine. She follow closely with her PCP. Takes cholesterol meds but unable to tell me name. She is going home and let us  know. If not on statin, told her she needs to be on statin and can send refill by us /PCP. No complaints otherwise.   ROS: As stated in above HPI; all other systems were reviewed and are otherwise negative unless noted below  No reported fever / chills, night sweats, unintentional weight loss, acute visual change, odynophagia, chest pain/pressure, new or worsened SOB or WOB, nausea, vomiting, diarrhea, dysuria, GU discharge, syncope, seizures, red/hot swollen joints, hallucinations / delusions, rashes, new allergies, unusual / excessive bleeding, swollen lymph nodes, or new hospitalizations since the pt was last seen.  PMH/ PSH/ FamHx / Social Hx , medications and allergies reviewed and updated as appropriate; please see corresponding tab in EHR / prior notes                                         Current Outpatient Medications on File Prior to Visit  Medication Sig Dispense Refill   acetaminophen  (TYLENOL ) 325 MG tablet Take 1-2 tablets (325-650 mg total) by mouth every 4 (four) hours as needed for mild pain.     albuterol  (PROVENTIL ) (2.5 MG/3ML) 0.083% nebulizer solution Take 3 mLs (2.5 mg total) by nebulization every 4 (four) hours as needed for wheezing or shortness of breath. 90 mL 0   aspirin  EC 81 MG tablet Take 1 tablet (81 mg total) by mouth daily. Swallow whole. 30 tablet 0   budesonide -formoterol  (SYMBICORT) 160-4.5 MCG/ACT inhaler Inhale 2 puffs into  the lungs 2 (two) times daily.     cetirizine (ZYRTEC) 10 MG tablet Take 10 mg by mouth daily.     citalopram  (CELEXA ) 20 MG tablet Take 1 tablet (20 mg total) by mouth daily. 30 tablet 0   clopidogrel  (PLAVIX ) 75 MG tablet Take 75 mg by mouth daily.     Continuous Glucose Receiver (DEXCOM G7 RECEIVER) DEVI 1 each by Does not apply route daily. 1 each 0   Continuous Glucose Sensor (DEXCOM G7 SENSOR) MISC 1 each by Does not apply route daily. 1 each 0   ferrous sulfate 325 (65 FE) MG tablet Take 325 mg by mouth daily with breakfast.     folic acid (FOLVITE) 1 MG tablet Take 1 mg by mouth daily.     glipiZIDE  (GLUCOTROL  XL) 10 MG 24 hr tablet Take 10 mg by mouth daily.     Insulin  Pen Needle 32G X 4 MM MISC Use as directed 100 each 0   losartan  (COZAAR ) 50 MG tablet Take 1 tablet (50 mg total) by mouth daily.     omeprazole (PRILOSEC) 20 MG capsule Take 20 mg by mouth daily.     oseltamivir  (TAMIFLU ) 75 MG capsule Take 1 capsule (75 mg total) by mouth 2 (two) times daily. 3 capsule 0   oxyCODONE -acetaminophen  (PERCOCET/ROXICET) 5-325 MG tablet Take 1 tablet by mouth every 4 (four) hours as needed for severe pain (pain score 7-10). 15 tablet 0   topiramate  (TOPAMAX ) 25 MG tablet TAKE ONE TABLET BY MOUTH AT BEDTIME 90 tablet 2   VENTOLIN  HFA 108 (90 Base) MCG/ACT inhaler Inhale 1-2 puffs into the lungs every 6 (six) hours as  needed for wheezing or shortness of breath.     No current facility-administered medications on file prior to visit.     Allergies  Allergen Reactions   Penicillins Anaphylaxis   Shellfish Allergy Anaphylaxis, Hives and Swelling   Amoxicillin Rash   Past Medical History:  Diagnosis Date   Anxiety    Arthritis    Asthma    Bell's palsy    BV (bacterial vaginosis)    Depression    Diabetes mellitus    HIV disease (HCC)    Hypertension    Obesity    Past Surgical History:  Procedure Laterality Date   CHOLECYSTECTOMY      Vitals  BP 137/83   Pulse 84   Temp 98.4 F (36.9 C) (Oral)   Wt 267 lb (121.1 kg)   LMP  (LMP Unknown)   SpO2 97%   BMI 39.43 kg/m   Examination  Gen: no acute distress, Morbid obesity HEENT: [place]/AT, no scleral icterus, no pale conjunctivae, hearing normal, oral mucosa moist Neck: Supple Cardio: Regular rate and rhythm, S1S2 Resp: Pulmonary effort normal in room air, Normal breath sounds  GI: nondistended GU: Musc: Extremities: No pedal edema Skin: No rashes Neuro:  awake, alert and oriented * 3, Rt sided weakness Psych: Calm, cooperative    Lab Results HIV 1 RNA Quant  Date Value  08/18/2024 <20 DETECTED copies/mL (A)  02/28/2024 27 Copies/mL (H)  09/03/2023 <20 Copies/mL (H)   CD4 T Cell Abs (/uL)  Date Value  08/18/2024 653  09/03/2023 681  09/20/2022 587   Lab Results  Component Value Date   HIV1GENOSEQ REPORT 03/13/2017   Lab Results  Component Value Date   WBC 4.9 08/18/2024   HGB 12.8 08/18/2024   HCT 39.9 08/18/2024   MCV 81.6 08/18/2024   PLT 246 08/18/2024  Lab Results  Component Value Date   CREATININE 0.62 08/18/2024   BUN 7 08/18/2024   NA 138 08/18/2024   K 4.2 08/18/2024   CL 101 08/18/2024   CO2 29 08/18/2024   Lab Results  Component Value Date   ALT 9 08/18/2024   AST 11 08/18/2024   ALKPHOS 67 02/11/2024   BILITOT 0.4 08/18/2024    Lab Results  Component Value Date   CHOL 185  08/18/2024   TRIG 96 08/18/2024   HDL 50 08/18/2024   LDLCALC 115 (H) 08/18/2024   Lab Results  Component Value Date   HAV NON REACTIVE 03/13/2017   Lab Results  Component Value Date   HEPBSAG NEGATIVE 03/13/2017   HEPBSAB NEG 03/13/2017   Lab Results  Component Value Date   HCVAB NEGATIVE 03/13/2017   Lab Results  Component Value Date   CHLAMYDIAWP Negative 08/18/2024   N Negative 08/18/2024   Lab Results  Component Value Date   GCPROBEAPT NEGATIVE 01/15/2014   Lab Results  Component Value Date   QUANTGOLD NEGATIVE 03/13/2017    Health Maintenance: Immunization History  Administered Date(s) Administered   Hepatitis A, Adult 05/06/2017, 12/31/2017   Hepatitis B, ADULT 05/06/2017, 12/31/2017, 06/30/2018   Influenza,inj,Quad PF,6+ Mos 12/31/2017   PFIZER Comirnaty(Gray Top)Covid-19 Tri-Sucrose Vaccine 08/26/2020, 09/16/2020   PNEUMOCOCCAL CONJUGATE-20 04/17/2023   Pneumococcal Conjugate-13 06/30/2018   Assessment/Plan: # HIV - continue Biktarvy , meds refilled  - 8/26 labs reviewed and HIV undetectable  - fu in 5-6 months   # STD Screening  - no acute concerns  - Urine GC and RPR negative   # HTN/DM/Asthma/CVA?morbid Obesity - follows with PCP, management per them - She thinks she is on cholesterol meds but unable to tell name. I told hers he needs to be on statin if not on it and will let us  know  # Anxiety/Depression - stable - on citalopram   # Immunization  - declined flu vaccine    #Health maintenance - discussed to fu with PCP for age based ca screening - pap smear, colonoscopy and mammogram - Dental referral completed.   Patient's labs were reviewed as well as his previous records. Patients questions were addressed and answered. Safe sex counseling done.  I spent 31 minutes involved in face-to-face and non-face-to-face activities for this patient on the day of the visit. Professional time spent includes the following activities: Preparing to  see the patient (review of tests), Obtaining and reviewing separately obtained history (Prior notes from Dr Efrain, Vascular notes 3/10, Last 2 ED notes), Performing a medically appropriate examination and evaluation, Ordering medications/labs, referring and communicating with other health care professionals, Documenting clinical information in the EMR, Independently interpreting results (not separately reported), Communicating results to the patient, Counseling and educating the patient and Care coordination (not separately reported).   Of note, portions of this note may have been created with voice recognition software. While this note has been edited for accuracy, occasional wrong-word or 'sound-a-like' substitutions may have occurred due to the inherent limitations of voice recognition software.   Electronically signed by:  Annalee Orem, MD Infectious Disease Physician [facility] [address] [place], [place] [address] Phone: [phone]  Fax: [phone]

## 2024-09-05 ENCOUNTER — Other Ambulatory Visit: Payer: Self-pay | Admitting: Infectious Diseases

## 2024-09-05 DIAGNOSIS — F32A Depression, unspecified: Secondary | ICD-10-CM | POA: Insufficient documentation

## 2024-09-05 DIAGNOSIS — Z0000 Encounter for general adult medical examination without abnormal findings: Secondary | ICD-10-CM

## 2025-03-04 ENCOUNTER — Ambulatory Visit: Admitting: Infectious Diseases
